# Patient Record
Sex: Male | Born: 1935
Health system: Southern US, Community
[De-identification: ages and names within clinical notes are randomized; demographics above are authoritative.]

## PROBLEM LIST (undated history)

## (undated) DIAGNOSIS — E119 Type 2 diabetes mellitus without complications: Secondary | ICD-10-CM

## (undated) DIAGNOSIS — M1990 Unspecified osteoarthritis, unspecified site: Secondary | ICD-10-CM

## (undated) DIAGNOSIS — R4189 Other symptoms and signs involving cognitive functions and awareness: Secondary | ICD-10-CM

## (undated) DIAGNOSIS — E785 Hyperlipidemia, unspecified: Secondary | ICD-10-CM

## (undated) DIAGNOSIS — I700 Atherosclerosis of aorta: Secondary | ICD-10-CM

## (undated) DIAGNOSIS — I219 Acute myocardial infarction, unspecified: Secondary | ICD-10-CM

## (undated) DIAGNOSIS — I447 Left bundle-branch block, unspecified: Secondary | ICD-10-CM

## (undated) DIAGNOSIS — K219 Gastro-esophageal reflux disease without esophagitis: Secondary | ICD-10-CM

## (undated) DIAGNOSIS — I10 Essential (primary) hypertension: Secondary | ICD-10-CM

## (undated) DIAGNOSIS — J449 Chronic obstructive pulmonary disease, unspecified: Secondary | ICD-10-CM

## (undated) DIAGNOSIS — D509 Iron deficiency anemia, unspecified: Secondary | ICD-10-CM

## (undated) DIAGNOSIS — N183 Chronic kidney disease, stage 3 unspecified: Secondary | ICD-10-CM

## (undated) DIAGNOSIS — I2510 Atherosclerotic heart disease of native coronary artery without angina pectoris: Secondary | ICD-10-CM

## (undated) DIAGNOSIS — I4891 Unspecified atrial fibrillation: Secondary | ICD-10-CM

## (undated) DIAGNOSIS — I4892 Unspecified atrial flutter: Secondary | ICD-10-CM

## (undated) HISTORY — PX: CATARACT EXTRACTION: SUR2

## (undated) HISTORY — PX: CARDIAC CATHETERIZATION: SHX172

## (undated) HISTORY — PX: JOINT REPLACEMENT: SHX530

## (undated) HISTORY — PX: COLONOSCOPY: SHX174

## (undated) HISTORY — PX: EYE SURGERY: SHX253

## (undated) HISTORY — PX: INGUINAL HERNIA REPAIR: SUR1180

---

## 2006-01-18 ENCOUNTER — Inpatient Hospital Stay: Payer: Self-pay | Admitting: Internal Medicine

## 2006-05-29 ENCOUNTER — Other Ambulatory Visit: Payer: Self-pay

## 2006-05-30 ENCOUNTER — Inpatient Hospital Stay: Payer: Self-pay | Admitting: Internal Medicine

## 2007-04-19 ENCOUNTER — Inpatient Hospital Stay: Payer: Self-pay | Admitting: Internal Medicine

## 2007-04-19 ENCOUNTER — Other Ambulatory Visit: Payer: Self-pay

## 2008-02-25 ENCOUNTER — Other Ambulatory Visit: Payer: Self-pay

## 2008-02-25 ENCOUNTER — Ambulatory Visit: Payer: Self-pay | Admitting: Specialist

## 2008-03-03 ENCOUNTER — Inpatient Hospital Stay: Payer: Self-pay | Admitting: Specialist

## 2008-03-30 ENCOUNTER — Encounter: Payer: Self-pay | Admitting: Specialist

## 2009-03-23 ENCOUNTER — Emergency Department: Payer: Self-pay | Admitting: Emergency Medicine

## 2009-06-24 ENCOUNTER — Ambulatory Visit: Payer: Self-pay | Admitting: Gastroenterology

## 2009-10-21 ENCOUNTER — Inpatient Hospital Stay: Payer: Self-pay | Admitting: Internal Medicine

## 2010-04-08 ENCOUNTER — Emergency Department: Payer: Self-pay | Admitting: Unknown Physician Specialty

## 2010-12-18 HISTORY — PX: CORONARY ARTERY BYPASS GRAFT: SHX141

## 2011-03-07 DIAGNOSIS — Z951 Presence of aortocoronary bypass graft: Secondary | ICD-10-CM

## 2011-03-07 HISTORY — DX: Presence of aortocoronary bypass graft: Z95.1

## 2011-04-18 ENCOUNTER — Other Ambulatory Visit: Payer: Self-pay | Admitting: Family Medicine

## 2011-04-18 ENCOUNTER — Emergency Department: Payer: Self-pay

## 2011-05-25 ENCOUNTER — Inpatient Hospital Stay: Payer: Self-pay | Admitting: Internal Medicine

## 2011-09-05 ENCOUNTER — Emergency Department: Payer: Self-pay | Admitting: Emergency Medicine

## 2011-10-17 ENCOUNTER — Other Ambulatory Visit: Payer: Self-pay | Admitting: Gastroenterology

## 2011-10-17 DIAGNOSIS — K625 Hemorrhage of anus and rectum: Secondary | ICD-10-CM

## 2015-01-21 DIAGNOSIS — E1121 Type 2 diabetes mellitus with diabetic nephropathy: Secondary | ICD-10-CM | POA: Diagnosis not present

## 2015-01-21 DIAGNOSIS — I10 Essential (primary) hypertension: Secondary | ICD-10-CM | POA: Diagnosis not present

## 2015-01-28 DIAGNOSIS — E780 Pure hypercholesterolemia: Secondary | ICD-10-CM | POA: Diagnosis not present

## 2015-01-28 DIAGNOSIS — E1121 Type 2 diabetes mellitus with diabetic nephropathy: Secondary | ICD-10-CM | POA: Diagnosis not present

## 2015-01-28 DIAGNOSIS — I10 Essential (primary) hypertension: Secondary | ICD-10-CM | POA: Diagnosis not present

## 2015-01-28 DIAGNOSIS — D649 Anemia, unspecified: Secondary | ICD-10-CM | POA: Diagnosis not present

## 2015-05-10 DIAGNOSIS — E1129 Type 2 diabetes mellitus with other diabetic kidney complication: Secondary | ICD-10-CM | POA: Diagnosis not present

## 2015-05-10 DIAGNOSIS — I10 Essential (primary) hypertension: Secondary | ICD-10-CM | POA: Diagnosis not present

## 2015-05-10 DIAGNOSIS — E780 Pure hypercholesterolemia: Secondary | ICD-10-CM | POA: Diagnosis not present

## 2015-05-10 DIAGNOSIS — D649 Anemia, unspecified: Secondary | ICD-10-CM | POA: Diagnosis not present

## 2015-06-03 DIAGNOSIS — I2510 Atherosclerotic heart disease of native coronary artery without angina pectoris: Secondary | ICD-10-CM | POA: Diagnosis not present

## 2015-06-03 DIAGNOSIS — E1121 Type 2 diabetes mellitus with diabetic nephropathy: Secondary | ICD-10-CM | POA: Diagnosis not present

## 2015-06-03 DIAGNOSIS — I10 Essential (primary) hypertension: Secondary | ICD-10-CM | POA: Diagnosis not present

## 2015-06-03 DIAGNOSIS — I447 Left bundle-branch block, unspecified: Secondary | ICD-10-CM | POA: Diagnosis not present

## 2015-07-26 ENCOUNTER — Encounter: Payer: Self-pay | Admitting: Internal Medicine

## 2015-07-26 ENCOUNTER — Observation Stay
Admission: EM | Admit: 2015-07-26 | Discharge: 2015-07-28 | Disposition: A | Discharge status: Home / Self Care | Payer: Commercial Managed Care - HMO | Attending: Internal Medicine | Admitting: Internal Medicine

## 2015-07-26 DIAGNOSIS — Z951 Presence of aortocoronary bypass graft: Secondary | ICD-10-CM | POA: Diagnosis not present

## 2015-07-26 DIAGNOSIS — I447 Left bundle-branch block, unspecified: Secondary | ICD-10-CM | POA: Insufficient documentation

## 2015-07-26 DIAGNOSIS — Z96653 Presence of artificial knee joint, bilateral: Secondary | ICD-10-CM | POA: Diagnosis not present

## 2015-07-26 DIAGNOSIS — E785 Hyperlipidemia, unspecified: Secondary | ICD-10-CM | POA: Diagnosis not present

## 2015-07-26 DIAGNOSIS — K922 Gastrointestinal hemorrhage, unspecified: Secondary | ICD-10-CM | POA: Insufficient documentation

## 2015-07-26 DIAGNOSIS — Z87891 Personal history of nicotine dependence: Secondary | ICD-10-CM | POA: Diagnosis not present

## 2015-07-26 DIAGNOSIS — Z79899 Other long term (current) drug therapy: Secondary | ICD-10-CM | POA: Diagnosis not present

## 2015-07-26 DIAGNOSIS — I2510 Atherosclerotic heart disease of native coronary artery without angina pectoris: Secondary | ICD-10-CM | POA: Diagnosis not present

## 2015-07-26 DIAGNOSIS — Z7982 Long term (current) use of aspirin: Secondary | ICD-10-CM | POA: Insufficient documentation

## 2015-07-26 DIAGNOSIS — M1990 Unspecified osteoarthritis, unspecified site: Secondary | ICD-10-CM | POA: Insufficient documentation

## 2015-07-26 DIAGNOSIS — Z9849 Cataract extraction status, unspecified eye: Secondary | ICD-10-CM | POA: Diagnosis not present

## 2015-07-26 DIAGNOSIS — E118 Type 2 diabetes mellitus with unspecified complications: Secondary | ICD-10-CM

## 2015-07-26 DIAGNOSIS — E119 Type 2 diabetes mellitus without complications: Secondary | ICD-10-CM | POA: Insufficient documentation

## 2015-07-26 DIAGNOSIS — K648 Other hemorrhoids: Secondary | ICD-10-CM | POA: Diagnosis not present

## 2015-07-26 DIAGNOSIS — Z9889 Other specified postprocedural states: Secondary | ICD-10-CM | POA: Diagnosis not present

## 2015-07-26 DIAGNOSIS — E784 Other hyperlipidemia: Secondary | ICD-10-CM | POA: Diagnosis not present

## 2015-07-26 DIAGNOSIS — K921 Melena: Principal | ICD-10-CM | POA: Diagnosis present

## 2015-07-26 DIAGNOSIS — I10 Essential (primary) hypertension: Secondary | ICD-10-CM | POA: Diagnosis present

## 2015-07-26 HISTORY — DX: Left bundle-branch block, unspecified: I44.7

## 2015-07-26 HISTORY — DX: Hyperlipidemia, unspecified: E78.5

## 2015-07-26 HISTORY — DX: Atherosclerotic heart disease of native coronary artery without angina pectoris: I25.10

## 2015-07-26 HISTORY — DX: Essential (primary) hypertension: I10

## 2015-07-26 HISTORY — DX: Type 2 diabetes mellitus without complications: E11.9

## 2015-07-26 HISTORY — DX: Unspecified osteoarthritis, unspecified site: M19.90

## 2015-07-26 LAB — CBC
HCT: 35.8 % — ABNORMAL LOW (ref 40.0–52.0)
Hemoglobin: 11.8 g/dL — ABNORMAL LOW (ref 13.0–18.0)
MCH: 30.4 pg (ref 26.0–34.0)
MCHC: 33.1 g/dL (ref 32.0–36.0)
MCV: 91.9 fL (ref 80.0–100.0)
PLATELETS: 201 10*3/uL (ref 150–440)
RBC: 3.89 MIL/uL — ABNORMAL LOW (ref 4.40–5.90)
RDW: 14 % (ref 11.5–14.5)
WBC: 7.9 10*3/uL (ref 3.8–10.6)

## 2015-07-26 LAB — TYPE AND SCREEN
ABO/RH(D): O POS
Antibody Screen: NEGATIVE

## 2015-07-26 LAB — COMPREHENSIVE METABOLIC PANEL
ALT: 11 U/L — ABNORMAL LOW (ref 17–63)
ANION GAP: 10 (ref 5–15)
AST: 17 U/L (ref 15–41)
Albumin: 4.7 g/dL (ref 3.5–5.0)
Alkaline Phosphatase: 46 U/L (ref 38–126)
BUN: 23 mg/dL — AB (ref 6–20)
CHLORIDE: 102 mmol/L (ref 101–111)
CO2: 29 mmol/L (ref 22–32)
Calcium: 9.3 mg/dL (ref 8.9–10.3)
Creatinine, Ser: 1.09 mg/dL (ref 0.61–1.24)
GFR calc Af Amer: >60 mL/min (ref >60)
GFR calc non Af Amer: >60 mL/min (ref >60)
GLUCOSE: 121 mg/dL — AB (ref 65–99)
Potassium: 3.9 mmol/L (ref 3.5–5.1)
SODIUM: 141 mmol/L (ref 135–145)
TOTAL PROTEIN: 7.7 g/dL (ref 6.5–8.1)
Total Bilirubin: 0.2 mg/dL — ABNORMAL LOW (ref 0.3–1.2)

## 2015-07-26 LAB — GLUCOSE, CAPILLARY: GLUCOSE-CAPILLARY: 116 mg/dL — AB (ref 65–99)

## 2015-07-26 MED ORDER — ACETAMINOPHEN 650 MG RE SUPP
650.0000 mg | Freq: Four times a day (QID) | RECTAL | Status: DC | PRN
Start: 2015-07-26 — End: 2015-07-28

## 2015-07-26 MED ORDER — ONDANSETRON HCL 4 MG/2ML IJ SOLN
4.0000 mg | Freq: Four times a day (QID) | INTRAMUSCULAR | Status: DC | PRN
Start: 2015-07-26 — End: 2015-07-28

## 2015-07-26 MED ORDER — ONDANSETRON HCL 4 MG PO TABS
4.0000 mg | ORAL_TABLET | Freq: Four times a day (QID) | ORAL | Status: DC | PRN
Start: 2015-07-26 — End: 2015-07-28

## 2015-07-26 MED ORDER — INSULIN ASPART 100 UNIT/ML ~~LOC~~ SOLN
0.0000 [IU] | Freq: Four times a day (QID) | SUBCUTANEOUS | Status: DC
  Administered 2015-07-27: 1 [IU] via SUBCUTANEOUS
  Administered 2015-07-28: 3 [IU] via SUBCUTANEOUS
  Filled 2015-07-26: qty 1
  Filled 2015-07-26: qty 3

## 2015-07-26 MED ORDER — PANTOPRAZOLE SODIUM 40 MG IV SOLR
40.0000 mg | Freq: Two times a day (BID) | INTRAVENOUS | Status: DC
  Administered 2015-07-26 – 2015-07-28 (×4): 40 mg via INTRAVENOUS
  Filled 2015-07-26 (×4): qty 40

## 2015-07-26 MED ORDER — SODIUM CHLORIDE 0.9 % IV SOLN
INTRAVENOUS | Status: AC
  Administered 2015-07-26: 23:00:00 via INTRAVENOUS

## 2015-07-26 MED ORDER — ACETAMINOPHEN 325 MG PO TABS: 650.0000 mg | ORAL_TABLET | Freq: Four times a day (QID) | ORAL | Status: DC | PRN

## 2015-07-26 NOTE — H&P (Signed)
[facility] [place] NAME: Michael Simon    MR#:  [id number]  DATE OF BIRTH:  04/14/1933  DATE OF ADMISSION:  07/26/2015  PRIMARY CARE PHYSICIAN: Glendon Axe, MD   REQUESTING/REFERRING PHYSICIAN: Clearnce Hasten, MD  CHIEF COMPLAINT:   Chief Complaint  Patient presents with  . Rectal Bleeding    HISTORY OF PRESENT ILLNESS:  Michael Simon  is a 81 y.o. male who presents with hematochezia. Patient states that he had an episode of a small amount of red blood per rectum yesterday, followed by a large bowel movement today of mixed stool and bright red blood. He denies abdominal pain, fever/chills, nausea/vomiting, and states that his diarrhea just started today. He denies any prior diagnosis of diverticulosis. He denies any other symptoms like lightheadedness, see full review of systems below. He did not try and take anything for this problem, and came straight to the ED for evaluation. In the ED he was found to have a stable hemoglobin, vital signs stable, hemodynamically stable. Hospitals were called for admission for hematochezia.  PAST MEDICAL HISTORY:   Past Medical History  Diagnosis Date  . CAD (coronary artery disease)   . Type II diabetes mellitus   . HTN (hypertension)   . HLD (hyperlipidemia)   . Osteoarthritis   . LBBB (left bundle branch block)     PAST SURGICAL HISTORY:   Past Surgical History  Procedure Laterality Date  . Joint replacement      knees  . Coronary artery bypass graft  2012  . Cataract extraction    . Inguinal hernia repair      SOCIAL HISTORY:   History  Substance Use Topics  . Smoking status: Former Smoker    Quit date: 03/03/2008  . Smokeless tobacco: Not on file  . Alcohol Use: No    FAMILY HISTORY:   Family History  Problem Relation Age of Onset  . Arthritis Mother   . Heart disease Mother   . Heart disease Other     DRUG ALLERGIES:  No Known Allergies  MEDICATIONS AT  HOME:   Prior to Admission medications   Medication Sig Start Date End Date Taking? Authorizing Provider  aspirin EC 81 MG tablet Take 81 mg by mouth daily.   Yes Historical Provider, MD  Cyanocobalamin (VITAMIN B-12 CR) 1500 MCG TBCR Take 1 tablet by mouth daily.   Yes Historical Provider, MD  iron polysaccharides (NIFEREX) 150 MG capsule Take 150 mg by mouth daily.   Yes Historical Provider, MD  lisinopril (PRINIVIL,ZESTRIL) 20 MG tablet Take 20 mg by mouth daily.   Yes Historical Provider, MD  lovastatin (MEVACOR) 40 MG tablet Take 40 mg by mouth at bedtime.   Yes Historical Provider, MD  metFORMIN (GLUCOPHAGE-XR) 500 MG 24 hr tablet Take 1,000 mg by mouth 2 (two) times daily.    Yes Historical Provider, MD  metoprolol tartrate (LOPRESSOR) 25 MG tablet Take 12.5 mg by mouth 2 (two) times daily.   Yes Historical Provider, MD    REVIEW OF SYSTEMS:  Review of Systems  Constitutional: Negative for fever, chills, weight loss and malaise/fatigue.  HENT: Negative for ear pain, hearing loss and tinnitus.   Eyes: Negative for blurred vision, double vision, pain and redness.  Respiratory: Negative for cough, hemoptysis and shortness of breath.   Cardiovascular: Negative for chest pain, palpitations, orthopnea and leg swelling.  Gastrointestinal: Positive for diarrhea and blood in stool. Negative for nausea, vomiting, abdominal pain and  constipation.  Genitourinary: Negative for dysuria, frequency and hematuria.  Musculoskeletal: Negative for back pain, joint pain and neck pain.  Skin:       No acne, rash, or lesions  Neurological: Negative for dizziness, tremors, focal weakness and weakness.  Endo/Heme/Allergies: Negative for polydipsia. Does not bruise/bleed easily.  Psychiatric/Behavioral: Negative for depression. The patient is not nervous/anxious and does not have insomnia.      VITAL SIGNS:   Filed Vitals:   07/26/15 1953  BP: 119/72  Pulse: 81  Temp: 98.2 F (36.8 C)  TempSrc:  Oral  Resp: 18  Height: 5\' 9"  (1.753 m)  Weight: 77.111 kg (170 lb)  SpO2: 97%   Wt Readings from Last 3 Encounters:  07/26/15 77.111 kg (170 lb)    PHYSICAL EXAMINATION:  Physical Exam  Vitals reviewed. Constitutional: He is oriented to person, place, and time. He appears well-developed and well-nourished. No distress.  HENT:  Head: Normocephalic and atraumatic.  Mouth/Throat: Oropharynx is clear and moist.  Eyes: Conjunctivae and EOM are normal. Pupils are equal, round, and reactive to light. No scleral icterus.  Neck: Normal range of motion. Neck supple. No JVD present. No thyromegaly present.  Cardiovascular: Normal rate, regular rhythm and intact distal pulses.  Exam reveals no gallop and no friction rub.   No murmur heard. Respiratory: Effort normal and breath sounds normal. No respiratory distress. He has no wheezes. He has no rales.  GI: Soft. Bowel sounds are normal. He exhibits no distension. There is no tenderness.  Musculoskeletal: Normal range of motion. He exhibits no edema.  No arthritis, no gout  Lymphadenopathy:    He has no cervical adenopathy.  Neurological: He is alert and oriented to person, place, and time. No cranial nerve deficit.  No dysarthria, no aphasia  Skin: Skin is warm and dry. No rash noted. No erythema.  Psychiatric: He has a normal mood and affect. His behavior is normal. Judgment and thought content normal.    LABORATORY PANEL:   CBC  Recent Labs Lab 07/26/15 1956  WBC 7.9  HGB 11.8*  HCT 35.8*  PLT 201   ------------------------------------------------------------------------------------------------------------------  Chemistries   Recent Labs Lab 07/26/15 1956  NA 141  K 3.9  CL 102  CO2 29  GLUCOSE 121*  BUN 23*  CREATININE 1.09  CALCIUM 9.3  AST 17  ALT 11*  ALKPHOS 46  BILITOT 0.2*   ------------------------------------------------------------------------------------------------------------------  Cardiac  Enzymes No results for input(s): TROPONINI in the last 168 hours. ------------------------------------------------------------------------------------------------------------------  RADIOLOGY:  No results found.  EKG:   Orders placed or performed in visit on 04/18/11  . EKG 12-Lead    IMPRESSION AND PLAN:  Principal Problem:   Hematochezia - unclear source, though suspect lower GI bleed. Hemodynamically stable, hemoglobin stable. We'll get a GI consult for the morning, keep him nothing by mouth at this time, give him twice a day IV PPI. Active Problems:   Diabetes mellitus type II, controlled - hold his home oral anti-glycemic medication, and implement sliding scale insulin with appropriate corresponding glucose checks every 6 hours while nothing by mouth.   HTN (hypertension) - holding home by mouth antihypertensives this patient is nothing by mouth for now, blood pressure is controlled at this time, can use IV when necessary antihypertensives if this becomes necessary.   HLD (hyperlipidemia) - holding home meds at this time, will restart statin once patient is taking by mouth again   CAD (coronary artery disease) - restart home by mouth medicines when patient  is eating  All the records are reviewed and case discussed with ED provider. Management plans discussed with the patient and/or family.  DVT PROPHYLAXIS: mechanical only  ADMISSION STATUS: Observation  CODE STATUS: full  TOTAL TIME TAKING CARE OF THIS PATIENT: 40 minutes.    Michael Simon 07/26/2015, 9:23 PM  [facility]  Office  [phone]  CC: Primary care physician; Teodoro Spray., MD

## 2015-07-26 NOTE — ED Provider Notes (Addendum)
[facility] Emergency Department Provider Note  ____________________________________________  Time seen: Approximately 8:40 PM  I have reviewed the triage vital signs and the nursing notes.   HISTORY  Chief Complaint Rectal Bleeding    HPI Michael Simon is a 81 y.o. male with a history of coronary artery disease and type 2 diabetes who presents today with 2 days of blood in his stool. He said he had one episode yesterday and then another, very large episode of bleeding about 2 hours ago today. He has not had any pain, shortness of breath, nausea vomiting. He has never been diagnosed with diverticulosis or diverticulitis.   Past Medical History  Diagnosis Date  . CAD (coronary artery disease)   . Type II diabetes mellitus   . HTN (hypertension)   . HLD (hyperlipidemia)   . Osteoarthritis   . LBBB (left bundle branch block)     There are no active problems to display for this patient.   No past surgical history on file.  No current outpatient prescriptions on file.  Allergies Review of patient's allergies indicates no known allergies.  No family history on file.  Social History History  Substance Use Topics  . Smoking status: Not on file  . Smokeless tobacco: Not on file  . Alcohol Use: Not on file    Review of Systems Constitutional: No fever/chills Eyes: No visual changes. ENT: No sore throat. Cardiovascular: Denies chest pain. Respiratory: Denies shortness of breath. Gastrointestinal: No abdominal pain.  No nausea, no vomiting.   No constipation. Genitourinary: Negative for dysuria. Musculoskeletal: Negative for back pain. Skin: Negative for rash. Neurological: Negative for headaches, focal weakness or numbness.  10-point ROS otherwise negative.  ____________________________________________   PHYSICAL EXAM:  VITAL SIGNS: ED Triage Vitals  Enc Vitals Group     BP 07/26/15 1953 119/72 mmHg     Pulse Rate 07/26/15 1953  81     Resp 07/26/15 1953 18     Temp 07/26/15 1953 98.2 F (36.8 C)     Temp Source 07/26/15 1953 Oral     SpO2 07/26/15 1953 97 %     Weight 07/26/15 1953 170 lb (77.111 kg)     Height 07/26/15 1953 5\' 9"  (1.753 m)     Head Cir --      Peak Flow --      Pain Score --      Pain Loc --      Pain Edu? --      Excl. in [place]? --     Constitutional: Alert and oriented. Well appearing and in no acute distress. Eyes: Conjunctivae are normal. PERRL. EOMI. Head: Atraumatic. Nose: No congestion/rhinnorhea. Mouth/Throat: Mucous membranes are moist.  Oropharynx non-erythematous. Neck: No stridor.   Cardiovascular: Normal rate, regular rhythm. Grossly normal heart sounds.  Good peripheral circulation. Respiratory: Normal respiratory effort.  No retractions. Lungs CTAB. Gastrointestinal: Soft and nontender. No distention. No abdominal bruits. No CVA tenderness. Hematochezia on rectal exam. Musculoskeletal: No lower extremity tenderness nor edema.  No joint effusions. Neurologic:  Normal speech and language. No gross focal neurologic deficits are appreciated. No gait instability. Skin:  Skin is warm, dry and intact. No rash noted. Psychiatric: Mood and affect are normal. Speech and behavior are normal.  ____________________________________________   LABS (all labs ordered are listed, but only abnormal results are displayed)  Labs Reviewed  COMPREHENSIVE METABOLIC PANEL - Abnormal; Notable for the following:    Glucose, Bld 121 (*)    BUN 23 (*)  ALT 11 (*)    Total Bilirubin 0.2 (*)    All other components within normal limits  CBC - Abnormal; Notable for the following:    RBC 3.89 (*)    Hemoglobin 11.8 (*)    HCT 35.8 (*)    All other components within normal limits  TYPE AND SCREEN    ____________________________________________  EKG   ____________________________________________  RADIOLOGY   ____________________________________________   PROCEDURES    ____________________________________________   INITIAL IMPRESSION / ASSESSMENT AND PLAN / ED COURSE  Pertinent labs & imaging results that were available during my care of the patient were reviewed by me and considered in my medical decision making (see chart for details).  Given age as well as hematochezia, suspect that this is a diverticular bleed. We'll admit to the hospital. Unlikely active bleeding given only one episode today 2 hours ago. Signed out to Dr. Jannifer Franklin. ____________________________________________   FINAL CLINICAL IMPRESSION(S) / ED DIAGNOSES  Acute GI bleed. Initial visit.    Orbie Pyo, MD 07/26/15 2052  Only anticoagulant is aspirin.  Orbie Pyo, MD 07/26/15 2053

## 2015-07-26 NOTE — ED Notes (Addendum)
Patient ambulatory to triage with steady gait, without difficulty or distress noted; pt reports rectal bleeding since yesterday (x 1 yesterday, x 1 today); denies hx of same; denies abd pain

## 2015-07-27 DIAGNOSIS — I2510 Atherosclerotic heart disease of native coronary artery without angina pectoris: Secondary | ICD-10-CM | POA: Diagnosis not present

## 2015-07-27 DIAGNOSIS — E119 Type 2 diabetes mellitus without complications: Secondary | ICD-10-CM | POA: Diagnosis not present

## 2015-07-27 DIAGNOSIS — K921 Melena: Secondary | ICD-10-CM | POA: Diagnosis not present

## 2015-07-27 DIAGNOSIS — Z7982 Long term (current) use of aspirin: Secondary | ICD-10-CM | POA: Diagnosis not present

## 2015-07-27 DIAGNOSIS — K648 Other hemorrhoids: Secondary | ICD-10-CM | POA: Diagnosis not present

## 2015-07-27 DIAGNOSIS — K922 Gastrointestinal hemorrhage, unspecified: Secondary | ICD-10-CM | POA: Diagnosis not present

## 2015-07-27 DIAGNOSIS — E785 Hyperlipidemia, unspecified: Secondary | ICD-10-CM | POA: Diagnosis not present

## 2015-07-27 DIAGNOSIS — I10 Essential (primary) hypertension: Secondary | ICD-10-CM | POA: Diagnosis not present

## 2015-07-27 DIAGNOSIS — Z87891 Personal history of nicotine dependence: Secondary | ICD-10-CM | POA: Diagnosis not present

## 2015-07-27 DIAGNOSIS — K5730 Diverticulosis of large intestine without perforation or abscess without bleeding: Secondary | ICD-10-CM | POA: Diagnosis not present

## 2015-07-27 LAB — BASIC METABOLIC PANEL
Anion gap: 6 (ref 5–15)
BUN: 21 mg/dL — ABNORMAL HIGH (ref 6–20)
CHLORIDE: 106 mmol/L (ref 101–111)
CO2: 29 mmol/L (ref 22–32)
Calcium: 8.7 mg/dL — ABNORMAL LOW (ref 8.9–10.3)
Creatinine, Ser: 1.04 mg/dL (ref 0.61–1.24)
GFR calc non Af Amer: >60 mL/min (ref >60)
GLUCOSE: 126 mg/dL — AB (ref 65–99)
Potassium: 3.8 mmol/L (ref 3.5–5.1)
Sodium: 141 mmol/L (ref 135–145)

## 2015-07-27 LAB — CBC
HEMATOCRIT: 32.5 % — AB (ref 40.0–52.0)
Hemoglobin: 10.6 g/dL — ABNORMAL LOW (ref 13.0–18.0)
MCH: 30.1 pg (ref 26.0–34.0)
MCHC: 32.7 g/dL (ref 32.0–36.0)
MCV: 91.8 fL (ref 80.0–100.0)
Platelets: 169 10*3/uL (ref 150–440)
RBC: 3.54 MIL/uL — ABNORMAL LOW (ref 4.40–5.90)
RDW: 14 % (ref 11.5–14.5)
WBC: 5.4 10*3/uL (ref 3.8–10.6)

## 2015-07-27 LAB — ABO/RH: ABO/RH(D): O POS

## 2015-07-27 LAB — GLUCOSE, CAPILLARY
GLUCOSE-CAPILLARY: 100 mg/dL — AB (ref 65–99)
GLUCOSE-CAPILLARY: 185 mg/dL — AB (ref 65–99)
GLUCOSE-CAPILLARY: 87 mg/dL (ref 65–99)
Glucose-Capillary: 137 mg/dL — ABNORMAL HIGH (ref 65–99)

## 2015-07-27 LAB — HEMOGLOBIN: Hemoglobin: 10.8 g/dL — ABNORMAL LOW (ref 13.0–18.0)

## 2015-07-27 NOTE — Consult Note (Signed)
GI Inpatient Consult Note  Reason for Consult: Hematochezia   Attending Requesting Consult: Willis  History of Present Illness: Michael Simon is a 81 y.o. male with a h/o CAD and DM type 2 admitted for hematochezia.  He presented to the [facility] ED with BRB in stool x 2 days, with one "very large" episode last evening.  Hematochezia was also noted on rectal exam.  He denied lightheadedness, dizziness, abdominal pain, CP, SOB, and n/v.  Patient did endorse diarrhea.  In the ED, Hgb and vitals were stable.  He was started on IV Protonix 40mg  q 12 hrs, made NPO, and admitted for further evaluation.  Patient has experienced BRBPR before, evaluated twice by Dr. Gustavo Lah and Dr. Vira Agar after hospitalization in 05/2011 for anemia/hematochezia. Hgb rebounded quickly and symptoms resolved so thought to be diverticular on both occasions.  Today, Michael Simon reports improvement in hematochezia.  He reports multiple BMs today, each with less BRB than the previous.  He denies any further episodes of significant hematochezia, like what prompted him to go to the ED last night. He denies any other symptoms including abdominal pain, nausea, vomiting, diarrhea, or constipation.  His appetite and weight are stable, and he does not use NSAIDs, EtOH, or tobacco.  Patient notes that this episode seems similar to what he experienced in 2012 when he saw Dr. Vira Agar and Gustavo Lah, which seemed to be related to diverticulosis.  He states he feels well and would be happy to follow-up outpatient with the improvement he has seen today.  Last EGD: none prior Last colonoscopy: 06/24/09 - diverticulosis, internal hemorrhoids   Past Medical History:  Past Medical History  Diagnosis Date  . CAD (coronary artery disease)   . Type II diabetes mellitus   . HTN (hypertension)   . HLD (hyperlipidemia)   . Osteoarthritis   . LBBB (left bundle branch block)     Problem List: Patient Active Problem List   Diagnosis Date Noted   . Hematochezia 07/26/2015  . Diabetes mellitus type II, controlled 07/26/2015  . HTN (hypertension) 07/26/2015  . HLD (hyperlipidemia) 07/26/2015  . CAD (coronary artery disease) 07/26/2015    Past Surgical History: Past Surgical History  Procedure Laterality Date  . Joint replacement      knees  . Coronary artery bypass graft  2012  . Cataract extraction    . Inguinal hernia repair      Allergies: No Known Allergies  Home Medications: Prescriptions prior to admission  Medication Sig Dispense Refill Last Dose  . aspirin EC 81 MG tablet Take 81 mg by mouth daily.   07/26/2015 at Unknown time  . Cyanocobalamin (VITAMIN B-12 CR) 1500 MCG TBCR Take 1 tablet by mouth daily.   07/26/2015 at Unknown time  . iron polysaccharides (NIFEREX) 150 MG capsule Take 150 mg by mouth daily.   07/26/2015 at Unknown time  . lisinopril (PRINIVIL,ZESTRIL) 20 MG tablet Take 20 mg by mouth daily.   07/26/2015 at Unknown time  . lovastatin (MEVACOR) 40 MG tablet Take 40 mg by mouth at bedtime.   07/25/2015 at Unknown time  . metFORMIN (GLUCOPHAGE-XR) 500 MG 24 hr tablet Take 1,000 mg by mouth 2 (two) times daily.    07/26/2015 at Unknown time  . metoprolol tartrate (LOPRESSOR) 25 MG tablet Take 12.5 mg by mouth 2 (two) times daily.   07/26/2015 at 0600   Home medication reconciliation was completed with the patient.   Scheduled Inpatient Medications:   . insulin aspart  0-9 Units  Subcutaneous Q6H  . pantoprazole (PROTONIX) IV  40 mg Intravenous Q12H    Continuous Inpatient Infusions:     PRN Inpatient Medications:  acetaminophen **OR** acetaminophen, ondansetron **OR** ondansetron (ZOFRAN) IV  Family History: family history includes Arthritis in his mother; Heart disease in his mother and other.    Social History:   reports that he quit smoking about 7 years ago. His smokeless tobacco use includes Chew. He reports that he does not drink alcohol or use illicit drugs.   Review of Systems: Constitutional:  Weight is stable.  Eyes: No changes in vision. ENT: No oral lesions, sore throat.  GI: see HPI.  Heme/Lymph: No easy bruising.  CV: No chest pain.  GU: No hematuria.  Integumentary: No rashes.  Neuro: No headaches.  Psych: No depression/anxiety.  Endocrine: No heat/cold intolerance.  Allergic/Immunologic: No urticaria.  Resp: No cough, SOB.  Musculoskeletal: No joint swelling.    Physical Examination: BP 150/61 mmHg  Pulse 62  Temp(Src) 98.6 F (37 C) (Oral)  Resp 18  Ht 5\' 9"  (1.753 m)  Wt 73.12 kg (161 lb 3.2 oz)  BMI 23.79 kg/m2  SpO2 96% Gen: NAD, alert and oriented x 4 HEENT: PEERLA, EOMI, Neck: supple, no JVD or thyromegaly Chest: CTA bilaterally, no wheezes, crackles, or other adventitious sounds CV: RRR, no m/g/c/r Abd: soft, NT, ND, +BS in all four quadrants; no HSM, guarding, ridigity, or rebound tenderness Ext: no edema, well perfused with 2+ pulses, Skin: no rash or lesions noted Lymph: no LAD  Data: Lab Results  Component Value Date   WBC 5.4 07/27/2015   HGB 10.6* 07/27/2015   HCT 32.5* 07/27/2015   MCV 91.8 07/27/2015   PLT 169 07/27/2015    Recent Labs Lab 07/26/15 1956 07/27/15 0054 07/27/15 0442  HGB 11.8* 10.8* 10.6*   Lab Results  Component Value Date   NA 141 07/27/2015   K 3.8 07/27/2015   CL 106 07/27/2015   CO2 29 07/27/2015   BUN 21* 07/27/2015   CREATININE 1.04 07/27/2015   Lab Results  Component Value Date   ALT 11* 07/26/2015   AST 17 07/26/2015   ALKPHOS 46 07/26/2015   BILITOT 0.2* 07/26/2015   No results for input(s): APTT, INR, PTT in the last 168 hours. Assessment/Plan: Michael Simon is a 81 y.o. male with a h/o CAD and DM type 2 admitted for hematochezia.  He has a history of episodic BRBPR, last colonoscopy in 2010 showed diverticulosis. Patient reports decreasing hematochezia with each BM and remains hemodynamically stable.  Last Hgb 10.6, 11.8 on admission but patient endorses large volume of BRBPR just before  going to the ED last night.  If patient continues to improve symptomatically and vitals/Hgb remain stable, I feel comfortable recommending outpatient follow-up for colonoscopy.  It has been 6 years since his last colonoscopy, so it is reasonable to r/o malignancy and re-evaluate internal hemorrhoids.  Recommendations:  1. Lower GI Bleed - Follow-up with [facility] for outpatient colonoscopy - Continue NPO  - Follow Hgb and hemodynamics closely, transfuse if Hgb > 7 - D/c IV Protonix  Thank you for the consult. We will follow along with you. Please call with questions or concerns.  Lavera Guise, PA-C [facility] Phone: [phone] Pager: [phone]

## 2015-07-27 NOTE — Plan of Care (Signed)
Problem: Discharge Progression Outcomes Goal: Discharge plan in place and appropriate Individualization of care  Outcome: Progressing Individualization of Care Pt prefers to be called Michael Simon Hx of CAD, DM2, HTN, HLD, Osteoarthritis, LBB     Goal: Other Discharge Outcomes/Goals 1. Discharge Plan:  Plan to discharge tomorrow 8/10 if Hgb stable and no further bleeding episodes.   2.  Pain:  Patient denies pain this shift. 3.  Hemodynamics:  Patient afebrile and vss this shift. BP 134/55 mmHg  Pulse 52  Temp(Src) 97.7 F (36.5 C) (Oral)  Resp 20  Ht 5\' 9"  (1.753 m)  Wt 161 lb 3.2 oz (73.12 kg)  BMI 23.79 kg/m2  SpO2 100% 4.  Complications:  Patient NPO with CBG of 87 this shift.  Novolog not given at hs per sliding scale. 5.  Diet:  Patient previously on liquid diet and tolerated well.  Currently NPO and denies hunger or N/V. 6.  Activity:  Patient in chair and then bed this shift.  Able to walk with steady gait and 1 person assist.

## 2015-07-27 NOTE — Plan of Care (Signed)
Problem: Discharge Progression Outcomes Goal: Discharge plan in place and appropriate Individualization of care  Lives at home. Has history of diabetes, HTN and HLD controlled by home medications. On moderate fall precautions per policy.    Goal: Other Discharge Outcomes/Goals Plan of care progress to goal for: rectal bleed - Continues on IV fluids. - Continues IV protonix. - Assist to BR. - No complaints of pain. Will continue to monitor.

## 2015-07-27 NOTE — Progress Notes (Signed)
I have seen and examined Michael Simon and agree with Sharyn Lull Johnson's a/p.   The bleed has now resolved.  This was likely diverticular bleed.   [place] for discharge if Hgb stable in am and on further bleeding.   Will have him f/u in GI clinic and will likely schedule colonoscopy at that time since his last colonoscopy was 5 years ago.

## 2015-07-27 NOTE — Progress Notes (Signed)
Subjective: Admitted for bright red bleeding per rectum. No further episodes of bleeding per rectum since admission. The complaint of abdominal pain, nausea or vomiting.  Objective: Vital signs in last 24 hours: Temp:  [98.1 F (36.7 C)-98.6 F (37 C)] 98.1 F (36.7 C) (08/09 1252) Pulse Rate:  [58-81] 58 (08/09 1252) Resp:  [16-18] 18 (08/09 0452) BP: (119-150)/(61-72) 129/61 mmHg (08/09 1252) SpO2:  [96 %-100 %] 100 % (08/09 1252) Weight:  [73.12 kg (161 lb 3.2 oz)-77.111 kg (170 lb)] 73.12 kg (161 lb 3.2 oz) (08/08 2239) Weight change:  Last BM Date: 07/26/15  Intake/Output from previous day: 08/08 0701 - 08/09 0700 In: 481.3 [I.V.:481.3] Out: -  Intake/Output this shift:    Exam: General: alert, oriented x 3, NAD HENT: no JVD, no pallor, no icterus Chest: clear to auscultation CVS: RRR, S1S2, no tachycardia. Abdomen: soft, nontender, no hepatosplenomegaly Ext: no lower leg edema.  Neuro: Moving all extremities.  Lab Results:  Recent Labs  07/26/15 1956 07/27/15 0054 07/27/15 0442  WBC 7.9  --  5.4  HGB 11.8* 10.8* 10.6*  HCT 35.8*  --  32.5*  PLT 201  --  169   BMET  Recent Labs  07/26/15 1956 07/27/15 0442  NA 141 141  K 3.9 3.8  CL 102 106  CO2 29 29  GLUCOSE 121* 126*  BUN 23* 21*  CREATININE 1.09 1.04  CALCIUM 9.3 8.7*    Studies/Results: No results found.  Medications:  Scheduled Meds: . insulin aspart  0-9 Units Subcutaneous Q6H  . pantoprazole (PROTONIX) IV  40 mg Intravenous Q12H   Continuous Infusions:  PRN Meds:.acetaminophen **OR** acetaminophen, ondansetron **OR** ondansetron (ZOFRAN) IV   Assessment/Plan: Lower GI bleed: monitor for active bleeding, repeat hemoglobin and hematocrit, GI evaluation, continue IV Protonix, start clear liquid diet. Type 2 diabetes: Continue sliding scale insulin coverage. Hold oral hypoglycemic for now. Hypertension is controlled.   Alasia Enge 07/27/2015, 2:33 PM

## 2015-07-28 DIAGNOSIS — Z87891 Personal history of nicotine dependence: Secondary | ICD-10-CM | POA: Diagnosis not present

## 2015-07-28 DIAGNOSIS — E785 Hyperlipidemia, unspecified: Secondary | ICD-10-CM | POA: Diagnosis not present

## 2015-07-28 DIAGNOSIS — K922 Gastrointestinal hemorrhage, unspecified: Secondary | ICD-10-CM | POA: Diagnosis not present

## 2015-07-28 DIAGNOSIS — K921 Melena: Secondary | ICD-10-CM | POA: Diagnosis not present

## 2015-07-28 DIAGNOSIS — Z7982 Long term (current) use of aspirin: Secondary | ICD-10-CM | POA: Diagnosis not present

## 2015-07-28 DIAGNOSIS — K648 Other hemorrhoids: Secondary | ICD-10-CM | POA: Diagnosis not present

## 2015-07-28 DIAGNOSIS — I10 Essential (primary) hypertension: Secondary | ICD-10-CM | POA: Diagnosis not present

## 2015-07-28 DIAGNOSIS — E119 Type 2 diabetes mellitus without complications: Secondary | ICD-10-CM | POA: Diagnosis not present

## 2015-07-28 DIAGNOSIS — I2510 Atherosclerotic heart disease of native coronary artery without angina pectoris: Secondary | ICD-10-CM | POA: Diagnosis not present

## 2015-07-28 LAB — GLUCOSE, CAPILLARY
GLUCOSE-CAPILLARY: 117 mg/dL — AB (ref 65–99)
GLUCOSE-CAPILLARY: 104 mg/dL — AB (ref 65–99)
GLUCOSE-CAPILLARY: 207 mg/dL — AB (ref 65–99)

## 2015-07-28 LAB — CBC WITH DIFFERENTIAL/PLATELET
Basophils Absolute: 0.1 10*3/uL (ref 0–0.1)
Basophils Relative: 1 %
EOS PCT: 6 %
Eosinophils Absolute: 0.3 10*3/uL (ref 0–0.7)
HCT: 33.7 % — ABNORMAL LOW (ref 40.0–52.0)
HEMOGLOBIN: 11.1 g/dL — AB (ref 13.0–18.0)
Lymphocytes Relative: 24 %
Lymphs Abs: 1.3 10*3/uL (ref 1.0–3.6)
MCH: 30.1 pg (ref 26.0–34.0)
MCHC: 33 g/dL (ref 32.0–36.0)
MCV: 91.2 fL (ref 80.0–100.0)
MONOS PCT: 8 %
Monocytes Absolute: 0.4 10*3/uL (ref 0.2–1.0)
NEUTROS ABS: 3.3 10*3/uL (ref 1.4–6.5)
NEUTROS PCT: 61 %
PLATELETS: 174 10*3/uL (ref 150–440)
RBC: 3.69 MIL/uL — ABNORMAL LOW (ref 4.40–5.90)
RDW: 13.9 % (ref 11.5–14.5)
WBC: 5.4 10*3/uL (ref 3.8–10.6)

## 2015-07-28 MED ORDER — PANTOPRAZOLE SODIUM 40 MG PO TBEC
40.0000 mg | DELAYED_RELEASE_TABLET | Freq: Every day | ORAL | Status: DC
Start: 2015-07-28 — End: 2020-08-10

## 2015-07-28 NOTE — Discharge Summary (Signed)
Physician Discharge Summary  Patient ID: Calixto Vasilopoulos MRN: [id number] DOB/AGE: Mar 31, 1933 81 y.o.  Admit date: 07/26/2015 Discharge date: 07/28/2015  Admission Diagnoses: Lower GI bleed  Discharge Diagnoses:  Principal Problem:   Hematochezia Active Problems:   Diabetes mellitus type II, controlled   HTN (hypertension)   HLD (hyperlipidemia)   CAD (coronary artery disease)   Discharged Condition: stable  Hospital Course: Patient was admitted for lower GI bleed. Aspirin was placed on hold. He received IV fluids. Patient was kept NPO initially and monitored closely for active bleeding. He received IV Protonix. Patient did not have any further episodes of bleeding. Hemoglobin and hematocrit remained stable. He received sliding scale insulin coverage for Type 2 diabetes. Oral hypoglycemic was held. Hypertension remained well controlled. Patient was started on clear liquids. GI evaluated the patient and recommended colonoscopy as out-patient. Patient tolerated soft diet for breakfast and lunch today. Shall discharge home. Follow up with GI for colonoscopy as out-patient. Hold Aspirin until GI work up is completed. Patient will follow up with me in 2-3 weeks.     Consults: GI  Discharge Exam: Blood pressure 130/51, pulse 58, temperature 97.4 F (36.3 C), temperature source Oral, resp. rate 18, height 5\' 9"  (1.753 m), weight 73.12 kg (161 lb 3.2 oz), SpO2 97 %. General: alert, oriented x 3, NAD HENT: no JVD, no pallor, no icterus Chest: clear to auscultation CVS: RRR, S1S2, no murmur Abdomen: soft, nontender, no hepatosplenomegaly Ext: no lower leg edema.  Neuro: moving all extremities.   Disposition: Home     Medication List    STOP taking these medications        aspirin EC 81 MG tablet      TAKE these medications        iron polysaccharides 150 MG capsule  Commonly known as:  NIFEREX  Take 150 mg by mouth daily.     lisinopril 20 MG tablet  Commonly known as:   PRINIVIL,ZESTRIL  Take 20 mg by mouth daily.     lovastatin 40 MG tablet  Commonly known as:  MEVACOR  Take 40 mg by mouth at bedtime.     metFORMIN 500 MG 24 hr tablet  Commonly known as:  GLUCOPHAGE-XR  Take 1,000 mg by mouth 2 (two) times daily.     metoprolol tartrate 25 MG tablet  Commonly known as:  LOPRESSOR  Take 12.5 mg by mouth 2 (two) times daily.     pantoprazole 40 MG tablet  Commonly known as:  PROTONIX  Take 1 tablet (40 mg total) by mouth daily.     Vitamin B-12 CR 1500 MCG Tbcr  Take 1 tablet by mouth daily.         Signed: Breslin Hemann 07/28/2015, 12:48 PM

## 2015-07-28 NOTE — Care Management (Signed)
Admitted to [facility] with the diagnosis of hematochezia. Lives with spouse. Son is Richie [phone]). Goes to Dr. Delana Meyer Singh's office for physician's care.  Spoke with Dr. Candiss Norse this morning. Hgb 11.1 today. No active bleeding. If tolerates heart healthy diet today, will discharge this afternoon. Shelbie Ammons RN MSN Care Management [phone]

## 2015-07-28 NOTE — Progress Notes (Signed)
Discharge instructions explained to pt/ verbalized an understanding/ iv and tele removed/ will transport off unit when ride arrives.  

## 2015-07-28 NOTE — Discharge Instructions (Signed)
Follow up with GI for colonoscopy as out-patient. Hold Aspirin until GI work up is completed. Follow up with Dr. Candiss Norse in 2-3 weeks.

## 2015-08-09 DIAGNOSIS — K625 Hemorrhage of anus and rectum: Secondary | ICD-10-CM | POA: Diagnosis not present

## 2015-08-10 DIAGNOSIS — R809 Proteinuria, unspecified: Secondary | ICD-10-CM | POA: Diagnosis not present

## 2015-08-10 DIAGNOSIS — K219 Gastro-esophageal reflux disease without esophagitis: Secondary | ICD-10-CM | POA: Diagnosis not present

## 2015-08-10 DIAGNOSIS — E1129 Type 2 diabetes mellitus with other diabetic kidney complication: Secondary | ICD-10-CM | POA: Diagnosis not present

## 2015-08-10 DIAGNOSIS — D649 Anemia, unspecified: Secondary | ICD-10-CM | POA: Diagnosis not present

## 2015-08-10 DIAGNOSIS — I10 Essential (primary) hypertension: Secondary | ICD-10-CM | POA: Diagnosis not present

## 2015-08-10 DIAGNOSIS — D508 Other iron deficiency anemias: Secondary | ICD-10-CM | POA: Diagnosis not present

## 2015-08-11 DIAGNOSIS — I10 Essential (primary) hypertension: Secondary | ICD-10-CM | POA: Diagnosis not present

## 2015-08-11 DIAGNOSIS — R809 Proteinuria, unspecified: Secondary | ICD-10-CM | POA: Diagnosis not present

## 2015-08-11 DIAGNOSIS — D649 Anemia, unspecified: Secondary | ICD-10-CM | POA: Diagnosis not present

## 2015-08-11 DIAGNOSIS — D508 Other iron deficiency anemias: Secondary | ICD-10-CM | POA: Diagnosis not present

## 2015-08-11 DIAGNOSIS — K219 Gastro-esophageal reflux disease without esophagitis: Secondary | ICD-10-CM | POA: Diagnosis not present

## 2015-08-11 DIAGNOSIS — E1129 Type 2 diabetes mellitus with other diabetic kidney complication: Secondary | ICD-10-CM | POA: Diagnosis not present

## 2015-09-06 ENCOUNTER — Encounter: Admission: RE | Payer: Self-pay | Source: Ambulatory Visit

## 2015-09-06 ENCOUNTER — Ambulatory Visit
Admission: RE | Admit: 2015-09-06 | Payer: Commercial Managed Care - HMO | Source: Ambulatory Visit | Admitting: Gastroenterology

## 2015-09-06 SURGERY — COLONOSCOPY WITH PROPOFOL
Anesthesia: General

## 2015-09-14 DIAGNOSIS — R809 Proteinuria, unspecified: Secondary | ICD-10-CM | POA: Diagnosis not present

## 2015-09-14 DIAGNOSIS — I10 Essential (primary) hypertension: Secondary | ICD-10-CM | POA: Diagnosis not present

## 2015-09-14 DIAGNOSIS — Z23 Encounter for immunization: Secondary | ICD-10-CM | POA: Diagnosis not present

## 2015-09-14 DIAGNOSIS — E1129 Type 2 diabetes mellitus with other diabetic kidney complication: Secondary | ICD-10-CM | POA: Diagnosis not present

## 2015-09-14 DIAGNOSIS — E780 Pure hypercholesterolemia: Secondary | ICD-10-CM | POA: Diagnosis not present

## 2015-11-16 DIAGNOSIS — J40 Bronchitis, not specified as acute or chronic: Secondary | ICD-10-CM | POA: Diagnosis not present

## 2015-11-16 DIAGNOSIS — J069 Acute upper respiratory infection, unspecified: Secondary | ICD-10-CM | POA: Diagnosis not present

## 2015-12-22 DIAGNOSIS — R809 Proteinuria, unspecified: Secondary | ICD-10-CM | POA: Diagnosis not present

## 2015-12-22 DIAGNOSIS — E7800 Pure hypercholesterolemia, unspecified: Secondary | ICD-10-CM | POA: Diagnosis not present

## 2015-12-22 DIAGNOSIS — I10 Essential (primary) hypertension: Secondary | ICD-10-CM | POA: Diagnosis not present

## 2015-12-22 DIAGNOSIS — E1129 Type 2 diabetes mellitus with other diabetic kidney complication: Secondary | ICD-10-CM | POA: Diagnosis not present

## 2015-12-22 DIAGNOSIS — Z125 Encounter for screening for malignant neoplasm of prostate: Secondary | ICD-10-CM | POA: Diagnosis not present

## 2015-12-24 DIAGNOSIS — I2510 Atherosclerotic heart disease of native coronary artery without angina pectoris: Secondary | ICD-10-CM | POA: Diagnosis not present

## 2015-12-24 DIAGNOSIS — I447 Left bundle-branch block, unspecified: Secondary | ICD-10-CM | POA: Diagnosis not present

## 2015-12-24 DIAGNOSIS — E1129 Type 2 diabetes mellitus with other diabetic kidney complication: Secondary | ICD-10-CM | POA: Diagnosis not present

## 2015-12-24 DIAGNOSIS — I10 Essential (primary) hypertension: Secondary | ICD-10-CM | POA: Diagnosis not present

## 2016-06-01 ENCOUNTER — Encounter: Payer: Self-pay | Admitting: *Deleted

## 2016-06-01 ENCOUNTER — Emergency Department
Admission: EM | Admit: 2016-06-01 | Discharge: 2016-06-01 | Disposition: A | Discharge status: Home / Self Care | Payer: Commercial Managed Care - HMO | Attending: Emergency Medicine | Admitting: Emergency Medicine

## 2016-06-01 DIAGNOSIS — E785 Hyperlipidemia, unspecified: Secondary | ICD-10-CM | POA: Diagnosis not present

## 2016-06-01 DIAGNOSIS — Z79899 Other long term (current) drug therapy: Secondary | ICD-10-CM | POA: Insufficient documentation

## 2016-06-01 DIAGNOSIS — Z951 Presence of aortocoronary bypass graft: Secondary | ICD-10-CM | POA: Diagnosis not present

## 2016-06-01 DIAGNOSIS — I2510 Atherosclerotic heart disease of native coronary artery without angina pectoris: Secondary | ICD-10-CM | POA: Diagnosis not present

## 2016-06-01 DIAGNOSIS — Z8679 Personal history of other diseases of the circulatory system: Secondary | ICD-10-CM | POA: Diagnosis not present

## 2016-06-01 DIAGNOSIS — R0789 Other chest pain: Secondary | ICD-10-CM | POA: Diagnosis not present

## 2016-06-01 DIAGNOSIS — Z7984 Long term (current) use of oral hypoglycemic drugs: Secondary | ICD-10-CM | POA: Insufficient documentation

## 2016-06-01 DIAGNOSIS — E119 Type 2 diabetes mellitus without complications: Secondary | ICD-10-CM | POA: Diagnosis not present

## 2016-06-01 DIAGNOSIS — F17290 Nicotine dependence, other tobacco product, uncomplicated: Secondary | ICD-10-CM | POA: Diagnosis not present

## 2016-06-01 DIAGNOSIS — B029 Zoster without complications: Secondary | ICD-10-CM | POA: Diagnosis not present

## 2016-06-01 DIAGNOSIS — R21 Rash and other nonspecific skin eruption: Secondary | ICD-10-CM | POA: Diagnosis present

## 2016-06-01 DIAGNOSIS — Z96659 Presence of unspecified artificial knee joint: Secondary | ICD-10-CM | POA: Insufficient documentation

## 2016-06-01 DIAGNOSIS — M1990 Unspecified osteoarthritis, unspecified site: Secondary | ICD-10-CM | POA: Insufficient documentation

## 2016-06-01 DIAGNOSIS — I10 Essential (primary) hypertension: Secondary | ICD-10-CM | POA: Insufficient documentation

## 2016-06-01 MED ORDER — VALACYCLOVIR HCL 1 G PO TABS: 1000.0000 mg | ORAL_TABLET | Freq: Three times a day (TID) | ORAL | Status: DC

## 2016-06-01 MED ORDER — HYDROCODONE-ACETAMINOPHEN 5-325 MG PO TABS: 1.0000 | ORAL_TABLET | Freq: Four times a day (QID) | ORAL | Status: DC | PRN

## 2016-06-01 NOTE — Discharge Instructions (Signed)

## 2016-06-01 NOTE — ED Notes (Signed)
Pt states chest pain left rib area, upon assesment raise red rash noticed to left lower chest area, denies any SOB, hx of CABG

## 2016-06-01 NOTE — ED Provider Notes (Signed)
[facility] Emergency Department Provider Note  ____________________________________________    I have reviewed the triage vital signs and the nursing notes.   HISTORY  Chief Complaint Chest Pain and Rash    HPI Michael Simon is a 82 y.o. male who presents with complaints of left lower chest pain that started approximately 2 days ago. He reports a burning discomfort in the area. He reports a history of coronary artery disease, diabetes and hypertension. He sees Dr. [name] of cardiology. He denies shortness of breath, diaphoresis, pleurisy. No fevers or chills. No nausea or vomiting.     Past Medical History  Diagnosis Date  . CAD (coronary artery disease)   . Type II diabetes mellitus ([place])   . HTN (hypertension)   . HLD (hyperlipidemia)   . Osteoarthritis   . LBBB (left bundle branch block)     Patient Active Problem List   Diagnosis Date Noted  . Hematochezia 07/26/2015  . Diabetes mellitus type II, controlled ([place]) 07/26/2015  . HTN (hypertension) 07/26/2015  . HLD (hyperlipidemia) 07/26/2015  . CAD (coronary artery disease) 07/26/2015    Past Surgical History  Procedure Laterality Date  . Joint replacement      knees  . Coronary artery bypass graft  2012  . Cataract extraction    . Inguinal hernia repair      Current Outpatient Rx  Name  Route  Sig  Dispense  Refill  . Cyanocobalamin (VITAMIN B-12 CR) 1500 MCG TBCR   Oral   Take 1 tablet by mouth daily.         Marland Kitchen HYDROcodone-acetaminophen (NORCO/VICODIN) 5-325 MG tablet   Oral   Take 1 tablet by mouth every 6 (six) hours as needed for severe pain.   20 tablet   0   . iron polysaccharides (NIFEREX) 150 MG capsule   Oral   Take 150 mg by mouth daily.         Marland Kitchen lisinopril (PRINIVIL,ZESTRIL) 20 MG tablet   Oral   Take 20 mg by mouth daily.         Marland Kitchen lovastatin (MEVACOR) 40 MG tablet   Oral   Take 40 mg by mouth at bedtime.         . metFORMIN (GLUCOPHAGE-XR)  500 MG 24 hr tablet   Oral   Take 1,000 mg by mouth 2 (two) times daily.          . metoprolol tartrate (LOPRESSOR) 25 MG tablet   Oral   Take 12.5 mg by mouth 2 (two) times daily.         . pantoprazole (PROTONIX) 40 MG tablet   Oral   Take 1 tablet (40 mg total) by mouth daily.   90 tablet   1   . valACYclovir (VALTREX) 1000 MG tablet   Oral   Take 1 tablet (1,000 mg total) by mouth 3 (three) times daily.   21 tablet   0     Allergies Review of patient's allergies indicates no known allergies.  Family History  Problem Relation Age of Onset  . Arthritis Mother   . Heart disease Mother   . Heart disease Other     Social History Social History  Substance Use Topics  . Smoking status: Former Smoker    Quit date: 03/03/2008  . Smokeless tobacco: Current User    Types: Chew  . Alcohol Use: No    Review of Systems  Constitutional: Negative for fever. Eyes: Negative forChange in vision  Cardiovascular: As above Respiratory: Negative for shortness of breath, negative for cough Gastrointestinal: Negative for abdominal pain  Musculoskeletal: Negative for back pain.  Neurological: Negative for numbness or tingling Psychiatric: no anxiety    ____________________________________________   PHYSICAL EXAM:  VITAL SIGNS: ED Triage Vitals  Enc Vitals Group     BP 06/01/16 0918 187/89 mmHg     Pulse Rate 06/01/16 0918 66     Resp 06/01/16 0918 18     Temp 06/01/16 0918 98.4 F (36.9 C)     Temp Source 06/01/16 0918 Oral     SpO2 06/01/16 0918 99 %     Weight 06/01/16 0918 160 lb (72.576 kg)     Height 06/01/16 0918 5\' 10"  (1.778 m)     Head Cir --      Peak Flow --      Pain Score 06/01/16 0919 8     Pain Loc --      Pain Edu? --      Excl. in [place]? --      Constitutional: Alert and oriented. Well appearing and in no distress. Pleasant and interactive Eyes: Conjunctivae are normal. No erythema or injection ENT   Head: Normocephalic and  atraumatic.   Mouth/Throat: Mucous membranes are moist. Cardiovascular: Normal rate, regular rhythm. Normal and symmetric distal pulses are present in the upper extremities.  Respiratory: Normal respiratory effort without tachypnea nor retractions. Breath sounds are clear and equal bilaterally.  Gastrointestinal: Soft and non-tender in all quadrants. No distention. There is no CVA tenderness. Genitourinary: deferred Musculoskeletal: Nontender with normal range of motion in all extremities. No lower extremity tenderness nor edema. Neurologic:  Normal speech and language. No gross focal neurologic deficits are appreciated. Skin:  Skin is warm, dry and intact. Left sided erythematous rash consistent with shingles to the left lower chest extending around to the back Psychiatric: Mood and affect are normal. Patient exhibits appropriate insight and judgment.  ____________________________________________    LABS (pertinent positives/negatives)  Labs Reviewed - No data to display  ____________________________________________   EKG  ED ECG REPORT I, Lavonia Drafts, the attending physician, personally viewed and interpreted this ECG.  Date: 06/01/2016 EKG Time: 9:21 AM Rate: 60 Rhythm: normal sinus rhythm QRS Axis: Left axis deviation Intervals: normal ST/T Wave abnormalities: normal Conduction Disturbances: Left bundle branch block Narrative Interpretation: unremarkable   ____________________________________________    RADIOLOGY  None  ____________________________________________   PROCEDURES  Procedure(s) performed: none  Critical Care performed: none  ____________________________________________   INITIAL IMPRESSION / ASSESSMENT AND PLAN / ED COURSE  Pertinent labs & imaging results that were available during my care of the patient were reviewed by me and considered in my medical decision making (see chart for details).  Patient's exam is consistent with  shingles. This is exactly where his pain is. His EKG is unchanged from prior. I offered to send cardiac enzymes and lab tests but patient and family declined. We will treat with Valtrex for 7 days and Vicodin when necessary if severe pain. Follow-up with PCP. Patient agrees.   ____________________________________________   FINAL CLINICAL IMPRESSION(S) / ED DIAGNOSES  Final diagnoses:  Shingles          Lavonia Drafts, MD 06/01/16 [phone]

## 2016-07-04 DIAGNOSIS — I10 Essential (primary) hypertension: Secondary | ICD-10-CM | POA: Diagnosis not present

## 2016-07-04 DIAGNOSIS — I447 Left bundle-branch block, unspecified: Secondary | ICD-10-CM | POA: Diagnosis not present

## 2016-07-04 DIAGNOSIS — I2510 Atherosclerotic heart disease of native coronary artery without angina pectoris: Secondary | ICD-10-CM | POA: Diagnosis not present

## 2016-07-04 DIAGNOSIS — I2584 Coronary atherosclerosis due to calcified coronary lesion: Secondary | ICD-10-CM | POA: Diagnosis not present

## 2016-07-04 DIAGNOSIS — E7800 Pure hypercholesterolemia, unspecified: Secondary | ICD-10-CM | POA: Diagnosis not present

## 2016-10-12 DIAGNOSIS — R809 Proteinuria, unspecified: Secondary | ICD-10-CM | POA: Diagnosis not present

## 2016-10-12 DIAGNOSIS — I10 Essential (primary) hypertension: Secondary | ICD-10-CM | POA: Diagnosis not present

## 2016-10-12 DIAGNOSIS — E7800 Pure hypercholesterolemia, unspecified: Secondary | ICD-10-CM | POA: Diagnosis not present

## 2016-10-12 DIAGNOSIS — E1129 Type 2 diabetes mellitus with other diabetic kidney complication: Secondary | ICD-10-CM | POA: Diagnosis not present

## 2016-10-12 DIAGNOSIS — Z125 Encounter for screening for malignant neoplasm of prostate: Secondary | ICD-10-CM | POA: Diagnosis not present

## 2016-10-25 DIAGNOSIS — Z23 Encounter for immunization: Secondary | ICD-10-CM | POA: Diagnosis not present

## 2016-10-25 DIAGNOSIS — Z0000 Encounter for general adult medical examination without abnormal findings: Secondary | ICD-10-CM | POA: Diagnosis not present

## 2017-01-22 DIAGNOSIS — E7800 Pure hypercholesterolemia, unspecified: Secondary | ICD-10-CM | POA: Diagnosis not present

## 2017-01-22 DIAGNOSIS — I10 Essential (primary) hypertension: Secondary | ICD-10-CM | POA: Diagnosis not present

## 2017-01-22 DIAGNOSIS — I2510 Atherosclerotic heart disease of native coronary artery without angina pectoris: Secondary | ICD-10-CM | POA: Diagnosis not present

## 2017-01-22 DIAGNOSIS — I2584 Coronary atherosclerosis due to calcified coronary lesion: Secondary | ICD-10-CM | POA: Diagnosis not present

## 2017-01-22 DIAGNOSIS — I447 Left bundle-branch block, unspecified: Secondary | ICD-10-CM | POA: Diagnosis not present

## 2017-01-26 DIAGNOSIS — R809 Proteinuria, unspecified: Secondary | ICD-10-CM | POA: Diagnosis not present

## 2017-01-26 DIAGNOSIS — I10 Essential (primary) hypertension: Secondary | ICD-10-CM | POA: Diagnosis not present

## 2017-01-26 DIAGNOSIS — E1129 Type 2 diabetes mellitus with other diabetic kidney complication: Secondary | ICD-10-CM | POA: Diagnosis not present

## 2017-01-26 DIAGNOSIS — E7800 Pure hypercholesterolemia, unspecified: Secondary | ICD-10-CM | POA: Diagnosis not present

## 2017-01-26 DIAGNOSIS — D649 Anemia, unspecified: Secondary | ICD-10-CM | POA: Diagnosis not present

## 2017-01-26 DIAGNOSIS — R21 Rash and other nonspecific skin eruption: Secondary | ICD-10-CM | POA: Diagnosis not present

## 2017-04-27 DIAGNOSIS — E1129 Type 2 diabetes mellitus with other diabetic kidney complication: Secondary | ICD-10-CM | POA: Diagnosis not present

## 2017-04-27 DIAGNOSIS — R809 Proteinuria, unspecified: Secondary | ICD-10-CM | POA: Diagnosis not present

## 2017-04-27 DIAGNOSIS — D649 Anemia, unspecified: Secondary | ICD-10-CM | POA: Diagnosis not present

## 2017-05-04 DIAGNOSIS — E7800 Pure hypercholesterolemia, unspecified: Secondary | ICD-10-CM | POA: Diagnosis not present

## 2017-05-04 DIAGNOSIS — R809 Proteinuria, unspecified: Secondary | ICD-10-CM | POA: Diagnosis not present

## 2017-05-04 DIAGNOSIS — E1129 Type 2 diabetes mellitus with other diabetic kidney complication: Secondary | ICD-10-CM | POA: Diagnosis not present

## 2017-05-04 DIAGNOSIS — I10 Essential (primary) hypertension: Secondary | ICD-10-CM | POA: Diagnosis not present

## 2017-05-04 DIAGNOSIS — K219 Gastro-esophageal reflux disease without esophagitis: Secondary | ICD-10-CM | POA: Diagnosis not present

## 2017-05-04 DIAGNOSIS — Z125 Encounter for screening for malignant neoplasm of prostate: Secondary | ICD-10-CM | POA: Diagnosis not present

## 2017-07-18 DIAGNOSIS — E119 Type 2 diabetes mellitus without complications: Secondary | ICD-10-CM | POA: Diagnosis not present

## 2017-07-26 DIAGNOSIS — I447 Left bundle-branch block, unspecified: Secondary | ICD-10-CM | POA: Diagnosis not present

## 2017-07-26 DIAGNOSIS — I2584 Coronary atherosclerosis due to calcified coronary lesion: Secondary | ICD-10-CM | POA: Diagnosis not present

## 2017-07-26 DIAGNOSIS — I2510 Atherosclerotic heart disease of native coronary artery without angina pectoris: Secondary | ICD-10-CM | POA: Diagnosis not present

## 2017-07-26 DIAGNOSIS — I10 Essential (primary) hypertension: Secondary | ICD-10-CM | POA: Diagnosis not present

## 2017-08-14 DIAGNOSIS — K219 Gastro-esophageal reflux disease without esophagitis: Secondary | ICD-10-CM | POA: Diagnosis not present

## 2017-08-14 DIAGNOSIS — E7800 Pure hypercholesterolemia, unspecified: Secondary | ICD-10-CM | POA: Diagnosis not present

## 2017-08-14 DIAGNOSIS — I10 Essential (primary) hypertension: Secondary | ICD-10-CM | POA: Diagnosis not present

## 2017-08-14 DIAGNOSIS — E1142 Type 2 diabetes mellitus with diabetic polyneuropathy: Secondary | ICD-10-CM | POA: Diagnosis not present

## 2017-10-30 DIAGNOSIS — I10 Essential (primary) hypertension: Secondary | ICD-10-CM | POA: Diagnosis not present

## 2017-10-30 DIAGNOSIS — E7800 Pure hypercholesterolemia, unspecified: Secondary | ICD-10-CM | POA: Diagnosis not present

## 2017-10-30 DIAGNOSIS — Z0000 Encounter for general adult medical examination without abnormal findings: Secondary | ICD-10-CM | POA: Diagnosis not present

## 2017-10-30 DIAGNOSIS — E1142 Type 2 diabetes mellitus with diabetic polyneuropathy: Secondary | ICD-10-CM | POA: Diagnosis not present

## 2017-11-01 ENCOUNTER — Ambulatory Visit: Payer: Self-pay | Admitting: Podiatry

## 2017-11-05 ENCOUNTER — Ambulatory Visit: Payer: Self-pay | Admitting: Podiatry

## 2017-12-08 DIAGNOSIS — E1142 Type 2 diabetes mellitus with diabetic polyneuropathy: Secondary | ICD-10-CM | POA: Diagnosis not present

## 2018-01-30 ENCOUNTER — Encounter: Payer: Self-pay | Admitting: Emergency Medicine

## 2018-01-30 ENCOUNTER — Emergency Department
Admission: EM | Admit: 2018-01-30 | Discharge: 2018-01-30 | Disposition: A | Discharge status: Home / Self Care | Payer: Medicare HMO | Attending: Emergency Medicine | Admitting: Emergency Medicine

## 2018-01-30 ENCOUNTER — Other Ambulatory Visit: Payer: Self-pay

## 2018-01-30 DIAGNOSIS — H6001 Abscess of right external ear: Secondary | ICD-10-CM | POA: Diagnosis not present

## 2018-01-30 DIAGNOSIS — I10 Essential (primary) hypertension: Secondary | ICD-10-CM | POA: Diagnosis not present

## 2018-01-30 DIAGNOSIS — Z87891 Personal history of nicotine dependence: Secondary | ICD-10-CM | POA: Diagnosis not present

## 2018-01-30 DIAGNOSIS — Z7982 Long term (current) use of aspirin: Secondary | ICD-10-CM | POA: Diagnosis not present

## 2018-01-30 DIAGNOSIS — Z951 Presence of aortocoronary bypass graft: Secondary | ICD-10-CM | POA: Insufficient documentation

## 2018-01-30 DIAGNOSIS — Z7984 Long term (current) use of oral hypoglycemic drugs: Secondary | ICD-10-CM | POA: Insufficient documentation

## 2018-01-30 DIAGNOSIS — E119 Type 2 diabetes mellitus without complications: Secondary | ICD-10-CM | POA: Insufficient documentation

## 2018-01-30 DIAGNOSIS — R410 Disorientation, unspecified: Secondary | ICD-10-CM | POA: Diagnosis not present

## 2018-01-30 DIAGNOSIS — I2510 Atherosclerotic heart disease of native coronary artery without angina pectoris: Secondary | ICD-10-CM | POA: Insufficient documentation

## 2018-01-30 DIAGNOSIS — Z79899 Other long term (current) drug therapy: Secondary | ICD-10-CM | POA: Insufficient documentation

## 2018-01-30 DIAGNOSIS — R4182 Altered mental status, unspecified: Secondary | ICD-10-CM | POA: Diagnosis present

## 2018-01-30 LAB — COMPREHENSIVE METABOLIC PANEL
ALT: 9 U/L — ABNORMAL LOW (ref 17–63)
AST: 19 U/L (ref 15–41)
Albumin: 4 g/dL (ref 3.5–5.0)
Alkaline Phosphatase: 53 U/L (ref 38–126)
Anion gap: 10 (ref 5–15)
BUN: 18 mg/dL (ref 6–20)
CHLORIDE: 99 mmol/L — AB (ref 101–111)
CO2: 26 mmol/L (ref 22–32)
Calcium: 8.8 mg/dL — ABNORMAL LOW (ref 8.9–10.3)
Creatinine, Ser: 1.04 mg/dL (ref 0.61–1.24)
GFR calc Af Amer: >60 mL/min (ref >60)
GFR calc non Af Amer: >60 mL/min (ref >60)
Glucose, Bld: 209 mg/dL — ABNORMAL HIGH (ref 65–99)
POTASSIUM: 3.9 mmol/L (ref 3.5–5.1)
SODIUM: 135 mmol/L (ref 135–145)
Total Bilirubin: 0.8 mg/dL (ref 0.3–1.2)
Total Protein: 7 g/dL (ref 6.5–8.1)

## 2018-01-30 LAB — CBC WITH DIFFERENTIAL/PLATELET
BASOS ABS: 0.1 10*3/uL (ref 0–0.1)
BASOS PCT: 1 %
EOS ABS: 0.1 10*3/uL (ref 0–0.7)
EOS PCT: 1 %
HCT: 33.3 % — ABNORMAL LOW (ref 40.0–52.0)
Hemoglobin: 10.8 g/dL — ABNORMAL LOW (ref 13.0–18.0)
LYMPHS PCT: 15 %
Lymphs Abs: 1.1 10*3/uL (ref 1.0–3.6)
MCH: 29.4 pg (ref 26.0–34.0)
MCHC: 32.5 g/dL (ref 32.0–36.0)
MCV: 90.7 fL (ref 80.0–100.0)
MONO ABS: 0.5 10*3/uL (ref 0.2–1.0)
Monocytes Relative: 8 %
Neutro Abs: 5.3 10*3/uL (ref 1.4–6.5)
Neutrophils Relative %: 75 %
PLATELETS: 203 10*3/uL (ref 150–440)
RBC: 3.67 MIL/uL — AB (ref 4.40–5.90)
RDW: 14.3 % (ref 11.5–14.5)
WBC: 7 10*3/uL (ref 3.8–10.6)

## 2018-01-30 LAB — URINALYSIS, COMPLETE (UACMP) WITH MICROSCOPIC
BILIRUBIN URINE: NEGATIVE
Bacteria, UA: NONE SEEN
GLUCOSE, UA: NEGATIVE mg/dL
Hgb urine dipstick: NEGATIVE
KETONES UR: 5 mg/dL — AB
LEUKOCYTES UA: NEGATIVE
Nitrite: NEGATIVE
PH: 5 (ref 5.0–8.0)
Protein, ur: 100 mg/dL — AB
Specific Gravity, Urine: 1.019 (ref 1.005–1.030)

## 2018-01-30 LAB — GLUCOSE, CAPILLARY: Glucose-Capillary: 173 mg/dL — ABNORMAL HIGH (ref 65–99)

## 2018-01-30 LAB — TROPONIN I

## 2018-01-30 NOTE — ED Notes (Signed)
FIRST NURSE NOTE: Pt to ER via POV with family. Pt ambulatory. Family member states that patient has had altered mental status since awakening this AM. Pt offered wheelchair, refuses. Does not use walker or cane at baseline. Pt ambulates safely.

## 2018-01-30 NOTE — ED Notes (Signed)
Pt discharged to home.  Family member driving.  Discharge instructions reviewed.  Verbalized understanding.  No questions or concerns at this time.  Teach back verified.  Pt in NAD.  No items left in ED.   

## 2018-01-30 NOTE — ED Notes (Signed)
Went in rm to check to see if pt urinated. Pt had urinal between legs and stated "I can not pee right now, I tried." Samantha,RN aware.

## 2018-01-30 NOTE — ED Triage Notes (Signed)
Pt here for confusion. Son states started this am. Pt alert and oriented x4. NAD.

## 2018-01-30 NOTE — ED Notes (Signed)
Pt given urinal to collect urine specimen.

## 2018-01-30 NOTE — ED Provider Notes (Signed)
[facility] Emergency Department Provider Note  ____________________________________________  Time seen: Approximately 9:24 AM  I have reviewed the triage vital signs and the nursing notes.   HISTORY  Chief Complaint Altered Mental Status   HPI Michael Simon is a 84 y.o. male with a history of CAD status post CABG, hypertension, hyperlipidemia, diabetes who presents for evaluation of altered mental status. According to the son patient was in his usual state of health yesterday evening. This morning patient was slightly confused. The confusion resolved after patient ate a biscuit for breakfast. They did not check his sugar at home. Son denies noticing facial droop, slurred speech, difficulty finding words, or unilateral weakness or numbness. At this time son reports the patient is back to his baseline. Patient's only complaint is pain and swelling of the R earlobe which has been there for 1 day. He denies headaches, changes in vision, chest pain, shortness of breath, URI symptoms, fever, chills, cough, abdominal pain, nausea, vomiting, diarrhea, dysuria. Patient is not on insulin at home.    Chief Complaint: confusion Severity: mild Duration: 30 min Timing: this morning Modifying factors: resolved after eating a biscuit Associated signs/symptoms: No stroke like symptoms, cough, fever, CP or SOB  Past Medical History:  Diagnosis Date  . CAD (coronary artery disease)   . HLD (hyperlipidemia)   . HTN (hypertension)   . LBBB (left bundle branch block)   . Osteoarthritis   . Type II diabetes mellitus [facility])     Patient Active Problem List   Diagnosis Date Noted  . Hematochezia 07/26/2015  . Type 2 diabetes mellitus ([name]) 07/26/2015  . HTN (hypertension) 07/26/2015  . HLD (hyperlipidemia) 07/26/2015  . CAD (coronary artery disease) 07/26/2015    Past Surgical History:  Procedure Laterality Date  . CATARACT EXTRACTION    . CORONARY ARTERY BYPASS  GRAFT  2012  . INGUINAL HERNIA REPAIR    . JOINT REPLACEMENT     knees    Prior to Admission medications   Medication Sig Start Date End Date Taking? Authorizing Provider  aspirin EC 81 MG tablet Take by mouth.    [provider]  Cyanocobalamin (RA VITAMIN B-12 TR) 1000 MCG TBCR Take by mouth.    [provider]  Cyanocobalamin (VITAMIN B-12 CR) 1500 MCG TBCR Take 1 tablet by mouth daily.    [provider]  FLUZONE HIGH-DOSE 0.5 ML injection TO BE ADMINISTERED BY PHARMACIST FOR IMMUNIZATION 09/13/17   [provider]  HYDROcodone-acetaminophen (NORCO/VICODIN) 5-325 MG tablet Take 1 tablet by mouth every 6 (six) hours as needed for severe pain. 06/01/16   Lavonia Drafts, MD  iron polysaccharides (FERREX 150) 150 MG capsule TAKE ONE CAPSULE BY MOUTH EVERY DAY 05/24/16   [provider]  iron polysaccharides (NIFEREX) 150 MG capsule Take 150 mg by mouth daily.    [provider]  lisinopril (PRINIVIL,ZESTRIL) 20 MG tablet Take 20 mg by mouth daily.    [provider]  lisinopril (PRINIVIL,ZESTRIL) 20 MG tablet TAKE 1 TABLET (20 MG TOTAL) BY MOUTH ONCE DAILY. 04/14/16   [provider]  lovastatin (MEVACOR) 40 MG tablet Take 40 mg by mouth at bedtime.    [provider]  lovastatin (MEVACOR) 40 MG tablet TAKE 1 TABLET (40 MG TOTAL) BY MOUTH ONCE DAILY. 02/16/16   [provider]  metFORMIN (GLUCOPHAGE-XR) 500 MG 24 hr tablet Take 1,000 mg by mouth 2 (two) times daily.     [provider]  metFORMIN (  GLUCOPHAGE-XR) 500 MG 24 hr tablet TAKE 2 TABLETS (1,000 MG TOTAL) BY MOUTH 2 (TWO) TIMES DAILY. 08/25/15   [provider]  metoprolol tartrate (LOPRESSOR) 25 MG tablet Take 12.5 mg by mouth 2 (two) times daily.    [provider]  metoprolol tartrate (LOPRESSOR) 25 MG tablet TAKE 1/2 TABLET BY MOUTH TWICE A DAY 05/03/16   [provider]  pantoprazole (PROTONIX) 40 MG tablet Take 1  tablet (40 mg total) by mouth daily. 07/28/15   Glendon Axe, MD  pantoprazole (PROTONIX) 40 MG tablet TAKE 1 TABLET (40 MG TOTAL) BY MOUTH DAILY. 02/16/16   [provider]  valACYclovir (VALTREX) 1000 MG tablet Take 1 tablet (1,000 mg total) by mouth 3 (three) times daily. 06/01/16   Lavonia Drafts, MD    Allergies Patient has no known allergies.  Family History  Problem Relation Age of Onset  . Arthritis Mother   . Heart disease Mother   . Heart disease Other     Social History Social History   Tobacco Use  . Smoking status: Former Smoker    Last attempt to quit: 03/03/2008    Years since quitting: 9.9  . Smokeless tobacco: Current User    Types: Chew  Substance Use Topics  . Alcohol use: No    Alcohol/week: 0.0 oz  . Drug use: No    Review of Systems  Constitutional: Negative for fever. + AMS Eyes: Negative for visual changes. ENT: Negative for sore throat. + pain and swelling of R earlobe Neck: No neck pain  Cardiovascular: Negative for chest pain. Respiratory: Negative for shortness of breath. Gastrointestinal: Negative for abdominal pain, vomiting or diarrhea. Genitourinary: Negative for dysuria. Musculoskeletal: Negative for back pain. Skin: Negative for rash. Neurological: Negative for headaches, weakness or numbness. Psych: No SI or HI  ____________________________________________   PHYSICAL EXAM:  VITAL SIGNS: ED Triage Vitals  Enc Vitals Group     BP 01/30/18 0808 (!) 173/67     Pulse Rate 01/30/18 0808 85     Resp 01/30/18 0808 20     Temp 01/30/18 0808 98 F (36.7 C)     Temp Source 01/30/18 0808 Oral     SpO2 01/30/18 0808 99 %     Weight 01/30/18 0809 160 lb (72.6 kg)     Height --      Head Circumference --      Peak Flow --      Pain Score --      Pain Loc --      Pain Edu? --      Excl. in [place]? --     Constitutional: Alert and oriented. Well appearing and in no apparent distress. HEENT:      Head: Normocephalic and  atraumatic.         Eyes: Conjunctivae are normal. Sclera is non-icteric.       Mouth/Throat: Mucous membranes are moist.       Ear: There is very small abscess located in the right earlobe with no overlying erythema      Neck: Supple with no signs of meningismus. Cardiovascular: Regular rate and rhythm. No murmurs, gallops, or rubs. 2+ symmetrical distal pulses are present in all extremities. No JVD. Respiratory: Normal respiratory effort. Lungs are clear to auscultation bilaterally. No wheezes, crackles, or rhonchi.  Gastrointestinal: Soft, non tender, and non distended with positive bowel sounds. No rebound or guarding. Musculoskeletal: Nontender with normal range of motion in all extremities. No edema, cyanosis, or erythema  of extremities. Neurologic: Normal speech and language. A & O x3, PERRL, EOMI, no nystagmus, CN II-XII intact, motor testing reveals good tone and bulk throughout. There is no evidence of pronator drift or dysmetria. Muscle strength is 5/5 throughout. Sensory examination is intact. Gait is normal. Skin: Skin is warm, dry and intact. No rash noted. Psychiatric: Mood and affect are normal. Speech and behavior are normal.  ____________________________________________   LABS (all labs ordered are listed, but only abnormal results are displayed)  Labs Reviewed  CBC WITH DIFFERENTIAL/PLATELET - Abnormal; Notable for the following components:      Result Value   RBC 3.67 (*)    Hemoglobin 10.8 (*)    HCT 33.3 (*)    All other components within normal limits  COMPREHENSIVE METABOLIC PANEL - Abnormal; Notable for the following components:   Chloride 99 (*)    Glucose, Bld 209 (*)    Calcium 8.8 (*)    ALT 9 (*)    All other components within normal limits  URINALYSIS, COMPLETE (UACMP) WITH MICROSCOPIC - Abnormal; Notable for the following components:   Color, Urine YELLOW (*)    APPearance CLEAR (*)    Ketones, ur 5 (*)    Protein, ur 100 (*)    Squamous Epithelial  / LPF 0-5 (*)    All other components within normal limits  GLUCOSE, CAPILLARY - Abnormal; Notable for the following components:   Glucose-Capillary 173 (*)    All other components within normal limits  TROPONIN I  CBG MONITORING, ED   ____________________________________________  EKG  ED ECG REPORT I, Rudene Re, the attending physician, personally viewed and interpreted this ECG.  Normal sinus rhythm, rate of 63, first-degree AV block, left bundle branch block, normal QTC, left axis deviation, no ST elevations or depressions, T-wave inversions in lateral leads. Unchanged from prior.  ____________________________________________  RADIOLOGY  none ____________________________________________   PROCEDURES  Procedure(s) performed: yes .Marland KitchenIncision and Drainage Date/Time: 01/30/2018 11:26 AM Performed by: Rudene Re, MD Authorized by: Rudene Re, MD   Consent:    Consent obtained:  Verbal   Consent given by:  Patient   Risks discussed:  Bleeding, infection, incomplete drainage and pain   Alternatives discussed:  Alternative treatment, delayed treatment and observation Location:    Type:  Abscess   Size:  0.5   Location:  Head   Head location:  R external ear Pre-procedure details:    Skin preparation:  Antiseptic wash Anesthesia (see MAR for exact dosages):    Anesthesia method:  None Procedure type:    Complexity:  Simple Procedure details:    Incision types:  Stab incision   Scalpel blade:  11   Wound management:  Probed and deloculated   Drainage:  Purulent   Drainage amount:  Moderate   Wound treatment:  Wound left open Post-procedure details:    Patient tolerance of procedure:  Tolerated well, no immediate complications   Critical Care performed:  None ____________________________________________   INITIAL IMPRESSION / ASSESSMENT AND PLAN / ED COURSE   84 y.o. male with a history of CAD status post CABG, hypertension,  hyperlipidemia, diabetes who presents for evaluation of short lived episode of altered mental status at home. Patient is now back to baseline per son who is at the bedside. It seems like episode resolved after patient ate this morning therefore making the suspicious for hypoglycemia area and patient sugar here is normal at 173. Patient has no complaints at this time. He is neurologically intact  and according to the son he did not noted any neuro deficits during this episode therefore less likely to be a TIA. We'll check basic labs to rule out dehydration, anemia, electrolyte abnormalities, UTI, cardiac ischemia. If all labs are normal but dissipated discharge home.  Earlobe abscess will be drained    _________________________ 11:27 AM on 01/30/2018 -----------------------------------------  Labs, UA, troponin, EKG with no acute findings. Patient remains at his baseline per son. Abscess was drained per procedure note above. No overlying erythema therefore we'll hold off antibiotics at this time. Discussed return precautions and close follow-up with patient.   As part of my medical decision making, I reviewed the following data within the [place] History obtained from family, Nursing notes reviewed and incorporated, Labs reviewed , EKG interpreted , Old EKG reviewed, Notes from prior ED visits and [place] Controlled Substance Database    Pertinent labs & imaging results that were available during my care of the patient were reviewed by me and considered in my medical decision making (see chart for details).    ____________________________________________   FINAL CLINICAL IMPRESSION(S) / ED DIAGNOSES  Final diagnoses:  Confusion  Abscess, earlobe, right      NEW MEDICATIONS STARTED DURING THIS VISIT:  ED Discharge Orders    None       Note:  This document was prepared using Dragon voice recognition software and may include unintentional dictation errors.      Alfred Levins, [place], MD 01/30/18 1128

## 2018-02-01 ENCOUNTER — Other Ambulatory Visit: Payer: Self-pay

## 2018-02-01 ENCOUNTER — Emergency Department (HOSPITAL_COMMUNITY): Payer: Medicare HMO

## 2018-02-01 ENCOUNTER — Encounter (HOSPITAL_COMMUNITY): Payer: Self-pay | Admitting: *Deleted

## 2018-02-01 ENCOUNTER — Observation Stay (HOSPITAL_COMMUNITY)
Admission: EM | Admit: 2018-02-01 | Discharge: 2018-02-03 | Disposition: A | Discharge status: Home Health | Payer: Medicare HMO | Attending: Internal Medicine | Admitting: Internal Medicine

## 2018-02-01 DIAGNOSIS — Z951 Presence of aortocoronary bypass graft: Secondary | ICD-10-CM | POA: Insufficient documentation

## 2018-02-01 DIAGNOSIS — Z7982 Long term (current) use of aspirin: Secondary | ICD-10-CM | POA: Diagnosis not present

## 2018-02-01 DIAGNOSIS — R2689 Other abnormalities of gait and mobility: Secondary | ICD-10-CM | POA: Insufficient documentation

## 2018-02-01 DIAGNOSIS — D509 Iron deficiency anemia, unspecified: Secondary | ICD-10-CM | POA: Insufficient documentation

## 2018-02-01 DIAGNOSIS — A419 Sepsis, unspecified organism: Secondary | ICD-10-CM | POA: Diagnosis present

## 2018-02-01 DIAGNOSIS — E118 Type 2 diabetes mellitus with unspecified complications: Secondary | ICD-10-CM

## 2018-02-01 DIAGNOSIS — H6001 Abscess of right external ear: Secondary | ICD-10-CM

## 2018-02-01 DIAGNOSIS — E785 Hyperlipidemia, unspecified: Secondary | ICD-10-CM | POA: Diagnosis not present

## 2018-02-01 DIAGNOSIS — G9341 Metabolic encephalopathy: Secondary | ICD-10-CM | POA: Diagnosis not present

## 2018-02-01 DIAGNOSIS — I447 Left bundle-branch block, unspecified: Secondary | ICD-10-CM | POA: Insufficient documentation

## 2018-02-01 DIAGNOSIS — R4020 Unspecified coma: Secondary | ICD-10-CM | POA: Diagnosis not present

## 2018-02-01 DIAGNOSIS — E119 Type 2 diabetes mellitus without complications: Secondary | ICD-10-CM | POA: Diagnosis not present

## 2018-02-01 DIAGNOSIS — I119 Hypertensive heart disease without heart failure: Secondary | ICD-10-CM | POA: Diagnosis not present

## 2018-02-01 DIAGNOSIS — I2510 Atherosclerotic heart disease of native coronary artery without angina pectoris: Secondary | ICD-10-CM | POA: Diagnosis not present

## 2018-02-01 DIAGNOSIS — Z87891 Personal history of nicotine dependence: Secondary | ICD-10-CM | POA: Diagnosis not present

## 2018-02-01 DIAGNOSIS — Z794 Long term (current) use of insulin: Secondary | ICD-10-CM | POA: Insufficient documentation

## 2018-02-01 DIAGNOSIS — R531 Weakness: Secondary | ICD-10-CM | POA: Diagnosis not present

## 2018-02-01 DIAGNOSIS — G9389 Other specified disorders of brain: Secondary | ICD-10-CM | POA: Insufficient documentation

## 2018-02-01 DIAGNOSIS — Z79899 Other long term (current) drug therapy: Secondary | ICD-10-CM | POA: Insufficient documentation

## 2018-02-01 DIAGNOSIS — I10 Essential (primary) hypertension: Secondary | ICD-10-CM | POA: Diagnosis present

## 2018-02-01 DIAGNOSIS — R410 Disorientation, unspecified: Secondary | ICD-10-CM | POA: Diagnosis not present

## 2018-02-01 DIAGNOSIS — R509 Fever, unspecified: Secondary | ICD-10-CM | POA: Diagnosis not present

## 2018-02-01 LAB — INFLUENZA PANEL BY PCR (TYPE A & B)
Influenza A By PCR: NEGATIVE
Influenza B By PCR: NEGATIVE

## 2018-02-01 LAB — I-STAT CG4 LACTIC ACID, ED
LACTIC ACID, VENOUS: 3.7 mmol/L — AB (ref 0.5–1.9)
Lactic Acid, Venous: 1.61 mmol/L (ref 0.5–1.9)

## 2018-02-01 LAB — CBC
HCT: 34.8 % — ABNORMAL LOW (ref 39.0–52.0)
Hemoglobin: 11 g/dL — ABNORMAL LOW (ref 13.0–17.0)
MCH: 29.2 pg (ref 26.0–34.0)
MCHC: 31.6 g/dL (ref 30.0–36.0)
MCV: 92.3 fL (ref 78.0–100.0)
PLATELETS: 223 10*3/uL (ref 150–400)
RBC: 3.77 MIL/uL — ABNORMAL LOW (ref 4.22–5.81)
RDW: 13.9 % (ref 11.5–15.5)
WBC: 9.6 10*3/uL (ref 4.0–10.5)

## 2018-02-01 LAB — URINALYSIS, ROUTINE W REFLEX MICROSCOPIC
BILIRUBIN URINE: NEGATIVE
GLUCOSE, UA: NEGATIVE mg/dL
Hgb urine dipstick: NEGATIVE
KETONES UR: NEGATIVE mg/dL
LEUKOCYTES UA: NEGATIVE
NITRITE: NEGATIVE
PH: 5 (ref 5.0–8.0)
Protein, ur: 100 mg/dL — AB
SPECIFIC GRAVITY, URINE: 1.024 (ref 1.005–1.030)
Squamous Epithelial / LPF: NONE SEEN

## 2018-02-01 LAB — COMPREHENSIVE METABOLIC PANEL
ALBUMIN: 3.9 g/dL (ref 3.5–5.0)
ALK PHOS: 55 U/L (ref 38–126)
ALT: 11 U/L — ABNORMAL LOW (ref 17–63)
AST: 18 U/L (ref 15–41)
Anion gap: 13 (ref 5–15)
BUN: 25 mg/dL — AB (ref 6–20)
CALCIUM: 8.9 mg/dL (ref 8.9–10.3)
CO2: 22 mmol/L (ref 22–32)
CREATININE: 1.23 mg/dL (ref 0.61–1.24)
Chloride: 102 mmol/L (ref 101–111)
GFR calc Af Amer: >60 mL/min (ref >60)
GFR, EST NON AFRICAN AMERICAN: 53 mL/min — AB (ref >60)
GLUCOSE: 127 mg/dL — AB (ref 65–99)
POTASSIUM: 4.3 mmol/L (ref 3.5–5.1)
Sodium: 137 mmol/L (ref 135–145)
Total Bilirubin: 0.7 mg/dL (ref 0.3–1.2)
Total Protein: 7.1 g/dL (ref 6.5–8.1)

## 2018-02-01 LAB — DIFFERENTIAL
Basophils Absolute: 0 10*3/uL (ref 0.0–0.1)
Basophils Relative: 0 %
EOS ABS: 0.1 10*3/uL (ref 0.0–0.7)
Eosinophils Relative: 2 %
LYMPHS ABS: 2.1 10*3/uL (ref 0.7–4.0)
Lymphocytes Relative: 22 %
MONO ABS: 0.6 10*3/uL (ref 0.1–1.0)
MONOS PCT: 7 %
Neutro Abs: 6.4 10*3/uL (ref 1.7–7.7)
Neutrophils Relative %: 69 %

## 2018-02-01 MED ORDER — PIPERACILLIN-TAZOBACTAM 3.375 G IVPB 30 MIN
3.3750 g | Freq: Once | INTRAVENOUS | Status: AC
Start: 2018-02-01 — End: 2018-02-01
  Administered 2018-02-01: 3.375 g via INTRAVENOUS
  Filled 2018-02-01: qty 50

## 2018-02-01 MED ORDER — SODIUM CHLORIDE 0.9 % IV BOLUS (SEPSIS)
1000.0000 mL | Freq: Once | INTRAVENOUS | Status: AC
  Administered 2018-02-01: 1000 mL via INTRAVENOUS

## 2018-02-01 MED ORDER — ACETAMINOPHEN 325 MG PO TABS
650.0000 mg | ORAL_TABLET | Freq: Once | ORAL | Status: AC
  Administered 2018-02-01: 650 mg via ORAL
  Filled 2018-02-01: qty 2

## 2018-02-01 MED ORDER — SODIUM CHLORIDE 0.9 % IV BOLUS (SEPSIS)
250.0000 mL | Freq: Once | INTRAVENOUS | Status: AC
  Administered 2018-02-01: 250 mL via INTRAVENOUS

## 2018-02-01 MED ORDER — PIPERACILLIN-TAZOBACTAM 3.375 G IVPB
3.3750 g | Freq: Three times a day (TID) | INTRAVENOUS | Status: DC
  Administered 2018-02-02: 3.375 g via INTRAVENOUS
  Filled 2018-02-01 (×2): qty 50

## 2018-02-01 MED ORDER — VANCOMYCIN HCL IN DEXTROSE 1-5 GM/200ML-% IV SOLN: 1000.0000 mg | INTRAVENOUS | Status: DC

## 2018-02-01 MED ORDER — VANCOMYCIN HCL 10 G IV SOLR
1250.0000 mg | Freq: Once | INTRAVENOUS | Status: AC
  Administered 2018-02-01: 1250 mg via INTRAVENOUS
  Filled 2018-02-01: qty 1250

## 2018-02-01 NOTE — ED Provider Notes (Addendum)
[facility]MENT Provider Note   CSN: [id number] Arrival date & time: 02/01/18  1730     History   Chief Complaint Chief Complaint  Patient presents with  . Altered Mental Status    HPI [facility] is a 84 y.o. male.  HPI   84 year old male with a history of hypertension, hyperlipidemia, diabetes, coronary artery disease presents with concern for altered mental status and generalized weakness.  Son reports intermittent symptoms since about 3 days ago.  They presented to [facility] 3 days ago after he had a transient episode of confusion, at which point he had lab work done and a right earlobe abscess drained.  His mental status improved and he was discharged.  Son reports that since then he has had intermittent confusion, for example beginning to take off his shoes, and then starting to tie the other one.  Reports that today he had generalized weakness and fatigue, was unable to ambulate around the house.  Denies any trauma, or focal neurologic abnormalities, including no focal weakness, focal numbness, difficulty talking.  He has not had any facial droop.  Reports they were not aware of him having a fever until he arrived to the emergency department and was found to have a temperature of 100.8.  Son reports he has been generally weak and intermittently confused, although his confusion has improved now.  Patient denies symptoms at this time.  Specifically, he denies cough, abdominal pain, chest pain, shortness of breath, urinary symptoms.  Patient reports that he has congestion when he goes outside and this is chronic.  Denies any headache today.  No recent change in medications.  Reports his ear lobe abscess has improved since the drainage she had 3 days ago, denies any increasing pain, swelling or erythema to the area.  Past Medical History:  Diagnosis Date  . CAD (coronary artery disease)   . HLD (hyperlipidemia)   . HTN (hypertension)     . LBBB (left bundle branch block)   . Osteoarthritis   . Type II diabetes mellitus [facility])     Patient Active Problem List   Diagnosis Date Noted  . Weakness 02/02/2018  . Hematochezia 07/26/2015  . Type 2 diabetes mellitus ([place]) 07/26/2015  . HTN (hypertension) 07/26/2015  . HLD (hyperlipidemia) 07/26/2015  . CAD (coronary artery disease) 07/26/2015    Past Surgical History:  Procedure Laterality Date  . CATARACT EXTRACTION    . CORONARY ARTERY BYPASS GRAFT  2012  . INGUINAL HERNIA REPAIR    . JOINT REPLACEMENT     knees       Home Medications    Prior to Admission medications   Medication Sig Start Date End Date Taking? Authorizing Provider  aspirin EC 81 MG tablet Take by mouth.   Yes [provider]  Cyanocobalamin (RA VITAMIN B-12 TR) 1000 MCG TBCR Take by mouth.   Yes [provider]  iron polysaccharides (NIFEREX) 150 MG capsule Take 150 mg by mouth daily.   Yes [provider]  lisinopril (PRINIVIL,ZESTRIL) 20 MG tablet Take 20 mg by mouth daily.   Yes [provider]  lovastatin (MEVACOR) 40 MG tablet Take 40 mg by mouth at bedtime.   Yes [provider]  metFORMIN (GLUCOPHAGE-XR) 500 MG 24 hr tablet Take 1,000 mg by mouth 2 (two) times daily.    Yes [provider]  metoprolol tartrate (LOPRESSOR) 25 MG tablet Take 12.5 mg by mouth 2 (two) times daily.   Yes  [provider]  pantoprazole (PROTONIX) 40 MG tablet Take 1 tablet (40 mg total) by mouth daily. 07/28/15  Yes Glendon Axe, MD  HYDROcodone-acetaminophen (NORCO/VICODIN) 5-325 MG tablet Take 1 tablet by mouth every 6 (six) hours as needed for severe pain. Patient not taking: Reported on 02/01/2018 06/01/16   Lavonia Drafts, MD  valACYclovir (VALTREX) 1000 MG tablet Take 1 tablet (1,000 mg total) by mouth 3 (three) times daily. Patient not taking: Reported on 02/01/2018 06/01/16   Lavonia Drafts, MD    Family History Family History  Problem  Relation Age of Onset  . Arthritis Mother   . Heart disease Mother   . Heart disease Other     Social History Social History   Tobacco Use  . Smoking status: Former Smoker    Last attempt to quit: 03/03/2008    Years since quitting: 9.9  . Smokeless tobacco: Current User    Types: Chew  Substance Use Topics  . Alcohol use: No    Alcohol/week: 0.0 oz  . Drug use: No     Allergies   Patient has no known allergies.   Review of Systems Review of Systems  Constitutional: Negative for fever.  HENT: Negative for sore throat.   Eyes: Negative for visual disturbance.  Respiratory: Negative for shortness of breath.   Cardiovascular: Negative for chest pain.  Gastrointestinal: Negative for abdominal pain, diarrhea, nausea and vomiting.  Genitourinary: Negative for difficulty urinating and dysuria.  Musculoskeletal: Negative for back pain and neck stiffness.  Skin: Positive for wound (right ear abscess). Negative for rash.  Neurological: Negative for dizziness, syncope, facial asymmetry, speech difficulty, weakness, light-headedness, numbness and headaches.     Physical Exam Updated Vital Signs BP (!) 121/57   Pulse 64   Temp 98.2 F (36.8 C)   Resp 14   Ht 5\' 6"  (1.676 m)   Wt 72.6 kg (160 lb)   SpO2 95%   BMI 25.82 kg/m   Physical Exam  Constitutional: He is oriented to person, place, and time. He appears well-developed and well-nourished. No distress.  HENT:  Head: Normocephalic and atraumatic.  Right ear with slight swelling, no surrounding erythema, small wound present to right ear lobe, able to express moderate amount of purulent discharge from area  Eyes: Conjunctivae and EOM are normal.  Neck: Normal range of motion.  Cardiovascular: Normal rate, regular rhythm, normal heart sounds and intact distal pulses. Exam reveals no gallop and no friction rub.  No murmur heard. Pulmonary/Chest: Effort normal and breath sounds normal. No respiratory distress. He has no  wheezes. He has no rales.  Abdominal: Soft. He exhibits no distension. There is no tenderness. There is no guarding.  Musculoskeletal: He exhibits no edema.  Neurological: He is alert and oriented to person, place, and time.  Skin: Skin is warm and dry. He is not diaphoretic.  Nursing note and vitals reviewed.    ED Treatments / Results  Labs (all labs ordered are listed, but only abnormal results are displayed) Labs Reviewed  COMPREHENSIVE METABOLIC PANEL - Abnormal; Notable for the following components:      Result Value   Glucose, Bld 127 (*)    BUN 25 (*)    ALT 11 (*)    GFR calc non Af Amer 53 (*)    All other components within normal limits  CBC - Abnormal; Notable for the following components:   RBC 3.77 (*)    Hemoglobin 11.0 (*)    HCT 34.8 (*)  All other components within normal limits  URINALYSIS, ROUTINE W REFLEX MICROSCOPIC - Abnormal; Notable for the following components:   Protein, ur 100 (*)    Bacteria, UA RARE (*)    All other components within normal limits  I-STAT CG4 LACTIC ACID, ED - Abnormal; Notable for the following components:   Lactic Acid, Venous 3.70 (*)    All other components within normal limits  URINE CULTURE  CULTURE, BLOOD (ROUTINE X 2)  CULTURE, BLOOD (ROUTINE X 2)  DIFFERENTIAL  INFLUENZA PANEL BY PCR (TYPE A & B)  I-STAT CG4 LACTIC ACID, ED    EKG  EKG Interpretation None       Radiology Dg Chest 2 View  Result Date: 02/01/2018 CLINICAL DATA:  Confusion and fever EXAM: CHEST  2 VIEW COMPARISON:  Chest radiograph 05/25/2011 FINDINGS: Remote median sternotomy for CABG. Discontinuity of multiple median sternotomy wires. No focal airspace consolidation or pulmonary edema. No pleural effusion or pneumothorax. IMPRESSION: 1. No acute cardiopulmonary disease. 2. Multifocal discontinuity of median sternotomy wires, new compared to 05/25/2011. Has there been an interval repeat sternotomy? Electronically Signed   By: Ulyses Jarred M.D.    On: 02/01/2018 18:43   Ct Head Wo Contrast  Result Date: 02/01/2018 CLINICAL DATA:  Altered level of consciousness. Confusion times 3-4 days. EXAM: CT HEAD WITHOUT CONTRAST TECHNIQUE: Contiguous axial images were obtained from the base of the skull through the vertex without intravenous contrast. COMPARISON:  04/19/2007 FINDINGS: Brain: Chronic moderate small vessel ischemic disease of periventricular and subcortical white matter. Mild involutional changes of brain with ventriculomegaly and mild superficial atrophy. No large vascular territory infarct, hemorrhage or midline shift. No intra-axial mass nor extra-axial fluid collections. Vascular: Moderate atherosclerotic calcifications of the carotid siphons. Hyperdense vessels. Skull: Developmental incomplete posterior arch of C1. Sinuses/Orbits: Intact orbits and globes. Bilateral lens replacements. No acute sinus or mastoid disease. Other: None IMPRESSION: Atrophy with chronic moderate small vessel ischemic disease. No acute intracranial abnormality. Electronically Signed   By: Ashley Royalty M.D.   On: 02/01/2018 23:32    Procedures .Critical Care Performed by: Gareth Morgan, MD Authorized by: Gareth Morgan, MD   Critical care provider statement:    Critical care time (minutes):  30   Critical care time was exclusive of:  Separately billable procedures and treating other patients and teaching time   Critical care was necessary to treat or prevent imminent or life-threatening deterioration of the following conditions:  Sepsis   Critical care was time spent personally by me on the following activities:  Obtaining history from patient or surrogate, ordering and review of laboratory studies, ordering and review of radiographic studies, pulse oximetry, re-evaluation of patient's condition and review of old charts   (including critical care time)  Medications Ordered in ED Medications  piperacillin-tazobactam (ZOSYN) IVPB 3.375 g (not  administered)  vancomycin (VANCOCIN) IVPB 1000 mg/200 mL premix (not administered)  acetaminophen (TYLENOL) tablet 650 mg (650 mg Oral Given 02/01/18 1807)  sodium chloride 0.9 % bolus 1,000 mL (0 mLs Intravenous Stopped 02/01/18 2215)  sodium chloride 0.9 % bolus 1,000 mL (0 mLs Intravenous Stopped 02/01/18 2245)  sodium chloride 0.9 % bolus 250 mL (0 mLs Intravenous Stopped 02/01/18 2245)  vancomycin (VANCOCIN) 1,250 mg in sodium chloride 0.9 % 250 mL IVPB (0 mg Intravenous Stopped 02/02/18 0019)  piperacillin-tazobactam (ZOSYN) IVPB 3.375 g (0 g Intravenous Stopped 02/01/18 2247)     Initial Impression / Assessment and Plan / ED Course  I have reviewed the  triage vital signs and the nursing notes.  Pertinent labs & imaging results that were available during my care of the patient were reviewed by me and considered in my medical decision making (see chart for details).     84 year old male with a history of hypertension, hyperlipidemia, diabetes, coronary artery disease presents with concern for altered mental status and generalized weakness.  Patient febrile to 100.8 on arrival to the emergency with otherwise normal vital signs.  Lactic acid elevated to 3.7.  Unclear etiology of fever by history, but given elevation in lactic acid, symptoms consistent with generalized weakness and waxing and waning delirium at home, I am concerned that this combination of symptoms represent sepsis from unclear etiology.  Urinalysis shows no signs of UTI.  Influenza testing is negative.  Patient denies headache, currently has normal mental status, without meningeal signs and have low suspicion for meningitis.  CT head was done which was within normal limits.  He has no signs of cellulitis.  Chest x-ray shows no signs of pneumonia.  He does have small area of right earlobe abscess without surrounding erythema and no extension of area of swelling and while this may represent a possible source, the area is confined and  small and seems less likely.  Blood cultures are pending, and initiated vancomycin and Zosyn given elevated unclear infection source.  He was given 30 cc/kg of normal saline.  Repeat lactic acid is improved.  Will admit for continued observation of suspected sepsis from unclear etiology, waxing and waning delirium and lactic acidosis.  Final Clinical Impressions(s) / ED Diagnoses   Final diagnoses:  Sepsis, due to unspecified organism [facility])  Abscess of right earlobe    ED Discharge Orders    None       Gareth Morgan, MD 02/02/18 7579    Gareth Morgan, MD 03/18/18 1043

## 2018-02-01 NOTE — ED Notes (Signed)
Dr Billy Fischer given a copy of lactic acid results 3.70

## 2018-02-01 NOTE — ED Triage Notes (Signed)
The pt has been confused for the past 3-4 days he was seen at [place] 2-3 days ago  He has not had much energy for the past few days  This am  He was better but this afternoon he was having chills and more weakness

## 2018-02-01 NOTE — Progress Notes (Signed)
Pharmacy Antibiotic Note  [facility] is a 84 y.o. male who presented to the [facility] on 2/15 with AMS concerning for sepsis.  Pharmacy has been consulted for Vancomycin and Zosyn dosing.  Plan: 1. Vancomycin 1250 mg IV x 1 followed by 1g IV every 24 hours 2. Zosyn 3.375g IV x 1 (infused over 30 minutes) followed by 3.375g IV every 8 hours (infused over 4 hours) 3. Will continue to follow renal function, culture results, LOT, and antibiotic de-escalation plans   Height: 5\' 6"  (167.6 cm) Weight: 160 lb (72.6 kg) IBW/kg (Calculated) : 63.8  Temp (24hrs), Avg:100.1 F (37.8 C), Min:99.4 F (37.4 C), Max:100.8 F (38.2 C)  Recent Labs  Lab 01/30/18 0837 02/01/18 1808 02/01/18 2043  WBC 7.0 9.6  --   CREATININE 1.04 1.23  --   LATICACIDVEN  --   --  3.70*    Estimated Creatinine Clearance: 41.8 mL/min (by C-G formula based on SCr of 1.23 mg/dL).    No Known Allergies  Antimicrobials this admission: Vanc 2/15 >> Zosyn 2/15 >>  Dose adjustments this admission: n/a  Microbiology results: 2/15 BCx >> 2/15 UCx >>  Thank you for allowing pharmacy to be a part of this patient's care.  Alycia Rossetti, PharmD, BCPS Clinical Pharmacist Pager: [phone] Clinical phone for 02/01/2018: [phone] 02/01/2018 9:44 PM

## 2018-02-02 ENCOUNTER — Encounter (HOSPITAL_COMMUNITY): Payer: Self-pay | Admitting: Internal Medicine

## 2018-02-02 DIAGNOSIS — I119 Hypertensive heart disease without heart failure: Secondary | ICD-10-CM | POA: Diagnosis not present

## 2018-02-02 DIAGNOSIS — D509 Iron deficiency anemia, unspecified: Secondary | ICD-10-CM | POA: Diagnosis not present

## 2018-02-02 DIAGNOSIS — H6001 Abscess of right external ear: Secondary | ICD-10-CM

## 2018-02-02 DIAGNOSIS — I2510 Atherosclerotic heart disease of native coronary artery without angina pectoris: Secondary | ICD-10-CM | POA: Diagnosis not present

## 2018-02-02 DIAGNOSIS — Z794 Long term (current) use of insulin: Secondary | ICD-10-CM

## 2018-02-02 DIAGNOSIS — I10 Essential (primary) hypertension: Secondary | ICD-10-CM

## 2018-02-02 DIAGNOSIS — R531 Weakness: Secondary | ICD-10-CM

## 2018-02-02 DIAGNOSIS — E1159 Type 2 diabetes mellitus with other circulatory complications: Secondary | ICD-10-CM

## 2018-02-02 DIAGNOSIS — E119 Type 2 diabetes mellitus without complications: Secondary | ICD-10-CM | POA: Diagnosis not present

## 2018-02-02 DIAGNOSIS — G9341 Metabolic encephalopathy: Secondary | ICD-10-CM | POA: Diagnosis not present

## 2018-02-02 DIAGNOSIS — E785 Hyperlipidemia, unspecified: Secondary | ICD-10-CM | POA: Diagnosis not present

## 2018-02-02 DIAGNOSIS — I447 Left bundle-branch block, unspecified: Secondary | ICD-10-CM | POA: Diagnosis not present

## 2018-02-02 DIAGNOSIS — R2689 Other abnormalities of gait and mobility: Secondary | ICD-10-CM | POA: Diagnosis not present

## 2018-02-02 LAB — CK: CK TOTAL: 113 U/L (ref 49–397)

## 2018-02-02 LAB — CBC
HCT: 30.8 % — ABNORMAL LOW (ref 39.0–52.0)
Hemoglobin: 10 g/dL — ABNORMAL LOW (ref 13.0–17.0)
MCH: 29.9 pg (ref 26.0–34.0)
MCHC: 32.5 g/dL (ref 30.0–36.0)
MCV: 92.2 fL (ref 78.0–100.0)
PLATELETS: 197 10*3/uL (ref 150–400)
RBC: 3.34 MIL/uL — AB (ref 4.22–5.81)
RDW: 14.2 % (ref 11.5–15.5)
WBC: 7.2 10*3/uL (ref 4.0–10.5)

## 2018-02-02 LAB — GLUCOSE, CAPILLARY
GLUCOSE-CAPILLARY: 124 mg/dL — AB (ref 65–99)
Glucose-Capillary: 79 mg/dL (ref 65–99)
Glucose-Capillary: 84 mg/dL (ref 65–99)
Glucose-Capillary: 94 mg/dL (ref 65–99)
Glucose-Capillary: 118 mg/dL — ABNORMAL HIGH (ref 65–99)

## 2018-02-02 LAB — BASIC METABOLIC PANEL
Anion gap: 13 (ref 5–15)
BUN: 21 mg/dL — ABNORMAL HIGH (ref 6–20)
CALCIUM: 8.3 mg/dL — AB (ref 8.9–10.3)
CO2: 23 mmol/L (ref 22–32)
CREATININE: 1.14 mg/dL (ref 0.61–1.24)
Chloride: 105 mmol/L (ref 101–111)
GFR calc non Af Amer: 58 mL/min — ABNORMAL LOW (ref >60)
Glucose, Bld: 85 mg/dL (ref 65–99)
Potassium: 3.9 mmol/L (ref 3.5–5.1)
SODIUM: 141 mmol/L (ref 135–145)

## 2018-02-02 LAB — TROPONIN I

## 2018-02-02 LAB — MAGNESIUM: MAGNESIUM: 1.3 mg/dL — AB (ref 1.7–2.4)

## 2018-02-02 LAB — TSH: TSH: 3.47 u[IU]/mL (ref 0.350–4.500)

## 2018-02-02 MED ORDER — METOPROLOL TARTRATE 12.5 MG HALF TABLET
12.5000 mg | ORAL_TABLET | Freq: Two times a day (BID) | ORAL | Status: DC
Start: 2018-02-02 — End: 2018-02-03
  Administered 2018-02-02 – 2018-02-03 (×3): 12.5 mg via ORAL
  Filled 2018-02-02 (×3): qty 1

## 2018-02-02 MED ORDER — ENOXAPARIN SODIUM 40 MG/0.4ML ~~LOC~~ SOLN
40.0000 mg | Freq: Every day | SUBCUTANEOUS | Status: DC
  Administered 2018-02-02 – 2018-02-03 (×2): 40 mg via SUBCUTANEOUS
  Filled 2018-02-02 (×2): qty 0.4

## 2018-02-02 MED ORDER — PANTOPRAZOLE SODIUM 40 MG PO TBEC
40.0000 mg | DELAYED_RELEASE_TABLET | Freq: Every day | ORAL | Status: DC
  Administered 2018-02-02 – 2018-02-03 (×2): 40 mg via ORAL
  Filled 2018-02-02 (×2): qty 1

## 2018-02-02 MED ORDER — SODIUM CHLORIDE 0.9 % IV SOLN
INTRAVENOUS | Status: DC
  Administered 2018-02-02: 02:00:00 via INTRAVENOUS

## 2018-02-02 MED ORDER — ACETAMINOPHEN 650 MG RE SUPP: 650.0000 mg | Freq: Four times a day (QID) | RECTAL | Status: DC | PRN

## 2018-02-02 MED ORDER — ASPIRIN EC 81 MG PO TBEC
81.0000 mg | DELAYED_RELEASE_TABLET | Freq: Every day | ORAL | Status: DC
  Administered 2018-02-02 – 2018-02-03 (×2): 81 mg via ORAL
  Filled 2018-02-02 (×2): qty 1

## 2018-02-02 MED ORDER — POLYSACCHARIDE IRON COMPLEX 150 MG PO CAPS
150.0000 mg | ORAL_CAPSULE | Freq: Every day | ORAL | Status: DC
  Administered 2018-02-02 – 2018-02-03 (×2): 150 mg via ORAL
  Filled 2018-02-02 (×2): qty 1

## 2018-02-02 MED ORDER — PRAVASTATIN SODIUM 40 MG PO TABS
40.0000 mg | ORAL_TABLET | Freq: Every day | ORAL | Status: DC
  Administered 2018-02-02: 40 mg via ORAL
  Filled 2018-02-02: qty 1

## 2018-02-02 MED ORDER — INSULIN ASPART 100 UNIT/ML ~~LOC~~ SOLN
0.0000 [IU] | Freq: Three times a day (TID) | SUBCUTANEOUS | Status: DC
  Administered 2018-02-03: 2 [IU] via SUBCUTANEOUS

## 2018-02-02 MED ORDER — ACETAMINOPHEN 325 MG PO TABS: 650.0000 mg | ORAL_TABLET | Freq: Four times a day (QID) | ORAL | 0 refills | Status: DC | PRN

## 2018-02-02 MED ORDER — ONDANSETRON HCL 4 MG/2ML IJ SOLN: 4.0000 mg | Freq: Four times a day (QID) | INTRAMUSCULAR | Status: DC | PRN

## 2018-02-02 MED ORDER — LISINOPRIL 20 MG PO TABS
20.0000 mg | ORAL_TABLET | Freq: Every day | ORAL | Status: DC
  Administered 2018-02-02 – 2018-02-03 (×2): 20 mg via ORAL
  Filled 2018-02-02 (×2): qty 1

## 2018-02-02 MED ORDER — ACETAMINOPHEN 325 MG PO TABS: 650.0000 mg | ORAL_TABLET | Freq: Four times a day (QID) | ORAL | Status: DC | PRN

## 2018-02-02 MED ORDER — ONDANSETRON HCL 4 MG PO TABS: 4.0000 mg | ORAL_TABLET | Freq: Four times a day (QID) | ORAL | Status: DC | PRN

## 2018-02-02 NOTE — H&P (Signed)
History and Physical    [name] DXI:[id number] DOB: [birth year] DOA: 02/01/2018  PCP: Teodoro Spray, MD  Patient coming from: Home.  Chief Complaint: Weakness.  HPI: [facility] is a 84 y.o. male with history of CAD status post CABG, diabetes mellitus type 2, hypertension was brought to the ER after patient was found to be weak.  As per the son patient drives car and usually functionally.  On January 30, 2018 patient was taken to [facility] for complaints of confusion and weakness and over that patient was found to have a small abscess of the right earlobe and was drained.  Workup of that was negative and patient was discharged home.  Today again patient was found to be weak confused and was brought to the ER.  Denies any nausea vomiting abdominal pain diarrhea chest pain shortness of breath or productive cough.  ED Course: In the ER patient is found to be febrile with temperature of 100.8.  CT head was unremarkable.  Lactate was elevated and improved with sepsis protocol fluids.  Blood cultures urine cultures were obtained and patient was started on empiric antibiotics.  By the time of exam patient is alert awake oriented to time place and person.  Denies any chest pain shortness with abdominal pain.  Patient is being admitted for further observation.  Patient has had right earlobe abscess drained 3 days ago at [facility] and had some mild residual abscess which was extracted by the ER physician today.  Review of Systems: As per HPI, rest all negative.   Past Medical History:  Diagnosis Date  . CAD (coronary artery disease)   . HLD (hyperlipidemia)   . HTN (hypertension)   . LBBB (left bundle branch block)   . Osteoarthritis   . Type II diabetes mellitus ([place])     Past Surgical History:  Procedure Laterality Date  . CATARACT EXTRACTION    . CORONARY ARTERY BYPASS GRAFT  2012  . INGUINAL HERNIA REPAIR    . JOINT REPLACEMENT     knees     reports that he quit smoking about 9 years ago. His smokeless tobacco use includes chew. He reports that he does not drink alcohol or use drugs.  No Known Allergies  Family History  Problem Relation Age of Onset  . Arthritis Mother   . Heart disease Mother   . Heart disease Other     Prior to Admission medications   Medication Sig Start Date End Date Taking? Authorizing Provider  aspirin EC 81 MG tablet Take by mouth.   Yes [provider]  Cyanocobalamin (RA VITAMIN B-12 TR) 1000 MCG TBCR Take by mouth.   Yes [provider]  iron polysaccharides (NIFEREX) 150 MG capsule Take 150 mg by mouth daily.   Yes [provider]  lisinopril (PRINIVIL,ZESTRIL) 20 MG tablet Take 20 mg by mouth daily.   Yes [provider]  lovastatin (MEVACOR) 40 MG tablet Take 40 mg by mouth at bedtime.   Yes [provider]  metFORMIN (GLUCOPHAGE-XR) 500 MG 24 hr tablet Take 1,000 mg by mouth 2 (two) times daily.    Yes [provider]  metoprolol tartrate (LOPRESSOR) 25 MG tablet Take 12.5 mg by mouth 2 (two) times daily.   Yes [provider]  pantoprazole (PROTONIX) 40 MG tablet Take 1 tablet (40 mg total) by mouth daily. 07/28/15  Yes Glendon Axe, MD  HYDROcodone-acetaminophen (NORCO/VICODIN) 5-325 MG tablet Take 1 tablet by mouth  every 6 (six) hours as needed for severe pain. Patient not taking: Reported on 02/01/2018 06/01/16   Lavonia Drafts, MD  valACYclovir (VALTREX) 1000 MG tablet Take 1 tablet (1,000 mg total) by mouth 3 (three) times daily. Patient not taking: Reported on 02/01/2018 06/01/16   Lavonia Drafts, MD    Physical Exam: Vitals:   02/01/18 2315 02/02/18 0015 02/02/18 0050 02/02/18 0130  BP: (!) 133/45 (!) 121/57  (!) 121/53  Pulse: 81 64  63  Resp: (!) 23 14  18   Temp:   98.2 F (36.8 C)   TempSrc:      SpO2: 95% 95%  96%  Weight:      Height:          Constitutional: Moderately built and  nourished. Vitals:   02/01/18 2315 02/02/18 0015 02/02/18 0050 02/02/18 0130  BP: (!) 133/45 (!) 121/57  (!) 121/53  Pulse: 81 64  63  Resp: (!) 23 14  18   Temp:   98.2 F (36.8 C)   TempSrc:      SpO2: 95% 95%  96%  Weight:      Height:       Eyes: Anicteric no pallor. ENMT: No discharge from the ears eyes nose or mouth. Neck: No mass felt.  No neck rigidity no JVD appreciated. Respiratory: No rhonchi or crepitations. Cardiovascular: S1-S2 heard no murmurs appreciated. Abdomen: Soft nontender bowel sounds present. Musculoskeletal: No edema.  No joint effusion. Skin: No rash.  Skin appears warm. Neurologic: Alert awake oriented to time place and person.  Moves all extremities. Psychiatric: Appears normal.  Normal affect.   Labs on Admission: I have personally reviewed following labs and imaging studies  CBC: Recent Labs  Lab 01/30/18 0837 02/01/18 1808 02/01/18 2009  WBC 7.0 9.6  --   NEUTROABS 5.3  --  6.4  HGB 10.8* 11.0*  --   HCT 33.3* 34.8*  --   MCV 90.7 92.3  --   PLT 203 223  --    Basic Metabolic Panel: Recent Labs  Lab 01/30/18 0837 02/01/18 1808  NA 135 137  K 3.9 4.3  CL 99* 102  CO2 26 22  GLUCOSE 209* 127*  BUN 18 25*  CREATININE 1.04 1.23  CALCIUM 8.8* 8.9   GFR: Estimated Creatinine Clearance: 41.8 mL/min (by C-G formula based on SCr of 1.23 mg/dL). Liver Function Tests: Recent Labs  Lab 01/30/18 0837 02/01/18 1808  AST 19 18  ALT 9* 11*  ALKPHOS 53 55  BILITOT 0.8 0.7  PROT 7.0 7.1  ALBUMIN 4.0 3.9   No results for input(s): LIPASE, AMYLASE in the last 168 hours. No results for input(s): AMMONIA in the last 168 hours. Coagulation Profile: No results for input(s): INR, PROTIME in the last 168 hours. Cardiac Enzymes: Recent Labs  Lab 01/30/18 0837  TROPONINI <0.03   BNP (last 3 results) No results for input(s): PROBNP in the last 8760 hours. HbA1C: No results for input(s): HGBA1C in the last 72 hours. CBG: Recent Labs   Lab 01/30/18 0834  GLUCAP 173*   Lipid Profile: No results for input(s): CHOL, HDL, LDLCALC, TRIG, CHOLHDL, LDLDIRECT in the last 72 hours. Thyroid Function Tests: No results for input(s): TSH, T4TOTAL, FREET4, T3FREE, THYROIDAB in the last 72 hours. Anemia Panel: No results for input(s): VITAMINB12, FOLATE, FERRITIN, TIBC, IRON, RETICCTPCT in the last 72 hours. Urine analysis:    Component Value Date/Time   COLORURINE YELLOW 02/01/2018 [place] 02/01/2018 1804  LABSPEC 1.024 02/01/2018 1804   PHURINE 5.0 02/01/2018 1804   GLUCOSEU NEGATIVE 02/01/2018 1804   HGBUR NEGATIVE 02/01/2018 1804   BILIRUBINUR NEGATIVE 02/01/2018 [place] 02/01/2018 1804   PROTEINUR 100 (A) 02/01/2018 1804   NITRITE NEGATIVE 02/01/2018 1804   LEUKOCYTESUR NEGATIVE 02/01/2018 1804   Sepsis Labs: @LABRCNTIP (procalcitonin:4,lacticidven:4) )No results found for this or any previous visit (from the past 240 hour(s)).   Radiological Exams on Admission: Dg Chest 2 View  Result Date: 02/01/2018 CLINICAL DATA:  Confusion and fever EXAM: CHEST  2 VIEW COMPARISON:  Chest radiograph 05/25/2011 FINDINGS: Remote median sternotomy for CABG. Discontinuity of multiple median sternotomy wires. No focal airspace consolidation or pulmonary edema. No pleural effusion or pneumothorax. IMPRESSION: 1. No acute cardiopulmonary disease. 2. Multifocal discontinuity of median sternotomy wires, new compared to 05/25/2011. Has there been an interval repeat sternotomy? Electronically Signed   By: Ulyses Jarred M.D.   On: 02/01/2018 18:43   Ct Head Wo Contrast  Result Date: 02/01/2018 CLINICAL DATA:  Altered level of consciousness. Confusion times 3-4 days. EXAM: CT HEAD WITHOUT CONTRAST TECHNIQUE: Contiguous axial images were obtained from the base of the skull through the vertex without intravenous contrast. COMPARISON:  04/19/2007 FINDINGS: Brain: Chronic moderate small vessel ischemic disease of  periventricular and subcortical white matter. Mild involutional changes of brain with ventriculomegaly and mild superficial atrophy. No large vascular territory infarct, hemorrhage or midline shift. No intra-axial mass nor extra-axial fluid collections. Vascular: Moderate atherosclerotic calcifications of the carotid siphons. Hyperdense vessels. Skull: Developmental incomplete posterior arch of C1. Sinuses/Orbits: Intact orbits and globes. Bilateral lens replacements. No acute sinus or mastoid disease. Other: None IMPRESSION: Atrophy with chronic moderate small vessel ischemic disease. No acute intracranial abnormality. Electronically Signed   By: Ashley Royalty M.D.   On: 02/01/2018 23:32    EKG: Independently reviewed.  Sinus rhythm with first-degree AV block LBBB.  LBBB is chronic.  Assessment/Plan Active Problems:   Type 2 diabetes mellitus (HCC)   HTN (hypertension)   CAD (coronary artery disease)   Weakness   Abscess of right earlobe   Generalized weakness    1. Generalized weakness with episodes of confusion -patient's initial pictures showed signs of developing sepsis which improved with fluid bolus and empiric antibiotics.  Follow blood cultures for now we will continue with empiric antibiotics.  Continue with hydration.  We will get physical therapy consult.  Check cardiac markers.  In addition we will check CK and TSH.  I have also ordered MRI brain. 2. CAD status post CABG continue -statins aspirin metoprolol. 3. Hypertension we will continue home medications. 4. Diabetes mellitus type 2 -we will keep patient on sliding scale coverage.  Hold metformin while inpatient.  5. Iron deficiency anemia with history of GI bleed on iron supplements.   DVT prophylaxis: Lovenox. Code Status: Full code. Family Communication: Discussed with patient's son. Disposition Plan: To be determined. Consults called: Physical therapy. Admission status: Observation.   Rise Patience MD [facility] Pager [phone].  If 7PM-7AM, please contact night-coverage [email or web address] Password [email or web address]  02/02/2018, 2:00 AM

## 2018-02-02 NOTE — Progress Notes (Signed)
Arrie Eastern to be D/C'd Home per MD order.  Discussed with the patient and all questions fully answered.  VSS, Skin clean, dry and intact without evidence of skin break down, no evidence of skin tears noted. IV catheter discontinued intact. Site without signs and symptoms of complications. Dressing and pressure applied.  An After Visit Summary was printed and given to the patient. Patient received prescription.  D/c education completed with patient/family including follow up instructions, medication list, d/c activities limitations if indicated, with other d/c instructions as indicated by MD - patient able to verbalize understanding, all questions fully answered.   Patient instructed to return to ED, call 911, or call MD for any changes in condition.   Patient escorted via [place], and D/C home via private auto.  Holley Raring 02/02/2018 2:06 PM

## 2018-02-02 NOTE — Progress Notes (Signed)
Patient ambulated in the hallway, he was leaning to the left and he had weakness in his right arm. Patient tries to walk fast and is unsteady.

## 2018-02-02 NOTE — Plan of Care (Signed)
Progressing

## 2018-02-02 NOTE — Evaluation (Signed)
Physical Therapy Evaluation Patient Details Name: Michael Simon MRN: [id number] DOB: 06-25-1933 Today's Date: 02/02/2018   History of Present Illness  84 y.o. male admitted on 02/01/18 for weakness and confusion.  Pt with elevated temp.  Dx wiith acute metabolic encepholopathy.  Pt with significant PMH of DM2, LBBB, HTN, CAD, bil TKA, and CABG.    Clinical Impression  Pt is not at his cognitive or physical baseline and is currently at very high risk of falling.  His primary support at home, his wife, also uses a RW and cannot physically assist him.  I would like to see if one more night here, some rest, and some food (apparently he did not get any lunch) will help both his physical appearance and his cognitive state.  He would benefit from max  [facility] services at discharge (PT, OT, aide).   PT to follow acutely for deficits listed below.       Follow Up Recommendations Home health PT;Supervision/Assistance - 24 hour    Equipment Recommendations  Rolling walker with 5" wheels    Recommendations for Other Services OT consult     Precautions / Restrictions Precautions Precautions: Fall Precaution Comments: impulsive, leans      Mobility  Bed Mobility Overal bed mobility: Needs Assistance Bed Mobility: Supine to Sit;Sit to Supine     Supine to sit: Supervision Sit to supine: Supervision   General bed mobility comments: supervision for safety as pt is moving far too quickly to be safe  Transfers Overall transfer level: Needs assistance Equipment used: Rolling walker (2 wheeled);1 person hand held assist Transfers: Sit to/from Stand Sit to Stand: Min assist         General transfer comment: Min assist both with and without RW to support trunk to get to standing.   Ambulation/Gait Ambulation/Gait assistance: Min assist;Mod assist;+2 safety/equipment Ambulation Distance (Feet): 150 Feet(x2 once with no AD and once with RW) Assistive device: Rolling walker (2 wheeled);None;2  person hand held assist Gait Pattern/deviations: Step-through pattern;Trunk flexed;Drifts right/left Gait velocity: too fast to be safe Gait velocity interpretation: at or above normal speed for age/gender General Gait Details: Pt with ever growing right lateral lean as he fatigued.  He was unable to compensate for the lean and nearly fell over.  He was reaching for every object in the hallway for stability and was finally safest with two person hand held assist.  We came back to the room and switch out to the RW and he was significantly more safe with RW, however, with fatigue again, he would start to lean dangerously outside of his BOS to the right.  I advised his son that he would not be safe to walk even with the walker without someone right there with him.       Balance Overall balance assessment: Needs assistance Sitting-balance support: Feet supported;No upper extremity supported Sitting balance-Leahy Scale: Poor Sitting balance - Comments: after walking right lateral lean was so bad that he needed min to mod assist to maintain midline EOB.    Standing balance support: Bilateral upper extremity supported;No upper extremity supported Standing balance-Leahy Scale: Poor Standing balance comment: needs external support for balance.                              Pertinent Vitals/Pain Pain Assessment: No/denies pain    Home Living Family/patient expects to be discharged to:: Private residence Living Arrangements: Spouse/significant other;Children Available Help at Discharge: Family;Available 24  hours/day;Other (Comment)(wife is there all the time, but she uses a RW herself) Type of Home: House Home Access: Stairs to enter Entrance Stairs-Rails: Right Entrance Stairs-Number of Steps: 2 Home Layout: One level Home Equipment: Shower seat      Prior Function Level of Independence: Independent         Comments: Pt usually ambulates independently, drives, is cognitively  intact     Hand Dominance   Dominant Hand: Right    Extremity/Trunk Assessment   Upper Extremity Assessment Upper Extremity Assessment: Generalized weakness    Lower Extremity Assessment Lower Extremity Assessment: Generalized weakness(no obvious asymmetry)    Cervical / Trunk Assessment Cervical / Trunk Assessment: Other exceptions Cervical / Trunk Exceptions: Per son he does normally have a slight right lean, but not as significant as was seen today.   Communication   Communication: No difficulties  Cognition Arousal/Alertness: Awake/alert Behavior During Therapy: Impulsive Overall Cognitive Status: Impaired/Different from baseline Area of Impairment: Orientation;Attention;Memory;Safety/judgement;Following commands;Awareness;Problem solving                 Orientation Level: Disoriented to;Person;Place;Time;Situation(called his son his brother, Jan 2nd, 2000) Current Attention Level: Sustained   Following Commands: Follows one step commands consistently Safety/Judgement: Decreased awareness of safety;Decreased awareness of deficits Awareness: Intellectual Problem Solving: Difficulty sequencing;Requires verbal cues;Requires tactile cues General Comments: Pt not aware of his right lateral lean and unable at times to correct for it.  He was unable to tell me he was in the hospital, only that he is in [place].   He reported the president as Obama and moved far  too quickly given his deficits to be safe.       General Comments General comments (skin integrity, edema, etc.): Gross visual assessment without obvious signs of nystagmus (pt having difficulty maintaining attention to task and following more complicated commands).  Pt did not report any dizziness.         Assessment/Plan    PT Assessment Patient needs continued PT services  PT Problem List Decreased strength;Decreased activity tolerance;Decreased balance;Decreased mobility;Decreased cognition;Decreased  knowledge of use of DME;Decreased safety awareness;Decreased knowledge of precautions       PT Treatment Interventions DME instruction;Gait training;Functional mobility training;Stair training;Therapeutic activities;Balance training;Therapeutic exercise;Neuromuscular re-education;Cognitive remediation;Patient/family education    PT Goals (Current goals can be found in the Care Plan section)  Acute Rehab PT Goals Patient Stated Goal: to go home PT Goal Formulation: With patient/family Time For Goal Achievement: 02/16/18 Potential to Achieve Goals: Good    Frequency Min 3X/week   Barriers to discharge Decreased caregiver support wife uses a RW as well.        AM-PAC PT "6 Clicks" Daily Activity  Outcome Measure Difficulty turning over in bed (including adjusting bedclothes, sheets and blankets)?: None Difficulty moving from lying on back to sitting on the side of the bed? : None Difficulty sitting down on and standing up from a chair with arms (e.g., wheelchair, bedside commode, etc,.)?: Unable Help needed moving to and from a bed to chair (including a wheelchair)?: A Little Help needed walking in hospital room?: A Little Help needed climbing 3-5 steps with a railing? : A Lot 6 Click Score: 17    End of Session Equipment Utilized During Treatment: Gait belt Activity Tolerance: Patient limited by fatigue Patient left: in bed;with call bell/phone within reach;with family/visitor present Nurse Communication: Mobility status PT Visit Diagnosis: Unsteadiness on feet (R26.81);Muscle weakness (generalized) (M62.81);Difficulty in walking, not elsewhere classified (R26.2)    Time: 2248-2500  PT Time Calculation (min) (ACUTE ONLY): 35 min   Charges:         Wells Guiles B. Deysy Schabel, PT, DPT [phone]    PT Evaluation $PT Eval Moderate Complexity: 1 Mod PT Treatments $Gait Training: 8-22 mins   02/02/2018, 3:54 PM

## 2018-02-02 NOTE — Discharge Summary (Signed)
Physician Discharge Summary  Michael Simon UYQ:[id number] DOB: 1933/09/01 DOA: 02/01/2018  PCP: Teodoro Spray, MD  Admit date: 02/01/2018 Discharge date: 02/02/2018  Admitted From: Home Disposition:  Home  Recommendations for Outpatient Follow-up and new medication changes:  1. Follow up with PCP in 1- weeks  Home Health: no  Equipment/Devices: n   Discharge Condition: Stable CODE STATUS: Full code Diet recommendation: heart healthy  Brief/Interim Summary: 84 year old male who presented with weakness.  Patient does have the significant past medical history of coronary artery disease, type 2 diabetes mellitus, and hypertension.  He was found to be confused and weak at home, he was brought to the hospital for further evaluation.  2 days prior to hospitalization he had a right earlobe abscess drained.  On the initial physical examination his temperature was 100.8, blood pressure 132/45, heart rate 81, respiratory rate 23, oxygen saturation 95%.  The patient was awake and alert, nonfocal, lungs clear to auscultation, heart sounds S1-S2 present rhythmic, the abdomen was soft nontender, no lower extremity edema.  Sodium 137, potassium 4.3, chloride 102, bicarb 22, glucose 127, BUN 25, creatinine 1.23, white count 9.6, hemoglobin 11.0, hematocrit 34.8, platelets 223.  Urinalysis negative for infection.  Influenza negative.  Chest x-ray with hyperinflation, increased lung markings bilaterally, no infiltrates.  EKG sinus rhythm, left axis deviation, left bundle branch block, left atrial enlargement, poor R wave progression, lateral T wave inversion.   Patient was admitted to the hospital with the working diagnosis of acute metabolic encephalopathy to rule out sepsis.   1.  Acute metabolic encephalopathy.  Patient received IV fluids, neurochecks, infection was ruled out, antibiotics were discontinued.  Patient is back to his normal self, he will be discharged home and follow-up as an outpatient.    2.  Hypertension.  Remain well controlled, continue metoprolol and lisinopril.  3.  Type 2 diabetes mellitus.  Patient was placed on insulin sliding scale, his Blood glucose remained well controlled, patient will resume metformin at discharge.  4.  Iron deficiency anemia.  Hemoglobin hematocrit remained stable, continue iron supplements.  5.  Dyslipidemia.  Continue lovastatin.     Discharge Diagnoses:  Active Problems:   Type 2 diabetes mellitus (HCC)   HTN (hypertension)   CAD (coronary artery disease)   Weakness   Abscess of right earlobe   Generalized weakness    Discharge Instructions   Allergies as of 02/02/2018   No Known Allergies     Medication List    STOP taking these medications   HYDROcodone-acetaminophen 5-325 MG tablet Commonly known as:  NORCO/VICODIN   valACYclovir 1000 MG tablet Commonly known as:  VALTREX     TAKE these medications   acetaminophen 325 MG tablet Commonly known as:  TYLENOL Take 2 tablets (650 mg total) by mouth every 6 (six) hours as needed for mild pain (or Fever >/= 101).   aspirin EC 81 MG tablet Take by mouth.   iron polysaccharides 150 MG capsule Commonly known as:  NIFEREX Take 150 mg by mouth daily.   lisinopril 20 MG tablet Commonly known as:  PRINIVIL,ZESTRIL Take 20 mg by mouth daily.   lovastatin 40 MG tablet Commonly known as:  MEVACOR Take 40 mg by mouth at bedtime.   metFORMIN 500 MG 24 hr tablet Commonly known as:  GLUCOPHAGE-XR Take 1,000 mg by mouth 2 (two) times daily.   metoprolol tartrate 25 MG tablet Commonly known as:  LOPRESSOR Take 12.5 mg by mouth 2 (two) times daily.  pantoprazole 40 MG tablet Commonly known as:  PROTONIX Take 1 tablet (40 mg total) by mouth daily.   RA VITAMIN B-12 TR 1000 MCG Tbcr Generic drug:  Cyanocobalamin Take by mouth.       No Known Allergies  Consultations:     Procedures/Studies: Dg Chest 2 View  Result Date: 02/01/2018 CLINICAL DATA:   Confusion and fever EXAM: CHEST  2 VIEW COMPARISON:  Chest radiograph 05/25/2011 FINDINGS: Remote median sternotomy for CABG. Discontinuity of multiple median sternotomy wires. No focal airspace consolidation or pulmonary edema. No pleural effusion or pneumothorax. IMPRESSION: 1. No acute cardiopulmonary disease. 2. Multifocal discontinuity of median sternotomy wires, new compared to 05/25/2011. Has there been an interval repeat sternotomy? Electronically Signed   By: Ulyses Jarred M.D.   On: 02/01/2018 18:43   Ct Head Wo Contrast  Result Date: 02/01/2018 CLINICAL DATA:  Altered level of consciousness. Confusion times 3-4 days. EXAM: CT HEAD WITHOUT CONTRAST TECHNIQUE: Contiguous axial images were obtained from the base of the skull through the vertex without intravenous contrast. COMPARISON:  04/19/2007 FINDINGS: Brain: Chronic moderate small vessel ischemic disease of periventricular and subcortical white matter. Mild involutional changes of brain with ventriculomegaly and mild superficial atrophy. No large vascular territory infarct, hemorrhage or midline shift. No intra-axial mass nor extra-axial fluid collections. Vascular: Moderate atherosclerotic calcifications of the carotid siphons. Hyperdense vessels. Skull: Developmental incomplete posterior arch of C1. Sinuses/Orbits: Intact orbits and globes. Bilateral lens replacements. No acute sinus or mastoid disease. Other: None IMPRESSION: Atrophy with chronic moderate small vessel ischemic disease. No acute intracranial abnormality. Electronically Signed   By: Ashley Royalty M.D.   On: 02/01/2018 23:32       Subjective: Patient is feeling better, no nausea no vomiting, no fevers no chills.  Discharge Exam: Vitals:   02/02/18 0607 02/02/18 0853  BP: (!) 128/35 (!) 142/54  Pulse: 60 62  Resp: 18   Temp: 97.8 F (36.6 C)   SpO2: 100%    Vitals:   02/02/18 0050 02/02/18 0130 02/02/18 0607 02/02/18 0853  BP:  (!) 121/53 (!) 128/35 (!) 142/54   Pulse:  63 60 62  Resp:  18 18   Temp: 98.2 F (36.8 C)  97.8 F (36.6 C)   TempSrc:      SpO2:  96% 100%   Weight:  70.3 kg (154 lb 14.4 oz)    Height:  5\' 6"  (1.676 m)      General: Not in pain or dyspnea,  Neurology: Awake and alert, non focal  E ENT: no pallor, no icterus, oral mucosa moist Cardiovascular: No JVD. S1-S2 present, rhythmic, no gallops, rubs, or murmurs. No lower extremity edema. Pulmonary: vesicular breath sounds bilaterally, adequate air movement, no wheezing, rhonchi or rales. Gastrointestinal. Abdomen flat, no organomegaly, non tender, no rebound or guarding Skin. No rashes Musculoskeletal: no joint deformities   The results of significant diagnostics from this hospitalization (including imaging, microbiology, ancillary and laboratory) are listed below for reference.     Microbiology: No results found for this or any previous visit (from the past 240 hour(s)).   Labs: BNP (last 3 results) No results for input(s): BNP in the last 8760 hours. Basic Metabolic Panel: Recent Labs  Lab 01/30/18 0837 02/01/18 1808 02/02/18 0213  NA 135 137 141  K 3.9 4.3 3.9  CL 99* 102 105  CO2 26 22 23   GLUCOSE 209* 127* 85  BUN 18 25* 21*  CREATININE 1.04 1.23 1.14  CALCIUM 8.8* 8.9 8.3*  MG  --   --  1.3*   Liver Function Tests: Recent Labs  Lab 01/30/18 0837 02/01/18 1808  AST 19 18  ALT 9* 11*  ALKPHOS 53 55  BILITOT 0.8 0.7  PROT 7.0 7.1  ALBUMIN 4.0 3.9   No results for input(s): LIPASE, AMYLASE in the last 168 hours. No results for input(s): AMMONIA in the last 168 hours. CBC: Recent Labs  Lab 01/30/18 0837 02/01/18 1808 02/01/18 2009 02/02/18 0213  WBC 7.0 9.6  --  7.2  NEUTROABS 5.3  --  6.4  --   HGB 10.8* 11.0*  --  10.0*  HCT 33.3* 34.8*  --  30.8*  MCV 90.7 92.3  --  92.2  PLT 203 223  --  197   Cardiac Enzymes: Recent Labs  Lab 01/30/18 0837 02/02/18 0213  CKTOTAL  --  113  TROPONINI <0.03 <0.03   BNP: Invalid  input(s): POCBNP CBG: Recent Labs  Lab 01/30/18 0834 02/02/18 0216 02/02/18 0745 02/02/18 1204  GLUCAP 173* 79 84 94   D-Dimer No results for input(s): DDIMER in the last 72 hours. Hgb A1c No results for input(s): HGBA1C in the last 72 hours. Lipid Profile No results for input(s): CHOL, HDL, LDLCALC, TRIG, CHOLHDL, LDLDIRECT in the last 72 hours. Thyroid function studies Recent Labs    02/02/18 0213  TSH 3.470   Anemia work up No results for input(s): VITAMINB12, FOLATE, FERRITIN, TIBC, IRON, RETICCTPCT in the last 72 hours. Urinalysis    Component Value Date/Time   COLORURINE YELLOW 02/01/2018 1804   APPEARANCEUR CLEAR 02/01/2018 1804   LABSPEC 1.024 02/01/2018 1804   PHURINE 5.0 02/01/2018 1804   GLUCOSEU NEGATIVE 02/01/2018 1804   HGBUR NEGATIVE 02/01/2018 1804   BILIRUBINUR NEGATIVE 02/01/2018 1804   KETONESUR NEGATIVE 02/01/2018 1804   PROTEINUR 100 (A) 02/01/2018 1804   NITRITE NEGATIVE 02/01/2018 1804   LEUKOCYTESUR NEGATIVE 02/01/2018 1804   Sepsis Labs Invalid input(s): PROCALCITONIN,  WBC,  LACTICIDVEN Microbiology No results found for this or any previous visit (from the past 240 hour(s)).   Time coordinating discharge: 45 minutes  SIGNED:   Tawni Millers, MD  [facility] 02/02/2018, 12:47 PM Pager [phone]  If 7PM-7AM, please contact night-coverage [email or web address] Password [email or web address]

## 2018-02-02 NOTE — Progress Notes (Signed)
Patient received from ED via bed. Patient is alert and oriented x2. Vital signs are stable. Skin assessment done with another nurse. Patient is on telemetry and iv in place . Patient given instruction about call bell and  phone. Bed in low position and side rail up x2. Call bell in reach.

## 2018-02-02 NOTE — Progress Notes (Signed)
MD notified of blood pressure of 142/54 and heart rate 62. Asked if I should hold metoprolol and lisinopril. MD said to give both.

## 2018-02-03 DIAGNOSIS — M6281 Muscle weakness (generalized): Secondary | ICD-10-CM | POA: Diagnosis not present

## 2018-02-03 LAB — GLUCOSE, CAPILLARY
GLUCOSE-CAPILLARY: 105 mg/dL — AB (ref 65–99)
GLUCOSE-CAPILLARY: 155 mg/dL — AB (ref 65–99)

## 2018-02-03 NOTE — Progress Notes (Signed)
Physical Therapy Treatment Patient Details Name: Michael Simon MRN: [id number] DOB: Apr 21, 1933 Today's Date: 02/03/2018    History of Present Illness 84 y.o. male admitted on 02/01/18 for weakness and confusion.  Pt with elevated temp.  Dx wiith acute metabolic encepholopathy.  Pt with significant PMH of DM2, LBBB, HTN, CAD, bil TKA, and CABG.      PT Comments    Pt mobilizing with less assistance than yesterday, however continues to need constant supervision with mobility due to occasional LoB which requires additional assistance to steady himself, especially with transitions away from RW to stair rails. Pt continues to have increasing R lateral lean as he fatigues with ambulation whish also requires assist to steady. PT discussed with son need for 24 hr supervision and use of RW at discharge to protect against falls. Son reports his sister will be there 24 hr/day. PT will require HHPT at discharge to aid in returning to his PLOF.        Follow Up Recommendations  Home health PT;Supervision/Assistance - 24 hour     Equipment Recommendations  Rolling walker with 5" wheels    Recommendations for Other Services OT consult     Precautions / Restrictions Precautions Precautions: Fall Precaution Comments: impulsive, leans    Mobility  Bed Mobility Overal bed mobility: Needs Assistance Bed Mobility: Supine to Sit;Sit to Supine     Supine to sit: Supervision     General bed mobility comments: supervision for safety, moves with appropriate speed today and stays seated once at EoB  Transfers Overall transfer level: Needs assistance Equipment used: Rolling walker (2 wheeled);1 person hand held assist Transfers: Sit to/from Stand Sit to Stand: Min guard         General transfer comment: min guard for safety, vc for hand placement for power up, pt able to steady himself with RW  Ambulation/Gait Ambulation/Gait assistance: Min assist Ambulation Distance (Feet): 200  Feet Assistive device: Rolling walker (2 wheeled);None;2 person hand held assist Gait Pattern/deviations: Step-through pattern;Trunk flexed;Drifts right/left Gait velocity: too fast to be safe Gait velocity interpretation: at or above normal speed for age/gender General Gait Details: minA for steadying with RW, pt with increasing R lateral lean as he fatigues, pt did request 1x standing rest break and was able to maintain more upright posture after resting. Continue to advise son that he requires standby assist with all ambulation    Stairs Stairs: Yes   Stair Management: Two rails;Forwards;Step to pattern Number of Stairs: 2 General stair comments: Pt had LoB requiring modA to steady when transitioning from RW to handrail of steps. Pt continues to have decreased awareness of his deficits with activities he was previously independent with.       Balance Overall balance assessment: Needs assistance Sitting-balance support: Feet supported;No upper extremity supported Sitting balance-Leahy Scale: Fair Sitting balance - Comments: able to maintain upright after coming EoB and when sitting in recliner    Standing balance support: Bilateral upper extremity supported;No upper extremity supported Standing balance-Leahy Scale: Poor Standing balance comment: needs external support for balance.                             Cognition Arousal/Alertness: Awake/alert Behavior During Therapy: Impulsive Overall Cognitive Status: Impaired/Different from baseline Area of Impairment: Orientation;Attention;Memory;Safety/judgement;Following commands;Awareness;Problem solving                 Orientation Level: Disoriented to;Place;Time;Situation(thinks he is in [place] but knows he is  the hospital ) Current Attention Level: Selective   Following Commands: Follows one step commands consistently Safety/Judgement: Decreased awareness of safety;Decreased awareness of deficits Awareness:  Intellectual Problem Solving: Difficulty sequencing;Requires verbal cues;Requires tactile cues General Comments: Pt recognizes his son, is aware he is in the hospital and reports it is spring. Continues to have R lateral lean which increass with fatigue, continues to ambulate too quickly for conditions         General Comments General comments (skin integrity, edema, etc.): Pt son present at end of session and reports that his sister will be there 24 hrs/day at discharge to aid in mobility. Discussed need for constant supervision as pt is impulsive with his movements.       Pertinent Vitals/Pain Pain Assessment: No/denies pain           PT Goals (current goals can now be found in the care plan section) Acute Rehab PT Goals Patient Stated Goal: to go home PT Goal Formulation: With patient/family Time For Goal Achievement: 02/16/18 Potential to Achieve Goals: Good    Frequency    Min 3X/week      PT Plan Current plan remains appropriate       AM-PAC PT "6 Clicks" Daily Activity  Outcome Measure  Difficulty turning over in bed (including adjusting bedclothes, sheets and blankets)?: None Difficulty moving from lying on back to sitting on the side of the bed? : None Difficulty sitting down on and standing up from a chair with arms (e.g., wheelchair, bedside commode, etc,.)?: Unable Help needed moving to and from a bed to chair (including a wheelchair)?: A Little Help needed walking in hospital room?: A Little Help needed climbing 3-5 steps with a railing? : A Lot 6 Click Score: 17    End of Session Equipment Utilized During Treatment: Gait belt Activity Tolerance: Patient limited by fatigue Patient left: in bed;with call bell/phone within reach;with family/visitor present Nurse Communication: Mobility status PT Visit Diagnosis: Unsteadiness on feet (R26.81);Muscle weakness (generalized) (M62.81);Difficulty in walking, not elsewhere classified (R26.2)     Time:  7026-3785 PT Time Calculation (min) (ACUTE ONLY): 24 min  Charges:  $Gait Training: 23-37 mins                    G Codes:       Law Corsino B. Migdalia Dk PT, DPT Acute Rehabilitation  [phone] Pager [phone]     [place] 02/03/2018, 9:03 AM

## 2018-02-03 NOTE — Evaluation (Signed)
Occupational Therapy Evaluation Patient Details Name: Michael Simon MRN: [id number] DOB: 07-07-1933 Today's Date: 02/03/2018    History of Present Illness 84 y.o. male admitted on 02/01/18 for weakness and confusion.  Pt with elevated temp.  Dx wiith acute metabolic encepholopathy.  Pt with significant PMH of DM2, LBBB, HTN, CAD, bil TKA, and CABG.     Clinical Impression   PTA, pt ws living alone and was independent with ADLs, light IADLs, and driving. Pt family reports that someone can be available 24/7 initially. Pt requiring Min A for LB ADLs due to posterior lean and poor balance. Pt demonstrating poor safety awareness and understanding of balance deficits stating "my balance is great!" after falling back onto bed during LB dressing. Pt at risk for falls at home. Recommend dc home with 24 hour supervision and HHOT to optimize safety and independence with ADLs and functional mobility. All education and pt questions answered in preparation for dc later today.     Follow Up Recommendations  Home health OT;Supervision/Assistance - 24 hour    Equipment Recommendations  None recommended by OT    Recommendations for Other Services PT consult     Precautions / Restrictions Precautions Precautions: Fall Precaution Comments: impulsive, leans Restrictions Weight Bearing Restrictions: No      Mobility Bed Mobility               General bed mobility comments: Pt at EOB upon arrival with bed alarm going off. Family in room  Transfers Overall transfer level: Needs assistance Equipment used: Rolling walker (2 wheeled);1 person hand held assist Transfers: Sit to/from Stand Sit to Stand: Min guard         General transfer comment: min guard for safety, vc for hand placement for power up, pt able to steady himself with RW    Balance Overall balance assessment: Needs assistance Sitting-balance support: Feet supported;No upper extremity supported Sitting balance-Leahy Scale:  Fair Sitting balance - Comments: able to maintain upright after coming EoB and when sitting in recliner    Standing balance support: Bilateral upper extremity supported;No upper extremity supported Standing balance-Leahy Scale: Poor Standing balance comment: needs external support for balance.                            ADL either performed or assessed with clinical judgement   ADL Overall ADL's : Needs assistance/impaired Eating/Feeding: Set up;Sitting   Grooming: Set up;Supervision/safety;Standing   Upper Body Bathing: Set up;Supervision/ safety;Sitting   Lower Body Bathing: Min guard;Minimal assistance;Sit to/from stand Lower Body Bathing Details (indicate cue type and reason): MIn A for posterior lean Upper Body Dressing : Set up;Supervision/safety;Sitting;With caregiver independent assisting Upper Body Dressing Details (indicate cue type and reason): donned shirt. assistance fortime management and buttoning shirt by son Lower Body Dressing: Min guard;Minimal assistance;Sit to/from stand Lower Body Dressing Details (indicate cue type and reason): Donned pants and underwear with generally MIn GUard for safety. Pt requiring MIn A for posterior lean and with single LOB falling back on EOB. Pt not demosntrating understanding of balance deficits. Toilet Transfer: Min guard;RW;Ambulation           Functional mobility during ADLs: Min guard;Rolling walker;Cueing for sequencing;Cueing for safety General ADL Comments: Pt demonstrating decreased safety awarness and poor balance.      Vision         Perception     Praxis      Pertinent Vitals/Pain Pain Assessment: No/denies pain  Hand Dominance Right   Extremity/Trunk Assessment Upper Extremity Assessment Upper Extremity Assessment: Generalized weakness   Lower Extremity Assessment Lower Extremity Assessment: Generalized weakness   Cervical / Trunk Assessment Cervical / Trunk Assessment: Other  exceptions Cervical / Trunk Exceptions: Per son he does normally have a slight right lean, but not as significant as was seen today.    Communication Communication Communication: No difficulties   Cognition Arousal/Alertness: Awake/alert Behavior During Therapy: Impulsive Overall Cognitive Status: Impaired/Different from baseline Area of Impairment: Attention;Memory;Safety/judgement;Following commands;Awareness;Problem solving                   Current Attention Level: Selective   Following Commands: Follows multi-step commands inconsistently;Follows one step commands inconsistently Safety/Judgement: Decreased awareness of safety;Decreased awareness of deficits Awareness: Emergent Problem Solving: Requires verbal cues;Requires tactile cues;Slow processing General Comments: Pt with decreased safety awarness and awareness of deficits. Pt stating "I have great balance" after falling back onto the bed during LB dressing.    General Comments  Son present throughout session    Exercises     Shoulder [place] expects to be discharged to:: Private residence Living Arrangements: Spouse/significant other;Children Available Help at Discharge: Family;Available 24 hours/day;Other (Comment)(wife is there all the time, but she uses a RW herself) Type of Home: House Home Access: Stairs to enter [employer] of Steps: 2 Entrance Stairs-Rails: Right Home Layout: One level     Bathroom Shower/Tub: Teacher, early years/pre: Standard     Home Equipment: Grab bars - toilet;Grab bars - tub/shower;Toilet riser;Tub bench          Prior Functioning/Environment Level of Independence: Independent        Comments: ADLs and light IADLs. Pt's daughters performing cooking and cleaning. Driving PTA        OT Problem List: Decreased strength;Decreased range of motion;Decreased activity tolerance;Impaired balance (sitting and/or  standing);Decreased safety awareness;Decreased knowledge of use of DME or AE;Decreased knowledge of precautions      OT Treatment/Interventions:      OT Goals(Current goals can be found in the care plan section) Acute Rehab OT Goals Patient Stated Goal: to go home OT Goal Formulation: All assessment and education complete, DC therapy  OT Frequency:     Barriers to D/C:            Co-evaluation              AM-PAC PT "6 Clicks" Daily Activity     Outcome Measure Help from another person eating meals?: None Help from another person taking care of personal grooming?: A Little Help from another person toileting, which includes using toliet, bedpan, or urinal?: A Little Help from another person bathing (including washing, rinsing, drying)?: A Little Help from another person to put on and taking off regular upper body clothing?: A Little Help from another person to put on and taking off regular lower body clothing?: A Little 6 Click Score: 19   End of Session Equipment Utilized During Treatment: Rolling walker Nurse Communication: Mobility status;Precautions  Activity Tolerance: Patient tolerated treatment well Patient left: in chair;with call bell/phone within reach;with chair alarm set;with family/visitor present  OT Visit Diagnosis: Unsteadiness on feet (R26.81);Other abnormalities of gait and mobility (R26.89);Muscle weakness (generalized) (M62.81);Other symptoms and signs involving cognitive function                Time: 7169-6789 OT Time Calculation (min): 25 min Charges:  OT Evaluation $OT Eval Moderate  Complexity: 1 Mod OT Treatments $Self Care/Home Management : 8-22 mins G-Codes:     Sylus Stgermain MSOT, OTR/L Acute Rehab Pager: [phone] Office: [place] 02/03/2018, 12:08 PM

## 2018-02-03 NOTE — Plan of Care (Signed)
Progressing

## 2018-02-03 NOTE — Progress Notes (Signed)
MD paged about OT recommending home health OT.

## 2018-02-03 NOTE — Progress Notes (Signed)
This morning patient is feeling back to his normal self, his son who is at bedside agrees.  Discharge was delayed due to the need of physical therapy reevaluation.  Patient was seen by physical therapist, reported improvement in his ambulation, requiring less assistance than yesterday but still needing constant supervision for mobility.    Patient has remained afebrile with stable vital signs, his physical examination is unremarkable, with moist mucous membranes, lungs clear to auscultation, heart S1-S2 present rhythmic, the abdomen soft, no lower extremity edema, nonfocal.  Patient will be discharged home today with home health services, rolling walker and a close follow-up as an outpatient.

## 2018-02-03 NOTE — Progress Notes (Signed)
Waiting on OT home health to be set up before discharge.

## 2018-02-03 NOTE — Care Management Note (Addendum)
Case Management Note Marvetta Gibbons RN, BSN Unit 4E-Case Manager-- 5W weekend coverage [phone]  Patient Details  Name: Michael Simon MRN: [id number] Date of Birth: Aug 14, 1933  Subjective/Objective:   Pt presented with weakness                 Action/Plan: PTA pt lived at home, daughter to be there to assist 24/7 at discharge, order for HHPT and RW placed for discharge- spoke with pt and son at bedside- choice offered for [facility] and DME services- per choice they will use [facility] for services- referral called to [facility] with [facility] for [facility] and DME needs- RW to be delivered to room prior to discharge.   Notified by OT that recommendation for [facility] as well will have bedside Rn notify MD and add OT to [place] with [facility] updated on [facility] needs  Expected Discharge Date:  02/03/18               Expected Discharge Plan:  [place]  In-House Referral:  NA  Discharge planning Services  CM Consult  Post Acute Care Choice:  Durable Medical Equipment, Home Health Choice offered to:  Patient, Adult Children  DME Arranged:  Walker rolling DME Agency:  [place] Arranged:  PT/OT HH Agency:  [place]  Status of Service:  Completed, signed off  If discussed at [place] of Stay Meetings, dates discussed:    Discharge Disposition: home/home health   Additional Comments:  Dawayne Patricia, RN 02/03/2018, 10:41 AM

## 2018-02-04 LAB — URINE CULTURE: Culture: 20000 — AB

## 2018-02-06 DIAGNOSIS — E1142 Type 2 diabetes mellitus with diabetic polyneuropathy: Secondary | ICD-10-CM | POA: Diagnosis not present

## 2018-02-06 DIAGNOSIS — E7800 Pure hypercholesterolemia, unspecified: Secondary | ICD-10-CM | POA: Diagnosis not present

## 2018-02-06 DIAGNOSIS — D649 Anemia, unspecified: Secondary | ICD-10-CM | POA: Diagnosis not present

## 2018-02-06 DIAGNOSIS — I10 Essential (primary) hypertension: Secondary | ICD-10-CM | POA: Diagnosis not present

## 2018-02-06 LAB — CULTURE, BLOOD (ROUTINE X 2)
CULTURE: NO GROWTH
Culture: NO GROWTH
SPECIAL REQUESTS: ADEQUATE
SPECIAL REQUESTS: ADEQUATE

## 2018-03-12 DIAGNOSIS — I2510 Atherosclerotic heart disease of native coronary artery without angina pectoris: Secondary | ICD-10-CM | POA: Diagnosis not present

## 2018-03-12 DIAGNOSIS — I447 Left bundle-branch block, unspecified: Secondary | ICD-10-CM | POA: Diagnosis not present

## 2018-03-12 DIAGNOSIS — I10 Essential (primary) hypertension: Secondary | ICD-10-CM | POA: Diagnosis not present

## 2018-03-12 DIAGNOSIS — I2584 Coronary atherosclerosis due to calcified coronary lesion: Secondary | ICD-10-CM | POA: Diagnosis not present

## 2018-04-01 ENCOUNTER — Other Ambulatory Visit: Payer: Self-pay | Admitting: Family Medicine

## 2018-04-01 DIAGNOSIS — R51 Headache: Secondary | ICD-10-CM | POA: Diagnosis not present

## 2018-04-01 DIAGNOSIS — E1121 Type 2 diabetes mellitus with diabetic nephropathy: Secondary | ICD-10-CM | POA: Diagnosis not present

## 2018-04-01 DIAGNOSIS — R410 Disorientation, unspecified: Secondary | ICD-10-CM | POA: Diagnosis not present

## 2018-04-01 DIAGNOSIS — R05 Cough: Secondary | ICD-10-CM | POA: Diagnosis not present

## 2018-04-03 ENCOUNTER — Ambulatory Visit
Admission: RE | Admit: 2018-04-03 | Discharge: 2018-04-03 | Disposition: A | Discharge status: Home / Self Care | Payer: Medicare HMO | Source: Ambulatory Visit | Attending: Family Medicine | Admitting: Family Medicine

## 2018-04-03 DIAGNOSIS — S0990XA Unspecified injury of head, initial encounter: Secondary | ICD-10-CM | POA: Diagnosis not present

## 2018-04-03 DIAGNOSIS — R410 Disorientation, unspecified: Secondary | ICD-10-CM | POA: Diagnosis present

## 2018-04-17 DIAGNOSIS — R410 Disorientation, unspecified: Secondary | ICD-10-CM | POA: Diagnosis not present

## 2018-04-17 DIAGNOSIS — E1121 Type 2 diabetes mellitus with diabetic nephropathy: Secondary | ICD-10-CM | POA: Diagnosis not present

## 2018-04-17 DIAGNOSIS — I10 Essential (primary) hypertension: Secondary | ICD-10-CM | POA: Diagnosis not present

## 2018-04-17 DIAGNOSIS — D649 Anemia, unspecified: Secondary | ICD-10-CM | POA: Diagnosis not present

## 2018-04-30 DIAGNOSIS — I10 Essential (primary) hypertension: Secondary | ICD-10-CM | POA: Diagnosis not present

## 2018-04-30 DIAGNOSIS — E1142 Type 2 diabetes mellitus with diabetic polyneuropathy: Secondary | ICD-10-CM | POA: Diagnosis not present

## 2018-04-30 DIAGNOSIS — E7800 Pure hypercholesterolemia, unspecified: Secondary | ICD-10-CM | POA: Diagnosis not present

## 2018-05-01 DIAGNOSIS — E7800 Pure hypercholesterolemia, unspecified: Secondary | ICD-10-CM | POA: Diagnosis not present

## 2018-05-01 DIAGNOSIS — I10 Essential (primary) hypertension: Secondary | ICD-10-CM | POA: Diagnosis not present

## 2018-05-01 DIAGNOSIS — E1142 Type 2 diabetes mellitus with diabetic polyneuropathy: Secondary | ICD-10-CM | POA: Diagnosis not present

## 2018-05-07 DIAGNOSIS — R809 Proteinuria, unspecified: Secondary | ICD-10-CM | POA: Diagnosis not present

## 2018-05-07 DIAGNOSIS — E1129 Type 2 diabetes mellitus with other diabetic kidney complication: Secondary | ICD-10-CM | POA: Diagnosis not present

## 2018-05-07 DIAGNOSIS — R410 Disorientation, unspecified: Secondary | ICD-10-CM | POA: Diagnosis not present

## 2018-05-07 DIAGNOSIS — E538 Deficiency of other specified B group vitamins: Secondary | ICD-10-CM | POA: Diagnosis not present

## 2018-05-07 DIAGNOSIS — R413 Other amnesia: Secondary | ICD-10-CM | POA: Diagnosis not present

## 2018-05-22 DIAGNOSIS — R410 Disorientation, unspecified: Secondary | ICD-10-CM | POA: Diagnosis not present

## 2018-05-22 DIAGNOSIS — R413 Other amnesia: Secondary | ICD-10-CM | POA: Diagnosis not present

## 2018-05-30 ENCOUNTER — Inpatient Hospital Stay
Admission: EM | Admit: 2018-05-30 | Discharge: 2018-06-02 | DRG: 872 | Disposition: A | Discharge status: Home Health | Payer: Medicare HMO | Attending: Specialist | Admitting: Specialist

## 2018-05-30 ENCOUNTER — Emergency Department: Payer: Medicare HMO

## 2018-05-30 ENCOUNTER — Other Ambulatory Visit: Payer: Self-pay

## 2018-05-30 DIAGNOSIS — I10 Essential (primary) hypertension: Secondary | ICD-10-CM | POA: Diagnosis present

## 2018-05-30 DIAGNOSIS — F17220 Nicotine dependence, chewing tobacco, uncomplicated: Secondary | ICD-10-CM | POA: Diagnosis present

## 2018-05-30 DIAGNOSIS — J9811 Atelectasis: Secondary | ICD-10-CM | POA: Diagnosis present

## 2018-05-30 DIAGNOSIS — E119 Type 2 diabetes mellitus without complications: Secondary | ICD-10-CM | POA: Diagnosis present

## 2018-05-30 DIAGNOSIS — I2510 Atherosclerotic heart disease of native coronary artery without angina pectoris: Secondary | ICD-10-CM | POA: Diagnosis present

## 2018-05-30 DIAGNOSIS — K219 Gastro-esophageal reflux disease without esophagitis: Secondary | ICD-10-CM | POA: Diagnosis present

## 2018-05-30 DIAGNOSIS — Z8249 Family history of ischemic heart disease and other diseases of the circulatory system: Secondary | ICD-10-CM | POA: Diagnosis not present

## 2018-05-30 DIAGNOSIS — R531 Weakness: Secondary | ICD-10-CM | POA: Diagnosis present

## 2018-05-30 DIAGNOSIS — A419 Sepsis, unspecified organism: Secondary | ICD-10-CM | POA: Diagnosis present

## 2018-05-30 DIAGNOSIS — R42 Dizziness and giddiness: Secondary | ICD-10-CM | POA: Diagnosis not present

## 2018-05-30 DIAGNOSIS — Z951 Presence of aortocoronary bypass graft: Secondary | ICD-10-CM | POA: Diagnosis not present

## 2018-05-30 DIAGNOSIS — Z7982 Long term (current) use of aspirin: Secondary | ICD-10-CM | POA: Diagnosis not present

## 2018-05-30 DIAGNOSIS — E785 Hyperlipidemia, unspecified: Secondary | ICD-10-CM | POA: Diagnosis present

## 2018-05-30 DIAGNOSIS — R748 Abnormal levels of other serum enzymes: Secondary | ICD-10-CM | POA: Diagnosis not present

## 2018-05-30 DIAGNOSIS — R509 Fever, unspecified: Secondary | ICD-10-CM | POA: Diagnosis present

## 2018-05-30 DIAGNOSIS — I248 Other forms of acute ischemic heart disease: Secondary | ICD-10-CM | POA: Diagnosis present

## 2018-05-30 DIAGNOSIS — Z8261 Family history of arthritis: Secondary | ICD-10-CM

## 2018-05-30 DIAGNOSIS — I6523 Occlusion and stenosis of bilateral carotid arteries: Secondary | ICD-10-CM | POA: Diagnosis not present

## 2018-05-30 DIAGNOSIS — N179 Acute kidney failure, unspecified: Secondary | ICD-10-CM | POA: Diagnosis present

## 2018-05-30 DIAGNOSIS — R410 Disorientation, unspecified: Secondary | ICD-10-CM | POA: Diagnosis present

## 2018-05-30 LAB — COMPREHENSIVE METABOLIC PANEL
ALK PHOS: 66 U/L (ref 38–126)
ALT: 12 U/L — AB (ref 17–63)
AST: 25 U/L (ref 15–41)
Albumin: 4.2 g/dL (ref 3.5–5.0)
Anion gap: 12 (ref 5–15)
BILIRUBIN TOTAL: 0.9 mg/dL (ref 0.3–1.2)
BUN: 18 mg/dL (ref 6–20)
CALCIUM: 8.9 mg/dL (ref 8.9–10.3)
CO2: 26 mmol/L (ref 22–32)
CREATININE: 1.23 mg/dL (ref 0.61–1.24)
Chloride: 100 mmol/L — ABNORMAL LOW (ref 101–111)
GFR calc Af Amer: >60 mL/min (ref >60)
GFR, EST NON AFRICAN AMERICAN: 53 mL/min — AB (ref >60)
Glucose, Bld: 149 mg/dL — ABNORMAL HIGH (ref 65–99)
Potassium: 3.7 mmol/L (ref 3.5–5.1)
Sodium: 138 mmol/L (ref 135–145)
TOTAL PROTEIN: 7.7 g/dL (ref 6.5–8.1)

## 2018-05-30 LAB — DIFFERENTIAL
BASOS ABS: 0 10*3/uL (ref 0–0.1)
BASOS PCT: 0 %
Eosinophils Absolute: 0 10*3/uL (ref 0–0.7)
Eosinophils Relative: 0 %
LYMPHS ABS: 0.3 10*3/uL — AB (ref 1.0–3.6)
Lymphocytes Relative: 3 %
MONO ABS: 0.5 10*3/uL (ref 0.2–1.0)
MONOS PCT: 5 %
NEUTROS ABS: 8.8 10*3/uL — AB (ref 1.4–6.5)
Neutrophils Relative %: 92 %

## 2018-05-30 LAB — CBC
HEMATOCRIT: 36.1 % — AB (ref 40.0–52.0)
HEMOGLOBIN: 12.1 g/dL — AB (ref 13.0–18.0)
MCH: 30.7 pg (ref 26.0–34.0)
MCHC: 33.5 g/dL (ref 32.0–36.0)
MCV: 91.6 fL (ref 80.0–100.0)
Platelets: 191 10*3/uL (ref 150–440)
RBC: 3.94 MIL/uL — ABNORMAL LOW (ref 4.40–5.90)
RDW: 14.6 % — AB (ref 11.5–14.5)
WBC: 9.6 10*3/uL (ref 3.8–10.6)

## 2018-05-30 LAB — TROPONIN I
TROPONIN I: 0.06 ng/mL — AB (ref <0.03)
TROPONIN I: 0.05 ng/mL — AB (ref <0.03)

## 2018-05-30 LAB — LACTIC ACID, PLASMA
LACTIC ACID, VENOUS: 1.3 mmol/L (ref 0.5–1.9)
Lactic Acid, Venous: 1 mmol/L (ref 0.5–1.9)

## 2018-05-30 LAB — GLUCOSE, CAPILLARY
GLUCOSE-CAPILLARY: 147 mg/dL — AB (ref 65–99)
Glucose-Capillary: 122 mg/dL — ABNORMAL HIGH (ref 65–99)

## 2018-05-30 LAB — INFLUENZA PANEL BY PCR (TYPE A & B)
Influenza A By PCR: NEGATIVE
Influenza B By PCR: NEGATIVE

## 2018-05-30 LAB — PROTIME-INR
INR: 1.02
Prothrombin Time: 13.3 seconds (ref 11.4–15.2)

## 2018-05-30 LAB — PROCALCITONIN: PROCALCITONIN: 0.73 ng/mL

## 2018-05-30 LAB — APTT: aPTT: 33 seconds (ref 24–36)

## 2018-05-30 MED ORDER — PRAVASTATIN SODIUM 20 MG PO TABS
40.0000 mg | ORAL_TABLET | Freq: Every day | ORAL | Status: DC
Start: 2018-05-31 — End: 2018-06-02
  Administered 2018-05-31 – 2018-06-01 (×2): 40 mg via ORAL
  Filled 2018-05-30 (×2): qty 2

## 2018-05-30 MED ORDER — LISINOPRIL 20 MG PO TABS
30.0000 mg | ORAL_TABLET | Freq: Every day | ORAL | Status: DC
  Administered 2018-06-01 – 2018-06-02 (×2): 30 mg via ORAL
  Filled 2018-05-30 (×3): qty 1

## 2018-05-30 MED ORDER — ACETAMINOPHEN 650 MG RE SUPP: 650.0000 mg | Freq: Four times a day (QID) | RECTAL | Status: DC | PRN

## 2018-05-30 MED ORDER — DOCUSATE SODIUM 100 MG PO CAPS
100.0000 mg | ORAL_CAPSULE | Freq: Two times a day (BID) | ORAL | Status: DC
  Administered 2018-05-30 – 2018-06-02 (×4): 100 mg via ORAL
  Filled 2018-05-30 (×6): qty 1

## 2018-05-30 MED ORDER — ACETAMINOPHEN 325 MG PO TABS
650.0000 mg | ORAL_TABLET | Freq: Four times a day (QID) | ORAL | Status: DC | PRN
  Administered 2018-06-02: 650 mg via ORAL
  Filled 2018-05-30: qty 2

## 2018-05-30 MED ORDER — PIPERACILLIN-TAZOBACTAM 3.375 G IVPB 30 MIN
3.3750 g | Freq: Once | INTRAVENOUS | Status: AC
  Administered 2018-05-30: 3.375 g via INTRAVENOUS
  Filled 2018-05-30: qty 50

## 2018-05-30 MED ORDER — PIPERACILLIN-TAZOBACTAM 3.375 G IVPB
3.3750 g | Freq: Three times a day (TID) | INTRAVENOUS | Status: DC
  Administered 2018-05-31 – 2018-06-02 (×7): 3.375 g via INTRAVENOUS
  Filled 2018-05-30 (×7): qty 50

## 2018-05-30 MED ORDER — PANTOPRAZOLE SODIUM 40 MG PO TBEC
40.0000 mg | DELAYED_RELEASE_TABLET | Freq: Every day | ORAL | Status: DC
  Administered 2018-06-01 – 2018-06-02 (×2): 40 mg via ORAL
  Filled 2018-05-30 (×3): qty 1

## 2018-05-30 MED ORDER — INSULIN ASPART 100 UNIT/ML ~~LOC~~ SOLN
0.0000 [IU] | Freq: Every day | SUBCUTANEOUS | Status: DC
  Administered 2018-06-01: 2 [IU] via SUBCUTANEOUS
  Filled 2018-05-30: qty 1

## 2018-05-30 MED ORDER — ACETAMINOPHEN 325 MG PO TABS: 650.0000 mg | ORAL_TABLET | Freq: Four times a day (QID) | ORAL | Status: DC | PRN

## 2018-05-30 MED ORDER — METOPROLOL TARTRATE 25 MG PO TABS
12.5000 mg | ORAL_TABLET | Freq: Two times a day (BID) | ORAL | Status: DC
  Administered 2018-05-30 – 2018-06-02 (×5): 12.5 mg via ORAL
  Filled 2018-05-30 (×6): qty 1

## 2018-05-30 MED ORDER — INSULIN ASPART 100 UNIT/ML ~~LOC~~ SOLN
0.0000 [IU] | Freq: Three times a day (TID) | SUBCUTANEOUS | Status: DC
  Administered 2018-06-01: 1 [IU] via SUBCUTANEOUS
  Administered 2018-06-01: 3 [IU] via SUBCUTANEOUS
  Filled 2018-05-30 (×2): qty 1

## 2018-05-30 MED ORDER — ADULT MULTIVITAMIN W/MINERALS CH
1.0000 | ORAL_TABLET | Freq: Every day | ORAL | Status: DC
  Administered 2018-06-01 – 2018-06-02 (×2): 1 via ORAL
  Filled 2018-05-30 (×3): qty 1

## 2018-05-30 MED ORDER — POLYSACCHARIDE IRON COMPLEX 150 MG PO CAPS
150.0000 mg | ORAL_CAPSULE | Freq: Every day | ORAL | Status: DC
  Administered 2018-06-01 – 2018-06-02 (×2): 150 mg via ORAL
  Filled 2018-05-30 (×4): qty 1

## 2018-05-30 MED ORDER — ONDANSETRON HCL 4 MG PO TABS: 4.0000 mg | ORAL_TABLET | Freq: Four times a day (QID) | ORAL | Status: DC | PRN

## 2018-05-30 MED ORDER — SODIUM CHLORIDE 0.9 % IV SOLN
INTRAVENOUS | Status: DC
  Administered 2018-05-30 – 2018-05-31 (×2): via INTRAVENOUS

## 2018-05-30 MED ORDER — ASPIRIN EC 81 MG PO TBEC
81.0000 mg | DELAYED_RELEASE_TABLET | Freq: Every day | ORAL | Status: DC
  Administered 2018-06-01 – 2018-06-02 (×2): 81 mg via ORAL
  Filled 2018-05-30 (×3): qty 1

## 2018-05-30 MED ORDER — VANCOMYCIN HCL IN DEXTROSE 1-5 GM/200ML-% IV SOLN
1000.0000 mg | Freq: Once | INTRAVENOUS | Status: AC
  Administered 2018-05-30: 1000 mg via INTRAVENOUS
  Filled 2018-05-30: qty 200

## 2018-05-30 MED ORDER — ONDANSETRON HCL 4 MG/2ML IJ SOLN: 4.0000 mg | Freq: Four times a day (QID) | INTRAMUSCULAR | Status: DC | PRN

## 2018-05-30 MED ORDER — VITAMIN B-12 1000 MCG PO TABS
1000.0000 ug | ORAL_TABLET | Freq: Every day | ORAL | Status: DC
  Administered 2018-06-01 – 2018-06-02 (×2): 1000 ug via ORAL
  Filled 2018-05-30 (×3): qty 1

## 2018-05-30 MED ORDER — ENOXAPARIN SODIUM 40 MG/0.4ML ~~LOC~~ SOLN
40.0000 mg | SUBCUTANEOUS | Status: DC
  Administered 2018-05-30 – 2018-06-01 (×3): 40 mg via SUBCUTANEOUS
  Filled 2018-05-30 (×3): qty 0.4

## 2018-05-30 MED ORDER — SODIUM CHLORIDE 0.9 % IV BOLUS
1000.0000 mL | Freq: Once | INTRAVENOUS | Status: AC
  Administered 2018-05-30: 1000 mL via INTRAVENOUS

## 2018-05-30 MED ORDER — FERROUS SULFATE 325 (65 FE) MG PO TABS
325.0000 mg | ORAL_TABLET | Freq: Every day | ORAL | Status: DC
  Administered 2018-06-01 – 2018-06-02 (×2): 325 mg via ORAL
  Filled 2018-05-30 (×4): qty 1

## 2018-05-30 MED ORDER — VANCOMYCIN HCL 10 G IV SOLR
1250.0000 mg | INTRAVENOUS | Status: DC
  Administered 2018-05-31 (×2): 1250 mg via INTRAVENOUS
  Filled 2018-05-30 (×3): qty 1250

## 2018-05-30 NOTE — Progress Notes (Signed)
CODE STROKE- PHARMACY COMMUNICATION   Time CODE STROKE called/page received:[id number]  Time response to CODE STROKE was made (in person or via phone): 1815  Time Stroke Kit retrieved from [place] (only if needed):  Name of Provider/Nurse contacted: Dajea  Past Medical History:  Diagnosis Date  . CAD (coronary artery disease)   . HLD (hyperlipidemia)   . HTN (hypertension)   . LBBB (left bundle branch block)   . Osteoarthritis   . Type II diabetes mellitus ([place])    Prior to Admission medications   Medication Sig Start Date End Date Taking? Authorizing Provider  acetaminophen (TYLENOL) 325 MG tablet Take 2 tablets (650 mg total) by mouth every 6 (six) hours as needed for mild pain (or Fever >/= 101). 02/02/18  Yes Arrien, Jimmy Picket, MD  aspirin EC 81 MG tablet Take 81 mg by mouth daily.    Yes [provider]  Cyanocobalamin (RA VITAMIN B-12 TR) 1000 MCG TBCR Take 1,000 mcg by mouth daily.    Yes [provider]  Ferrous Sulfate (SLOW FE) 142 (45 Fe) MG TBCR Take 1 tablet by mouth daily.   Yes [provider]  iron polysaccharides (NIFEREX) 150 MG capsule Take 150 mg by mouth daily.   Yes [provider]  lisinopril (PRINIVIL,ZESTRIL) 30 MG tablet Take 30 mg by mouth daily.    Yes [provider]  lovastatin (MEVACOR) 40 MG tablet Take 40 mg by mouth at bedtime.   Yes [provider]  metoprolol tartrate (LOPRESSOR) 25 MG tablet Take 12.5 mg by mouth 2 (two) times daily.   Yes [provider]  Multiple Vitamins-Minerals (CENTRUM SILVER) tablet Take 1 tablet by mouth daily. 02/11/18  Yes [provider]  pantoprazole (PROTONIX) 40 MG tablet Take 1 tablet (40 mg total) by mouth daily. 07/28/15  Yes Glendon Axe, MD  iron polysaccharides (NIFEREX) 150 MG capsule Take 150 mg by mouth daily.  05/30/18  [provider]  metFORMIN (GLUCOPHAGE-XR) 500 MG 24 hr tablet Take 1,000 mg by mouth 2 (two) times daily.    05/30/18  [provider]    Napoleon Form ,PharmD Clinical Pharmacist  05/30/2018  7:28 PM

## 2018-05-30 NOTE — H&P (Signed)
[facility] [place] NAME: Michael Simon    MR#:  [id number]  DATE OF BIRTH:  November 08, 1933  DATE OF ADMISSION:  05/30/2018  PRIMARY CARE PHYSICIAN: Teodoro Spray, MD   REQUESTING/REFERRING PHYSICIAN: Orbie Pyo, MD  CHIEF COMPLAINT:  Generalized weakness, fever  HISTORY OF PRESENT ILLNESS:  Michael Simon  is a 84 y.o. male with a known history of coronary artery disease status post CABG, hyperlipidemia hypertension, diabetes mellitus not on any medications, left bundle branch block history is presenting to the ED with a chief complaint of weakness.  Patient is brought in to the hospital by his family members.  According to the patient's and patient is confused and getting worse over the past 3 days.  Chest x-ray with atelectasis urinalysis is pending flu test is pending .  Patient is resting comfortably during my examination but fever was 63 F Answering questions appropriately during my examination  PAST MEDICAL HISTORY:   Past Medical History:  Diagnosis Date  . CAD (coronary artery disease)   . HLD (hyperlipidemia)   . HTN (hypertension)   . LBBB (left bundle branch block)   . Osteoarthritis   . Type II diabetes mellitus ([place])     PAST SURGICAL HISTOIRY:   Past Surgical History:  Procedure Laterality Date  . CATARACT EXTRACTION    . CORONARY ARTERY BYPASS GRAFT  2012  . INGUINAL HERNIA REPAIR    . JOINT REPLACEMENT     knees    SOCIAL HISTORY:   Social History   Tobacco Use  . Smoking status: Former Smoker    Last attempt to quit: 03/03/2008    Years since quitting: 10.2  . Smokeless tobacco: Current User    Types: Chew  Substance Use Topics  . Alcohol use: No    Alcohol/week: 0.0 oz    FAMILY HISTORY:   Family History  Problem Relation Age of Onset  . Arthritis Mother   . Heart disease Mother   . Heart disease Other     DRUG ALLERGIES:  No Known Allergies  REVIEW OF  SYSTEMS:  CONSTITUTIONAL: reports fever and weakness  EYES: No blurred or double vision.  EARS, NOSE, AND THROAT: No tinnitus or ear pain.  RESPIRATORY: No cough, shortness of breath, wheezing or hemoptysis.  CARDIOVASCULAR: No chest pain, orthopnea, edema.  GASTROINTESTINAL: No nausea, vomiting, diarrhea or abdominal pain.  GENITOURINARY: No dysuria, hematuria.  ENDOCRINE: No polyuria, nocturia,  HEMATOLOGY: No anemia, easy bruising or bleeding SKIN: No rash or lesion. MUSCULOSKELETAL: No joint pain or arthritis.   NEUROLOGIC: No tingling, numbness, weakness.  PSYCHIATRY: No anxiety or depression.   MEDICATIONS AT HOME:   Prior to Admission medications   Medication Sig Start Date End Date Taking? Authorizing Provider  acetaminophen (TYLENOL) 325 MG tablet Take 2 tablets (650 mg total) by mouth every 6 (six) hours as needed for mild pain (or Fever >/= 101). 02/02/18  Yes Arrien, Jimmy Picket, MD  aspirin EC 81 MG tablet Take 81 mg by mouth daily.    Yes [provider]  Cyanocobalamin (RA VITAMIN B-12 TR) 1000 MCG TBCR Take 1,000 mcg by mouth daily.    Yes [provider]  Ferrous Sulfate (SLOW FE) 142 (45 Fe) MG TBCR Take 1 tablet by mouth daily.   Yes [provider]  iron polysaccharides (NIFEREX) 150 MG capsule Take 150 mg by mouth daily.   Yes [provider]  lisinopril (PRINIVIL,ZESTRIL) 30 MG  tablet Take 30 mg by mouth daily.    Yes [provider]  lovastatin (MEVACOR) 40 MG tablet Take 40 mg by mouth at bedtime.   Yes [provider]  metoprolol tartrate (LOPRESSOR) 25 MG tablet Take 12.5 mg by mouth 2 (two) times daily.   Yes [provider]  Multiple Vitamins-Minerals (CENTRUM SILVER) tablet Take 1 tablet by mouth daily. 02/11/18  Yes [provider]  pantoprazole (PROTONIX) 40 MG tablet Take 1 tablet (40 mg total) by mouth daily. 07/28/15  Yes Glendon Axe, MD      VITAL SIGNS:  Blood pressure (!)  165/75, pulse 81, temperature (!) 101.3 F (38.5 C), temperature source Oral, resp. rate (!) 24, height 5\' 11"  (1.803 m), weight 81.6 kg (180 lb), SpO2 99 %.  PHYSICAL EXAMINATION:  GENERAL:  84 y.o.-year-old patient lying in the bed with no acute distress.  EYES: Pupils equal, round, reactive to light and accommodation. No scleral icterus. Extraocular muscles intact.  HEENT: Head atraumatic, normocephalic. Oropharynx and nasopharynx clear.  NECK:  Supple, no jugular venous distention. No thyroid enlargement, no tenderness.  LUNGS: Normal breath sounds bilaterally, no wheezing, rales,rhonchi or crepitation. No use of accessory muscles of respiration.  CARDIOVASCULAR: S1, S2 normal. No murmurs, rubs, or gallops.  Midsternal scar is healing well ABDOMEN: Soft, nontender, nondistended. Bowel sounds present. No organomegaly or mass.  EXTREMITIES: No pedal edema, cyanosis, or clubbing.  NEUROLOGIC: Cranial nerves II through XII are intact. Muscle strength 5/5 in all extremities. Sensation intact. Gait not checked.  PSYCHIATRIC: The patient is alert and oriented x 3.  SKIN: No obvious rash, lesion, or ulcer.   LABORATORY PANEL:   CBC Recent Labs  Lab 05/30/18 1822  WBC 9.6  HGB 12.1*  HCT 36.1*  PLT 191   ------------------------------------------------------------------------------------------------------------------  Chemistries  Recent Labs  Lab 05/30/18 1822  NA 138  K 3.7  CL 100*  CO2 26  GLUCOSE 149*  BUN 18  CREATININE 1.23  CALCIUM 8.9  AST 25  ALT 12*  ALKPHOS 66  BILITOT 0.9   ------------------------------------------------------------------------------------------------------------------  Cardiac Enzymes Recent Labs  Lab 05/30/18 1822  TROPONINI 0.06*   ------------------------------------------------------------------------------------------------------------------  RADIOLOGY:  Dg Chest 2 View  Result Date: 05/30/2018 CLINICAL DATA:  Unable to  ambulate EXAM: CHEST - 2 VIEW COMPARISON:  02/01/2018, 05/25/2011 FINDINGS: Post sternotomy changes with multiple fractured sternal wires as before. No significant pleural effusion. Scarring or atelectasis at the lingula and left base. Stable cardiomediastinal silhouette with aortic atherosclerosis. No pneumothorax. IMPRESSION: Scarring or atelectasis at the lingula and left base. No definite acute interval change compared to prior radiograph. Electronically Signed   By: Donavan Foil M.D.   On: 05/30/2018 19:17   Ct Head Code Stroke Wo Contrast  Result Date: 05/30/2018 CLINICAL DATA:  Code stroke. RIGHT-sided weakness, unable to ambulate. History of hypertension, hyperlipidemia. EXAM: CT HEAD WITHOUT CONTRAST TECHNIQUE: Contiguous axial images were obtained from the base of the skull through the vertex without intravenous contrast. COMPARISON:  MRI of the head April 03, 2018 and CT HEAD February 01, 2018. FINDINGS: BRAIN: No intraparenchymal hemorrhage, mass effect nor midline shift. The ventricles and sulci are normal for age. Patchy supratentorial white matter hypodensities within normal range for patient's age, though non-specific are most compatible with chronic small vessel ischemic disease. No acute large vascular territory infarcts. No abnormal extra-axial fluid collections. Basal cisterns are patent. VASCULAR: Severe calcific atherosclerosis of the carotid siphons. SKULL: No skull fracture. No significant scalp soft tissue  swelling. SINUSES/ORBITS: The mastoid air-cells and included paranasal sinuses are well-aerated.The included ocular globes and orbital contents are non-suspicious. Status post bilateral ocular lens implants. OTHER: None. ASPECTS [facility] Stroke Program Early CT Score) - Ganglionic level infarction (caudate, lentiform nuclei, internal capsule, insula, M1-M3 cortex): 7 - Supraganglionic infarction (M4-M6 cortex): 3 Total score (0-10 with 10 being normal): 10 IMPRESSION: 1. No acute  intracranial process. 2. Stable examination with severe chronic small vessel ischemic changes and severe atherosclerosis. 3. ASPECTS is 10. 4. Critical Value/emergent results were called by telephone at the time of interpretation on 05/30/2018 at 6:20 pm to Dr. Clearnce Hasten, who verbally acknowledged these results. Electronically Signed   By: Elon Alas M.D.   On: 05/30/2018 18:21    EKG:   Orders placed or performed during the hospital encounter of 05/30/18  . EKG 12-Lead  . EKG 12-Lead  . ED EKG  . ED EKG  . EKG 12-Lead  . EKG 12-Lead    IMPRESSION AND PLAN:  heodore Felter  is a 84 y.o. male with a known history of coronary artery disease status post CABG, hyperlipidemia hypertension, diabetes mellitus not on any medications, left bundle branch block history is presenting to the ED with a chief complaint of weakness.  Patient is brought in to the hospital by his family members.  According to the patient's and patient is confused and getting worse over the past 3 days.  Chest x-ray with atelectasis urinalysis is pending flu test is pending    #Fever with delirium-unclear etiology at this time Admit to MedSurg unit Chest x-ray with atelectasis Urinalysis is pending Flu testing is pending   patient denies any diarrhea No leukocytosis and lactic acid is normal Patient patient is started on broad-spectrum IV antibiotics at this time   #Elevated troponin-could be from demand ischemia Initial troponin 0 0.06 but patient is asymptomatic #   #AKI Gentle hydration with IV fluids and avoid nephrotoxins monitor renal function Creatinine 0.78-1.21  #history of coronary artery disease status post CABG Continue home medication baby aspirin, metoprolol, lisinopril and statin Sees Dr. [name] as an outpatient  #Hyperlipidemia continue statin  #Diabetes mellitus sliding scale insulin at this time not on any home medications,   DVT prophylaxis with Lovenox subcu  All the records  are reviewed and case discussed with ED provider. Management plans discussed with the patient, family and they are in agreement.  CODE STATUS: FC   TOTAL TIME TAKING CARE OF THIS PATIENT: 43 minutes.   Note: This dictation was prepared with Dragon dictation along with smaller phrase technology. Any transcriptional errors that result from this process are unintentional.  Nicholes Mango M.D on 05/30/2018 at 8:00 PM  Between 7am to 6pm - Pager - [phone]  After 6pm go to [email or web address] - password [email or web address] [facility]  Office  [phone]  CC: Primary care physician; Teodoro Spray, MD

## 2018-05-30 NOTE — ED Provider Notes (Signed)
[facility] Emergency Department Provider Note ___________________________________________   First MD Initiated Contact with Patient 05/30/18 1923     (approximate)  I have reviewed the triage vital signs and the nursing notes.   HISTORY  Chief Complaint Weakness  HPI Michael Simon is a 84 y.o. male with a history of CAD as well as left bundle branch block and recent admission for sepsis this past April at [place] who is presenting to the emergency department today with weakness.  He is here with his son who says that the patient has been intermittently weak and confused over the past 3 days.  The patient acutely worsened today with generalized weakness.  He presented to the emergency department and does not have any complaints such as any shortness of breath, or pain.  Denies any cough or dysuria.  Past Medical History:  Diagnosis Date  . CAD (coronary artery disease)   . HLD (hyperlipidemia)   . HTN (hypertension)   . LBBB (left bundle branch block)   . Osteoarthritis   . Type II diabetes mellitus [facility])     Patient Active Problem List   Diagnosis Date Noted  . Fever 05/30/2018  . Weakness 02/02/2018  . Abscess of right earlobe 02/02/2018  . Generalized weakness 02/02/2018  . Hematochezia 07/26/2015  . Type 2 diabetes mellitus ([place]) 07/26/2015  . HTN (hypertension) 07/26/2015  . HLD (hyperlipidemia) 07/26/2015  . CAD (coronary artery disease) 07/26/2015    Past Surgical History:  Procedure Laterality Date  . CATARACT EXTRACTION    . CORONARY ARTERY BYPASS GRAFT  2012  . INGUINAL HERNIA REPAIR    . JOINT REPLACEMENT     knees    Prior to Admission medications   Medication Sig Start Date End Date Taking? Authorizing Provider  acetaminophen (TYLENOL) 325 MG tablet Take 2 tablets (650 mg total) by mouth every 6 (six) hours as needed for mild pain (or Fever >/= 101). 02/02/18  Yes Arrien, Jimmy Picket, MD  aspirin EC 81 MG tablet  Take 81 mg by mouth daily.    Yes [provider]  Cyanocobalamin (RA VITAMIN B-12 TR) 1000 MCG TBCR Take 1,000 mcg by mouth daily.    Yes [provider]  Ferrous Sulfate (SLOW FE) 142 (45 Fe) MG TBCR Take 1 tablet by mouth daily.   Yes [provider]  iron polysaccharides (NIFEREX) 150 MG capsule Take 150 mg by mouth daily.   Yes [provider]  lisinopril (PRINIVIL,ZESTRIL) 30 MG tablet Take 30 mg by mouth daily.    Yes [provider]  lovastatin (MEVACOR) 40 MG tablet Take 40 mg by mouth at bedtime.   Yes [provider]  metoprolol tartrate (LOPRESSOR) 25 MG tablet Take 12.5 mg by mouth 2 (two) times daily.   Yes [provider]  Multiple Vitamins-Minerals (CENTRUM SILVER) tablet Take 1 tablet by mouth daily. 02/11/18  Yes [provider]  pantoprazole (PROTONIX) 40 MG tablet Take 1 tablet (40 mg total) by mouth daily. 07/28/15  Yes Glendon Axe, MD    Allergies Patient has no known allergies.  Family History  Problem Relation Age of Onset  . Arthritis Mother   . Heart disease Mother   . Heart disease Other     Social History Social History   Tobacco Use  . Smoking status: Former Smoker    Last attempt to quit: 03/03/2008    Years since quitting: 10.2  . Smokeless tobacco: Current User  Types: Chew  Substance Use Topics  . Alcohol use: No    Alcohol/week: 0.0 oz  . Drug use: No    Review of Systems  Constitutional: No fever/chills Eyes: No visual changes. ENT: No sore throat. Cardiovascular: Denies chest pain. Respiratory: Denies shortness of breath. Gastrointestinal: No abdominal pain.  No nausea, no vomiting.  No diarrhea.  No constipation. Genitourinary: Negative for dysuria. Musculoskeletal: Negative for back pain. Skin: Negative for rash. Neurological: Negative for headaches, focal weakness or numbness.   ____________________________________________   PHYSICAL EXAM:  VITAL  SIGNS: ED Triage Vitals  Enc Vitals Group     BP 05/30/18 1758 (!) 153/69     Pulse Rate 05/30/18 1758 89     Resp 05/30/18 1758 14     Temp 05/30/18 1758 (!) 103 F (39.4 C)     Temp Source 05/30/18 1758 Oral     SpO2 05/30/18 1758 96 %     Weight 05/30/18 1820 180 lb (81.6 kg)     Height 05/30/18 1820 5\' 11"  (1.803 m)     Head Circumference --      Peak Flow --      Pain Score 05/30/18 1820 0     Pain Loc --      Pain Edu? --      Excl. in [place]? --     Constitutional: Alert and oriented. Well appearing and in no acute distress. Eyes: Conjunctivae are normal.  Head: Atraumatic. Nose: No congestion/rhinnorhea. Mouth/Throat: Mucous membranes are moist.  Neck: No stridor.  Moves head neck freely without meningismus. Cardiovascular: Normal rate, regular rhythm. Grossly normal heart sounds.   Respiratory: Normal respiratory effort.  No retractions.  Rales diffusely greater on the left than the right. Gastrointestinal: Soft and nontender. No distention. No CVA tenderness. Musculoskeletal: No lower extremity tenderness nor edema.  No joint effusions. Neurologic:  Normal speech and language. No gross focal neurologic deficits are appreciated. Skin:  Skin is warm, dry and intact. No rash noted. Psychiatric: Mood and affect are normal. Speech and behavior are normal.  ____________________________________________   LABS (all labs ordered are listed, but only abnormal results are displayed)  Labs Reviewed  GLUCOSE, CAPILLARY - Abnormal; Notable for the following components:      Result Value   Glucose-Capillary 147 (*)    All other components within normal limits  CBC - Abnormal; Notable for the following components:   RBC 3.94 (*)    Hemoglobin 12.1 (*)    HCT 36.1 (*)    RDW 14.6 (*)    All other components within normal limits  DIFFERENTIAL - Abnormal; Notable for the following components:   Neutro Abs 8.8 (*)    Lymphs Abs 0.3 (*)    All other components within normal  limits  COMPREHENSIVE METABOLIC PANEL - Abnormal; Notable for the following components:   Chloride 100 (*)    Glucose, Bld 149 (*)    ALT 12 (*)    GFR calc non Af Amer 53 (*)    All other components within normal limits  TROPONIN I - Abnormal; Notable for the following components:   Troponin I 0.06 (*)    All other components within normal limits  CULTURE, BLOOD (ROUTINE X 2)  CULTURE, BLOOD (ROUTINE X 2)  URINE CULTURE  PROTIME-INR  APTT  LACTIC ACID, PLASMA  URINALYSIS, ROUTINE W REFLEX MICROSCOPIC  LACTIC ACID, PLASMA  INFLUENZA PANEL BY PCR (TYPE A & B)  CBG MONITORING, ED   ____________________________________________  EKG  ED ECG REPORT I, Doran Stabler, the attending physician, personally viewed and interpreted this ECG.   Date: 05/30/2018  EKG Time: 1752  Rate: 89  Rhythm: normal sinus rhythm  Axis: Left axis  Intervals:left bundle branch block  ST&T Change: ST segment elevations consistent with left bundle branch block but without any signs of the sgarbossa critea.   ____________________________________________  RADIOLOGY  Scarring versus atelectasis in the lingula and left lung base.  No definite acute interval change compared to prior.  Head CT without acute finding. ____________________________________________   PROCEDURES  Procedure(s) performed:   Procedures  Critical Care performed:   ____________________________________________   INITIAL IMPRESSION / ASSESSMENT AND PLAN / ED COURSE  Pertinent labs & imaging results that were available during my care of the patient were reviewed by me and considered in my medical decision making (see chart for details).  Differential diagnosis includes, but is not limited to, alcohol, illicit or prescription medications, or other toxic ingestion; intracranial pathology such as stroke or intracerebral hemorrhage; fever or infectious causes including sepsis; hypoxemia and/or hypercarbia; uremia; trauma;  endocrine related disorders such as diabetes, hypoglycemia, and thyroid-related diseases; hypertensive encephalopathy; etc.  As part of my medical decision making, I reviewed the following data within the electronic MEDICAL RECORD NUMBER Notes from prior ED visits  ----------------------------------------- 7:25 PM on 05/30/2018 -----------------------------------------  Patient be admitted to the hospitalist service.  No focal finding at this point.  Possible pneumonia however no definitive interval change on the chest x-ray.  To be admitted to the hospital.  Signed out to Dr. Margaretmary Eddy.   ____________________________________________   FINAL CLINICAL IMPRESSION(S) / ED DIAGNOSES  Fever.  Delirium.     NEW MEDICATIONS STARTED DURING THIS VISIT:  New Prescriptions   No medications on file     Note:  This document was prepared using Dragon voice recognition software and may include unintentional dictation errors.     Orbie Pyo, MD 05/30/18 [phone]

## 2018-05-30 NOTE — ED Notes (Signed)
CODE STROKE/ CODE SEPSIS

## 2018-05-30 NOTE — Progress Notes (Signed)
Anticoagulation monitoring(Lovenox):  84 yo male ordered Lovenox 30 mg Q24h  Filed Weights   05/30/18 1820  Weight: 180 lb (81.6 kg)   BMI   Lab Results  Component Value Date   CREATININE 1.23 05/30/2018   CREATININE 1.14 02/02/2018   CREATININE 1.23 02/01/2018   Estimated Creatinine Clearance: 49.3 mL/min (by C-G formula based on SCr of 1.23 mg/dL). Hemoglobin & Hematocrit     Component Value Date/Time   HGB 12.1 (L) 05/30/2018 1822   HCT 36.1 (L) 05/30/2018 1822     Per Protocol for Patient with estCrcl > 30 ml/min and BMI > 40, will transition to Lovenox 40mg  Q24h.

## 2018-05-30 NOTE — Progress Notes (Signed)
Pharmacy Antibiotic Note  Michael Simon is a 84 y.o. male admitted on 05/30/2018 with pneumonia and sepsis.  Pharmacy has been consulted for vancomycin and Zosyn dosing.  Plan: Zosyn 3.375 g EI q 8 hours.   Vancomycin 1000 mg iv once followed by 1250 mg iv q 18 hours with stacked dosing. F/U MRSA PCR.   Height: 5\' 11"  (180.3 cm) Weight: 180 lb (81.6 kg) IBW/kg (Calculated) : 75.3  Temp (24hrs), Avg:101.7 F (38.7 C), Min:100.9 F (38.3 C), Max:103 F (39.4 C)  Recent Labs  Lab 05/30/18 1822 05/30/18 1841  WBC 9.6  --   CREATININE 1.23  --   LATICACIDVEN  --  1.0    Estimated Creatinine Clearance: 49.3 mL/min (by C-G formula based on SCr of 1.23 mg/dL).    No Known Allergies  Antimicrobials this admission: vancomycin 6/13 >>  Zosyn 6/13 >>   Dose adjustments this admission:   Microbiology results: 6/13 BCx: sent 6/13 UCx: sent  6/13 Sputum: sent  6/13 MRSA PCR: pending  Thank you for allowing pharmacy to be a part of this patient's care.  Napoleon Form 05/30/2018 8:49 PM

## 2018-05-30 NOTE — ED Notes (Signed)
Patient transported to X-ray 

## 2018-05-30 NOTE — ED Triage Notes (Signed)
Pt arrived via POV from home d/t suddenly being unable to ambulate. Pt was seen earlier today at his PCP earlier today and was ambulating normal. Pt denies any pain at this time and is A&O x4.

## 2018-05-30 NOTE — Progress Notes (Signed)
   05/30/18 1802  Clinical Encounter Type  Visited With Patient and family together  Visit Type Code (Stroke)  Referral From Nurse  Consult/Referral To Chaplain  Spiritual Encounters  Spiritual Needs Emotional    CH responded to CD Stroke. Pt's son was present, and I checked in with him and offered emotional support and pastoral presence as nurses and doctors assessed his father's condition.

## 2018-05-30 NOTE — Progress Notes (Signed)
CODE SEPSIS - PHARMACY COMMUNICATION  **Broad Spectrum Antibiotics should be administered within 1 hour of Sepsis diagnosis**  Time Code Sepsis Called/Page Received: 1844  Antibiotics Ordered: vancomycin and Zosyn  Time of 1st antibiotic administration: 1848  Additional action taken by pharmacy:   If necessary, Name of Provider/Nurse Contacted:     Napoleon Form ,PharmD Clinical Pharmacist  05/30/2018  7:10 PM

## 2018-05-30 NOTE — Progress Notes (Signed)
Family Meeting Note  Advance Directive:yes  Today a meeting took place with the Patient.   The following clinical team members were present during this meeting:MD  The following were discussed:Patient's diagnosis: Generalized weakness,  high-grade fever, acute kidney injury, elevated troponin and other comorbidities as documented below and treatment plan of care discussed with the patient.    CAD (coronary artery disease)    . HLD (hyperlipidemia)   . HTN (hypertension)   . LBBB (left bundle branch block)   . Osteoarthritis   . Type II diabetes mellitus ([place])        Patient's progosis: Unable to determine and Goals for treatment: Full Code  Son Audry Pili is the healthcare power of attorney  Additional follow-up to be provided: Hospitalist  Time spent during discussion:18 MIN  Nicholes Mango, MD

## 2018-05-31 LAB — URINALYSIS, COMPLETE (UACMP) WITH MICROSCOPIC
BILIRUBIN URINE: NEGATIVE
Bacteria, UA: NONE SEEN
GLUCOSE, UA: 50 mg/dL — AB
KETONES UR: NEGATIVE mg/dL
Leukocytes, UA: NEGATIVE
NITRITE: NEGATIVE
PROTEIN: 30 mg/dL — AB
Specific Gravity, Urine: 1.018 (ref 1.005–1.030)
pH: 5 (ref 5.0–8.0)

## 2018-05-31 LAB — MRSA PCR SCREENING: MRSA BY PCR: POSITIVE — AB

## 2018-05-31 LAB — CBC
HEMATOCRIT: 32.1 % — AB (ref 40.0–52.0)
Hemoglobin: 10.6 g/dL — ABNORMAL LOW (ref 13.0–18.0)
MCH: 30.4 pg (ref 26.0–34.0)
MCHC: 33.2 g/dL (ref 32.0–36.0)
MCV: 91.5 fL (ref 80.0–100.0)
PLATELETS: 158 10*3/uL (ref 150–440)
RBC: 3.51 MIL/uL — AB (ref 4.40–5.90)
RDW: 14.7 % — ABNORMAL HIGH (ref 11.5–14.5)
WBC: 5.9 10*3/uL (ref 3.8–10.6)

## 2018-05-31 LAB — TROPONIN I
Troponin I: 0.06 ng/mL (ref <0.03)
Troponin I: 0.04 ng/mL (ref <0.03)

## 2018-05-31 LAB — COMPREHENSIVE METABOLIC PANEL
ALT: 16 U/L — ABNORMAL LOW (ref 17–63)
AST: 31 U/L (ref 15–41)
Albumin: 3.4 g/dL — ABNORMAL LOW (ref 3.5–5.0)
Alkaline Phosphatase: 51 U/L (ref 38–126)
Anion gap: 7 (ref 5–15)
BILIRUBIN TOTAL: 0.8 mg/dL (ref 0.3–1.2)
BUN: 15 mg/dL (ref 6–20)
CHLORIDE: 105 mmol/L (ref 101–111)
CO2: 24 mmol/L (ref 22–32)
Calcium: 7.8 mg/dL — ABNORMAL LOW (ref 8.9–10.3)
Creatinine, Ser: 1.15 mg/dL (ref 0.61–1.24)
GFR calc Af Amer: >60 mL/min (ref >60)
GFR, EST NON AFRICAN AMERICAN: 57 mL/min — AB (ref >60)
Glucose, Bld: 107 mg/dL — ABNORMAL HIGH (ref 65–99)
Potassium: 3.7 mmol/L (ref 3.5–5.1)
Sodium: 136 mmol/L (ref 135–145)
Total Protein: 6.1 g/dL — ABNORMAL LOW (ref 6.5–8.1)

## 2018-05-31 LAB — HEMOGLOBIN A1C
Hgb A1c MFr Bld: 6.5 % — ABNORMAL HIGH (ref 4.8–5.6)
Mean Plasma Glucose: 139.85 mg/dL

## 2018-05-31 LAB — GLUCOSE, CAPILLARY
Glucose-Capillary: 99 mg/dL (ref 65–99)
Glucose-Capillary: 112 mg/dL — ABNORMAL HIGH (ref 65–99)
Glucose-Capillary: 88 mg/dL (ref 65–99)
Glucose-Capillary: 114 mg/dL — ABNORMAL HIGH (ref 65–99)

## 2018-05-31 LAB — PROCALCITONIN: PROCALCITONIN: 0.5 ng/mL

## 2018-05-31 MED ORDER — MUPIROCIN 2 % EX OINT
1.0000 "application " | TOPICAL_OINTMENT | Freq: Two times a day (BID) | CUTANEOUS | Status: DC
  Administered 2018-05-31 – 2018-06-02 (×4): 1 via NASAL
  Filled 2018-05-31 (×2): qty 22

## 2018-05-31 MED ORDER — CHLORHEXIDINE GLUCONATE CLOTH 2 % EX PADS
6.0000 | MEDICATED_PAD | Freq: Every day | CUTANEOUS | Status: DC
  Administered 2018-05-31 – 2018-06-02 (×3): 6 via TOPICAL

## 2018-05-31 NOTE — Progress Notes (Signed)
[place] [facility] [place] NAME: Tehran Rabenold    MR#:  [id number]  DATE OF BIRTH:  03-03-1933  SUBJECTIVE:   Patient here due to weakness, fever and sepsis but source unclear.  Patient continues to be significantly weak and lethargic today.  No other acute events overnight.  REVIEW OF SYSTEMS:    Review of Systems  Constitutional: Negative for chills and fever.  HENT: Negative for congestion and tinnitus.   Eyes: Negative for blurred vision and double vision.  Respiratory: Negative for cough, shortness of breath and wheezing.   Cardiovascular: Negative for chest pain, orthopnea and PND.  Gastrointestinal: Negative for abdominal pain, diarrhea, nausea and vomiting.  Genitourinary: Negative for dysuria and hematuria.  Neurological: Positive for weakness (Generalized). Negative for dizziness, sensory change and focal weakness.  All other systems reviewed and are negative.   Nutrition: Heart Healthy/Carb control Tolerating Diet: Yes Tolerating PT: Eval noted.   DRUG ALLERGIES:  No Known Allergies  VITALS:  Blood pressure (!) 145/65, pulse 70, temperature 100 F (37.8 C), temperature source Oral, resp. rate 15, height '5\' 8"'$  (1.727 m), weight 72.7 kg (160 lb 3.2 oz), SpO2 96 %.  PHYSICAL EXAMINATION:   Physical Exam  GENERAL:  84 y.o.-year-old patient lying in bed in NAD.   EYES: Pupils equal, round, reactive to light and accommodation. No scleral icterus. Extraocular muscles intact.  HEENT: Head atraumatic, normocephalic. Oropharynx and nasopharynx clear.  NECK:  Supple, no jugular venous distention. No thyroid enlargement, no tenderness.  LUNGS: Normal breath sounds bilaterally, no wheezing, rales, rhonchi. No use of accessory muscles of respiration.  CARDIOVASCULAR: S1, S2 normal. No murmurs, rubs, or gallops.  ABDOMEN: Soft, nontender, nondistended. Bowel sounds present. No organomegaly or mass.  EXTREMITIES: No cyanosis, clubbing  or edema b/l.    NEUROLOGIC: Cranial nerves II through XII are intact. No focal Motor or sensory deficits b/l. Globally weak.    PSYCHIATRIC: The patient is alert and oriented x 2.  SKIN: No obvious rash, lesion, or ulcer.    LABORATORY PANEL:   CBC Recent Labs  Lab 05/31/18 0905  WBC 5.9  HGB 10.6*  HCT 32.1*  PLT 158   ------------------------------------------------------------------------------------------------------------------  Chemistries  Recent Labs  Lab 05/31/18 0905  NA 136  K 3.7  CL 105  CO2 24  GLUCOSE 107*  BUN 15  CREATININE 1.15  CALCIUM 7.8*  AST 31  ALT 16*  ALKPHOS 51  BILITOT 0.8   ------------------------------------------------------------------------------------------------------------------  Cardiac Enzymes Recent Labs  Lab 05/31/18 0905  TROPONINI 0.04*   ------------------------------------------------------------------------------------------------------------------  RADIOLOGY:  Dg Chest 2 View  Result Date: 05/30/2018 CLINICAL DATA:  Unable to ambulate EXAM: CHEST - 2 VIEW COMPARISON:  02/01/2018, 05/25/2011 FINDINGS: Post sternotomy changes with multiple fractured sternal wires as before. No significant pleural effusion. Scarring or atelectasis at the lingula and left base. Stable cardiomediastinal silhouette with aortic atherosclerosis. No pneumothorax. IMPRESSION: Scarring or atelectasis at the lingula and left base. No definite acute interval change compared to prior radiograph. Electronically Signed   By: Donavan Foil M.D.   On: 05/30/2018 19:17   Ct Head Code Stroke Wo Contrast  Result Date: 05/30/2018 CLINICAL DATA:  Code stroke. RIGHT-sided weakness, unable to ambulate. History of hypertension, hyperlipidemia. EXAM: CT HEAD WITHOUT CONTRAST TECHNIQUE: Contiguous axial images were obtained from the base of the skull through the vertex without intravenous contrast. COMPARISON:  MRI of the head April 03, 2018 and CT HEAD  February 01, 2018. FINDINGS:  BRAIN: No intraparenchymal hemorrhage, mass effect nor midline shift. The ventricles and sulci are normal for age. Patchy supratentorial white matter hypodensities within normal range for patient's age, though non-specific are most compatible with chronic small vessel ischemic disease. No acute large vascular territory infarcts. No abnormal extra-axial fluid collections. Basal cisterns are patent. VASCULAR: Severe calcific atherosclerosis of the carotid siphons. SKULL: No skull fracture. No significant scalp soft tissue swelling. SINUSES/ORBITS: The mastoid air-cells and included paranasal sinuses are well-aerated.The included ocular globes and orbital contents are non-suspicious. Status post bilateral ocular lens implants. OTHER: None. ASPECTS [facility] Stroke Program Early CT Score) - Ganglionic level infarction (caudate, lentiform nuclei, internal capsule, insula, M1-M3 cortex): 7 - Supraganglionic infarction (M4-M6 cortex): 3 Total score (0-10 with 10 being normal): 10 IMPRESSION: 1. No acute intracranial process. 2. Stable examination with severe chronic small vessel ischemic changes and severe atherosclerosis. 3. ASPECTS is 10. 4. Critical Value/emergent results were called by telephone at the time of interpretation on 05/30/2018 at 6:20 pm to Dr. Clearnce Hasten, who verbally acknowledged these results. Electronically Signed   By: Elon Alas M.D.   On: 05/30/2018 18:21     ASSESSMENT AND PLAN:   84 year old male with past medical history of diabetes, osteoarthritis, hypertension, hyperlipidemia, history of coronary artery disease who presents to the hospital due to fever, chills and generalized weakness  1.  Sepsis-patient met criteria given his fever, tachycardia. - No source of sepsis has been identified.  Patient's chest x-ray negative for acute pathology, urinalysis showing no overt UTI. -Patient's flu test was negative, will continue broad-spectrum IV antibiotics  with vancomycin, Zosyn follow cultures for now. -Continue fluids and supportive care.  2.  Elevated troponin-likely secondary to supply demand ischemia.  Patient's troponins have remained flat.  3.  Acute kidney injury-secondary to volume loss/fever. -Improved with IV fluid hydration.  Creatinine is at baseline now.  4.  Generalized weakness/lethargy-etiology unclear but suspected to be secondary to underlying sepsis.  CT head was negative for acute pathology on admission.  Patient's weakness is although quite profound and he is leaning to one side, I will get an MRI of the brain.  5.  Essential hypertension-continue lisinopril, metoprolol.  6.  GERD-continue Protonix.  7.  Lipidemia-continue Pravachol.    All the records are reviewed and case discussed with Care Management/Social Worker. Management plans discussed with the patient, family and they are in agreement.  CODE STATUS: Full code  DVT Prophylaxis: Lovenox  TOTAL TIME TAKING CARE OF THIS PATIENT: 30 minutes.   POSSIBLE D/C IN 1-2 DAYS, DEPENDING ON CLINICAL CONDITION.   Henreitta Leber M.D on 05/31/2018 at 2:50 PM  Between 7am to 6pm - Pager - [phone]  After 6pm go to [email or web address] - Proofreader  [facility]  Office  [phone]  CC: Primary care physician; Teodoro Spray, MD

## 2018-05-31 NOTE — NC FL2 (Signed)
[place] LEVEL OF CARE SCREENING TOOL     IDENTIFICATION  Patient Name: Michael Simon Birthdate: Aug 17, 1933 Sex: male Admission Date (Current Location): 05/30/2018  [place] and [place] Number:  Engineering geologist and Address:  [facility], [address] [place], [place] [address]      Provider Number: [id number]  Attending Physician Name and Address:  Henreitta Leber, MD  Relative Name and Phone Number:       Current Level of Care: Hospital Recommended Level of Care: [place] Prior Approval Number:    Date Approved/Denied:   PASRR Number: [id number]  Discharge Plan: SNF    Current Diagnoses: Patient Active Problem List   Diagnosis Date Noted  . Fever 05/30/2018  . Weakness 02/02/2018  . Abscess of right earlobe 02/02/2018  . Generalized weakness 02/02/2018  . Hematochezia 07/26/2015  . Type 2 diabetes mellitus ([place]) 07/26/2015  . HTN (hypertension) 07/26/2015  . HLD (hyperlipidemia) 07/26/2015  . CAD (coronary artery disease) 07/26/2015    Orientation RESPIRATION BLADDER Height & Weight     Self  Normal Incontinent Weight: 160 lb 3.2 oz (72.7 kg) Height:  5\' 8"  (172.7 cm)  BEHAVIORAL SYMPTOMS/MOOD NEUROLOGICAL BOWEL NUTRITION STATUS  (none) (none) Continent Diet(Heart Healthy )  AMBULATORY STATUS COMMUNICATION OF NEEDS Skin   Extensive Assist Verbally Normal                       Personal Care Assistance Level of Assistance  Bathing, Feeding, Dressing Bathing Assistance: Limited assistance Feeding assistance: Independent Dressing Assistance: Limited assistance     Functional Limitations Info  Sight, Hearing, Speech Sight Info: Adequate Hearing Info: Adequate Speech Info: Adequate    SPECIAL CARE FACTORS FREQUENCY  PT (By licensed PT), OT (By licensed OT)                    Contractures Contractures Info: Not present    Additional Factors Info  Code Status, Allergies  Code Status Info: Full Code  Allergies Info: NKA           Current Medications (05/31/2018):  This is the current hospital active medication list Current Facility-Administered Medications  Medication Dose Route Frequency Provider Last Rate Last Dose  . 0.9 %  sodium chloride infusion   Intravenous Continuous Gouru, Aruna, MD 75 mL/hr at 05/31/18 1352    . acetaminophen (TYLENOL) tablet 650 mg  650 mg Oral Q6H PRN Gouru, Aruna, MD       Or  . acetaminophen (TYLENOL) suppository 650 mg  650 mg Rectal Q6H PRN Gouru, Aruna, MD      . aspirin EC tablet 81 mg  81 mg Oral Daily Gouru, Aruna, MD      . Chlorhexidine Gluconate Cloth 2 % PADS 6 each  6 each Topical Q0600 Henreitta Leber, MD   6 each at 05/31/18 1530  . docusate sodium (COLACE) capsule 100 mg  100 mg Oral BID Gouru, Aruna, MD   100 mg at 05/30/18 2257  . enoxaparin (LOVENOX) injection 40 mg  40 mg Subcutaneous Q24H Gouru, Aruna, MD   40 mg at 05/30/18 2258  . ferrous sulfate tablet 325 mg  325 mg Oral Daily Gouru, Aruna, MD      . insulin aspart (novoLOG) injection 0-5 Units  0-5 Units Subcutaneous QHS Gouru, Aruna, MD      . insulin aspart (novoLOG) injection 0-9 Units  0-9 Units Subcutaneous TID WC Gouru, Aruna,  MD      . iron polysaccharides (NIFEREX) capsule 150 mg  150 mg Oral Daily Gouru, Aruna, MD      . lisinopril (PRINIVIL,ZESTRIL) tablet 30 mg  30 mg Oral Daily Gouru, Aruna, MD      . metoprolol tartrate (LOPRESSOR) tablet 12.5 mg  12.5 mg Oral BID Gouru, Aruna, MD   12.5 mg at 05/30/18 2257  . multivitamin with minerals tablet 1 tablet  1 tablet Oral Daily Gouru, Aruna, MD      . mupirocin ointment (BACTROBAN) 2 % 1 application  1 application Nasal BID Henreitta Leber, MD      . ondansetron (ZOFRAN) tablet 4 mg  4 mg Oral Q6H PRN Gouru, Aruna, MD       Or  . ondansetron (ZOFRAN) injection 4 mg  4 mg Intravenous Q6H PRN Gouru, Aruna, MD      . pantoprazole (PROTONIX) EC tablet 40 mg  40 mg Oral Daily Gouru, Aruna, MD       . piperacillin-tazobactam (ZOSYN) IVPB 3.375 g  3.375 g Intravenous Q8H Gouru, Aruna, MD 12.5 mL/hr at 05/31/18 1054 3.375 g at 05/31/18 1054  . pravastatin (PRAVACHOL) tablet 40 mg  40 mg Oral q1800 Gouru, Aruna, MD      . vancomycin (VANCOCIN) 1,250 mg in sodium chloride 0.9 % 250 mL IVPB  1,250 mg Intravenous Q18H Nicholes Mango, MD   Stopped at 05/31/18 0516  . vitamin B-12 (CYANOCOBALAMIN) tablet 1,000 mcg  1,000 mcg Oral Daily Gouru, Aruna, MD         Discharge Medications: Please see discharge summary for a list of discharge medications.  Relevant Imaging Results:  Relevant Lab Results:   Additional Information SSN [id number]  Annamaria Boots, [place]

## 2018-05-31 NOTE — Progress Notes (Signed)
In and out cath. Unsuccessful, Pt tensing muscle and reported he was unable to cough. Upon removing catheter. Pt relieved bladder. Applied condom cath. No urine produced to collect specimen at this point

## 2018-05-31 NOTE — Evaluation (Signed)
Physical Therapy Evaluation Patient Details Name: Michael Simon MRN: [id number] DOB: May 06, 1933 Today's Date: 05/31/2018   History of Present Illness  Ezri Landers is an 84yo white male who comes to [facility] after sudden onset severe weakness/AMS at home. Pt found to be septic with PNA. PMH: DM2, LBBB, HTN, CAD, bil TKA, and CAD s/p CABG.   Clinical Impression  Pt admitted with above diagnosis. Pt currently with functional limitations due to the deficits listed below (see "PT Problem List"). Upon entry, pt in bed, Son and additional family present. The pt is awake and agreeable to participate, reports to feel good but clearly has poor awareness of current limitations. The pt is alert and oriented to self, still mildly confused, pleasant, conversational, and following simple commands 25-50% of time, limited some by gross weakness and/or HOH. Pt requires Total assist for bed mobility and transfers, too weak to attempt AMB due to inability to stand. Functional mobility assessment demonstrates increased effort/time requirements, poor tolerance, and need for physical assistance, whereas the patient performed these at a higher level of independence PTA. Pt lacks trunk endurance for independent posture >3 minutes, returning to a slumped position. Empirically, the patient demonstrates increased risk of recurrent falls AEB gait speed <0.7m/s, forward reach <5", and multiple LOB demonstrated throughout session. Pt will benefit from skilled PT intervention to increase independence and safety with basic mobility in preparation for discharge to the venue listed below.       Follow Up Recommendations SNF;Supervision/Assistance - 24 hour(Will be appropriate for DC to home once pt has improved strength and ability to mobliize. )    Equipment Recommendations  (to be determined)    Recommendations for Other Services       Precautions / Restrictions Precautions Precautions: Fall Restrictions Weight Bearing  Restrictions: No      Mobility  Bed Mobility Overal bed mobility: Needs Assistance Bed Mobility: Supine to Sit     Supine to sit: Total assist     General bed mobility comments: puts forth good effort, but unable to move legs or flex trunk without maxA  Transfers Overall transfer level: Needs assistance Equipment used: 1 person hand held assist Transfers: Sit to/from [employer] Transfers Sit to Stand: Max assist Stand pivot transfers: Total assist       General transfer comment: totalA for weight shifting, heavy VC for leg movement with limited capacity to perform.   Ambulation/Gait Ambulation/Gait assistance: (jno appropriate at this time, patient unable. )              Stairs            Wheelchair Mobility    Modified Rankin (Stroke Patients Only)       Balance Overall balance assessment: Needs assistance Sitting-balance support: Feet supported;Single extremity supported Sitting balance-Leahy Scale: Poor Sitting balance - Comments: tolerates sitting tall independently at EOB x2 minutes prior to truncal collapse.      Standing balance-Leahy Scale: Zero                               Pertinent Vitals/Pain Pain Assessment: No/denies pain    Home Living Family/patient expects to be discharged to:: Private residence Living Arrangements: Spouse/significant other;Children(wife and daughter, dtr works evenings. ) Available Help at Discharge: Family Type of Home: House Home Access: Stairs to enter Entrance Stairs-Rails: Right(and a grab bar) Entrance Stairs-Number of Steps: 3 Home Layout: One level Home Equipment: Grab bars - toilet;Grab  bars - tub/shower;Toilet riser;Tub bench;Shower seat;Walker - 2 wheels;Cane - single point      Prior Function Level of Independence: Independent         Comments: ADLs and light IADLs. Pt's daughters performing cooking and cleaning. Driving PTA     Hand Dominance   Dominant Hand:  Right    Extremity/Trunk Assessment   Upper Extremity Assessment Upper Extremity Assessment: Generalized weakness    Lower Extremity Assessment Lower Extremity Assessment: Generalized weakness    Cervical / Trunk Assessment Cervical / Trunk Assessment: (very weak, limited endurance. )  Communication   Communication: No difficulties  Cognition Arousal/Alertness: Awake/alert Behavior During Therapy: WFL for tasks assessed/performed Overall Cognitive Status: Within Functional Limits for tasks assessed                                        General Comments      Exercises     Assessment/Plan    PT Assessment Patient needs continued PT services  PT Problem List Decreased strength;Decreased activity tolerance;Decreased mobility       PT Treatment Interventions Functional mobility training;Therapeutic activities;Therapeutic exercise;Stair training;Gait training;Balance training;Patient/family education    PT Goals (Current goals can be found in the Care Plan section)  Acute Rehab PT Goals Patient Stated Goal: regain strength  PT Goal Formulation: With patient Time For Goal Achievement: 06/14/18 Potential to Achieve Goals: Good    Frequency Min 2X/week   Barriers to discharge Decreased caregiver support;Inaccessible home environment      Co-evaluation               AM-PAC PT "6 Clicks" Daily Activity  Outcome Measure Difficulty turning over in bed (including adjusting bedclothes, sheets and blankets)?: Unable Difficulty moving from lying on back to sitting on the side of the bed? : Unable Difficulty sitting down on and standing up from a chair with arms (e.g., wheelchair, bedside commode, etc,.)?: Unable Help needed moving to and from a bed to chair (including a wheelchair)?: Total Help needed walking in hospital room?: Total Help needed climbing 3-5 steps with a railing? : Total 6 Click Score: 6    End of Session   Activity Tolerance:  Patient tolerated treatment well;Patient limited by fatigue Patient left: in chair;with family/visitor present;with call bell/phone within reach   PT Visit Diagnosis: Unsteadiness on feet (R26.81);Other abnormalities of gait and mobility (R26.89);Difficulty in walking, not elsewhere classified (R26.2);Muscle weakness (generalized) (M62.81)    Time: 8676-1950 PT Time Calculation (min) (ACUTE ONLY): 27 min   Charges:   PT Evaluation $PT Eval Low Complexity: 1 Low PT Treatments $Therapeutic Activity: 8-22 mins   PT G Codes:        12:53 PM, 06-05-18 Etta Grandchild, PT, DPT Physical Therapist - [facility]  [phone] ([place])     Odies Desa C 05-Jun-2018, 12:50 PM

## 2018-06-01 ENCOUNTER — Inpatient Hospital Stay: Payer: Medicare HMO

## 2018-06-01 LAB — GLUCOSE, CAPILLARY
GLUCOSE-CAPILLARY: 100 mg/dL — AB (ref 65–99)
GLUCOSE-CAPILLARY: 132 mg/dL — AB (ref 65–99)
Glucose-Capillary: 205 mg/dL — ABNORMAL HIGH (ref 65–99)
Glucose-Capillary: 205 mg/dL — ABNORMAL HIGH (ref 65–99)

## 2018-06-01 LAB — PROCALCITONIN: PROCALCITONIN: 0.29 ng/mL

## 2018-06-01 NOTE — Evaluation (Signed)
Clinical/Bedside Swallow Evaluation Patient Details  Name: Euclide Granito MRN: [id number] Date of Birth: [birth year]  Today's Date: 06/01/2018 Time: SLP Start Time (ACUTE ONLY): 9563 SLP Stop Time (ACUTE ONLY): 0851 SLP Time Calculation (min) (ACUTE ONLY): 28 min  Past Medical History:  Past Medical History:  Diagnosis Date  . CAD (coronary artery disease)   . HLD (hyperlipidemia)   . HTN (hypertension)   . LBBB (left bundle branch block)   . Osteoarthritis   . Type II diabetes mellitus ([place])    Past Surgical History:  Past Surgical History:  Procedure Laterality Date  . CATARACT EXTRACTION    . CORONARY ARTERY BYPASS GRAFT  2012  . INGUINAL HERNIA REPAIR    . JOINT REPLACEMENT     knees   HPI:      Assessment / Plan / Recommendation Clinical Impression  pt presents with a minimal risk of aspiration and minimal oral phase dysphagia as characterized by poor dental condition and increased time for mastication. pt also presented with slowed a-p transit time and multiple swallows to clear oral cavity. pt had no overt ssx of aspiration with thin liquids or solids, however NSG reports he may have had difficulty swallowing pills. SLP educated to give pills crushed in puree. pt states he has no difficulty with swallowing pills, solids or liquids.   SLP recommends to remain on current diet as no overt ssx exist but recommende3d pt continue to take time for po intake. SLP will follow up to ensure diet safety.  SLP Visit Diagnosis: Dysphagia, oral phase (R13.11)    Aspiration Risk  Mild aspiration risk    Diet Recommendation Regular;Thin liquid   Liquid Administration via: Cup Medication Administration: Crushed with puree Supervision: Patient able to self feed Compensations: Slow rate;Small sips/bites;Follow solids with liquid Postural Changes: Seated upright at 90 degrees    Other  Recommendations Oral Care Recommendations: Patient independent with oral care   Follow up  Recommendations        Frequency and Duration min 2x/week  2 weeks       Prognosis Prognosis for Safe Diet Advancement: Good      Swallow Study   General Date of Onset: 06/01/18 Type of Study: Bedside Swallow Evaluation Diet Prior to this Study: Regular;Thin liquids Temperature Spikes Noted: No Respiratory Status: Room air History of Recent Intubation: No Behavior/Cognition: Alert;Cooperative;Pleasant mood Oral Cavity Assessment: Within Functional Limits Oral Care Completed by SLP: No Oral Cavity - Dentition: Missing dentition Vision: Functional for self-feeding Self-Feeding Abilities: Able to feed self Patient Positioning: Upright in bed Baseline Vocal Quality: Normal Volitional Cough: Strong Volitional Swallow: Able to elicit    Oral/Motor/Sensory Function Overall Oral Motor/Sensory Function: Within functional limits   Ice Chips Ice chips: Within functional limits Presentation: Spoon   Thin Liquid Thin Liquid: Within functional limits Presentation: Cup    Nectar Thick Nectar Thick Liquid: Not tested   Honey Thick Honey Thick Liquid: Not tested   Puree Puree: Within functional limits Presentation: Self Fed;Spoon   Solid   GO   Solid: Within functional limits Presentation: Self Fed;Spoon        [place] Fae Pippin 06/01/2018,9:21 AM

## 2018-06-01 NOTE — Progress Notes (Signed)
Physical Therapy Treatment Patient Details Name: Michael Simon MRN: [id number] DOB: 11/01/1933 Today's Date: 06/01/2018    History of Present Illness Michael Simon is an 84yo white male who comes to [facility] after sudden onset severe weakness/AMS at home. Pt found to be septic with PNA. PMH: DM2, LBBB, HTN, CAD, bil TKA, and CAD s/p CABG.     PT Comments    Pt in bed, feeling better.  To edge of bed with rail and increased time but no physical assist.  Once sitting, was able to remain upright without assist.  He stood with walker and min guard and was able to ambulate 2 laps in room with walker and min guard.  After donning isolation gown, he was able to continue around nursing unit x 1.  He did report some breathlessness and fatigue towards end of gait but overall did well with significant improvements from yesterdays session.  He did demonstrate some decreased safety awareness with walker needing verbal cues to correct and at one time during gait lifted his left leg high in the air to playfully kick his son as he was passing by.    With such significant improvement in mobility skills, pt may no longer qualify for SNF services.  HHPT with +1 assist for mobility for general safety is recommended at this time.  Pt has RW at home per pt and son.   Follow Up Recommendations  Home health PT;Supervision for mobility/OOB     Equipment Recommendations       Recommendations for Other Services       Precautions / Restrictions Precautions Precautions: Fall Restrictions Weight Bearing Restrictions: No    Mobility  Bed Mobility Overal bed mobility: Needs Assistance Bed Mobility: Supine to Sit     Supine to sit: Supervision     General bed mobility comments: struggles but able to do wiht rails only today and increased time  Transfers Overall transfer level: Needs assistance Equipment used: Rolling walker (2 wheeled) Transfers: Sit to/from Stand Sit to Stand: Min guard            Ambulation/Gait Ambulation/Gait assistance: Min guard;Min assist Gait Distance (Feet): 250 Feet Assistive device: Rolling walker (2 wheeled) Gait Pattern/deviations: Step-through pattern;Staggering right Gait velocity: decreased   General Gait Details: significant improvement.  poor walker position at times and decreased safety deficits noted but overall does well.   Stairs             Wheelchair Mobility    Modified Rankin (Stroke Patients Only)       Balance Overall balance assessment: Needs assistance Sitting-balance support: Feet supported;Single extremity supported Sitting balance-Leahy Scale: Good     Standing balance support: Bilateral upper extremity supported Standing balance-Leahy Scale: Fair                              Cognition Arousal/Alertness: Awake/alert Behavior During Therapy: WFL for tasks assessed/performed Overall Cognitive Status: Within Functional Limits for tasks assessed                                        Exercises Other Exercises Other Exercises: BLE AROM ankle pumps, SLR, Heel slides, ab/add x 10     General Comments        Pertinent Vitals/Pain Pain Assessment: No/denies pain    Home Living  Prior Function            PT Goals (current goals can now be found in the care plan section) Progress towards PT goals: Progressing toward goals    Frequency    Min 2X/week      PT Plan Current plan remains appropriate    Co-evaluation              AM-PAC PT "6 Clicks" Daily Activity  Outcome Measure  Difficulty turning over in bed (including adjusting bedclothes, sheets and blankets)?: None Difficulty moving from lying on back to sitting on the side of the bed? : None Difficulty sitting down on and standing up from a chair with arms (e.g., wheelchair, bedside commode, etc,.)?: None Help needed moving to and from a bed to chair (including a  wheelchair)?: A Little Help needed walking in hospital room?: A Little Help needed climbing 3-5 steps with a railing? : A Little 6 Click Score: 21    End of Session Equipment Utilized During Treatment: Gait belt Activity Tolerance: Patient tolerated treatment well Patient left: in chair;with chair alarm set;with call bell/phone within reach;with family/visitor present Nurse Communication: Mobility status       Time: 1000-1028 PT Time Calculation (min) (ACUTE ONLY): 28 min  Charges:  $Gait Training: 8-22 mins $Therapeutic Exercise: 8-22 mins                    G Codes:       Chesley Noon, PTA 06/01/18, 12:06 PM

## 2018-06-01 NOTE — Clinical Social Work Note (Signed)
PT is now recommending Home Health. CSW is signing off. Please consult should needs escalate.  [name], MSW, [name] [phone]

## 2018-06-01 NOTE — Progress Notes (Signed)
[facility] [place] NAME: Michael Simon    MR#:  [id number]  DATE OF BIRTH:  [birth year]  SUBJECTIVE:   Patient here due to weakness, fever and suspected sepsis but source unclear.  Afebrile overnight.  Weakness has improved.  REVIEW OF SYSTEMS:    Review of Systems  Constitutional: Negative for chills and fever.  HENT: Negative for congestion and tinnitus.   Eyes: Negative for blurred vision and double vision.  Respiratory: Negative for cough, shortness of breath and wheezing.   Cardiovascular: Negative for chest pain, orthopnea and PND.  Gastrointestinal: Negative for abdominal pain, diarrhea, nausea and vomiting.  Genitourinary: Negative for dysuria and hematuria.  Neurological: Positive for weakness (Generalized). Negative for dizziness, sensory change and focal weakness.  All other systems reviewed and are negative.   Nutrition: Heart Healthy/Carb control Tolerating Diet: Yes Tolerating PT: Eval noted.   DRUG ALLERGIES:  No Known Allergies  VITALS:  Blood pressure (!) 167/69, pulse 62, temperature 98.4 F (36.9 C), temperature source Oral, resp. rate 18, height '5\' 8"'$  (1.727 m), weight 72.7 kg (160 lb 3.2 oz), SpO2 98 %.  PHYSICAL EXAMINATION:   Physical Exam  GENERAL:  84 y.o.-year-old patient lying in bed in NAD.   EYES: Pupils equal, round, reactive to light and accommodation. No scleral icterus. Extraocular muscles intact.  HEENT: Head atraumatic, normocephalic. Oropharynx and nasopharynx clear.  NECK:  Supple, no jugular venous distention. No thyroid enlargement, no tenderness.  LUNGS: Normal breath sounds bilaterally, no wheezing, rales, rhonchi. No use of accessory muscles of respiration.  CARDIOVASCULAR: S1, S2 normal. No murmurs, rubs, or gallops.  ABDOMEN: Soft, nontender, nondistended. Bowel sounds present. No organomegaly or mass.  EXTREMITIES: No cyanosis, clubbing or edema b/l.    NEUROLOGIC: Cranial nerves  II through XII are intact. No focal Motor or sensory deficits b/l. Globally weak.    PSYCHIATRIC: The patient is alert and oriented x 3.  SKIN: No obvious rash, lesion, or ulcer.    LABORATORY PANEL:   CBC Recent Labs  Lab 05/31/18 0905  WBC 5.9  HGB 10.6*  HCT 32.1*  PLT 158   ------------------------------------------------------------------------------------------------------------------  Chemistries  Recent Labs  Lab 05/31/18 0905  NA 136  K 3.7  CL 105  CO2 24  GLUCOSE 107*  BUN 15  CREATININE 1.15  CALCIUM 7.8*  AST 31  ALT 16*  ALKPHOS 51  BILITOT 0.8   ------------------------------------------------------------------------------------------------------------------  Cardiac Enzymes Recent Labs  Lab 05/31/18 0905  TROPONINI 0.04*   ------------------------------------------------------------------------------------------------------------------  RADIOLOGY:  Dg Chest 2 View  Result Date: 05/30/2018 CLINICAL DATA:  Unable to ambulate EXAM: CHEST - 2 VIEW COMPARISON:  02/01/2018, 05/25/2011 FINDINGS: Post sternotomy changes with multiple fractured sternal wires as before. No significant pleural effusion. Scarring or atelectasis at the lingula and left base. Stable cardiomediastinal silhouette with aortic atherosclerosis. No pneumothorax. IMPRESSION: Scarring or atelectasis at the lingula and left base. No definite acute interval change compared to prior radiograph. Electronically Signed   By: Donavan Foil M.D.   On: 05/30/2018 19:17   Mr Brain Wo Contrast  Result Date: 06/01/2018 CLINICAL DATA:  Confusion for 3 days, febrile. History of hypertension, hyperlipidemia, diabetes. EXAM: MRI HEAD WITHOUT CONTRAST TECHNIQUE: Multiplanar, multiecho pulse sequences of the brain and surrounding structures were obtained without intravenous contrast. COMPARISON:  CT HEAD May 30, 2018 and MRI head April 03, 2018. FINDINGS: INTRACRANIAL CONTENTS: No reduced diffusion to  suggest acute ischemia. No susceptibility artifact to suggest hemorrhage.  The ventricles and sulci are normal for patient's age. Confluent supratentorial white matter FLAIR T2 hyperintensities. Prominent basal ganglia perivascular spaces or chronic small vessel ischemic changes. Minimal mesial LEFT parietal lobe encephalomalacia. No suspicious parenchymal signal, masses, mass effect. No abnormal extra-axial fluid collections. No extra-axial masses. VASCULAR: Normal major intracranial vascular flow voids present at skull base. SKULL AND UPPER CERVICAL SPINE: No abnormal sellar expansion. No suspicious calvarial bone marrow signal. Craniocervical junction maintained. SINUSES/ORBITS: The mastoid air-cells and included paranasal sinuses are well-aerated.The included ocular globes and orbital contents are non-suspicious. Status post bilateral ocular lens implants. Old medial RIGHT orbital wall fracture. OTHER: None. IMPRESSION: 1. No acute intracranial process. 2. Severe chronic small vessel ischemic changes. 3. Old small LEFT parietal lobe infarct. Electronically Signed   By: Elon Alas M.D.   On: 06/01/2018 02:26   Ct Head Code Stroke Wo Contrast  Result Date: 05/30/2018 CLINICAL DATA:  Code stroke. RIGHT-sided weakness, unable to ambulate. History of hypertension, hyperlipidemia. EXAM: CT HEAD WITHOUT CONTRAST TECHNIQUE: Contiguous axial images were obtained from the base of the skull through the vertex without intravenous contrast. COMPARISON:  MRI of the head April 03, 2018 and CT HEAD February 01, 2018. FINDINGS: BRAIN: No intraparenchymal hemorrhage, mass effect nor midline shift. The ventricles and sulci are normal for age. Patchy supratentorial white matter hypodensities within normal range for patient's age, though non-specific are most compatible with chronic small vessel ischemic disease. No acute large vascular territory infarcts. No abnormal extra-axial fluid collections. Basal cisterns are  patent. VASCULAR: Severe calcific atherosclerosis of the carotid siphons. SKULL: No skull fracture. No significant scalp soft tissue swelling. SINUSES/ORBITS: The mastoid air-cells and included paranasal sinuses are well-aerated.The included ocular globes and orbital contents are non-suspicious. Status post bilateral ocular lens implants. OTHER: None. ASPECTS [facility] Stroke Program Early CT Score) - Ganglionic level infarction (caudate, lentiform nuclei, internal capsule, insula, M1-M3 cortex): 7 - Supraganglionic infarction (M4-M6 cortex): 3 Total score (0-10 with 10 being normal): 10 IMPRESSION: 1. No acute intracranial process. 2. Stable examination with severe chronic small vessel ischemic changes and severe atherosclerosis. 3. ASPECTS is 10. 4. Critical Value/emergent results were called by telephone at the time of interpretation on 05/30/2018 at 6:20 pm to Dr. Clearnce Hasten, who verbally acknowledged these results. Electronically Signed   By: Elon Alas M.D.   On: 05/30/2018 18:21     ASSESSMENT AND PLAN:   84 year old male with past medical history of diabetes, osteoarthritis, hypertension, hyperlipidemia, history of coronary artery disease who presents to the hospital due to fever, chills and generalized weakness  1.  Sepsis-patient met criteria given his fever, tachycardia. - No source of sepsis has been identified.  Patient's chest x-ray negative for acute pathology, urinalysis showing no overt UTI. -Patient's flu test was negative.  Will d/c Vanc and cont. Zosyn for now.  - Cultures remain (-) so far.   2.  Elevated troponin-likely secondary to supply demand ischemia.  Patient's troponins have remained flat. - will d/c tele.  3.  Acute kidney injury-secondary to volume loss/fever. -Improved with IV fluid hydration.  Creatinine is at baseline now.  4.  Generalized weakness/lethargy-etiology unclear but suspected to be secondary to underlying sepsis.  CT head was negative for acute  pathology on admission.  MRI Brain was also (-) yesterday.  - seen by PT today and pt. Has profoundly improved since yesterday and will cont. To monitor.   5.  Essential hypertension-continue lisinopril, metoprolol.  6.  GERD-continue Protonix.  7.  HyperLipidemia-continue Pravachol.  Possible d/c home tomorrow on Oral abx if remains stable overnight.   All the records are reviewed and case discussed with Care Management/Social Worker. Management plans discussed with the patient, family and they are in agreement.  CODE STATUS: Full code  DVT Prophylaxis: Lovenox  TOTAL TIME TAKING CARE OF THIS PATIENT: 30 minutes.   POSSIBLE D/C IN 1-2 DAYS, DEPENDING ON CLINICAL CONDITION.   Henreitta Leber M.D on 06/01/2018 at 12:18 PM  Between 7am to 6pm - Pager - [phone]  After 6pm go to [email or web address] - Proofreader  [facility]  Office  [phone]  CC: Primary care physician; Teodoro Spray, MD

## 2018-06-02 LAB — CBC
HEMATOCRIT: 32 % — AB (ref 40.0–52.0)
Hemoglobin: 10.9 g/dL — ABNORMAL LOW (ref 13.0–18.0)
MCH: 30.8 pg (ref 26.0–34.0)
MCHC: 34 g/dL (ref 32.0–36.0)
MCV: 90.7 fL (ref 80.0–100.0)
PLATELETS: 139 10*3/uL — AB (ref 150–440)
RBC: 3.53 MIL/uL — ABNORMAL LOW (ref 4.40–5.90)
RDW: 14.5 % (ref 11.5–14.5)
WBC: 5.7 10*3/uL (ref 3.8–10.6)

## 2018-06-02 LAB — BASIC METABOLIC PANEL
Anion gap: 8 (ref 5–15)
BUN: 14 mg/dL (ref 6–20)
CALCIUM: 8.2 mg/dL — AB (ref 8.9–10.3)
CO2: 26 mmol/L (ref 22–32)
CREATININE: 1.17 mg/dL (ref 0.61–1.24)
Chloride: 104 mmol/L (ref 101–111)
GFR calc Af Amer: >60 mL/min (ref >60)
GFR calc non Af Amer: 56 mL/min — ABNORMAL LOW (ref >60)
Glucose, Bld: 118 mg/dL — ABNORMAL HIGH (ref 65–99)
Potassium: 3.1 mmol/L — ABNORMAL LOW (ref 3.5–5.1)
Sodium: 138 mmol/L (ref 135–145)

## 2018-06-02 LAB — URINE CULTURE: Culture: NO GROWTH

## 2018-06-02 LAB — GLUCOSE, CAPILLARY: Glucose-Capillary: 114 mg/dL — ABNORMAL HIGH (ref 65–99)

## 2018-06-02 MED ORDER — AMOXICILLIN-POT CLAVULANATE 875-125 MG PO TABS: 1.0000 | ORAL_TABLET | Freq: Two times a day (BID) | ORAL | 0 refills | Status: AC

## 2018-06-02 NOTE — Discharge Summary (Signed)
[facility] NAME: Michael Simon    MR#:  [id number]  DATE OF BIRTH:  [birth year]  DATE OF ADMISSION:  05/30/2018 ADMITTING PHYSICIAN: Nicholes Mango, MD  DATE OF DISCHARGE: 06/02/2018 10:21 AM  PRIMARY CARE PHYSICIAN: Teodoro Spray, MD    ADMISSION DIAGNOSIS:  Delirium [R41.0] Fever, unspecified fever cause [R50.9]  DISCHARGE DIAGNOSIS:  Active Problems:   Fever   SECONDARY DIAGNOSIS:   Past Medical History:  Diagnosis Date  . CAD (coronary artery disease)   . HLD (hyperlipidemia)   . HTN (hypertension)   . LBBB (left bundle branch block)   . Osteoarthritis   . Type II diabetes mellitus [facility])     HOSPITAL COURSE:   84 year old male with past medical history of diabetes, osteoarthritis, hypertension, hyperlipidemia, history of coronary artery disease who presents to the hospital due to fever, chills and generalized weakness  1.  Sepsis-patient met criteria given his fever, tachycardia on admission.  Source of sepsis was not identified while the patient was in the hospital.  Patient's chest x-ray was negative for acute pathology urinalysis and urine cultures remain negative.  Patient blood cultures were also negative. - Empirically patient was treated with IV vancomycin, Zosyn.  Antibiotics were de-escalated to just IV Zosyn. - Patient is currently afebrile and hemodynamically stable and therefore being discharged empirically on oral Augmentin.  2.  Elevated troponin-likely secondary to supply demand ischemia.  Patient's troponins remained flat and he had no symptoms.   3.  Acute kidney injury-secondary to volume loss/fever. -Improved with IV fluid hydration.  Creatinine is at baseline now.  4.  Generalized weakness/lethargy- secondary to deconditioning and underlying sepsis. - Patient did undergo neurologic work-up given his profound weakness with a CT head and MRI brain which were all negative. -Initially patient  was seen by physical therapy and the recommended short-term rehab but upon reevaluation patient has significantly improved and not being discharged with home health services.  5.  Essential hypertension- pt. Will continue lisinopril, metoprolol.  6.  GERD- pt. Will continue Protonix.  7.  HyperLipidemia- pt. Will continue Pravachol.   DISCHARGE CONDITIONS:   Stable  CONSULTS OBTAINED:    DRUG ALLERGIES:  No Known Allergies  DISCHARGE MEDICATIONS:   Allergies as of 06/02/2018   No Known Allergies     Medication List    TAKE these medications   acetaminophen 325 MG tablet Commonly known as:  TYLENOL Take 2 tablets (650 mg total) by mouth every 6 (six) hours as needed for mild pain (or Fever >/= 101).   amoxicillin-clavulanate 875-125 MG tablet Commonly known as:  AUGMENTIN Take 1 tablet by mouth 2 (two) times daily for 5 days.   aspirin EC 81 MG tablet Take 81 mg by mouth daily.   CENTRUM SILVER tablet Take 1 tablet by mouth daily.   iron polysaccharides 150 MG capsule Commonly known as:  NIFEREX Take 150 mg by mouth daily.   lisinopril 30 MG tablet Commonly known as:  PRINIVIL,ZESTRIL Take 30 mg by mouth daily.   lovastatin 40 MG tablet Commonly known as:  MEVACOR Take 40 mg by mouth at bedtime.   metoprolol tartrate 25 MG tablet Commonly known as:  LOPRESSOR Take 12.5 mg by mouth 2 (two) times daily.   pantoprazole 40 MG tablet Commonly known as:  PROTONIX Take 1 tablet (40 mg total) by mouth daily.   RA VITAMIN B-12 TR 1000 MCG Tbcr Generic drug:  Cyanocobalamin Take 1,000 mcg  by mouth daily.   SLOW FE 142 (45 Fe) MG Tbcr Generic drug:  Ferrous Sulfate Take 1 tablet by mouth daily.         DISCHARGE INSTRUCTIONS:   DIET:  Cardiac diet  DISCHARGE CONDITION:  Stable  ACTIVITY:  Activity as tolerated  OXYGEN:  Home Oxygen: No.   Oxygen Delivery: room air  DISCHARGE LOCATION:  Home with [place], [place].    If you  experience worsening of your admission symptoms, develop shortness of breath, life threatening emergency, suicidal or homicidal thoughts you must seek medical attention immediately by calling 911 or calling your MD immediately  if symptoms less severe.  You Must read complete instructions/literature along with all the possible adverse reactions/side effects for all the Medicines you take and that have been prescribed to you. Take any new Medicines after you have completely understood and accpet all the possible adverse reactions/side effects.   Please note  You were cared for by a hospitalist during your hospital stay. If you have any questions about your discharge medications or the care you received while you were in the hospital after you are discharged, you can call the unit and asked to speak with the hospitalist on call if the hospitalist that took care of you is not available. Once you are discharged, your primary care physician will handle any further medical issues. Please note that NO REFILLS for any discharge medications will be authorized once you are discharged, as it is imperative that you return to your primary care physician (or establish a relationship with a primary care physician if you do not have one) for your aftercare needs so that they can reassess your need for medications and monitor your lab values.     Today   No fever overnight. Weakness much improved. Cultures remain (-).    VITAL SIGNS:  Blood pressure (!) 146/58, pulse 62, temperature 97.6 F (36.4 C), temperature source Oral, resp. rate 18, height '5\' 8"'$  (1.093 m), weight 72.7 kg (160 lb 3.2 oz), SpO2 98 %.  I/O:    Intake/Output Summary (Last 24 hours) at 06/02/2018 1222 Last data filed at 06/02/2018 0500 Gross per 24 hour  Intake 50 ml  Output 820 ml  Net -770 ml    PHYSICAL EXAMINATION:  GENERAL:  84 y.o.-year-old patient lying in the bed with no acute distress.  EYES: Pupils equal, round, reactive to  light and accommodation. No scleral icterus. Extraocular muscles intact.  HEENT: Head atraumatic, normocephalic. Oropharynx and nasopharynx clear.  NECK:  Supple, no jugular venous distention. No thyroid enlargement, no tenderness.  LUNGS: Normal breath sounds bilaterally, no wheezing, rales,rhonchi. No use of accessory muscles of respiration.  CARDIOVASCULAR: S1, S2 normal.  II/VI SEM at RSB, no rubs, or gallops.  ABDOMEN: Soft, non-tender, non-distended. Bowel sounds present. No organomegaly or mass.  EXTREMITIES: No pedal edema, cyanosis, or clubbing.  NEUROLOGIC: Cranial nerves II through XII are intact. No focal motor or sensory defecits b/l.  PSYCHIATRIC: The patient is alert and oriented x 3.  SKIN: No obvious rash, lesion, or ulcer.   DATA REVIEW:   CBC Recent Labs  Lab 06/02/18 0456  WBC 5.7  HGB 10.9*  HCT 32.0*  PLT 139*    Chemistries  Recent Labs  Lab 05/31/18 0905 06/02/18 0456  NA 136 138  K 3.7 3.1*  CL 105 104  CO2 24 26  GLUCOSE 107* 118*  BUN 15 14  CREATININE 1.15 1.17  CALCIUM 7.8* 8.2*  AST 31  --   ALT 16*  --   ALKPHOS 51  --   BILITOT 0.8  --     Cardiac Enzymes Recent Labs  Lab 05/31/18 0905  TROPONINI 0.04*    Microbiology Results  Results for orders placed or performed during the hospital encounter of 05/30/18  Blood Culture (routine x 2)     Status: None (Preliminary result)   Collection Time: 05/30/18  6:41 PM  Result Value Ref Range Status   Specimen Description BLOOD BLOOD LEFT ARM  Final   Special Requests   Final    BOTTLES DRAWN AEROBIC AND ANAEROBIC Blood Culture adequate volume   Culture   Final    NO GROWTH 3 DAYS Performed at [facility], [address], [place], [place] [address]    Report Status PENDING  Incomplete  Blood Culture (routine x 2)     Status: None (Preliminary result)   Collection Time: 05/30/18  6:46 PM  Result Value Ref Range Status   Specimen Description BLOOD RIGHT ANTECUBITAL  Final    Special Requests   Final    BOTTLES DRAWN AEROBIC AND ANAEROBIC Blood Culture results may not be optimal due to an excessive volume of blood received in culture bottles   Culture   Final    NO GROWTH 3 DAYS Performed at [facility], [address], [place], [place] [address]    Report Status PENDING  Incomplete  MRSA PCR Screening     Status: Abnormal   Collection Time: 05/31/18  3:50 AM  Result Value Ref Range Status   MRSA by PCR POSITIVE (A) NEGATIVE Final    Comment:        The GeneXpert MRSA Assay (FDA approved for NASAL specimens only), is one component of a comprehensive MRSA colonization surveillance program. It is not intended to diagnose MRSA infection nor to guide or monitor treatment for MRSA infections. RESULT CALLED TO, READ BACK BY AND VERIFIED WITH: YUCONNA CROSS AT [place] ON 05/31/2018 JJB Performed at [facility], [address], [place], [place] [address]   Urine Culture     Status: None   Collection Time: 05/31/18 10:17 PM  Result Value Ref Range Status   Specimen Description   Final    URINE, RANDOM Performed at [facility], [address] [place], [place] [address]    Special Requests   Final    NONE Performed at [facility], [address] [place], [place] [address]    Culture   Final    NO GROWTH Performed at [facility], [place] [address], [place], [place] [address]    Report Status 06/02/2018 FINAL  Final    RADIOLOGY:  Mr Brain Wo Contrast  Result Date: 06/01/2018 CLINICAL DATA:  Confusion for 3 days, febrile. History of hypertension, hyperlipidemia, diabetes. EXAM: MRI HEAD WITHOUT CONTRAST TECHNIQUE: Multiplanar, multiecho pulse sequences of the brain and surrounding structures were obtained without intravenous contrast. COMPARISON:  CT HEAD May 30, 2018 and MRI head April 03, 2018. FINDINGS: INTRACRANIAL CONTENTS: No reduced diffusion to suggest acute ischemia. No susceptibility artifact to  suggest hemorrhage. The ventricles and sulci are normal for patient's age. Confluent supratentorial white matter FLAIR T2 hyperintensities. Prominent basal ganglia perivascular spaces or chronic small vessel ischemic changes. Minimal mesial LEFT parietal lobe encephalomalacia. No suspicious parenchymal signal, masses, mass effect. No abnormal extra-axial fluid collections. No extra-axial masses. VASCULAR: Normal major intracranial vascular flow voids present at skull base. SKULL  AND UPPER CERVICAL SPINE: No abnormal sellar expansion. No suspicious calvarial bone marrow signal. Craniocervical junction maintained. SINUSES/ORBITS: The mastoid air-cells and included paranasal sinuses are well-aerated.The included ocular globes and orbital contents are non-suspicious. Status post bilateral ocular lens implants. Old medial RIGHT orbital wall fracture. OTHER: None. IMPRESSION: 1. No acute intracranial process. 2. Severe chronic small vessel ischemic changes. 3. Old small LEFT parietal lobe infarct. Electronically Signed   By: Elon Alas M.D.   On: 06/01/2018 02:26      Management plans discussed with the patient, family and they are in agreement.  CODE STATUS:     Code Status Orders  (From admission, onward)        Start     Ordered   05/30/18 2048  Full code  Continuous     05/30/18 2047   TOTAL TIME TAKING CARE OF THIS PATIENT: 40 minutes.    Henreitta Leber M.D on 06/02/2018 at 12:22 PM  Between 7am to 6pm - Pager - [phone]  After 6pm go to [email or web address] - Proofreader  [facility]  Office  [phone]  CC: Primary care physician; Teodoro Spray, MD

## 2018-06-02 NOTE — Care Management Note (Signed)
Case Management Note  Patient Details  Name: Michael Simon MRN: [id number] Date of Birth: 11/29/1933  Subjective/Objective:        Patient to be discharged per MD order. Orders in place for [facility] and PT services. Patient provided choice and has selected [facility]. Referral placed with [facility] who accepted the patient. [facility] in use at home. No other DME needs. Lives with spouse [name] [phone] but also has support form children and brother. PCP is Dr Candiss Norse. Utilizes [facility] on [place] and obtains medications without concerns. RNCM to sign off. Ines Bloomer RN BSN RNCM [phone]             Action/Plan:   Expected Discharge Date:  06/02/18               Expected Discharge Plan:     In-House Referral:     Discharge planning Services  CM Consult  Post Acute Care Choice:  Home Health Choice offered to:  Patient  DME Arranged:    DME Agency:  [facility] Arranged:  RN, PT [facility] Agency:  [facility]  Status of Service:  Completed, signed off  If discussed at [place] of Stay Meetings, dates discussed:    Additional Comments:  Latanya Maudlin, RN 06/02/2018, 11:55 AM

## 2018-06-02 NOTE — Progress Notes (Signed)
Pt being discharged home with home health, discharge instructions and prescription reviewed with pt and son, states understanding, pt with no complaints at discharge

## 2018-06-04 LAB — CULTURE, BLOOD (ROUTINE X 2)
CULTURE: NO GROWTH
CULTURE: NO GROWTH
Special Requests: ADEQUATE

## 2018-06-24 DIAGNOSIS — I10 Essential (primary) hypertension: Secondary | ICD-10-CM | POA: Diagnosis not present

## 2018-08-01 DIAGNOSIS — E119 Type 2 diabetes mellitus without complications: Secondary | ICD-10-CM | POA: Diagnosis not present

## 2018-08-15 DIAGNOSIS — R410 Disorientation, unspecified: Secondary | ICD-10-CM | POA: Diagnosis not present

## 2018-08-15 DIAGNOSIS — R413 Other amnesia: Secondary | ICD-10-CM | POA: Diagnosis not present

## 2018-09-12 DIAGNOSIS — E7800 Pure hypercholesterolemia, unspecified: Secondary | ICD-10-CM | POA: Diagnosis not present

## 2018-09-12 DIAGNOSIS — E119 Type 2 diabetes mellitus without complications: Secondary | ICD-10-CM | POA: Diagnosis not present

## 2018-09-12 DIAGNOSIS — I2584 Coronary atherosclerosis due to calcified coronary lesion: Secondary | ICD-10-CM | POA: Diagnosis not present

## 2018-09-12 DIAGNOSIS — I10 Essential (primary) hypertension: Secondary | ICD-10-CM | POA: Diagnosis not present

## 2018-09-12 DIAGNOSIS — I2510 Atherosclerotic heart disease of native coronary artery without angina pectoris: Secondary | ICD-10-CM | POA: Diagnosis not present

## 2018-09-12 DIAGNOSIS — I447 Left bundle-branch block, unspecified: Secondary | ICD-10-CM | POA: Diagnosis not present

## 2018-10-05 DIAGNOSIS — J019 Acute sinusitis, unspecified: Secondary | ICD-10-CM | POA: Diagnosis not present

## 2018-12-24 DIAGNOSIS — I119 Hypertensive heart disease without heart failure: Secondary | ICD-10-CM | POA: Diagnosis not present

## 2018-12-24 DIAGNOSIS — R29818 Other symptoms and signs involving the nervous system: Secondary | ICD-10-CM | POA: Diagnosis not present

## 2018-12-24 DIAGNOSIS — I2510 Atherosclerotic heart disease of native coronary artery without angina pectoris: Secondary | ICD-10-CM | POA: Diagnosis not present

## 2018-12-24 DIAGNOSIS — E669 Obesity, unspecified: Secondary | ICD-10-CM | POA: Diagnosis not present

## 2019-05-13 DIAGNOSIS — I10 Essential (primary) hypertension: Secondary | ICD-10-CM | POA: Diagnosis not present

## 2019-05-13 DIAGNOSIS — E785 Hyperlipidemia, unspecified: Secondary | ICD-10-CM | POA: Diagnosis not present

## 2019-05-13 DIAGNOSIS — E119 Type 2 diabetes mellitus without complications: Secondary | ICD-10-CM | POA: Diagnosis not present

## 2019-05-13 DIAGNOSIS — D649 Anemia, unspecified: Secondary | ICD-10-CM | POA: Diagnosis not present

## 2019-05-13 DIAGNOSIS — R7989 Other specified abnormal findings of blood chemistry: Secondary | ICD-10-CM | POA: Diagnosis not present

## 2019-05-14 DIAGNOSIS — R5381 Other malaise: Secondary | ICD-10-CM | POA: Diagnosis not present

## 2019-05-14 DIAGNOSIS — I10 Essential (primary) hypertension: Secondary | ICD-10-CM | POA: Diagnosis not present

## 2019-05-14 DIAGNOSIS — E119 Type 2 diabetes mellitus without complications: Secondary | ICD-10-CM | POA: Diagnosis not present

## 2019-05-14 DIAGNOSIS — E7849 Other hyperlipidemia: Secondary | ICD-10-CM | POA: Diagnosis not present

## 2019-05-16 DIAGNOSIS — I10 Essential (primary) hypertension: Secondary | ICD-10-CM | POA: Diagnosis not present

## 2019-05-16 DIAGNOSIS — E119 Type 2 diabetes mellitus without complications: Secondary | ICD-10-CM | POA: Diagnosis not present

## 2019-05-16 DIAGNOSIS — E785 Hyperlipidemia, unspecified: Secondary | ICD-10-CM | POA: Diagnosis not present

## 2019-05-27 DIAGNOSIS — I119 Hypertensive heart disease without heart failure: Secondary | ICD-10-CM | POA: Diagnosis not present

## 2019-05-27 DIAGNOSIS — I2510 Atherosclerotic heart disease of native coronary artery without angina pectoris: Secondary | ICD-10-CM | POA: Diagnosis not present

## 2019-05-27 DIAGNOSIS — I429 Cardiomyopathy, unspecified: Secondary | ICD-10-CM | POA: Diagnosis not present

## 2019-05-27 DIAGNOSIS — E669 Obesity, unspecified: Secondary | ICD-10-CM | POA: Diagnosis not present

## 2019-06-24 DIAGNOSIS — I447 Left bundle-branch block, unspecified: Secondary | ICD-10-CM | POA: Diagnosis not present

## 2019-06-24 DIAGNOSIS — E119 Type 2 diabetes mellitus without complications: Secondary | ICD-10-CM | POA: Diagnosis not present

## 2019-06-24 DIAGNOSIS — I10 Essential (primary) hypertension: Secondary | ICD-10-CM | POA: Diagnosis not present

## 2019-06-24 DIAGNOSIS — E785 Hyperlipidemia, unspecified: Secondary | ICD-10-CM | POA: Diagnosis not present

## 2019-07-07 DIAGNOSIS — E119 Type 2 diabetes mellitus without complications: Secondary | ICD-10-CM | POA: Diagnosis not present

## 2019-07-25 DIAGNOSIS — R5381 Other malaise: Secondary | ICD-10-CM | POA: Diagnosis not present

## 2019-07-25 DIAGNOSIS — E119 Type 2 diabetes mellitus without complications: Secondary | ICD-10-CM | POA: Diagnosis not present

## 2019-07-25 DIAGNOSIS — I10 Essential (primary) hypertension: Secondary | ICD-10-CM | POA: Diagnosis not present

## 2019-07-25 DIAGNOSIS — E7849 Other hyperlipidemia: Secondary | ICD-10-CM | POA: Diagnosis not present

## 2019-08-08 DIAGNOSIS — D649 Anemia, unspecified: Secondary | ICD-10-CM | POA: Diagnosis not present

## 2019-08-08 DIAGNOSIS — R195 Other fecal abnormalities: Secondary | ICD-10-CM | POA: Diagnosis not present

## 2019-08-08 DIAGNOSIS — E119 Type 2 diabetes mellitus without complications: Secondary | ICD-10-CM | POA: Diagnosis not present

## 2019-08-08 DIAGNOSIS — R5381 Other malaise: Secondary | ICD-10-CM | POA: Diagnosis not present

## 2019-08-08 DIAGNOSIS — R7989 Other specified abnormal findings of blood chemistry: Secondary | ICD-10-CM | POA: Diagnosis not present

## 2019-08-08 DIAGNOSIS — Z23 Encounter for immunization: Secondary | ICD-10-CM | POA: Diagnosis not present

## 2019-08-15 ENCOUNTER — Other Ambulatory Visit: Payer: Self-pay

## 2019-08-15 ENCOUNTER — Telehealth: Payer: Self-pay

## 2019-08-15 ENCOUNTER — Encounter: Payer: Self-pay | Admitting: Internal Medicine

## 2019-08-15 DIAGNOSIS — Z1211 Encounter for screening for malignant neoplasm of colon: Secondary | ICD-10-CM

## 2019-08-15 MED ORDER — NA SULFATE-K SULFATE-MG SULF 17.5-3.13-1.6 GM/177ML PO SOLN: 1.0000 | Freq: Once | ORAL | 0 refills | Status: AC

## 2019-08-15 NOTE — Telephone Encounter (Signed)
Gastroenterology Pre-Procedure Review  Request Date: 08/26/19 Requesting Physician: Dr. Vicente Males  PATIENT REVIEW QUESTIONS: The patient responded to the following health history questions as indicated:    1. Are you having any GI issues? yes (bowels not moving right) 2. Do you have a personal history of Polyps? no 3. Do you have a family history of Colon Cancer or Polyps? no 4. Diabetes Mellitus? no 5. Joint replacements in the past 12 months?no 6. Major health problems in the past 3 months?no 7. Any artificial heart valves, MVP, or defibrillator?no    MEDICATIONS & ALLERGIES:    Patient reports the following regarding taking any anticoagulation/antiplatelet therapy:   Plavix, Coumadin, Eliquis, Xarelto, Lovenox, Pradaxa, Brilinta, or Effient? no Aspirin? no  Patient confirms/reports the following medications:  Current Outpatient Medications  Medication Sig Dispense Refill  . acetaminophen (TYLENOL) 325 MG tablet Take 2 tablets (650 mg total) by mouth every 6 (six) hours as needed for mild pain (or Fever >/= 101). 20 tablet 0  . aspirin EC 81 MG tablet Take 81 mg by mouth daily.     . Cyanocobalamin (RA VITAMIN B-12 TR) 1000 MCG TBCR Take 1,000 mcg by mouth daily.     . Ferrous Sulfate (SLOW FE) 142 (45 Fe) MG TBCR Take 1 tablet by mouth daily.    . iron polysaccharides (NIFEREX) 150 MG capsule Take 150 mg by mouth daily.    Marland Kitchen lisinopril (PRINIVIL,ZESTRIL) 30 MG tablet Take 30 mg by mouth daily.     Marland Kitchen lovastatin (MEVACOR) 40 MG tablet Take 40 mg by mouth at bedtime.    . metoprolol tartrate (LOPRESSOR) 25 MG tablet Take 12.5 mg by mouth 2 (two) times daily.    . Multiple Vitamins-Minerals (CENTRUM SILVER) tablet Take 1 tablet by mouth daily.    . Na Sulfate-K Sulfate-Mg Sulf 17.5-3.13-1.6 GM/177ML SOLN Take 1 kit by mouth once for 1 dose. 354 mL 0  . pantoprazole (PROTONIX) 40 MG tablet Take 1 tablet (40 mg total) by mouth daily. 90 tablet 1   No current facility-administered  medications for this visit.     Patient confirms/reports the following allergies:  No Known Allergies  No orders of the defined types were placed in this encounter.   AUTHORIZATION INFORMATION Primary Insurance: 1D#: Group #:  Secondary Insurance: 1D#: Group #:  SCHEDULE INFORMATION: Date: 08/26/19 Time: Location:[facility]

## 2019-08-22 ENCOUNTER — Other Ambulatory Visit
Admission: RE | Admit: 2019-08-22 | Discharge: 2019-08-22 | Disposition: A | Discharge status: Home / Self Care | Payer: Medicare HMO | Source: Ambulatory Visit | Attending: Gastroenterology | Admitting: Gastroenterology

## 2019-08-22 ENCOUNTER — Other Ambulatory Visit: Payer: Self-pay

## 2019-08-22 DIAGNOSIS — Z20828 Contact with and (suspected) exposure to other viral communicable diseases: Secondary | ICD-10-CM | POA: Insufficient documentation

## 2019-08-22 DIAGNOSIS — Z01812 Encounter for preprocedural laboratory examination: Secondary | ICD-10-CM | POA: Diagnosis present

## 2019-08-23 ENCOUNTER — Encounter: Payer: Self-pay | Admitting: Anesthesiology

## 2019-08-23 LAB — SARS CORONAVIRUS 2 (TAT 6-24 HRS): SARS Coronavirus 2: NEGATIVE

## 2019-08-26 ENCOUNTER — Ambulatory Visit: Payer: Medicare HMO | Admitting: Anesthesiology

## 2019-08-26 ENCOUNTER — Ambulatory Visit
Admission: RE | Admit: 2019-08-26 | Discharge: 2019-08-26 | Disposition: A | Discharge status: Home / Self Care | Payer: Medicare HMO | Attending: Gastroenterology | Admitting: Gastroenterology

## 2019-08-26 ENCOUNTER — Other Ambulatory Visit: Payer: Self-pay

## 2019-08-26 ENCOUNTER — Encounter
Admission: RE | Disposition: A | Discharge status: Home / Self Care | Payer: Self-pay | Source: Home / Self Care | Attending: Gastroenterology

## 2019-08-26 DIAGNOSIS — E785 Hyperlipidemia, unspecified: Secondary | ICD-10-CM | POA: Insufficient documentation

## 2019-08-26 DIAGNOSIS — Z1211 Encounter for screening for malignant neoplasm of colon: Secondary | ICD-10-CM | POA: Diagnosis not present

## 2019-08-26 DIAGNOSIS — Z951 Presence of aortocoronary bypass graft: Secondary | ICD-10-CM | POA: Diagnosis not present

## 2019-08-26 DIAGNOSIS — Z87891 Personal history of nicotine dependence: Secondary | ICD-10-CM | POA: Insufficient documentation

## 2019-08-26 DIAGNOSIS — Z79899 Other long term (current) drug therapy: Secondary | ICD-10-CM | POA: Diagnosis not present

## 2019-08-26 DIAGNOSIS — I252 Old myocardial infarction: Secondary | ICD-10-CM | POA: Insufficient documentation

## 2019-08-26 DIAGNOSIS — E119 Type 2 diabetes mellitus without complications: Secondary | ICD-10-CM | POA: Diagnosis not present

## 2019-08-26 DIAGNOSIS — I2510 Atherosclerotic heart disease of native coronary artery without angina pectoris: Secondary | ICD-10-CM | POA: Insufficient documentation

## 2019-08-26 DIAGNOSIS — I10 Essential (primary) hypertension: Secondary | ICD-10-CM | POA: Insufficient documentation

## 2019-08-26 DIAGNOSIS — Z7982 Long term (current) use of aspirin: Secondary | ICD-10-CM | POA: Insufficient documentation

## 2019-08-26 DIAGNOSIS — R194 Change in bowel habit: Secondary | ICD-10-CM | POA: Insufficient documentation

## 2019-08-26 HISTORY — PX: COLONOSCOPY WITH PROPOFOL: SHX5780

## 2019-08-26 HISTORY — DX: Acute myocardial infarction, unspecified: I21.9

## 2019-08-26 SURGERY — COLONOSCOPY WITH PROPOFOL
Anesthesia: General

## 2019-08-26 MED ORDER — PROPOFOL 10 MG/ML IV BOLUS
INTRAVENOUS | Status: DC | PRN
  Administered 2019-08-26: 10 mg via INTRAVENOUS
  Administered 2019-08-26: 60 mg via INTRAVENOUS

## 2019-08-26 MED ORDER — LIDOCAINE HCL (CARDIAC) PF 100 MG/5ML IV SOSY
PREFILLED_SYRINGE | INTRAVENOUS | Status: DC | PRN
  Administered 2019-08-26: 40 mg via INTRAVENOUS

## 2019-08-26 MED ORDER — PROPOFOL 500 MG/50ML IV EMUL
INTRAVENOUS | Status: AC
  Filled 2019-08-26: qty 50

## 2019-08-26 MED ORDER — PROPOFOL 500 MG/50ML IV EMUL
INTRAVENOUS | Status: DC | PRN
  Administered 2019-08-26: 120 ug/kg/min via INTRAVENOUS

## 2019-08-26 MED ORDER — SODIUM CHLORIDE 0.9 % IV SOLN
INTRAVENOUS | Status: DC
  Administered 2019-08-26: 09:00:00 via INTRAVENOUS

## 2019-08-26 NOTE — Op Note (Addendum)
[facility] Gastroenterology Patient Name: Michael Simon Procedure Date: 08/26/2019 8:48 AM MRN: [id number] Account #: [id number] Date of Birth: 01/04/1933 Admit Type: Outpatient Age: 85 Room: [facility] ENDO ROOM 1 Gender: Male Note Status: Finalized Procedure:            Colonoscopy Indications:          Change in bowel habits Providers:            [name] MD, MD Referring MD:         Javier Docker. [name], MD (Referring MD) Medicines:            Monitored Anesthesia Care Complications:        No immediate complications. Procedure:            Pre-Anesthesia Assessment:                       - Prior to the procedure, a History and Physical was                        performed, and patient medications, allergies and                        sensitivities were reviewed. The patient's tolerance of                        previous anesthesia was reviewed.                       - The risks and benefits of the procedure and the                        sedation options and risks were discussed with the                        patient. All questions were answered and informed                        consent was obtained.                       - ASA Grade Assessment: III - A patient with severe                        systemic disease.                       - Prior to the procedure, a History and Physical was                        performed, and patient medications and allergies were                        reviewed. The patient's tolerance of previous                        anesthesia was also reviewed. The risks and benefits of                        the procedure and the sedation options and risks were  discussed with the patient. All questions were                        answered, and informed consent was obtained.                        [Anticoagulant Agents]. [ASA Grade]. After reviewing                        the risks and benefits, the patient was deemed  in                        satisfactory condition to undergo the procedure.                       After obtaining informed consent, the colonoscope was                        passed under direct vision. Throughout the procedure,                        the patient's blood pressure, pulse, and oxygen                        saturations were monitored continuously. The                        Colonoscope was introduced through the anus with the                        intention of advancing to the cecum. The scope was                        advanced to the descending colon before the procedure                        was aborted. Medications were given. The colonoscopy                        was performed without difficulty. The patient tolerated                        the procedure well. The quality of the bowel                        preparation was unsatisfactory. Findings:      The perianal and digital rectal examinations were normal.      A large amount of stool was found in the rectum and in the sigmoid       colon, interfering with visualization. Impression:           - Preparation of the colon was unsatisfactory.                       - Stool in the rectum and in the sigmoid colon.                       - No specimens collected. Recommendation:       - Discharge patient to home (with escort).                       -  Resume previous diet.                       - Continue present medications.                       - Repeat colonoscopy in 2 weeks because the bowel                        preparation was suboptimal.                       - suggest pediatric colonoscope at next attempt as very                        tortious colon Procedure Code(s):    --- Professional ---                       [phone], 53, Colonoscopy, flexible; diagnostic, including                        collection of specimen(s) by brushing or washing, when                        performed (separate procedure) Diagnosis  Code(s):    --- Professional ---                       R19.4, Change in bowel habit CPT copyright 2019 American Medical Association. All rights reserved. The codes documented in this report are preliminary and upon coder review may  be revised to meet current compliance requirements. [name], MD [name] MD, MD 08/26/2019 9:16:22 AM This report has been signed electronically. Number of Addenda: 0 Note Initiated On: 08/26/2019 8:48 AM Total Procedure Duration: 0 hours 10 minutes 51 seconds  Estimated Blood Loss: Estimated blood loss: none.      [facility]

## 2019-08-26 NOTE — Anesthesia Procedure Notes (Signed)
Date/Time: 08/26/2019 9:00 AM Performed by: Doreen Salvage, CRNA Pre-anesthesia Checklist: Patient identified, Emergency Drugs available, Suction available and Patient being monitored Patient Re-evaluated:Patient Re-evaluated prior to induction Oxygen Delivery Method: Nasal cannula Induction Type: IV induction Dental Injury: Teeth and Oropharynx as per pre-operative assessment  Comments: Nasal cannula with etCO2 monitoring

## 2019-08-26 NOTE — Anesthesia Postprocedure Evaluation (Signed)
Anesthesia Post Note  Patient: Michael Simon  Procedure(s) Performed: COLONOSCOPY WITH PROPOFOL (N/A )  Patient location during evaluation: Endoscopy Anesthesia Type: General Level of consciousness: awake and alert Pain management: pain level controlled Vital Signs Assessment: post-procedure vital signs reviewed and stable Respiratory status: spontaneous breathing, nonlabored ventilation, respiratory function stable and patient connected to nasal cannula oxygen Cardiovascular status: blood pressure returned to baseline and stable Postop Assessment: no apparent nausea or vomiting Anesthetic complications: no     Last Vitals:  Vitals:   08/26/19 0929 08/26/19 0939  BP: (!) 109/51 (!) 114/53  Pulse: 70 60  Resp: 17 15  Temp:    SpO2: 99% 96%    Last Pain:  Vitals:   08/26/19 0939  TempSrc:   PainSc: 0-No pain                 Michael Simon S

## 2019-08-26 NOTE — Transfer of Care (Signed)
Immediate Anesthesia Transfer of Care Note  Patient: Michael Simon  Procedure(s) Performed: Procedure(s): COLONOSCOPY WITH PROPOFOL (N/A)  Patient Location: PACU and Endoscopy Unit  Anesthesia Type:General  Level of Consciousness: sedated  Airway & Oxygen Therapy: Patient Spontanous Breathing and Patient connected to nasal cannula oxygen  Post-op Assessment: Report given to RN and Post -op Vital signs reviewed and stable  Post vital signs: Reviewed and stable  Last Vitals:  Vitals:   08/26/19 0833 08/26/19 0919  BP: (!) 150/76 (!) 132/51  Pulse: 83 76  Resp: 16 17  Temp: 36.6 C   SpO2: 906% 89%    Complications: No apparent anesthesia complications

## 2019-08-26 NOTE — H&P (Signed)
[name], MD [address], [place], [place], [place], [address] [address], [place], [place], [place], [address] Phone: [phone]  Fax: [phone]  Primary Care Physician:  Teodoro Spray, MD   Pre-Procedure History & Physical: HPI:  Michael Simon is a 86 y.o. male is here for an colonoscopy.   Past Medical History:  Diagnosis Date  . CAD (coronary artery disease)   . HLD (hyperlipidemia)   . HTN (hypertension)   . LBBB (left bundle branch block)   . Myocardial infarction ([place])   . Osteoarthritis   . Type II diabetes mellitus ([place])     Past Surgical History:  Procedure Laterality Date  . CATARACT EXTRACTION    . COLONOSCOPY    . CORONARY ARTERY BYPASS GRAFT  2012  . INGUINAL HERNIA REPAIR    . JOINT REPLACEMENT     knees    Prior to Admission medications   Medication Sig Start Date End Date Taking? Authorizing Provider  Ferrous Sulfate (SLOW FE) 142 (45 Fe) MG TBCR Take 1 tablet by mouth daily.   Yes [provider]  iron polysaccharides (NIFEREX) 150 MG capsule Take 150 mg by mouth daily.   Yes [provider]  Multiple Vitamins-Minerals (CENTRUM SILVER) tablet Take 1 tablet by mouth daily. 02/11/18  Yes [provider]  acetaminophen (TYLENOL) 325 MG tablet Take 2 tablets (650 mg total) by mouth every 6 (six) hours as needed for mild pain (or Fever >/= 101). 02/02/18   Arrien, Jimmy Picket, MD  aspirin EC 81 MG tablet Take 81 mg by mouth daily.     [provider]  Cyanocobalamin (RA VITAMIN B-12 TR) 1000 MCG TBCR Take 1,000 mcg by mouth daily.     [provider]  lisinopril (PRINIVIL,ZESTRIL) 30 MG tablet Take 30 mg by mouth daily.     [provider]  lovastatin (MEVACOR) 40 MG tablet Take 40 mg by mouth at bedtime.    [provider]  metoprolol tartrate (LOPRESSOR) 25 MG tablet Take 12.5 mg by mouth 2 (two) times daily.    [provider]  pantoprazole (PROTONIX) 40  MG tablet Take 1 tablet (40 mg total) by mouth daily. 07/28/15   Glendon Axe, MD    Allergies as of 08/15/2019  . (No Known Allergies)    Family History  Problem Relation Age of Onset  . Arthritis Mother   . Heart disease Mother   . Heart disease Other     Social History   Socioeconomic History  . Marital status: Married    Spouse name: Not on file  . Number of children: Not on file  . Years of education: Not on file  . Highest education level: Not on file  Occupational History  . Not on file  Social Needs  . Financial resource strain: Not on file  . Food insecurity    Worry: Not on file    Inability: Not on file  . Transportation needs    Medical: Not on file    Non-medical: Not on file  Tobacco Use  . Smoking status: Former Smoker    Quit date: 03/03/2008    Years since quitting: 11.4  . Smokeless tobacco: Never Used  Substance and Sexual Activity  . Alcohol use: No    Alcohol/week: 0.0 standard drinks  . Drug use: No  . Sexual activity: Not on file  Lifestyle  . Physical activity    Days per week: Not on  file    Minutes per session: Not on file  . Stress: Not on file  Relationships  . Social Herbalist on phone: Not on file    Gets together: Not on file    Attends religious service: Not on file    Active member of club or organization: Not on file    Attends meetings of clubs or organizations: Not on file    Relationship status: Not on file  . Intimate partner violence    Fear of current or ex partner: Not on file    Emotionally abused: Not on file    Physically abused: Not on file    Forced sexual activity: Not on file  Other Topics Concern  . Not on file  Social History Narrative  . Not on file    Review of Systems: See HPI, otherwise negative ROS  Physical Exam: BP (!) 150/76   Pulse 83   Temp 97.8 F (36.6 C) (Tympanic)   Resp 16   Ht 5\' 8"  (1.727 m)   Wt 63.5 kg   SpO2 100%   BMI 21.29 kg/m  General:   Alert,  pleasant  and cooperative in NAD Head:  Normocephalic and atraumatic. Neck:  Supple; no masses or thyromegaly. Lungs:  Clear throughout to auscultation, normal respiratory effort.    Heart:  +S1, +S2, Regular rate and rhythm, No edema. Abdomen:  Soft, nontender and nondistended. Normal bowel sounds, without guarding, and without rebound.   Neurologic:  Alert and  oriented x4;  grossly normal neurologically.  Impression/Plan: Michael Simon is here for an colonoscopy to be performed for change ibn bowel; habits.Risks, benefits, limitations, and alternatives regarding  colonoscopy have been reviewed with the patient.  Questions have been answered.  All parties agreeable.   [name], MD  08/26/2019, 8:42 AM

## 2019-08-26 NOTE — Anesthesia Post-op Follow-up Note (Signed)
Anesthesia QCDR form completed.        

## 2019-08-26 NOTE — Anesthesia Preprocedure Evaluation (Signed)
Anesthesia Evaluation  Patient identified by MRN, date of birth, ID band Patient awake    Reviewed: Allergy & Precautions, NPO status , Patient's Chart, lab work & pertinent test results, reviewed documented beta blocker date and time   Airway Mallampati: II  TM Distance: >3 FB     Dental  (+) Chipped   Pulmonary former smoker,           Cardiovascular hypertension, Pt. on medications and Pt. on home beta blockers + CAD and + CABG  + dysrhythmias      Neuro/Psych    GI/Hepatic   Endo/Other  diabetes, Type 2  Renal/GU      Musculoskeletal  (+) Arthritis ,   Abdominal   Peds  Hematology   Anesthesia Other Findings Lbbb. and iRbbb.  Reproductive/Obstetrics                             Anesthesia Physical Anesthesia Plan  ASA: III  Anesthesia Plan: General   Post-op Pain Management:    Induction: Intravenous  PONV Risk Score and Plan:   Airway Management Planned:   Additional Equipment:   Intra-op Plan:   Post-operative Plan:   Informed Consent: I have reviewed the patients History and Physical, chart, labs and discussed the procedure including the risks, benefits and alternatives for the proposed anesthesia with the patient or authorized representative who has indicated his/her understanding and acceptance.       Plan Discussed with: CRNA  Anesthesia Plan Comments:         Anesthesia Quick Evaluation

## 2019-08-27 ENCOUNTER — Encounter: Payer: Self-pay | Admitting: Gastroenterology

## 2019-09-03 ENCOUNTER — Other Ambulatory Visit: Payer: Self-pay

## 2019-09-03 DIAGNOSIS — Z1211 Encounter for screening for malignant neoplasm of colon: Secondary | ICD-10-CM

## 2019-09-03 MED ORDER — NA SULFATE-K SULFATE-MG SULF 17.5-3.13-1.6 GM/177ML PO SOLN: 1.0000 | Freq: Once | ORAL | 0 refills | Status: AC

## 2019-09-18 ENCOUNTER — Other Ambulatory Visit
Admission: RE | Admit: 2019-09-18 | Discharge: 2019-09-18 | Disposition: A | Discharge status: Home / Self Care | Payer: Medicare HMO | Source: Ambulatory Visit | Attending: Gastroenterology | Admitting: Gastroenterology

## 2019-09-18 ENCOUNTER — Other Ambulatory Visit: Payer: Self-pay

## 2019-09-18 DIAGNOSIS — Z20828 Contact with and (suspected) exposure to other viral communicable diseases: Secondary | ICD-10-CM | POA: Insufficient documentation

## 2019-09-19 LAB — SARS CORONAVIRUS 2 (TAT 6-24 HRS): SARS Coronavirus 2: NEGATIVE

## 2019-09-22 ENCOUNTER — Ambulatory Visit
Admission: RE | Admit: 2019-09-22 | Discharge: 2019-09-22 | Disposition: A | Discharge status: Home / Self Care | Payer: Medicare HMO | Attending: Gastroenterology | Admitting: Gastroenterology

## 2019-09-22 ENCOUNTER — Encounter: Payer: Self-pay | Admitting: *Deleted

## 2019-09-22 ENCOUNTER — Encounter
Admission: RE | Disposition: A | Discharge status: Home / Self Care | Payer: Self-pay | Source: Home / Self Care | Attending: Gastroenterology

## 2019-09-22 ENCOUNTER — Ambulatory Visit: Payer: Medicare HMO | Admitting: Certified Registered Nurse Anesthetist

## 2019-09-22 DIAGNOSIS — Z8261 Family history of arthritis: Secondary | ICD-10-CM | POA: Diagnosis not present

## 2019-09-22 DIAGNOSIS — I2510 Atherosclerotic heart disease of native coronary artery without angina pectoris: Secondary | ICD-10-CM | POA: Insufficient documentation

## 2019-09-22 DIAGNOSIS — I252 Old myocardial infarction: Secondary | ICD-10-CM | POA: Insufficient documentation

## 2019-09-22 DIAGNOSIS — E119 Type 2 diabetes mellitus without complications: Secondary | ICD-10-CM | POA: Insufficient documentation

## 2019-09-22 DIAGNOSIS — I447 Left bundle-branch block, unspecified: Secondary | ICD-10-CM | POA: Insufficient documentation

## 2019-09-22 DIAGNOSIS — Z8249 Family history of ischemic heart disease and other diseases of the circulatory system: Secondary | ICD-10-CM | POA: Insufficient documentation

## 2019-09-22 DIAGNOSIS — K5730 Diverticulosis of large intestine without perforation or abscess without bleeding: Secondary | ICD-10-CM | POA: Diagnosis not present

## 2019-09-22 DIAGNOSIS — R194 Change in bowel habit: Secondary | ICD-10-CM | POA: Diagnosis present

## 2019-09-22 DIAGNOSIS — E785 Hyperlipidemia, unspecified: Secondary | ICD-10-CM | POA: Diagnosis not present

## 2019-09-22 DIAGNOSIS — Z7982 Long term (current) use of aspirin: Secondary | ICD-10-CM | POA: Insufficient documentation

## 2019-09-22 DIAGNOSIS — Z87891 Personal history of nicotine dependence: Secondary | ICD-10-CM | POA: Insufficient documentation

## 2019-09-22 DIAGNOSIS — Z951 Presence of aortocoronary bypass graft: Secondary | ICD-10-CM | POA: Diagnosis not present

## 2019-09-22 DIAGNOSIS — Z7984 Long term (current) use of oral hypoglycemic drugs: Secondary | ICD-10-CM | POA: Insufficient documentation

## 2019-09-22 DIAGNOSIS — K5790 Diverticulosis of intestine, part unspecified, without perforation or abscess without bleeding: Secondary | ICD-10-CM | POA: Diagnosis not present

## 2019-09-22 DIAGNOSIS — Z1211 Encounter for screening for malignant neoplasm of colon: Secondary | ICD-10-CM

## 2019-09-22 DIAGNOSIS — I10 Essential (primary) hypertension: Secondary | ICD-10-CM | POA: Insufficient documentation

## 2019-09-22 DIAGNOSIS — Z96653 Presence of artificial knee joint, bilateral: Secondary | ICD-10-CM | POA: Diagnosis not present

## 2019-09-22 DIAGNOSIS — M1990 Unspecified osteoarthritis, unspecified site: Secondary | ICD-10-CM | POA: Diagnosis not present

## 2019-09-22 DIAGNOSIS — Z9849 Cataract extraction status, unspecified eye: Secondary | ICD-10-CM | POA: Insufficient documentation

## 2019-09-22 DIAGNOSIS — Z79899 Other long term (current) drug therapy: Secondary | ICD-10-CM | POA: Diagnosis not present

## 2019-09-22 HISTORY — PX: COLONOSCOPY WITH PROPOFOL: SHX5780

## 2019-09-22 SURGERY — COLONOSCOPY WITH PROPOFOL
Anesthesia: General

## 2019-09-22 MED ORDER — PROPOFOL 10 MG/ML IV BOLUS
INTRAVENOUS | Status: AC
  Filled 2019-09-22: qty 20

## 2019-09-22 MED ORDER — PROPOFOL 500 MG/50ML IV EMUL
INTRAVENOUS | Status: DC | PRN
  Administered 2019-09-22: 100 ug/kg/min via INTRAVENOUS

## 2019-09-22 MED ORDER — LIDOCAINE HCL (CARDIAC) PF 100 MG/5ML IV SOSY
PREFILLED_SYRINGE | INTRAVENOUS | Status: DC | PRN
  Administered 2019-09-22: 50 mg via INTRATRACHEAL

## 2019-09-22 MED ORDER — LIDOCAINE HCL (PF) 2 % IJ SOLN
INTRAMUSCULAR | Status: AC
  Filled 2019-09-22: qty 10

## 2019-09-22 MED ORDER — PROPOFOL 10 MG/ML IV BOLUS
INTRAVENOUS | Status: DC | PRN
  Administered 2019-09-22: 60 mg via INTRAVENOUS

## 2019-09-22 MED ORDER — SODIUM CHLORIDE 0.9 % IV SOLN
INTRAVENOUS | Status: DC
  Administered 2019-09-22: 1000 mL via INTRAVENOUS

## 2019-09-22 NOTE — Op Note (Signed)
[facility] Gastroenterology Patient Name: Michael Simon Procedure Date: 09/22/2019 11:18 AM MRN: [id number] Account #: [id number] Date of Birth: 12-12-1933 Admit Type: Outpatient Age: 85 Room: [facility] ENDO ROOM 4 Gender: Male Note Status: Finalized Procedure:            Colonoscopy Indications:          Change in bowel habits Providers:            [name] MD, MD Referring MD:         Javier Docker. [name], MD (Referring MD) Medicines:            Monitored Anesthesia Care Complications:        No immediate complications. Procedure:            Pre-Anesthesia Assessment:                       - Prior to the procedure, a History and Physical was                        performed, and patient medications, allergies and                        sensitivities were reviewed. The patient's tolerance of                        previous anesthesia was reviewed.                       - The risks and benefits of the procedure and the                        sedation options and risks were discussed with the                        patient. All questions were answered and informed                        consent was obtained.                       - ASA Grade Assessment: III - A patient with severe                        systemic disease.                       After obtaining informed consent, the colonoscope was                        passed under direct vision. Throughout the procedure,                        the patient's blood pressure, pulse, and oxygen                        saturations were monitored continuously. The                        Colonoscope was introduced through the anus and  advanced to the the cecum, identified by the                        appendiceal orifice. The colonoscopy was performed with                        moderate difficulty due to a tortuous colon. Successful                        completion of the procedure was aided by  applying                        abdominal pressure. The patient tolerated the procedure                        well. Findings:      The perianal and digital rectal examinations were normal.      Multiple small and large-mouthed diverticula were found in the entire       colon.      The exam was otherwise without abnormality on direct and retroflexion       views. Impression:           - Diverticulosis in the entire examined colon.                       - The examination was otherwise normal on direct and                        retroflexion views.                       - No specimens collected. Recommendation:       - Discharge patient to home (with escort).                       - Resume previous diet.                       - Return to my office PRN. Procedure Code(s):    --- Professional ---                       [phone], Colonoscopy, flexible; diagnostic, including                        collection of specimen(s) by brushing or washing, when                        performed (separate procedure) Diagnosis Code(s):    --- Professional ---                       R19.4, Change in bowel habit                       K57.30, Diverticulosis of large intestine without                        perforation or abscess without bleeding CPT copyright 2019 American Medical Association. All rights reserved. The codes documented in this report are preliminary and upon coder review may  be revised to meet current compliance requirements. [name], MD [name] MD,  MD 09/22/2019 12:02:20 PM This report has been signed electronically. Number of Addenda: 0 Note Initiated On: 09/22/2019 11:18 AM Scope Withdrawal Time: 0 hours 8 minutes 12 seconds  Total Procedure Duration: 0 hours 21 minutes 40 seconds  Estimated Blood Loss: Estimated blood loss: none.      [facility]

## 2019-09-22 NOTE — Anesthesia Post-op Follow-up Note (Signed)
Anesthesia QCDR form completed.        

## 2019-09-22 NOTE — Transfer of Care (Signed)
Immediate Anesthesia Transfer of Care Note  Patient: Michael Simon  Procedure(s) Performed: COLONOSCOPY WITH PROPOFOL (N/A )  Patient Location: PACU  Anesthesia Type:General  Level of Consciousness: awake and alert   Airway & Oxygen Therapy: Patient Spontanous Breathing and Patient connected to nasal cannula oxygen  Post-op Assessment: Report given to RN, Post -op Vital signs reviewed and stable and Patient moving all extremities X 4  Post vital signs: Reviewed and stable  Last Vitals:  Vitals Value Taken Time  BP 119/54 09/22/19 1206  Temp 36.1 C 09/22/19 1203  Pulse 66 09/22/19 1206  Resp 16 09/22/19 1206  SpO2 100 % 09/22/19 1206  Vitals shown include unvalidated device data.  Last Pain:  Vitals:   09/22/19 1203  TempSrc: Tympanic  PainSc:          Complications: No apparent anesthesia complications

## 2019-09-22 NOTE — Anesthesia Postprocedure Evaluation (Signed)
Anesthesia Post Note  Patient: Michael Simon  Procedure(s) Performed: COLONOSCOPY WITH PROPOFOL (N/A )  Patient location during evaluation: Endoscopy Anesthesia Type: General Level of consciousness: awake and alert Pain management: pain level controlled Vital Signs Assessment: post-procedure vital signs reviewed and stable Respiratory status: spontaneous breathing and respiratory function stable Cardiovascular status: stable Anesthetic complications: no     Last Vitals:  Vitals:   09/22/19 1213 09/22/19 1223  BP: 127/71 (!) 144/71  Pulse: 67 79  Resp: 16 15  Temp:    SpO2: 100% 100%    Last Pain:  Vitals:   09/22/19 1223  TempSrc:   PainSc: 0-No pain                 Denelle Capurro K

## 2019-09-22 NOTE — Anesthesia Preprocedure Evaluation (Signed)
Anesthesia Evaluation  Patient identified by MRN, date of birth, ID band Patient awake    Reviewed: Allergy & Precautions, NPO status , Patient's Chart, lab work & pertinent test results, reviewed documented beta blocker date and time   History of Anesthesia Complications Negative for: history of anesthetic complications  Airway Mallampati: III       Dental   Pulmonary neg sleep apnea, neg COPD, former smoker,           Cardiovascular hypertension, Pt. on medications and Pt. on home beta blockers + Past MI and + CABG  (-) CHF (-) dysrhythmias (-) Valvular Problems/Murmurs     Neuro/Psych neg Seizures    GI/Hepatic Neg liver ROS, neg GERD  ,  Endo/Other  diabetes, Type 2, Oral Hypoglycemic Agents  Renal/GU negative Renal ROS     Musculoskeletal   Abdominal   Peds  Hematology   Anesthesia Other Findings   Reproductive/Obstetrics                             Anesthesia Physical Anesthesia Plan  ASA: III  Anesthesia Plan: General   Post-op Pain Management:    Induction: Intravenous  PONV Risk Score and Plan: 2 and Propofol infusion and TIVA  Airway Management Planned: Nasal Cannula  Additional Equipment:   Intra-op Plan:   Post-operative Plan:   Informed Consent: I have reviewed the patients History and Physical, chart, labs and discussed the procedure including the risks, benefits and alternatives for the proposed anesthesia with the patient or authorized representative who has indicated his/her understanding and acceptance.       Plan Discussed with:   Anesthesia Plan Comments:         Anesthesia Quick Evaluation

## 2019-09-22 NOTE — H&P (Signed)
[name], MD [address] [place], [place], [place], [address] [address], [place], [place], [place], [address] Phone: [phone]  Fax: [phone]  Primary Care Physician:  Teodoro Spray, MD   Pre-Procedure History & Physical: HPI:  Michael Simon is a 85 y.o. male is here for an colonoscopy.   Past Medical History:  Diagnosis Date  . CAD (coronary artery disease)   . HLD (hyperlipidemia)   . HTN (hypertension)   . LBBB (left bundle branch block)   . Myocardial infarction ([place])   . Osteoarthritis   . Type II diabetes mellitus ([place])     Past Surgical History:  Procedure Laterality Date  . CARDIAC CATHETERIZATION    . CATARACT EXTRACTION    . COLONOSCOPY    . COLONOSCOPY WITH PROPOFOL N/A 08/26/2019   Procedure: COLONOSCOPY WITH PROPOFOL;  Surgeon: [name], MD;  Location[facility] ENDOSCOPY;  Service: Gastroenterology;  Laterality: N/A;  . CORONARY ARTERY BYPASS GRAFT  2012  . EYE SURGERY    . INGUINAL HERNIA REPAIR    . JOINT REPLACEMENT     knees    Prior to Admission medications   Medication Sig Start Date End Date Taking? Authorizing Provider  acetaminophen (TYLENOL) 325 MG tablet Take 2 tablets (650 mg total) by mouth every 6 (six) hours as needed for mild pain (or Fever >/= 101). 02/02/18  Yes Arrien, Jimmy Picket, MD  aspirin EC 81 MG tablet Take 81 mg by mouth daily.    Yes [provider]  Cyanocobalamin (RA VITAMIN B-12 TR) 1000 MCG TBCR Take 1,000 mcg by mouth daily.    Yes [provider]  Ferrous Sulfate (SLOW FE) 142 (45 Fe) MG TBCR Take 1 tablet by mouth daily.   Yes [provider]  iron polysaccharides (NIFEREX) 150 MG capsule Take 150 mg by mouth daily.   Yes [provider]  lisinopril (PRINIVIL,ZESTRIL) 30 MG tablet Take 30 mg by mouth daily.    Yes [provider]  lovastatin (MEVACOR) 40 MG tablet Take 40 mg by mouth at bedtime.   Yes [provider]  metoprolol tartrate  (LOPRESSOR) 25 MG tablet Take 12.5 mg by mouth 2 (two) times daily.   Yes [provider]  Multiple Vitamins-Minerals (CENTRUM SILVER) tablet Take 1 tablet by mouth daily. 02/11/18  Yes [provider]  pantoprazole (PROTONIX) 40 MG tablet Take 1 tablet (40 mg total) by mouth daily. Patient not taking: Reported on 09/22/2019 07/28/15   Glendon Axe, MD    Allergies as of 09/03/2019  . (No Known Allergies)    Family History  Problem Relation Age of Onset  . Arthritis Mother   . Heart disease Mother   . Heart disease Other     Social History   Socioeconomic History  . Marital status: Married    Spouse name: Not on file  . Number of children: Not on file  . Years of education: Not on file  . Highest education level: Not on file  Occupational History  . Not on file  Social Needs  . Financial resource strain: Not on file  . Food insecurity    Worry: Not on file    Inability: Not on file  . Transportation needs    Medical: Not on file    Non-medical: Not on file  Tobacco Use  . Smoking status: Former Smoker    Quit date: 03/03/2008    Years since quitting: 11.5  . Smokeless  tobacco: Never Used  Substance and Sexual Activity  . Alcohol use: No    Alcohol/week: 0.0 standard drinks  . Drug use: No  . Sexual activity: Not on file  Lifestyle  . Physical activity    Days per week: Not on file    Minutes per session: Not on file  . Stress: Not on file  Relationships  . Social Herbalist on phone: Not on file    Gets together: Not on file    Attends religious service: Not on file    Active member of club or organization: Not on file    Attends meetings of clubs or organizations: Not on file    Relationship status: Not on file  . Intimate partner violence    Fear of current or ex partner: Not on file    Emotionally abused: Not on file    Physically abused: Not on file    Forced sexual activity: Not on file  Other Topics Concern  . Not on file   Social History Narrative  . Not on file    Review of Systems: See HPI, otherwise negative ROS  Physical Exam: BP (!) 148/61   Pulse 75   Temp (!) 96.4 F (35.8 C) (Tympanic)   Resp 18   Ht 5\' 11"  (1.803 m)   Wt 72.6 kg   SpO2 99%   BMI 22.32 kg/m  General:   Alert,  pleasant and cooperative in NAD Head:  Normocephalic and atraumatic. Neck:  Supple; no masses or thyromegaly. Lungs:  Clear throughout to auscultation, normal respiratory effort.    Heart:  +S1, +S2, Regular rate and rhythm, No edema. Abdomen:  Soft, nontender and nondistended. Normal bowel sounds, without guarding, and without rebound.   Neurologic:  Alert and  oriented x4;  grossly normal neurologically.  Impression/Plan: Michael Simon is here for an colonoscopy to be performed for change in bowel habits. Risks, benefits, limitations, and alternatives regarding  colonoscopy have been reviewed with the patient.  Questions have been answered.  All parties agreeable.   [name], MD  09/22/2019, 11:13 AM

## 2019-09-23 ENCOUNTER — Encounter: Payer: Self-pay | Admitting: Gastroenterology

## 2019-09-26 DIAGNOSIS — I10 Essential (primary) hypertension: Secondary | ICD-10-CM | POA: Diagnosis not present

## 2019-09-26 DIAGNOSIS — E119 Type 2 diabetes mellitus without complications: Secondary | ICD-10-CM | POA: Diagnosis not present

## 2019-10-02 DIAGNOSIS — R7989 Other specified abnormal findings of blood chemistry: Secondary | ICD-10-CM | POA: Diagnosis not present

## 2019-10-02 DIAGNOSIS — E119 Type 2 diabetes mellitus without complications: Secondary | ICD-10-CM | POA: Diagnosis not present

## 2019-10-02 DIAGNOSIS — I10 Essential (primary) hypertension: Secondary | ICD-10-CM | POA: Diagnosis not present

## 2019-10-02 DIAGNOSIS — D649 Anemia, unspecified: Secondary | ICD-10-CM | POA: Diagnosis not present

## 2019-11-04 DIAGNOSIS — R7989 Other specified abnormal findings of blood chemistry: Secondary | ICD-10-CM | POA: Diagnosis not present

## 2019-11-04 DIAGNOSIS — D649 Anemia, unspecified: Secondary | ICD-10-CM | POA: Diagnosis not present

## 2019-11-04 DIAGNOSIS — I10 Essential (primary) hypertension: Secondary | ICD-10-CM | POA: Diagnosis not present

## 2019-11-04 DIAGNOSIS — E119 Type 2 diabetes mellitus without complications: Secondary | ICD-10-CM | POA: Diagnosis not present

## 2019-11-04 DIAGNOSIS — Z0000 Encounter for general adult medical examination without abnormal findings: Secondary | ICD-10-CM | POA: Diagnosis not present

## 2020-02-09 DIAGNOSIS — I10 Essential (primary) hypertension: Secondary | ICD-10-CM | POA: Diagnosis not present

## 2020-02-09 DIAGNOSIS — R7989 Other specified abnormal findings of blood chemistry: Secondary | ICD-10-CM | POA: Diagnosis not present

## 2020-02-09 DIAGNOSIS — E119 Type 2 diabetes mellitus without complications: Secondary | ICD-10-CM | POA: Diagnosis not present

## 2020-02-09 DIAGNOSIS — N182 Chronic kidney disease, stage 2 (mild): Secondary | ICD-10-CM | POA: Diagnosis not present

## 2020-04-23 ENCOUNTER — Encounter: Payer: Self-pay | Admitting: Internal Medicine

## 2020-04-23 ENCOUNTER — Ambulatory Visit (INDEPENDENT_AMBULATORY_CARE_PROVIDER_SITE_OTHER): Payer: Medicare HMO | Admitting: Internal Medicine

## 2020-04-23 ENCOUNTER — Other Ambulatory Visit: Payer: Self-pay

## 2020-04-23 VITALS — BP 139/81 | HR 84

## 2020-04-23 DIAGNOSIS — L03031 Cellulitis of right toe: Secondary | ICD-10-CM | POA: Diagnosis not present

## 2020-04-23 MED ORDER — CEPHALEXIN 500 MG PO CAPS: 500.0000 mg | ORAL_CAPSULE | Freq: Four times a day (QID) | ORAL | 0 refills | Status: DC

## 2020-04-23 NOTE — Assessment & Plan Note (Signed)
Pt has paronychia of rt big toe , ref to podiatry

## 2020-04-23 NOTE — Progress Notes (Signed)
Established Patient Office Visit  Subjective:  Patient ID: Michael Simon, male    DOB: 03/30/1933  Age: 86 y.o. MRN: [id number]  CC:  Chief Complaint  Patient presents with  . Toe Pain    toe pain, has redness and feels tender    HPI  Michael Simon presents for pain in rt big toe  Past Medical History:  Diagnosis Date  . CAD (coronary artery disease)   . HLD (hyperlipidemia)   . HTN (hypertension)   . LBBB (left bundle branch block)   . Myocardial infarction ([place])   . Osteoarthritis   . Type II diabetes mellitus ([place])     Past Surgical History:  Procedure Laterality Date  . CARDIAC CATHETERIZATION    . CATARACT EXTRACTION    . COLONOSCOPY    . COLONOSCOPY WITH PROPOFOL N/A 08/26/2019   Procedure: COLONOSCOPY WITH PROPOFOL;  Surgeon: [name], MD;  Location: [facility] ENDOSCOPY;  Service: Gastroenterology;  Laterality: N/A;  . COLONOSCOPY WITH PROPOFOL N/A 09/22/2019   Procedure: COLONOSCOPY WITH PROPOFOL;  Surgeon: [name], MD;  Location: [facility] ENDOSCOPY;  Service: Gastroenterology;  Laterality: N/A;  pediatric colonoscope needed  . CORONARY ARTERY BYPASS GRAFT  2012  . EYE SURGERY    . INGUINAL HERNIA REPAIR    . JOINT REPLACEMENT     knees    Family History  Problem Relation Age of Onset  . Arthritis Mother   . Heart disease Mother   . Heart disease Other     Social History   Socioeconomic History  . Marital status: Married    Spouse name: Not on file  . Number of children: Not on file  . Years of education: Not on file  . Highest education level: Not on file  Occupational History  . Not on file  Tobacco Use  . Smoking status: Former Smoker    Quit date: 03/03/2008    Years since quitting: 12.1  . Smokeless tobacco: Never Used  Substance and Sexual Activity  . Alcohol use: No    Alcohol/week: 0.0 standard drinks  . Drug use: No  . Sexual activity: Not on file  Other Topics Concern  . Not on file  Social History Narrative  . Not on  file   Social Determinants of Health   Financial Resource Strain:   . Difficulty of Paying Living Expenses:   Food Insecurity:   . Worried About Charity fundraiser in the Last Year:   . Arboriculturist in the Last Year:   Transportation Needs:   . Film/video editor (Medical):   Marland Kitchen Lack of Transportation (Non-Medical):   Physical Activity:   . Days of Exercise per Week:   . Minutes of Exercise per Session:   Stress:   . Feeling of Stress :   Social Connections:   . Frequency of Communication with Friends and Family:   . Frequency of Social Gatherings with Friends and Family:   . Attends Religious Services:   . Active Member of Clubs or Organizations:   . Attends Archivist Meetings:   Marland Kitchen Marital Status:   Intimate Partner Violence:   . Fear of Current or Ex-Partner:   . Emotionally Abused:   Marland Kitchen Physically Abused:   . Sexually Abused:      Current Outpatient Medications:  .  acetaminophen (TYLENOL) 325 MG tablet, Take 2 tablets (650 mg total) by mouth every 6 (six) hours as needed for mild pain (or Fever >/=  101)., Disp: 20 tablet, Rfl: 0 .  aspirin EC 81 MG tablet, Take 81 mg by mouth daily. , Disp: , Rfl:  .  cephALEXin (KEFLEX) 500 MG capsule, Take 1 capsule (500 mg total) by mouth 4 (four) times daily., Disp: 28 capsule, Rfl: 0 .  Cyanocobalamin (RA VITAMIN B-12 TR) 1000 MCG TBCR, Take 1,000 mcg by mouth daily. , Disp: , Rfl:  .  Ferrous Sulfate (SLOW FE) 142 (45 Fe) MG TBCR, Take 1 tablet by mouth daily., Disp: , Rfl:  .  iron polysaccharides (NIFEREX) 150 MG capsule, Take 150 mg by mouth daily., Disp: , Rfl:  .  lisinopril (PRINIVIL,ZESTRIL) 30 MG tablet, Take 30 mg by mouth daily. , Disp: , Rfl:  .  lovastatin (MEVACOR) 40 MG tablet, Take 40 mg by mouth at bedtime., Disp: , Rfl:  .  metoprolol tartrate (LOPRESSOR) 25 MG tablet, Take 12.5 mg by mouth 2 (two) times daily., Disp: , Rfl:  .  Multiple Vitamins-Minerals (CENTRUM SILVER) tablet, Take 1 tablet by  mouth daily., Disp: , Rfl:  .  pantoprazole (PROTONIX) 40 MG tablet, Take 1 tablet (40 mg total) by mouth daily. (Patient not taking: Reported on 09/22/2019), Disp: 90 tablet, Rfl: 1 .  TRESIBA FLEXTOUCH 100 UNIT/ML FlexTouch Pen, Inject 16 Units into the skin as needed. When blood sugar is over 150, Disp: , Rfl:    No Known Allergies  ROS Review of Systems  Constitutional: Negative for activity change.  HENT: Positive for hearing loss. Negative for congestion and sinus pressure.   Eyes: Negative for pain.  Respiratory: Negative for chest tightness.   Cardiovascular: Negative for leg swelling.  Gastrointestinal: Negative for abdominal pain.  Genitourinary: Negative for flank pain.  Musculoskeletal: Negative for back pain.  Skin:       Pt has erythema  Base  Of rt big toe  Also has hammer toe lt foot   Neurological: Positive for dizziness.  Psychiatric/Behavioral: Negative for behavioral problems.      Objective:    Physical Exam  Constitutional: He appears well-developed.  HENT:  Head: Normocephalic.  Eyes: Pupils are equal, round, and reactive to light.  Neck: No JVD present. No thyromegaly present.  Cardiovascular: Normal heart sounds.  Pulses:      Dorsalis pedis pulses are 1+ on the right side and 1+ on the left side.  Pulmonary/Chest: He has no wheezes. He has rales.  Abdominal: Soft. He exhibits distension. There is no abdominal tenderness.  Musculoskeletal:        General: Tenderness present.     Cervical back: Normal range of motion.     Comments: Rt big toe  Lymphadenopathy:    He has no cervical adenopathy.  Neurological: No cranial nerve deficit.    BP 139/81   Pulse 84  Wt Readings from Last 3 Encounters:  09/22/19 160 lb (72.6 kg)  08/26/19 140 lb (63.5 kg)  05/30/18 160 lb 3.2 oz (72.7 kg)     Health Maintenance Due  Topic Date Due  . FOOT EXAM  Never done  . OPHTHALMOLOGY EXAM  Never done  . COVID-19 Vaccine (1) Never done  . TETANUS/TDAP   Never done  . PNA vac Low Risk Adult (2 of 2 - PCV13) 03/10/2012  . HEMOGLOBIN A1C  11/30/2018    There are no preventive care reminders to display for this patient.  Lab Results  Component Value Date   TSH 3.470 02/02/2018   Lab Results  Component Value Date  WBC 5.7 06/02/2018   HGB 10.9 (L) 06/02/2018   HCT 32.0 (L) 06/02/2018   MCV 90.7 06/02/2018   PLT 139 (L) 06/02/2018   Lab Results  Component Value Date   NA 138 06/02/2018   K 3.1 (L) 06/02/2018   CO2 26 06/02/2018   GLUCOSE 118 (H) 06/02/2018   BUN 14 06/02/2018   CREATININE 1.17 06/02/2018   BILITOT 0.8 05/31/2018   ALKPHOS 51 05/31/2018   AST 31 05/31/2018   ALT 16 (L) 05/31/2018   PROT 6.1 (L) 05/31/2018   ALBUMIN 3.4 (L) 05/31/2018   CALCIUM 8.2 (L) 06/02/2018   ANIONGAP 8 06/02/2018   No results found for: CHOL No results found for: HDL No results found for: LDLCALC No results found for: TRIG No results found for: CHOLHDL Lab Results  Component Value Date   HGBA1C 6.5 (H) 05/31/2018      Assessment & Plan:   Problem List Items Addressed This Visit      Musculoskeletal and Integument   Paronychia of great toe, right - Primary    Pt has paronychia of rt big toe , ref to podiatry      Relevant Medications   cephALEXin (KEFLEX) 500 MG capsule   Other Relevant Orders   Ambulatory referral to Podiatry      Meds ordered this encounter  Medications  . cephALEXin (KEFLEX) 500 MG capsule    Sig: Take 1 capsule (500 mg total) by mouth 4 (four) times daily.    Dispense:  28 capsule    Refill:  0    Follow-up: Return in about 10 days (around 05/03/2020).    [name], MD

## 2020-05-03 ENCOUNTER — Ambulatory Visit (INDEPENDENT_AMBULATORY_CARE_PROVIDER_SITE_OTHER): Payer: Medicare HMO | Admitting: Internal Medicine

## 2020-05-03 ENCOUNTER — Other Ambulatory Visit: Payer: Self-pay

## 2020-05-03 VITALS — BP 147/78 | Wt 174.3 lb

## 2020-05-03 DIAGNOSIS — I10 Essential (primary) hypertension: Secondary | ICD-10-CM

## 2020-05-03 DIAGNOSIS — Z794 Long term (current) use of insulin: Secondary | ICD-10-CM | POA: Diagnosis not present

## 2020-05-03 DIAGNOSIS — E1159 Type 2 diabetes mellitus with other circulatory complications: Secondary | ICD-10-CM | POA: Diagnosis not present

## 2020-05-03 DIAGNOSIS — L03031 Cellulitis of right toe: Secondary | ICD-10-CM | POA: Diagnosis not present

## 2020-05-03 NOTE — Assessment & Plan Note (Signed)
stable °

## 2020-05-03 NOTE — Progress Notes (Signed)
Established Patient Office Visit  Subjective:  Patient ID: Michael Simon, male    DOB: May 31, 1933  Age: 86 y.o. MRN: [id number]  CC: Chief Complaint  Patient presents with  . Toe Pain    follow up for toe pain     HPI  [name] presents for for a checkup.  He says he is feeling better.  Past Medical History:  Diagnosis Date  . CAD (coronary artery disease)   . HLD (hyperlipidemia)   . HTN (hypertension)   . LBBB (left bundle branch block)   . Myocardial infarction ([place])   . Osteoarthritis   . Type II diabetes mellitus ([place])     Past Surgical History:  Procedure Laterality Date  . CARDIAC CATHETERIZATION    . CATARACT EXTRACTION    . COLONOSCOPY    . COLONOSCOPY WITH PROPOFOL N/A 08/26/2019   Procedure: COLONOSCOPY WITH PROPOFOL;  Surgeon: [name], MD;  Location: [facility] ENDOSCOPY;  Service: Gastroenterology;  Laterality: N/A;  . COLONOSCOPY WITH PROPOFOL N/A 09/22/2019   Procedure: COLONOSCOPY WITH PROPOFOL;  Surgeon: [name], MD;  Location: [facility] ENDOSCOPY;  Service: Gastroenterology;  Laterality: N/A;  pediatric colonoscope needed  . CORONARY ARTERY BYPASS GRAFT  2012  . EYE SURGERY    . INGUINAL HERNIA REPAIR    . JOINT REPLACEMENT     knees    Family History  Problem Relation Age of Onset  . Arthritis Mother   . Heart disease Mother   . Heart disease Other     Social History   Socioeconomic History  . Marital status: Married    Spouse name: Not on file  . Number of children: Not on file  . Years of education: Not on file  . Highest education level: Not on file  Occupational History  . Not on file  Tobacco Use  . Smoking status: Former Smoker    Quit date: 03/03/2008    Years since quitting: 12.1  . Smokeless tobacco: Never Used  Substance and Sexual Activity  . Alcohol use: No    Alcohol/week: 0.0 standard drinks  . Drug use: No  . Sexual activity: Not on file  Other Topics Concern  . Not on file  Social History Narrative   . Not on file   Social Determinants of Health   Financial Resource Strain:   . Difficulty of Paying Living Expenses:   Food Insecurity:   . Worried About Charity fundraiser in the Last Year:   . Arboriculturist in the Last Year:   Transportation Needs:   . Film/video editor (Medical):   Marland Kitchen Lack of Transportation (Non-Medical):   Physical Activity:   . Days of Exercise per Week:   . Minutes of Exercise per Session:   Stress:   . Feeling of Stress :   Social Connections:   . Frequency of Communication with Friends and Family:   . Frequency of Social Gatherings with Friends and Family:   . Attends Religious Services:   . Active Member of Clubs or Organizations:   . Attends Archivist Meetings:   Marland Kitchen Marital Status:   Intimate Partner Violence:   . Fear of Current or Ex-Partner:   . Emotionally Abused:   Marland Kitchen Physically Abused:   . Sexually Abused:      Current Outpatient Medications:  .  acetaminophen (TYLENOL) 325 MG tablet, Take 2 tablets (650 mg total) by mouth every 6 (six) hours as needed for mild pain (  or Fever >/= 101)., Disp: 20 tablet, Rfl: 0 .  aspirin EC 81 MG tablet, Take 81 mg by mouth daily. , Disp: , Rfl:  .  cephALEXin (KEFLEX) 500 MG capsule, Take 1 capsule (500 mg total) by mouth 4 (four) times daily., Disp: 28 capsule, Rfl: 0 .  Cyanocobalamin (RA VITAMIN B-12 TR) 1000 MCG TBCR, Take 1,000 mcg by mouth daily. , Disp: , Rfl:  .  Ferrous Sulfate (SLOW FE) 142 (45 Fe) MG TBCR, Take 1 tablet by mouth daily., Disp: , Rfl:  .  iron polysaccharides (NIFEREX) 150 MG capsule, Take 150 mg by mouth daily., Disp: , Rfl:  .  lisinopril (PRINIVIL,ZESTRIL) 30 MG tablet, Take 30 mg by mouth daily. , Disp: , Rfl:  .  lovastatin (MEVACOR) 40 MG tablet, Take 40 mg by mouth at bedtime., Disp: , Rfl:  .  metoprolol tartrate (LOPRESSOR) 25 MG tablet, Take 12.5 mg by mouth 2 (two) times daily., Disp: , Rfl:  .  Multiple Vitamins-Minerals (CENTRUM SILVER) tablet, Take 1  tablet by mouth daily., Disp: , Rfl:  .  pantoprazole (PROTONIX) 40 MG tablet, Take 1 tablet (40 mg total) by mouth daily., Disp: 90 tablet, Rfl: 1 .  TRESIBA FLEXTOUCH 100 UNIT/ML FlexTouch Pen, Inject 16 Units into the skin as needed. When blood sugar is over 150, Disp: , Rfl:    No Known Allergies  ROS Review of Systems  Musculoskeletal:       Rt big toe is better  All other systems reviewed and are negative.     Objective:    Physical Exam  Constitutional: He is oriented to person, place, and time. He appears well-developed and well-nourished.  HENT:  Head: Normocephalic.  Cardiovascular: Normal rate.  Abdominal: Soft.  Musculoskeletal:        General: Normal range of motion.     Cervical back: Normal range of motion.  Neurological: He is alert and oriented to person, place, and time.  Skin: There is erythema (swelling of rt foot decreased).    BP (!) 147/78 (BP Location: Left Arm, Patient Position: Sitting, Cuff Size: Normal)   Wt 174 lb 4.8 oz (79.1 kg)   BMI 24.31 kg/m  Wt Readings from Last 3 Encounters:  05/03/20 174 lb 4.8 oz (79.1 kg)  09/22/19 160 lb (72.6 kg)  08/26/19 140 lb (63.5 kg)     Health Maintenance Due  Topic Date Due  . FOOT EXAM  Never done  . OPHTHALMOLOGY EXAM  Never done  . COVID-19 Vaccine (1) Never done  . TETANUS/TDAP  Never done  . PNA vac Low Risk Adult (2 of 2 - PCV13) 03/10/2012  . HEMOGLOBIN A1C  11/30/2018    There are no preventive care reminders to display for this patient.  Lab Results  Component Value Date   TSH 3.470 02/02/2018   Lab Results  Component Value Date   WBC 5.7 06/02/2018   HGB 10.9 (L) 06/02/2018   HCT 32.0 (L) 06/02/2018   MCV 90.7 06/02/2018   PLT 139 (L) 06/02/2018   Lab Results  Component Value Date   NA 138 06/02/2018   K 3.1 (L) 06/02/2018   CO2 26 06/02/2018   GLUCOSE 118 (H) 06/02/2018   BUN 14 06/02/2018   CREATININE 1.17 06/02/2018   BILITOT 0.8 05/31/2018   ALKPHOS 51 05/31/2018     AST 31 05/31/2018   ALT 16 (L) 05/31/2018   PROT 6.1 (L) 05/31/2018   ALBUMIN 3.4 (L) 05/31/2018  CALCIUM 8.2 (L) 06/02/2018   ANIONGAP 8 06/02/2018   No results found for: CHOL No results found for: HDL No results found for: LDLCALC No results found for: TRIG No results found for: CHOLHDL Lab Results  Component Value Date   HGBA1C 6.5 (H) 05/31/2018      Assessment & Plan:   Problem List Items Addressed This Visit      Cardiovascular and Mediastinum   HTN (hypertension) - Primary    stable        Endocrine   Type 2 diabetes mellitus (HCC)     Musculoskeletal and Integument   Paronychia of great toe, right    1. Essential hypertension stable  2. Paronychia of great toe, right stable  3. Type 2 diabetes mellitus with other circulatory complication, with long-term current use of insulin ([place]) stable  Follow-up: Return in about 4 weeks (around 05/31/2020).    [name], MD

## 2020-05-10 ENCOUNTER — Other Ambulatory Visit: Payer: Self-pay | Admitting: *Deleted

## 2020-05-10 MED ORDER — LISINOPRIL 30 MG PO TABS: 30.0000 mg | ORAL_TABLET | Freq: Every day | ORAL | 3 refills | Status: DC

## 2020-06-15 ENCOUNTER — Other Ambulatory Visit: Payer: Self-pay | Admitting: Internal Medicine

## 2020-08-10 ENCOUNTER — Other Ambulatory Visit: Payer: Self-pay | Admitting: Internal Medicine

## 2020-08-16 ENCOUNTER — Other Ambulatory Visit: Payer: Self-pay | Admitting: *Deleted

## 2020-08-16 MED ORDER — LOVASTATIN 40 MG PO TABS: 40.0000 mg | ORAL_TABLET | Freq: Every day | ORAL | 6 refills | Status: DC

## 2020-08-17 DIAGNOSIS — R05 Cough: Secondary | ICD-10-CM | POA: Diagnosis not present

## 2020-08-17 DIAGNOSIS — Z03818 Encounter for observation for suspected exposure to other biological agents ruled out: Secondary | ICD-10-CM | POA: Diagnosis not present

## 2020-08-17 DIAGNOSIS — Z20822 Contact with and (suspected) exposure to covid-19: Secondary | ICD-10-CM | POA: Diagnosis not present

## 2020-12-03 ENCOUNTER — Ambulatory Visit (INDEPENDENT_AMBULATORY_CARE_PROVIDER_SITE_OTHER): Payer: Medicare HMO

## 2020-12-03 ENCOUNTER — Other Ambulatory Visit: Payer: Self-pay

## 2020-12-03 DIAGNOSIS — Z794 Long term (current) use of insulin: Secondary | ICD-10-CM

## 2020-12-03 DIAGNOSIS — I10 Essential (primary) hypertension: Secondary | ICD-10-CM

## 2020-12-03 DIAGNOSIS — I2510 Atherosclerotic heart disease of native coronary artery without angina pectoris: Secondary | ICD-10-CM

## 2020-12-03 DIAGNOSIS — E1159 Type 2 diabetes mellitus with other circulatory complications: Secondary | ICD-10-CM

## 2020-12-04 LAB — CBC WITH DIFFERENTIAL/PLATELET
Absolute Monocytes: 774 cells/uL (ref 200–950)
Basophils Absolute: 85 cells/uL (ref 0–200)
Basophils Relative: 0.8 %
Eosinophils Absolute: 297 cells/uL (ref 15–500)
Eosinophils Relative: 2.8 %
HCT: 39.5 % (ref 38.5–50.0)
Hemoglobin: 13.1 g/dL — ABNORMAL LOW (ref 13.2–17.1)
Lymphs Abs: 2173 cells/uL (ref 850–3900)
MCH: 31.3 pg (ref 27.0–33.0)
MCHC: 33.2 g/dL (ref 32.0–36.0)
MCV: 94.3 fL (ref 80.0–100.0)
MPV: 11.7 fL (ref 7.5–12.5)
Monocytes Relative: 7.3 %
Neutro Abs: 7272 cells/uL (ref 1500–7800)
Neutrophils Relative %: 68.6 %
Platelets: 201 10*3/uL (ref 140–400)
RBC: 4.19 10*6/uL — ABNORMAL LOW (ref 4.20–5.80)
RDW: 11.9 % (ref 11.0–15.0)
Total Lymphocyte: 20.5 %
WBC: 10.6 10*3/uL (ref 3.8–10.8)

## 2020-12-04 LAB — COMPLETE METABOLIC PANEL WITH GFR
AG Ratio: 1.5 (calc) (ref 1.0–2.5)
ALT: 11 U/L (ref 9–46)
AST: 15 U/L (ref 10–35)
Albumin: 4.6 g/dL (ref 3.6–5.1)
Alkaline phosphatase (APISO): 71 U/L (ref 35–144)
BUN/Creatinine Ratio: 18 (calc) (ref 6–22)
BUN: 24 mg/dL (ref 7–25)
CO2: 25 mmol/L (ref 20–32)
Calcium: 9.4 mg/dL (ref 8.6–10.3)
Chloride: 103 mmol/L (ref 98–110)
Creat: 1.34 mg/dL — ABNORMAL HIGH (ref 0.70–1.11)
GFR, Est African American: 56 mL/min/{1.73_m2} — ABNORMAL LOW (ref >=60)
GFR, Est Non African American: 48 mL/min/{1.73_m2} — ABNORMAL LOW (ref >=60)
Globulin: 3 g/dL (calc) (ref 1.9–3.7)
Glucose, Bld: 105 mg/dL — ABNORMAL HIGH (ref 65–99)
Potassium: 4.5 mmol/L (ref 3.5–5.3)
Sodium: 140 mmol/L (ref 135–146)
Total Bilirubin: 0.9 mg/dL (ref 0.2–1.2)
Total Protein: 7.6 g/dL (ref 6.1–8.1)

## 2020-12-04 LAB — HEMOGLOBIN A1C
Hgb A1c MFr Bld: 8.7 % of total Hgb — ABNORMAL HIGH (ref <5.7)
Mean Plasma Glucose: 203 mg/dL
eAG (mmol/L): 11.2 mmol/L

## 2020-12-04 LAB — TSH: TSH: 3.18 mIU/L (ref 0.40–4.50)

## 2020-12-04 LAB — PSA: PSA: 1.74 ng/mL (ref <=4.0)

## 2020-12-07 ENCOUNTER — Other Ambulatory Visit: Payer: Self-pay

## 2020-12-07 ENCOUNTER — Ambulatory Visit (INDEPENDENT_AMBULATORY_CARE_PROVIDER_SITE_OTHER): Payer: Medicare HMO | Admitting: Internal Medicine

## 2020-12-07 ENCOUNTER — Encounter: Payer: Self-pay | Admitting: Internal Medicine

## 2020-12-07 VITALS — BP 137/83 | HR 90 | Ht 69.0 in | Wt 171.7 lb

## 2020-12-07 DIAGNOSIS — E783 Hyperchylomicronemia: Secondary | ICD-10-CM | POA: Diagnosis not present

## 2020-12-07 DIAGNOSIS — I10 Essential (primary) hypertension: Secondary | ICD-10-CM

## 2020-12-07 DIAGNOSIS — Z794 Long term (current) use of insulin: Secondary | ICD-10-CM

## 2020-12-07 DIAGNOSIS — Z0000 Encounter for general adult medical examination without abnormal findings: Secondary | ICD-10-CM

## 2020-12-07 DIAGNOSIS — E1159 Type 2 diabetes mellitus with other circulatory complications: Secondary | ICD-10-CM | POA: Diagnosis not present

## 2020-12-07 NOTE — Progress Notes (Signed)
Established Patient Office Visit  Subjective:  Patient ID: Michael Simon, male    DOB: 16-Aug-1933  Age: 86 y.o. MRN: [id number]  CC:  Chief Complaint  Patient presents with  . Annual Exam    HPI  CLENTON ESPER presents for  Check up   . H/o fall on rt knee , h/o rt knee replacement  Past Medical History:  Diagnosis Date  . CAD (coronary artery disease)   . HLD (hyperlipidemia)   . HTN (hypertension)   . LBBB (left bundle branch block)   . Myocardial infarction ([place])   . Osteoarthritis   . Type II diabetes mellitus ([place])     Past Surgical History:  Procedure Laterality Date  . CARDIAC CATHETERIZATION    . CATARACT EXTRACTION    . COLONOSCOPY    . COLONOSCOPY WITH PROPOFOL N/A 08/26/2019   Procedure: COLONOSCOPY WITH PROPOFOL;  Surgeon: [name], MD;  Location: [facility] ENDOSCOPY;  Service: Gastroenterology;  Laterality: N/A;  . COLONOSCOPY WITH PROPOFOL N/A 09/22/2019   Procedure: COLONOSCOPY WITH PROPOFOL;  Surgeon: [name], MD;  Location: [facility] ENDOSCOPY;  Service: Gastroenterology;  Laterality: N/A;  pediatric colonoscope needed  . CORONARY ARTERY BYPASS GRAFT  2012  . EYE SURGERY    . INGUINAL HERNIA REPAIR    . JOINT REPLACEMENT     knees    Family History  Problem Relation Age of Onset  . Arthritis Mother   . Heart disease Mother   . Heart disease Other     Social History   Socioeconomic History  . Marital status: Married    Spouse name: Not on file  . Number of children: Not on file  . Years of education: Not on file  . Highest education level: Not on file  Occupational History  . Not on file  Tobacco Use  . Smoking status: Former Smoker    Quit date: 03/03/2008    Years since quitting: 12.7  . Smokeless tobacco: Never Used  Vaping Use  . Vaping Use: Never used  Substance and Sexual Activity  . Alcohol use: No    Alcohol/week: 0.0 standard drinks  . Drug use: No  . Sexual activity: Not on file  Other Topics Concern  . Not on  file  Social History Narrative  . Not on file   Social Determinants of Health   Financial Resource Strain: Not on file  Food Insecurity: Not on file  Transportation Needs: Not on file  Physical Activity: Not on file  Stress: Not on file  Social Connections: Not on file  Intimate Partner Violence: Not on file     Current Outpatient Medications:  .  acetaminophen (TYLENOL) 325 MG tablet, Take 2 tablets (650 mg total) by mouth every 6 (six) hours as needed for mild pain (or Fever >/= 101)., Disp: 20 tablet, Rfl: 0 .  aspirin EC 81 MG tablet, Take 81 mg by mouth daily. , Disp: , Rfl:  .  cephALEXin (KEFLEX) 500 MG capsule, Take 1 capsule (500 mg total) by mouth 4 (four) times daily., Disp: 28 capsule, Rfl: 0 .  Cyanocobalamin (RA VITAMIN B-12 TR) 1000 MCG TBCR, Take 1,000 mcg by mouth daily. , Disp: , Rfl:  .  FERREX 150 150 MG capsule, TAKE 1 CAPSULE BY MOUTH EVERY DAY, Disp: 90 capsule, Rfl: 3 .  Ferrous Sulfate (SLOW FE) 142 (45 Fe) MG TBCR, Take 1 tablet by mouth daily., Disp: , Rfl:  .  lisinopril (ZESTRIL) 30 MG tablet, Take  1 tablet (30 mg total) by mouth daily., Disp: 90 tablet, Rfl: 3 .  lovastatin (MEVACOR) 40 MG tablet, Take 1 tablet (40 mg total) by mouth at bedtime., Disp: 30 tablet, Rfl: 6 .  metoprolol tartrate (LOPRESSOR) 25 MG tablet, Take 12.5 mg by mouth 2 (two) times daily., Disp: , Rfl:  .  Multiple Vitamins-Minerals (CENTRUM SILVER) tablet, Take 1 tablet by mouth daily., Disp: , Rfl:  .  pantoprazole (PROTONIX) 40 MG tablet, TAKE 1 TABLET BY MOUTH EVERY DAY, Disp: 90 tablet, Rfl: 3 .  TRESIBA FLEXTOUCH 100 UNIT/ML FlexTouch Pen, Inject 16 Units into the skin as needed. When blood sugar is over 150, Disp: , Rfl:    No Known Allergies  ROS Review of Systems  Constitutional: Negative.   HENT: Negative.   Eyes: Negative.   Respiratory: Negative.   Cardiovascular: Negative.   Gastrointestinal: Negative.   Endocrine: Negative.   Genitourinary: Negative.    Musculoskeletal: Negative.   Skin: Negative.   Allergic/Immunologic: Negative.   Neurological: Negative.   Hematological: Negative.   Psychiatric/Behavioral: Negative.   All other systems reviewed and are negative.     Objective:    Physical Exam Vitals reviewed.  Constitutional:      Appearance: Normal appearance.  HENT:     Mouth/Throat:     Mouth: Mucous membranes are moist.  Eyes:     Pupils: Pupils are equal, round, and reactive to light.  Neck:     Vascular: No carotid bruit.  Cardiovascular:     Rate and Rhythm: Normal rate and regular rhythm.     Pulses: Normal pulses.     Heart sounds: Normal heart sounds.  Pulmonary:     Effort: Pulmonary effort is normal.     Breath sounds: Normal breath sounds.  Abdominal:     General: Bowel sounds are normal.     Palpations: Abdomen is soft. There is no hepatomegaly, splenomegaly or mass.     Tenderness: There is no abdominal tenderness.     Hernia: No hernia is present.  Musculoskeletal:     Cervical back: Neck supple.     Right lower leg: No edema.     Left lower leg: No edema.  Skin:    Findings: No rash.  Neurological:     Mental Status: He is alert and oriented to person, place, and time.     Motor: No weakness.  Psychiatric:        Mood and Affect: Mood normal.        Behavior: Behavior normal.     BP 137/83   Pulse 90   Ht 5\' 9"  (1.753 m)   Wt 171 lb 11.2 oz (77.9 kg)   BMI 25.36 kg/m  Wt Readings from Last 3 Encounters:  12/07/20 171 lb 11.2 oz (77.9 kg)  05/03/20 174 lb 4.8 oz (79.1 kg)  09/22/19 160 lb (72.6 kg)     Health Maintenance Due  Topic Date Due  . FOOT EXAM  Never done  . OPHTHALMOLOGY EXAM  Never done  . COVID-19 Vaccine (1) Never done  . TETANUS/TDAP  Never done  . PNA vac Low Risk Adult (2 of 2 - PCV13) 03/10/2012  . INFLUENZA VACCINE  07/18/2020    There are no preventive care reminders to display for this patient.  Lab Results  Component Value Date   TSH 3.18  12/03/2020   Lab Results  Component Value Date   WBC 10.6 12/03/2020   HGB 13.1 (L) 12/03/2020  HCT 39.5 12/03/2020   MCV 94.3 12/03/2020   PLT 201 12/03/2020   Lab Results  Component Value Date   NA 140 12/03/2020   K 4.5 12/03/2020   CO2 25 12/03/2020   GLUCOSE 105 (H) 12/03/2020   BUN 24 12/03/2020   CREATININE 1.34 (H) 12/03/2020   BILITOT 0.9 12/03/2020   ALKPHOS 51 05/31/2018   AST 15 12/03/2020   ALT 11 12/03/2020   PROT 7.6 12/03/2020   ALBUMIN 3.4 (L) 05/31/2018   CALCIUM 9.4 12/03/2020   ANIONGAP 8 06/02/2018   No results found for: CHOL No results found for: HDL No results found for: LDLCALC No results found for: TRIG No results found for: CHOLHDL Lab Results  Component Value Date   HGBA1C 8.7 (H) 12/03/2020      Assessment & Plan:   Problem List Items Addressed This Visit      Cardiovascular and Mediastinum   HTN (hypertension)    - Today, the patient's blood pressure is well managed . - The patient will continue the current treatment regimen.  - I encouraged the patient to eat a low-sodium diet to help control blood pressure. - I encouraged the patient to live an active lifestyle and complete activities that increases heart rate to 85% target heart rate at least 5 times per week for one hour.            Endocrine   Diabetes mellitus ([place])    - The patient's blood sugar is under control tresiba - The patient will continue the current treatment regimen.  - I encouraged the patient to regularly check blood sugar.  - I encouraged the patient to monitor diet. I encouraged the patient to eat low-carb and low-sugar to help prevent blood sugar spikes.  - I encouraged the patient to continue following their prescribed treatment plan for diabetes - I informed the patient to get help if blood sugar drops below 54mg /dL, or if suddenly have trouble thinking clearly or breathing.            Other   HLD (hyperlipidemia)    -   - I encouraged the  patient to eat more vegetables and whole wheat, and to avoid fatty foods like whole milk, hard cheese, egg yolks, margarine, baked sweets, and fried foods.  - I encouraged the patient to live an active lifestyle and complete activities for 40 minutes at least three times per week.  - I instructed the patient to go to the ER if they begin having chest pain.        Annual physical exam - Primary    Medicines were reviewed.  Patient was notified to reduce her [place] Dr. Lavera Guise 5 units daily  Continue ferrous sulfate lisinopril lovastatin and Metroprolol directed.  His heart is regular chest is clear abdomen is soft nontender without any hepatosplenomegaly there is no pedal edema.  Neurological exam is normal.         No orders of the defined types were placed in this encounter.   Follow-up: No follow-ups on file.    [name], MD

## 2020-12-07 NOTE — Assessment & Plan Note (Signed)
Medicines were reviewed.  Patient was notified to reduce her [place] Dr. Lavera Guise 5 units daily  Continue ferrous sulfate lisinopril lovastatin and Metroprolol directed.  His heart is regular chest is clear abdomen is soft nontender without any hepatosplenomegaly there is no pedal edema.  Neurological exam is normal.

## 2020-12-07 NOTE — Assessment & Plan Note (Signed)
-   -   I encouraged the patient to eat more vegetables and whole wheat, and to avoid fatty foods like whole milk, hard cheese, egg yolks, margarine, baked sweets, and fried foods.  - I encouraged the patient to live an active lifestyle and complete activities for 40 minutes at least three times per week.  - I instructed the patient to go to the ER if they begin having chest pain.

## 2020-12-07 NOTE — Assessment & Plan Note (Signed)
-   The patient's blood sugar is under control tresiba - The patient will continue the current treatment regimen.  - I encouraged the patient to regularly check blood sugar.  - I encouraged the patient to monitor diet. I encouraged the patient to eat low-carb and low-sugar to help prevent blood sugar spikes.  - I encouraged the patient to continue following their prescribed treatment plan for diabetes - I informed the patient to get help if blood sugar drops below 54mg /dL, or if suddenly have trouble thinking clearly or breathing.

## 2020-12-07 NOTE — Assessment & Plan Note (Signed)
-   Today, the patient's blood pressure is well managed . - The patient will continue the current treatment regimen.  - I encouraged the patient to eat a low-sodium diet to help control blood pressure. - I encouraged the patient to live an active lifestyle and complete activities that increases heart rate to 85% target heart rate at least 5 times per week for one hour.     

## 2020-12-18 DIAGNOSIS — K5792 Diverticulitis of intestine, part unspecified, without perforation or abscess without bleeding: Secondary | ICD-10-CM

## 2020-12-18 HISTORY — DX: Diverticulitis of intestine, part unspecified, without perforation or abscess without bleeding: K57.92

## 2020-12-21 ENCOUNTER — Other Ambulatory Visit: Payer: Self-pay

## 2020-12-21 ENCOUNTER — Ambulatory Visit (INDEPENDENT_AMBULATORY_CARE_PROVIDER_SITE_OTHER): Payer: Medicare HMO | Admitting: Internal Medicine

## 2020-12-21 ENCOUNTER — Encounter: Payer: Self-pay | Admitting: Internal Medicine

## 2020-12-21 VITALS — BP 132/78 | HR 140 | Temp 97.3°F | Ht 69.0 in | Wt 171.3 lb

## 2020-12-21 DIAGNOSIS — E1159 Type 2 diabetes mellitus with other circulatory complications: Secondary | ICD-10-CM | POA: Diagnosis not present

## 2020-12-21 DIAGNOSIS — Z794 Long term (current) use of insulin: Secondary | ICD-10-CM | POA: Diagnosis not present

## 2020-12-21 DIAGNOSIS — J219 Acute bronchiolitis, unspecified: Secondary | ICD-10-CM

## 2020-12-21 DIAGNOSIS — J209 Acute bronchitis, unspecified: Secondary | ICD-10-CM | POA: Diagnosis not present

## 2020-12-21 DIAGNOSIS — I2510 Atherosclerotic heart disease of native coronary artery without angina pectoris: Secondary | ICD-10-CM | POA: Diagnosis not present

## 2020-12-21 DIAGNOSIS — J40 Bronchitis, not specified as acute or chronic: Secondary | ICD-10-CM | POA: Insufficient documentation

## 2020-12-21 DIAGNOSIS — Z20822 Contact with and (suspected) exposure to covid-19: Secondary | ICD-10-CM | POA: Insufficient documentation

## 2020-12-21 DIAGNOSIS — I10 Essential (primary) hypertension: Secondary | ICD-10-CM | POA: Diagnosis not present

## 2020-12-21 LAB — POC COVID19 BINAXNOW: SARS Coronavirus 2 Ag: NEGATIVE

## 2020-12-21 MED ORDER — METHYLPREDNISOLONE 4 MG PO TBPK: ORAL_TABLET | ORAL | 0 refills | Status: DC

## 2020-12-21 MED ORDER — AZITHROMYCIN 250 MG PO TABS: ORAL_TABLET | ORAL | 0 refills | Status: DC

## 2020-12-21 NOTE — Assessment & Plan Note (Signed)
Patient is not having any angina or palpitation.  Heart is regular chest decreased breath sounds with few rhonchi.  Abdomen is obese nontender.

## 2020-12-21 NOTE — Assessment & Plan Note (Signed)
Patient complaining of cough no fever no chills coughing with expectoration.  I started him on Z-Pak and Medrol Dosepak he will come back to see me in 1 week.  He is negative Covid test.

## 2020-12-21 NOTE — Assessment & Plan Note (Signed)
Rapid Covid test is negative.

## 2020-12-21 NOTE — Assessment & Plan Note (Signed)
Blood sugar is stable on present medication patient was advised to walk daily.  Follow low-cholesterol low blood sugar diet.  He denies any complaint of his feet.  Minimal numbness in the both feet but no ulcers.

## 2020-12-21 NOTE — Progress Notes (Signed)
Established Patient Office Visit  Subjective:  Patient ID: Michael Simon, male    DOB: 05-26-1933  Age: 87 y.o. MRN: [id number]  CC:  Chief Complaint  Patient presents with  . URI    Patient complains of cough and congestion x1 week, no fever or sore throat. Covid test today was negative    Shortness of Breath This is a recurrent problem. The current episode started in the past 7 days. The problem occurs intermittently. The problem has been gradually worsening. Associated symptoms include leg pain, sputum production and wheezing. Pertinent negatives include no abdominal pain, chest pain, claudication, coryza, ear pain, fever, headaches, hemoptysis, leg swelling, neck pain, orthopnea or PND.    [name] presents for sob pt don't smoke , h/o bypass after ht attack  Past Medical History:  Diagnosis Date  . CAD (coronary artery disease)   . HLD (hyperlipidemia)   . HTN (hypertension)   . LBBB (left bundle branch block)   . Myocardial infarction ([place])   . Osteoarthritis   . Type II diabetes mellitus ([place])     Past Surgical History:  Procedure Laterality Date  . CARDIAC CATHETERIZATION    . CATARACT EXTRACTION    . COLONOSCOPY    . COLONOSCOPY WITH PROPOFOL N/A 08/26/2019   Procedure: COLONOSCOPY WITH PROPOFOL;  Surgeon: [name], MD;  Location: [facility] ENDOSCOPY;  Service: Gastroenterology;  Laterality: N/A;  . COLONOSCOPY WITH PROPOFOL N/A 09/22/2019   Procedure: COLONOSCOPY WITH PROPOFOL;  Surgeon: [name], MD;  Location: [facility] ENDOSCOPY;  Service: Gastroenterology;  Laterality: N/A;  pediatric colonoscope needed  . CORONARY ARTERY BYPASS GRAFT  2012  . EYE SURGERY    . INGUINAL HERNIA REPAIR    . JOINT REPLACEMENT     knees    Family History  Problem Relation Age of Onset  . Arthritis Mother   . Heart disease Mother   . Heart disease Other     Social History   Socioeconomic History  . Marital status: Married    Spouse name: Not on file  .  Number of children: Not on file  . Years of education: Not on file  . Highest education level: Not on file  Occupational History  . Not on file  Tobacco Use  . Smoking status: Former Smoker    Quit date: 03/03/2008    Years since quitting: 12.8  . Smokeless tobacco: Never Used  Vaping Use  . Vaping Use: Never used  Substance and Sexual Activity  . Alcohol use: No    Alcohol/week: 0.0 standard drinks  . Drug use: No  . Sexual activity: Not on file  Other Topics Concern  . Not on file  Social History Narrative  . Not on file   Social Determinants of Health   Financial Resource Strain: Not on file  Food Insecurity: Not on file  Transportation Needs: Not on file  Physical Activity: Not on file  Stress: Not on file  Social Connections: Not on file  Intimate Partner Violence: Not on file     Current Outpatient Medications:  .  azithromycin (ZITHROMAX) 250 MG tablet, 2 tab po daily for 3 days, Disp: 6 tablet, Rfl: 0 .  methylPREDNISolone (MEDROL DOSEPAK) 4 MG TBPK tablet, Take 6 tablets first day 5 tablet next day 4 tablet next day, 2 tablets tablets next day 1 tablet to finish off, Disp: 21 tablet, Rfl: 0 .  acetaminophen (TYLENOL) 325 MG tablet, Take 2 tablets (650 mg total) by mouth  every 6 (six) hours as needed for mild pain (or Fever >/= 101)., Disp: 20 tablet, Rfl: 0 .  aspirin EC 81 MG tablet, Take 81 mg by mouth daily. , Disp: , Rfl:  .  cephALEXin (KEFLEX) 500 MG capsule, Take 1 capsule (500 mg total) by mouth 4 (four) times daily., Disp: 28 capsule, Rfl: 0 .  Cyanocobalamin (RA VITAMIN B-12 TR) 1000 MCG TBCR, Take 1,000 mcg by mouth daily. , Disp: , Rfl:  .  FERREX 150 150 MG capsule, TAKE 1 CAPSULE BY MOUTH EVERY DAY, Disp: 90 capsule, Rfl: 3 .  Ferrous Sulfate (SLOW FE) 142 (45 Fe) MG TBCR, Take 1 tablet by mouth daily., Disp: , Rfl:  .  lisinopril (ZESTRIL) 30 MG tablet, Take 1 tablet (30 mg total) by mouth daily., Disp: 90 tablet, Rfl: 3 .  lovastatin (MEVACOR) 40 MG  tablet, Take 1 tablet (40 mg total) by mouth at bedtime., Disp: 30 tablet, Rfl: 6 .  metoprolol tartrate (LOPRESSOR) 25 MG tablet, Take 12.5 mg by mouth 2 (two) times daily., Disp: , Rfl:  .  Multiple Vitamins-Minerals (CENTRUM SILVER) tablet, Take 1 tablet by mouth daily., Disp: , Rfl:  .  pantoprazole (PROTONIX) 40 MG tablet, TAKE 1 TABLET BY MOUTH EVERY DAY, Disp: 90 tablet, Rfl: 3 .  TRESIBA FLEXTOUCH 100 UNIT/ML FlexTouch Pen, Inject 16 Units into the skin as needed. When blood sugar is over 150, Disp: , Rfl:    No Known Allergies  ROS Review of Systems  Constitutional: Positive for fatigue. Negative for fever.  HENT: Negative for ear pain, postnasal drip and sinus pain.   Eyes: Negative.   Respiratory: Positive for sputum production, shortness of breath and wheezing. Negative for hemoptysis.   Cardiovascular: Negative for chest pain, orthopnea, claudication, leg swelling and PND.  Gastrointestinal: Negative for abdominal pain.  Genitourinary: Negative for dysuria.  Musculoskeletal: Negative for back pain and neck pain.  Neurological: Negative for speech difficulty and headaches.  Psychiatric/Behavioral: Negative for dysphoric mood.      Objective:    Physical Exam Vitals reviewed.  Constitutional:      Appearance: Normal appearance.  HENT:     Mouth/Throat:     Mouth: Mucous membranes are moist.  Eyes:     Pupils: Pupils are equal, round, and reactive to light.  Neck:     Vascular: No carotid bruit.  Cardiovascular:     Rate and Rhythm: Normal rate and regular rhythm.     Pulses: Normal pulses.     Heart sounds: Normal heart sounds.  Pulmonary:     Effort: Pulmonary effort is normal.     Breath sounds: Rhonchi present. No rales.  Abdominal:     General: Bowel sounds are normal.     Palpations: Abdomen is soft. There is no hepatomegaly, splenomegaly or mass.     Tenderness: There is no abdominal tenderness.     Hernia: No hernia is present.  Musculoskeletal:      Cervical back: Neck supple.     Right lower leg: No edema.     Left lower leg: No edema.  Skin:    Findings: No rash.  Neurological:     Mental Status: He is alert and oriented to person, place, and time.     Motor: No weakness.  Psychiatric:        Mood and Affect: Mood normal.        Behavior: Behavior normal.     BP 132/78   Pulse (!) 140  Temp (!) 97.3 F (36.3 C) (Oral)   Ht 5\' 9"  (1.753 m)   Wt 171 lb 4.8 oz (77.7 kg)   SpO2 95%   BMI 25.30 kg/m  Wt Readings from Last 3 Encounters:  12/21/20 171 lb 4.8 oz (77.7 kg)  12/07/20 171 lb 11.2 oz (77.9 kg)  05/03/20 174 lb 4.8 oz (79.1 kg)     Health Maintenance Due  Topic Date Due  . FOOT EXAM  Never done  . OPHTHALMOLOGY EXAM  Never done  . COVID-19 Vaccine (1) Never done  . TETANUS/TDAP  Never done  . PNA vac Low Risk Adult (2 of 2 - PCV13) 03/10/2012  . INFLUENZA VACCINE  07/18/2020    There are no preventive care reminders to display for this patient.  Lab Results  Component Value Date   TSH 3.18 12/03/2020   Lab Results  Component Value Date   WBC 10.6 12/03/2020   HGB 13.1 (L) 12/03/2020   HCT 39.5 12/03/2020   MCV 94.3 12/03/2020   PLT 201 12/03/2020   Lab Results  Component Value Date   NA 140 12/03/2020   K 4.5 12/03/2020   CO2 25 12/03/2020   GLUCOSE 105 (H) 12/03/2020   BUN 24 12/03/2020   CREATININE 1.34 (H) 12/03/2020   BILITOT 0.9 12/03/2020   ALKPHOS 51 05/31/2018   AST 15 12/03/2020   ALT 11 12/03/2020   PROT 7.6 12/03/2020   ALBUMIN 3.4 (L) 05/31/2018   CALCIUM 9.4 12/03/2020   ANIONGAP 8 06/02/2018   No results found for: CHOL No results found for: HDL No results found for: LDLCALC No results found for: TRIG No results found for: CHOLHDL Lab Results  Component Value Date   HGBA1C 8.7 (H) 12/03/2020      Assessment & Plan:   Problem List Items Addressed This Visit      Cardiovascular and Mediastinum   Essential hypertension    Blood pressure stable on the  present medication patient was advised to follow low-salt diet.      CAD (coronary artery disease)    Patient is not having any angina or palpitation.  Heart is regular chest decreased breath sounds with few rhonchi.  Abdomen is obese nontender.        Respiratory   Acute bronchitis and bronchiolitis    Patient complaining of cough no fever no chills coughing with expectoration.  I started him on Z-Pak and Medrol Dosepak he will come back to see me in 1 week.  He is negative Covid test.      Relevant Medications   azithromycin (ZITHROMAX) 250 MG tablet   methylPREDNISolone (MEDROL DOSEPAK) 4 MG TBPK tablet     Endocrine   Diabetes mellitus ([facility])    Blood sugar is stable on present medication patient was advised to walk daily.  Follow low-cholesterol low blood sugar diet.  He denies any complaint of his feet.  Minimal numbness in the both feet but no ulcers.        Other   Suspected COVID-19 virus infection - Primary    Rapid Covid test is negative.      Relevant Orders   POC COVID-19 (Completed)      Meds ordered this encounter  Medications  . azithromycin (ZITHROMAX) 250 MG tablet    Sig: 2 tab po daily for 3 days    Dispense:  6 tablet    Refill:  0  . methylPREDNISolone (MEDROL DOSEPAK) 4 MG TBPK tablet    Sig:  Take 6 tablets first day 5 tablet next day 4 tablet next day, 2 tablets tablets next day 1 tablet to finish off    Dispense:  21 tablet    Refill:  0    Follow-up: No follow-ups on file.    [name], MD

## 2020-12-21 NOTE — Assessment & Plan Note (Signed)
Blood pressure stable on the present medication patient was advised to follow low-salt diet.

## 2021-01-02 ENCOUNTER — Emergency Department: Payer: Medicare HMO

## 2021-01-02 ENCOUNTER — Inpatient Hospital Stay: Payer: Medicare HMO

## 2021-01-02 ENCOUNTER — Encounter: Payer: Self-pay | Admitting: Emergency Medicine

## 2021-01-02 ENCOUNTER — Other Ambulatory Visit: Payer: Self-pay

## 2021-01-02 ENCOUNTER — Inpatient Hospital Stay
Admission: EM | Admit: 2021-01-02 | Discharge: 2021-01-06 | DRG: 391 | Disposition: A | Discharge status: Home Health | Payer: Medicare HMO | Attending: Internal Medicine | Admitting: Internal Medicine

## 2021-01-02 DIAGNOSIS — K5732 Diverticulitis of large intestine without perforation or abscess without bleeding: Secondary | ICD-10-CM | POA: Diagnosis not present

## 2021-01-02 DIAGNOSIS — K5792 Diverticulitis of intestine, part unspecified, without perforation or abscess without bleeding: Secondary | ICD-10-CM

## 2021-01-02 DIAGNOSIS — J439 Emphysema, unspecified: Secondary | ICD-10-CM | POA: Diagnosis not present

## 2021-01-02 DIAGNOSIS — R0602 Shortness of breath: Secondary | ICD-10-CM

## 2021-01-02 DIAGNOSIS — J811 Chronic pulmonary edema: Secondary | ICD-10-CM | POA: Diagnosis present

## 2021-01-02 DIAGNOSIS — Z96653 Presence of artificial knee joint, bilateral: Secondary | ICD-10-CM | POA: Diagnosis present

## 2021-01-02 DIAGNOSIS — Z7982 Long term (current) use of aspirin: Secondary | ICD-10-CM

## 2021-01-02 DIAGNOSIS — J9601 Acute respiratory failure with hypoxia: Secondary | ICD-10-CM | POA: Diagnosis not present

## 2021-01-02 DIAGNOSIS — I959 Hypotension, unspecified: Secondary | ICD-10-CM | POA: Diagnosis not present

## 2021-01-02 DIAGNOSIS — I4891 Unspecified atrial fibrillation: Secondary | ICD-10-CM | POA: Diagnosis present

## 2021-01-02 DIAGNOSIS — M79606 Pain in leg, unspecified: Secondary | ICD-10-CM

## 2021-01-02 DIAGNOSIS — M79605 Pain in left leg: Secondary | ICD-10-CM | POA: Diagnosis not present

## 2021-01-02 DIAGNOSIS — D649 Anemia, unspecified: Secondary | ICD-10-CM | POA: Diagnosis present

## 2021-01-02 DIAGNOSIS — Z794 Long term (current) use of insulin: Secondary | ICD-10-CM | POA: Diagnosis not present

## 2021-01-02 DIAGNOSIS — E538 Deficiency of other specified B group vitamins: Secondary | ICD-10-CM | POA: Diagnosis present

## 2021-01-02 DIAGNOSIS — N179 Acute kidney failure, unspecified: Secondary | ICD-10-CM | POA: Diagnosis not present

## 2021-01-02 DIAGNOSIS — M79604 Pain in right leg: Secondary | ICD-10-CM | POA: Diagnosis not present

## 2021-01-02 DIAGNOSIS — E1165 Type 2 diabetes mellitus with hyperglycemia: Secondary | ICD-10-CM | POA: Diagnosis not present

## 2021-01-02 DIAGNOSIS — Z951 Presence of aortocoronary bypass graft: Secondary | ICD-10-CM

## 2021-01-02 DIAGNOSIS — I2510 Atherosclerotic heart disease of native coronary artery without angina pectoris: Secondary | ICD-10-CM | POA: Diagnosis present

## 2021-01-02 DIAGNOSIS — J90 Pleural effusion, not elsewhere classified: Secondary | ICD-10-CM | POA: Diagnosis not present

## 2021-01-02 DIAGNOSIS — R079 Chest pain, unspecified: Secondary | ICD-10-CM | POA: Diagnosis not present

## 2021-01-02 DIAGNOSIS — I447 Left bundle-branch block, unspecified: Secondary | ICD-10-CM | POA: Diagnosis not present

## 2021-01-02 DIAGNOSIS — R778 Other specified abnormalities of plasma proteins: Secondary | ICD-10-CM | POA: Diagnosis present

## 2021-01-02 DIAGNOSIS — I517 Cardiomegaly: Secondary | ICD-10-CM | POA: Diagnosis not present

## 2021-01-02 DIAGNOSIS — R059 Cough, unspecified: Secondary | ICD-10-CM

## 2021-01-02 DIAGNOSIS — I509 Heart failure, unspecified: Secondary | ICD-10-CM | POA: Diagnosis not present

## 2021-01-02 DIAGNOSIS — Z8249 Family history of ischemic heart disease and other diseases of the circulatory system: Secondary | ICD-10-CM | POA: Diagnosis not present

## 2021-01-02 DIAGNOSIS — J189 Pneumonia, unspecified organism: Secondary | ICD-10-CM | POA: Diagnosis not present

## 2021-01-02 DIAGNOSIS — M79661 Pain in right lower leg: Secondary | ICD-10-CM | POA: Diagnosis not present

## 2021-01-02 DIAGNOSIS — E118 Type 2 diabetes mellitus with unspecified complications: Secondary | ICD-10-CM

## 2021-01-02 DIAGNOSIS — Z87891 Personal history of nicotine dependence: Secondary | ICD-10-CM

## 2021-01-02 DIAGNOSIS — I252 Old myocardial infarction: Secondary | ICD-10-CM

## 2021-01-02 DIAGNOSIS — I700 Atherosclerosis of aorta: Secondary | ICD-10-CM | POA: Diagnosis present

## 2021-01-02 DIAGNOSIS — Z79899 Other long term (current) drug therapy: Secondary | ICD-10-CM | POA: Diagnosis not present

## 2021-01-02 DIAGNOSIS — I4892 Unspecified atrial flutter: Secondary | ICD-10-CM | POA: Diagnosis not present

## 2021-01-02 DIAGNOSIS — M79662 Pain in left lower leg: Secondary | ICD-10-CM | POA: Diagnosis not present

## 2021-01-02 DIAGNOSIS — E785 Hyperlipidemia, unspecified: Secondary | ICD-10-CM | POA: Diagnosis present

## 2021-01-02 DIAGNOSIS — I483 Typical atrial flutter: Secondary | ICD-10-CM | POA: Diagnosis not present

## 2021-01-02 DIAGNOSIS — I10 Essential (primary) hypertension: Secondary | ICD-10-CM | POA: Diagnosis present

## 2021-01-02 DIAGNOSIS — Z20822 Contact with and (suspected) exposure to covid-19: Secondary | ICD-10-CM | POA: Diagnosis present

## 2021-01-02 DIAGNOSIS — R0902 Hypoxemia: Secondary | ICD-10-CM

## 2021-01-02 DIAGNOSIS — R109 Unspecified abdominal pain: Secondary | ICD-10-CM | POA: Diagnosis not present

## 2021-01-02 DIAGNOSIS — J9811 Atelectasis: Secondary | ICD-10-CM | POA: Diagnosis not present

## 2021-01-02 DIAGNOSIS — M6281 Muscle weakness (generalized): Secondary | ICD-10-CM | POA: Diagnosis not present

## 2021-01-02 DIAGNOSIS — R1031 Right lower quadrant pain: Secondary | ICD-10-CM | POA: Diagnosis not present

## 2021-01-02 DIAGNOSIS — E1159 Type 2 diabetes mellitus with other circulatory complications: Secondary | ICD-10-CM | POA: Diagnosis not present

## 2021-01-02 DIAGNOSIS — E119 Type 2 diabetes mellitus without complications: Secondary | ICD-10-CM

## 2021-01-02 LAB — LACTIC ACID, PLASMA: Lactic Acid, Venous: 1.5 mmol/L (ref 0.5–1.9)

## 2021-01-02 LAB — CBC WITH DIFFERENTIAL/PLATELET
Abs Immature Granulocytes: 0.05 10*3/uL (ref 0.00–0.07)
Basophils Absolute: 0.1 10*3/uL (ref 0.0–0.1)
Basophils Relative: 1 %
Eosinophils Absolute: 0.2 10*3/uL (ref 0.0–0.5)
Eosinophils Relative: 2 %
HCT: 35 % — ABNORMAL LOW (ref 39.0–52.0)
Hemoglobin: 11.1 g/dL — ABNORMAL LOW (ref 13.0–17.0)
Immature Granulocytes: 1 %
Lymphocytes Relative: 12 %
Lymphs Abs: 1.1 10*3/uL (ref 0.7–4.0)
MCH: 30.6 pg (ref 26.0–34.0)
MCHC: 31.7 g/dL (ref 30.0–36.0)
MCV: 96.4 fL (ref 80.0–100.0)
Monocytes Absolute: 0.6 10*3/uL (ref 0.1–1.0)
Monocytes Relative: 6 %
Neutro Abs: 7.4 10*3/uL (ref 1.7–7.7)
Neutrophils Relative %: 78 %
Platelets: 189 10*3/uL (ref 150–400)
RBC: 3.63 MIL/uL — ABNORMAL LOW (ref 4.22–5.81)
RDW: 13.9 % (ref 11.5–15.5)
WBC: 9.3 10*3/uL (ref 4.0–10.5)
nRBC: 0 % (ref 0.0–0.2)

## 2021-01-02 LAB — LIPASE, BLOOD: Lipase: 28 U/L (ref 11–51)

## 2021-01-02 LAB — IRON AND TIBC
Iron: 25 ug/dL — ABNORMAL LOW (ref 45–182)
Saturation Ratios: 10 % — ABNORMAL LOW (ref 17.9–39.5)
TIBC: 263 ug/dL (ref 250–450)
UIBC: 238 ug/dL

## 2021-01-02 LAB — COMPREHENSIVE METABOLIC PANEL
ALT: 18 U/L (ref 0–44)
AST: 20 U/L (ref 15–41)
Albumin: 4.1 g/dL (ref 3.5–5.0)
Alkaline Phosphatase: 79 U/L (ref 38–126)
Anion gap: 11 (ref 5–15)
BUN: 28 mg/dL — ABNORMAL HIGH (ref 8–23)
CO2: 28 mmol/L (ref 22–32)
Calcium: 9 mg/dL (ref 8.9–10.3)
Chloride: 102 mmol/L (ref 98–111)
Creatinine, Ser: 1.39 mg/dL — ABNORMAL HIGH (ref 0.61–1.24)
GFR, Estimated: 50 mL/min — ABNORMAL LOW (ref >60)
Glucose, Bld: 310 mg/dL — ABNORMAL HIGH (ref 70–99)
Potassium: 4.1 mmol/L (ref 3.5–5.1)
Sodium: 141 mmol/L (ref 135–145)
Total Bilirubin: 0.9 mg/dL (ref 0.3–1.2)
Total Protein: 8.2 g/dL — ABNORMAL HIGH (ref 6.5–8.1)

## 2021-01-02 LAB — BASIC METABOLIC PANEL
Anion gap: 10 (ref 5–15)
BUN: 25 mg/dL — ABNORMAL HIGH (ref 8–23)
CO2: 25 mmol/L (ref 22–32)
Calcium: 7.7 mg/dL — ABNORMAL LOW (ref 8.9–10.3)
Chloride: 106 mmol/L (ref 98–111)
Creatinine, Ser: 1.34 mg/dL — ABNORMAL HIGH (ref 0.61–1.24)
GFR, Estimated: 52 mL/min — ABNORMAL LOW (ref >60)
Glucose, Bld: 168 mg/dL — ABNORMAL HIGH (ref 70–99)
Potassium: 4 mmol/L (ref 3.5–5.1)
Sodium: 141 mmol/L (ref 135–145)

## 2021-01-02 LAB — RETICULOCYTES
Immature Retic Fract: 22.2 % — ABNORMAL HIGH (ref 2.3–15.9)
RBC.: 3.01 MIL/uL — ABNORMAL LOW (ref 4.22–5.81)
Retic Count, Absolute: 58.1 10*3/uL (ref 19.0–186.0)
Retic Ct Pct: 1.9 % (ref 0.4–3.1)

## 2021-01-02 LAB — TROPONIN I (HIGH SENSITIVITY)
Troponin I (High Sensitivity): 33 ng/L — ABNORMAL HIGH (ref <18)
Troponin I (High Sensitivity): 28 ng/L — ABNORMAL HIGH (ref <18)
Troponin I (High Sensitivity): 26 ng/L — ABNORMAL HIGH (ref <18)

## 2021-01-02 LAB — HEMOGLOBIN AND HEMATOCRIT, BLOOD
HCT: 29 % — ABNORMAL LOW (ref 39.0–52.0)
Hemoglobin: 9 g/dL — ABNORMAL LOW (ref 13.0–17.0)

## 2021-01-02 LAB — CBC
HCT: 28.4 % — ABNORMAL LOW (ref 39.0–52.0)
Hemoglobin: 8.6 g/dL — ABNORMAL LOW (ref 13.0–17.0)
MCH: 29.7 pg (ref 26.0–34.0)
MCHC: 30.3 g/dL (ref 30.0–36.0)
MCV: 97.9 fL (ref 80.0–100.0)
Platelets: 148 10*3/uL — ABNORMAL LOW (ref 150–400)
RBC: 2.9 MIL/uL — ABNORMAL LOW (ref 4.22–5.81)
RDW: 13.9 % (ref 11.5–15.5)
WBC: 13.5 10*3/uL — ABNORMAL HIGH (ref 4.0–10.5)
nRBC: 0 % (ref 0.0–0.2)

## 2021-01-02 LAB — PHOSPHORUS: Phosphorus: 3.6 mg/dL (ref 2.5–4.6)

## 2021-01-02 LAB — FERRITIN: Ferritin: 88 ng/mL (ref 24–336)

## 2021-01-02 LAB — TSH: TSH: 3.698 u[IU]/mL (ref 0.350–4.500)

## 2021-01-02 LAB — MAGNESIUM: Magnesium: 1.7 mg/dL (ref 1.7–2.4)

## 2021-01-02 LAB — FIBRIN DERIVATIVES D-DIMER (ARMC ONLY): Fibrin derivatives D-dimer (ARMC): 3225.3 ng/mL (FEU) — ABNORMAL HIGH (ref 0.00–499.00)

## 2021-01-02 LAB — FOLATE: Folate: 14.3 ng/mL (ref >5.9)

## 2021-01-02 LAB — PROCALCITONIN: Procalcitonin: <0.1 ng/mL

## 2021-01-02 MED ORDER — SODIUM CHLORIDE 0.9 % IV SOLN: INTRAVENOUS | Status: AC

## 2021-01-02 MED ORDER — MORPHINE SULFATE (PF) 2 MG/ML IV SOLN
2.0000 mg | Freq: Once | INTRAVENOUS | Status: AC
  Administered 2021-01-02: 2 mg via INTRAVENOUS
  Filled 2021-01-02: qty 1

## 2021-01-02 MED ORDER — METRONIDAZOLE IN NACL 5-0.79 MG/ML-% IV SOLN
500.0000 mg | Freq: Once | INTRAVENOUS | Status: AC
  Administered 2021-01-02: 500 mg via INTRAVENOUS
  Filled 2021-01-02: qty 100

## 2021-01-02 MED ORDER — PRAVASTATIN SODIUM 20 MG PO TABS
40.0000 mg | ORAL_TABLET | Freq: Every day | ORAL | Status: DC
  Administered 2021-01-03 – 2021-01-05 (×3): 40 mg via ORAL
  Filled 2021-01-02: qty 1
  Filled 2021-01-02: qty 2
  Filled 2021-01-02: qty 1
  Filled 2021-01-02: qty 2
  Filled 2021-01-02: qty 1

## 2021-01-02 MED ORDER — METOPROLOL TARTRATE 25 MG PO TABS
12.5000 mg | ORAL_TABLET | Freq: Two times a day (BID) | ORAL | Status: DC
  Filled 2021-01-02: qty 1

## 2021-01-02 MED ORDER — SODIUM CHLORIDE 0.9% IV SOLUTION
Freq: Once | INTRAVENOUS | Status: AC
  Filled 2021-01-02: qty 250

## 2021-01-02 MED ORDER — INSULIN GLARGINE 100 UNIT/ML ~~LOC~~ SOLN
16.0000 [IU] | Freq: Every day | SUBCUTANEOUS | Status: DC
  Administered 2021-01-03 (×2): 16 [IU] via SUBCUTANEOUS
  Filled 2021-01-02 (×3): qty 0.16

## 2021-01-02 MED ORDER — SODIUM CHLORIDE 0.9 % IV BOLUS
500.0000 mL | Freq: Once | INTRAVENOUS | Status: AC
  Administered 2021-01-02: 500 mL via INTRAVENOUS

## 2021-01-02 MED ORDER — HYDROCODONE-ACETAMINOPHEN 5-325 MG PO TABS: 1.0000 | ORAL_TABLET | ORAL | Status: DC | PRN

## 2021-01-02 MED ORDER — ENOXAPARIN SODIUM 80 MG/0.8ML ~~LOC~~ SOLN
1.0000 mg/kg | Freq: Two times a day (BID) | SUBCUTANEOUS | Status: DC
  Administered 2021-01-02: 80 mg via SUBCUTANEOUS
  Filled 2021-01-02 (×2): qty 0.8

## 2021-01-02 MED ORDER — SODIUM CHLORIDE 0.9 % IV BOLUS
1000.0000 mL | Freq: Once | INTRAVENOUS | Status: AC
  Administered 2021-01-02: 1000 mL via INTRAVENOUS

## 2021-01-02 MED ORDER — IOHEXOL 300 MG/ML  SOLN
100.0000 mL | Freq: Once | INTRAMUSCULAR | Status: AC | PRN
  Administered 2021-01-02: 100 mL via INTRAVENOUS

## 2021-01-02 MED ORDER — ACETAMINOPHEN 650 MG RE SUPP: 650.0000 mg | Freq: Four times a day (QID) | RECTAL | Status: DC | PRN

## 2021-01-02 MED ORDER — AMIODARONE HCL IN DEXTROSE 360-4.14 MG/200ML-% IV SOLN
60.0000 mg/h | INTRAVENOUS | Status: DC
  Administered 2021-01-02: 60 mg/h via INTRAVENOUS
  Filled 2021-01-02 (×2): qty 200

## 2021-01-02 MED ORDER — AMIODARONE HCL IN DEXTROSE 360-4.14 MG/200ML-% IV SOLN: 30.0000 mg/h | INTRAVENOUS | Status: DC

## 2021-01-02 MED ORDER — DIGOXIN 0.25 MG/ML IJ SOLN
0.2500 mg | Freq: Once | INTRAMUSCULAR | Status: AC
  Administered 2021-01-03: 0.25 mg via INTRAVENOUS
  Filled 2021-01-02: qty 2

## 2021-01-02 MED ORDER — SODIUM CHLORIDE 0.9 % IV SOLN
1.0000 g | INTRAVENOUS | Status: DC
  Administered 2021-01-02 – 2021-01-05 (×4): 1 g via INTRAVENOUS
  Filled 2021-01-02 (×5): qty 10

## 2021-01-02 MED ORDER — INSULIN ASPART 100 UNIT/ML ~~LOC~~ SOLN
0.0000 [IU] | Freq: Three times a day (TID) | SUBCUTANEOUS | Status: DC
  Administered 2021-01-05: 2 [IU] via SUBCUTANEOUS
  Administered 2021-01-05: 3 [IU] via SUBCUTANEOUS
  Filled 2021-01-02 (×2): qty 1

## 2021-01-02 MED ORDER — AMIODARONE LOAD VIA INFUSION
150.0000 mg | Freq: Once | INTRAVENOUS | Status: AC
  Administered 2021-01-02: 150 mg via INTRAVENOUS
  Filled 2021-01-02: qty 83.34

## 2021-01-02 MED ORDER — ACETAMINOPHEN 325 MG PO TABS: 650.0000 mg | ORAL_TABLET | Freq: Four times a day (QID) | ORAL | Status: DC | PRN

## 2021-01-02 MED ORDER — ONDANSETRON HCL 4 MG/2ML IJ SOLN: 4.0000 mg | Freq: Four times a day (QID) | INTRAMUSCULAR | Status: DC | PRN

## 2021-01-02 MED ORDER — ONDANSETRON HCL 4 MG/2ML IJ SOLN
4.0000 mg | Freq: Once | INTRAMUSCULAR | Status: AC
  Administered 2021-01-02: 4 mg via INTRAVENOUS
  Filled 2021-01-02: qty 2

## 2021-01-02 MED ORDER — CIPROFLOXACIN IN D5W 400 MG/200ML IV SOLN
400.0000 mg | Freq: Once | INTRAVENOUS | Status: AC
  Administered 2021-01-02: 400 mg via INTRAVENOUS
  Filled 2021-01-02: qty 200

## 2021-01-02 MED ORDER — PANTOPRAZOLE SODIUM 40 MG IV SOLR
40.0000 mg | Freq: Two times a day (BID) | INTRAVENOUS | Status: DC
  Administered 2021-01-02 – 2021-01-06 (×8): 40 mg via INTRAVENOUS
  Filled 2021-01-02 (×8): qty 40

## 2021-01-02 MED ORDER — ONDANSETRON HCL 4 MG PO TABS: 4.0000 mg | ORAL_TABLET | Freq: Four times a day (QID) | ORAL | Status: DC | PRN

## 2021-01-02 MED ORDER — MAGNESIUM SULFATE 2 GM/50ML IV SOLN
2.0000 g | Freq: Once | INTRAVENOUS | Status: AC
  Administered 2021-01-03: 2 g via INTRAVENOUS
  Filled 2021-01-02: qty 50

## 2021-01-02 MED ORDER — VITAMIN B-12 1000 MCG PO TABS
1000.0000 ug | ORAL_TABLET | Freq: Every day | ORAL | Status: DC
  Administered 2021-01-03 – 2021-01-06 (×4): 1000 ug via ORAL
  Filled 2021-01-02 (×4): qty 1

## 2021-01-02 MED ORDER — METRONIDAZOLE IN NACL 5-0.79 MG/ML-% IV SOLN
500.0000 mg | Freq: Three times a day (TID) | INTRAVENOUS | Status: DC
  Administered 2021-01-03 – 2021-01-05 (×6): 500 mg via INTRAVENOUS
  Filled 2021-01-02 (×10): qty 100

## 2021-01-02 MED ORDER — PANTOPRAZOLE SODIUM 40 MG PO TBEC: 40.0000 mg | DELAYED_RELEASE_TABLET | Freq: Every day | ORAL | Status: DC

## 2021-01-02 NOTE — ED Triage Notes (Signed)
Pt reports pain to his RLQ all day, sharp in nature

## 2021-01-02 NOTE — ED Notes (Signed)
Dr. Marcello Moores at bedside to consent for blood.

## 2021-01-02 NOTE — ED Provider Notes (Signed)
[facility] Emergency Department Provider Note   ____________________________________________   Event Date/Time   First MD Initiated Contact with Patient 01/02/21 1525     (approximate)  I have reviewed the triage vital signs and the nursing notes.   HISTORY  Chief Complaint Abdominal Pain    HPI Michael Simon is a 87 y.o. male with a history of coronary disease left bundle branch block diabetes type 2  Patient started experiencing moderate to severe pain in his deep right lower abdomen after lunch today.  He reports he also noticed his urine seemed a little bit orange in color  The pain is deep and sharp.  Nothing seems to make it better or worse.  No nausea or vomiting.  He has never had any abdominal surgeries.  He does have a bellybutton hernia, but reports that is unchanged not causing an issue  Denies pain in the groin or testicles.  No penile pain or discharge     Past Medical History:  Diagnosis Date  . CAD (coronary artery disease)   . HLD (hyperlipidemia)   . HTN (hypertension)   . LBBB (left bundle branch block)   . Myocardial infarction ([place])   . Osteoarthritis   . Type II diabetes mellitus [facility])     Patient Active Problem List   Diagnosis Date Noted  . Atrial flutter ([place]) 01/02/2021  . Acute bronchitis and bronchiolitis 12/21/2020  . Suspected COVID-19 virus infection 12/21/2020  . Annual physical exam 12/07/2020  . Paronychia of great toe, right 04/23/2020  . Fever 05/30/2018  . Weakness 02/02/2018  . Abscess of right earlobe 02/02/2018  . Generalized weakness 02/02/2018  . Hematochezia 07/26/2015  . Diabetes mellitus ([place]) 07/26/2015  . Essential hypertension 07/26/2015  . HLD (hyperlipidemia) 07/26/2015  . CAD (coronary artery disease) 07/26/2015    Past Surgical History:  Procedure Laterality Date  . CARDIAC CATHETERIZATION    . CATARACT EXTRACTION    . COLONOSCOPY    . COLONOSCOPY WITH PROPOFOL N/A  08/26/2019   Procedure: COLONOSCOPY WITH PROPOFOL;  Surgeon: [name], MD;  Location: [facility] ENDOSCOPY;  Service: Gastroenterology;  Laterality: N/A;  . COLONOSCOPY WITH PROPOFOL N/A 09/22/2019   Procedure: COLONOSCOPY WITH PROPOFOL;  Surgeon: [name], MD;  Location: [facility] ENDOSCOPY;  Service: Gastroenterology;  Laterality: N/A;  pediatric colonoscope needed  . CORONARY ARTERY BYPASS GRAFT  2012  . EYE SURGERY    . INGUINAL HERNIA REPAIR    . JOINT REPLACEMENT     knees    Prior to Admission medications   Medication Sig Start Date End Date Taking? Authorizing Provider  acetaminophen (TYLENOL) 325 MG tablet Take 2 tablets (650 mg total) by mouth every 6 (six) hours as needed for mild pain (or Fever >/= 101). 02/02/18   Arrien, Jimmy Picket, MD  aspirin EC 81 MG tablet Take 81 mg by mouth daily.     [provider]  azithromycin (ZITHROMAX) 250 MG tablet 2 tab po daily for 3 days 12/21/20   [name], MD  cephALEXin (KEFLEX) 500 MG capsule Take 1 capsule (500 mg total) by mouth 4 (four) times daily. 04/23/20   [name], MD  Cyanocobalamin (RA VITAMIN B-12 TR) 1000 MCG TBCR Take 1,000 mcg by mouth daily.     [provider]  FERREX 150 150 MG capsule TAKE 1 CAPSULE BY MOUTH EVERY DAY 06/15/20   [name], MD  Ferrous Sulfate (SLOW FE) 142 (45 Fe) MG TBCR Take 1 tablet by  mouth daily.    [provider]  lisinopril (ZESTRIL) 30 MG tablet Take 1 tablet (30 mg total) by mouth daily. 05/10/20   [name], MD  lovastatin (MEVACOR) 40 MG tablet Take 1 tablet (40 mg total) by mouth at bedtime. 08/16/20   [name], MD  methylPREDNISolone (MEDROL DOSEPAK) 4 MG TBPK tablet Take 6 tablets first day 5 tablet next day 4 tablet next day, 2 tablets tablets next day 1 tablet to finish off 12/21/20   [name], MD  metoprolol tartrate (LOPRESSOR) 25 MG tablet Take 12.5 mg by mouth 2 (two) times daily.    [provider]  Multiple Vitamins-Minerals (CENTRUM  SILVER) tablet Take 1 tablet by mouth daily. 02/11/18   [provider]  pantoprazole (PROTONIX) 40 MG tablet TAKE 1 TABLET BY MOUTH EVERY DAY 08/10/20   Masoud, Viann Shove, MD  TRESIBA FLEXTOUCH 100 UNIT/ML FlexTouch Pen Inject 16 Units into the skin as needed. When blood sugar is over 150 12/20/19   [provider]    Allergies Patient has no known allergies.  Family History  Problem Relation Age of Onset  . Arthritis Mother   . Heart disease Mother   . Heart disease Other     Social History Social History   Tobacco Use  . Smoking status: Former Smoker    Quit date: 03/03/2008    Years since quitting: 12.8  . Smokeless tobacco: Never Used  Vaping Use  . Vaping Use: Never used  Substance Use Topics  . Alcohol use: No    Alcohol/week: 0.0 standard drinks  . Drug use: No    Review of Systems Constitutional: No fever/chills Eyes: No visual changes. ENT: No sore throat. Cardiovascular: Denies chest pain. Respiratory: Denies shortness of breath.  He has not noticed his heart racing Gastrointestinal: See HPI Genitourinary: Negative for dysuria.  Orangeish colored urine Musculoskeletal: Negative for back pain. Skin: Negative for rash. Neurological: Negative for headaches, areas of focal weakness or numbness.    ____________________________________________   PHYSICAL EXAM:  VITAL SIGNS: ED Triage Vitals  Enc Vitals Group     BP 01/02/21 1532 (!) 147/58     Pulse Rate 01/02/21 1532 (!) 140     Resp 01/02/21 1532 20     Temp 01/02/21 1532 97.7 F (36.5 C)     Temp src --      SpO2 01/02/21 1532 100 %     Weight 01/02/21 1513 175 lb (79.4 kg)     Height 01/02/21 1513 5\' 9"  (1.753 m)     Head Circumference --      Peak Flow --      Pain Score 01/02/21 1513 9     Pain Loc --      Pain Edu? --      Excl. in [place]? --     Constitutional: Alert and oriented. Well appearing and in no acute distress. Eyes: Conjunctivae are normal. Head: Atraumatic. Nose:  No congestion/rhinnorhea. Mouth/Throat: Mucous membranes are moist. Neck: No stridor.  Cardiovascular: Tachycardic and regular rhythm. grossly normal heart sounds.  Good peripheral circulation. Respiratory: Normal respiratory effort.  No retractions. Lungs CTAB. Gastrointestinal: Soft and nontender except right lower quadrant where he has moderate reproducible tenderness at roughly McBurney's point and in suprapubic region. No distention.  No CVA tenderness bilateral Musculoskeletal: No lower extremity tenderness nor edema. Neurologic:  Normal speech and language. No gross focal neurologic deficits are appreciated.  Skin:  Skin is warm, dry and intact. No rash  noted. Psychiatric: Mood and affect are normal. Speech and behavior are normal.  ____________________________________________   LABS (all labs ordered are listed, but only abnormal results are displayed)  Labs Reviewed  CBC WITH DIFFERENTIAL/PLATELET - Abnormal; Notable for the following components:      Result Value   RBC 3.63 (*)    Hemoglobin 11.1 (*)    HCT 35.0 (*)    All other components within normal limits  COMPREHENSIVE METABOLIC PANEL - Abnormal; Notable for the following components:   Glucose, Bld 310 (*)    BUN 28 (*)    Creatinine, Ser 1.39 (*)    Total Protein 8.2 (*)    GFR, Estimated 50 (*)    All other components within normal limits  HEMOGLOBIN AND HEMATOCRIT, BLOOD - Abnormal; Notable for the following components:   Hemoglobin 9.0 (*)    HCT 29.0 (*)    All other components within normal limits  CBC - Abnormal; Notable for the following components:   WBC 13.5 (*)    RBC 2.90 (*)    Hemoglobin 8.6 (*)    HCT 28.4 (*)    Platelets 148 (*)    All other components within normal limits  FIBRIN DERIVATIVES D-DIMER ([facility] ONLY) - Abnormal; Notable for the following components:   Fibrin derivatives D-dimer ([facility]) 3,225.30 (*)    All other components within normal limits  IRON AND TIBC - Abnormal; Notable  for the following components:   Iron 25 (*)    Saturation Ratios 10 (*)    All other components within normal limits  RETICULOCYTES - Abnormal; Notable for the following components:   RBC. 3.01 (*)    Immature Retic Fract 22.2 (*)    All other components within normal limits  BASIC METABOLIC PANEL - Abnormal; Notable for the following components:   Glucose, Bld 168 (*)    BUN 25 (*)    Creatinine, Ser 1.34 (*)    Calcium 7.7 (*)    GFR, Estimated 52 (*)    All other components within normal limits  TROPONIN I (HIGH SENSITIVITY) - Abnormal; Notable for the following components:   Troponin I (High Sensitivity) 33 (*)    All other components within normal limits  TROPONIN I (HIGH SENSITIVITY) - Abnormal; Notable for the following components:   Troponin I (High Sensitivity) 28 (*)    All other components within normal limits  TROPONIN I (HIGH SENSITIVITY) - Abnormal; Notable for the following components:   Troponin I (High Sensitivity) 26 (*)    All other components within normal limits  SARS CORONAVIRUS 2 (TAT 6-24 HRS)  URINE CULTURE  SARS CORONAVIRUS 2 (TAT 6-24 HRS)  LIPASE, BLOOD  TSH  FOLATE  FERRITIN  MAGNESIUM  PHOSPHORUS  URINALYSIS, COMPLETE (UACMP) WITH MICROSCOPIC  HEMOGLOBIN A1C  CBC  BASIC METABOLIC PANEL  URINALYSIS, ROUTINE W REFLEX MICROSCOPIC  C-REACTIVE PROTEIN  VITAMIN B12  OCCULT BLOOD X 1 CARD TO LAB, STOOL  PROCALCITONIN  PROCALCITONIN  BRAIN NATRIURETIC PEPTIDE  LACTIC ACID, PLASMA  LACTIC ACID, PLASMA  HEMOGLOBIN  HEMOGLOBIN  HEMOGLOBIN  HEMOGLOBIN  PREPARE RBC (CROSSMATCH)  TYPE AND SCREEN  TROPONIN I (HIGH SENSITIVITY)   ____________________________________________  EKG  Reviewed and interpreted at 1515 Heart rate 140 QRS 150 QTC 540 Underlying left bundle branch block, probable atrial flutter versus sinus tachycardia.  Discussed with cardiology as well, Dr. [name] who also reviewed and reports unclar if STach or flutter.   ____________________________________________  RADIOLOGY  CT Abdomen Pelvis W  Contrast  Result Date: 01/02/2021 CLINICAL DATA:  Right lower quadrant pain that started today. EXAM: CT ABDOMEN AND PELVIS WITH CONTRAST TECHNIQUE: Multidetector CT imaging of the abdomen and pelvis was performed using the standard protocol following bolus administration of intravenous contrast. CONTRAST:  146mL OMNIPAQUE IOHEXOL 300 MG/ML  SOLN COMPARISON:  None. FINDINGS: Lower chest: Interval development of dependent cystic changes at the right base. Coronary artery calcifications. Hepatobiliary: Similar-appearing multiloculated fluid density lesion within the liver measuring simple fluid density and likely representing a simple hepatic cyst. Redemonstration of several subcentimeter/pericentimeter hypodensities are too small to characterize. Gallbladder is unremarkable. No biliary ductal dilatation. Pancreas: Pancreatic parenchyma calcifications scattered. No focal pancreatic lesion. Normal pancreatic contour with no definite inflammatory changes surrounding it. No main pancreatic Normal in size without focal abnormality. duct dilatation. Spleen: Normal in size without focal abnormality. Adrenals/Urinary Tract: No adrenal nodule bilaterally. Bilateral kidneys enhance symmetrically. There is a 2.6 cm fluid density lesion within the right kidney that likely represents a simple renal cyst. No hydronephrosis. No hydroureter. The urinary bladder is unremarkable. Stomach/Bowel: Stomach is within normal limits. No evidence of small bowel wall thickening or dilatation. Diffuse colonic diverticulosis. Sigmoid bowel wall thickening with associated trace pericolonic fat stranding. No intramural abscess formation. Appendix appears normal. Vascular/Lymphatic: No abdominal aorta aneurysm. Aneurysmal dilatation of bilateral common iliac arteries measuring up to 1.7 cm bilaterally. Severe atherosclerotic plaque of the aorta and its branches. No  abdominal, pelvic, or inguinal lymphadenopathy. Reproductive: Prostate is unremarkable. Other: No intraperitoneal free fluid. No intraperitoneal free gas. No organized fluid collection. Musculoskeletal: Small fat containing umbilical hernia with an abdominal wall defect of 1.3 cm. Levoscoliosis of the lumbar spine centered at the L3-L4 level. No suspicious lytic or blastic osseous lesions. No acute displaced fracture. Multilevel degenerative changes of the spine. IMPRESSION: 1. Diffuse colonic diverticulosis with uncomplicated sigmoid diverticulitis. No abscess formation or bowel perforation. Recommend colonoscopy status post treatment and status post complete resolution of inflammatory changes to evaluate for an underlying lesion as a malignancy cannot be fully excluded. 2. Bilateral common iliac artery aneurysm measuring up to 1.7 cm. 3. Findings suggestive of chronic pancreatitis. 4.  Aortic Atherosclerosis (ICD10-I70.0). Electronically Signed   By: Iven Finn M.D.   On: 01/02/2021 17:33   DG Chest Port 1 View  Result Date: 01/02/2021 CLINICAL DATA:  Coronary artery disease, history of myocardial infarction. Presenting with lower abdominal pain. EXAM: PORTABLE CHEST 1 VIEW COMPARISON:  Chest x-ray 12/30/2017, CT chest 04/19/2007 FINDINGS: Persistent cardiomegaly. Surgical changes related to cardiac surgery. The heart size and mediastinal contours are otherwise unchanged. Aortic arch calcifications. Left lower lobe opacity. Increased interstitial markings. Suggestion of bilateral, left greater than right, at least trace to small volume pleural effusions. No pneumothorax. No acute osseous abnormality. Redemonstration of sternotomy wires that appear fractured. IMPRESSION: 1. Suggestion of bilateral, left greater than right, at least trace to small volume pleural effusions. 2. Mild pulmonary edema in the setting of cardiomegaly with underlying infection/inflammation not excluded. 3. Followup PA and lateral  chest X-ray is recommended in 3-4 weeks following therapy to ensure resolution and exclude underlying malignancy. Electronically Signed   By: Iven Finn M.D.   On: 01/02/2021 21:36    CT abdomen reviewed, noted as above please see at full findings from radiologist, but most notable evidence of uncomplicated sigmoid diverticulitis ____________________________________________   PROCEDURES  Procedure(s) performed: None  Procedures  Critical Care performed: Yes, see critical care note(s)  CRITICAL CARE Performed by: Delman Kitten  Total critical care time: 25 minutes  Critical care time was exclusive of separately billable procedures and treating other patients.  Critical care was necessary to treat or prevent imminent or life-threatening deterioration.  Critical care was time spent personally by me on the following activities: development of treatment plan with patient and/or surrogate as well as nursing, discussions with consultants, evaluation of patient's response to treatment, examination of patient, obtaining history from patient or surrogate, ordering and performing treatments and interventions, ordering and review of laboratory studies, ordering and review of radiographic studies, pulse oximetry and re-evaluation of patient's condition.  ____________________________________________   INITIAL IMPRESSION / ASSESSMENT AND PLAN / ED COURSE  Pertinent labs & imaging results that were available during my care of the patient were reviewed by me and considered in my medical decision making (see chart for details).   Differential diagnosis includes but is not limited to, abdominal perforation, aortic dissection, cholecystitis, appendicitis, diverticulitis, colitis, esophagitis/gastritis, kidney stone, pyelonephritis, urinary tract infection, aortic aneurysm. All are considered in decision and treatment plan. Based upon the patient's presentation and risk factors, proceed with CT  imaging   Clinical Course as of 01/02/21 2353  Nancy Fetter Jan 02, 2021  1545 Discussed and reviewed EKG with patient's cardiologist Dr. [name]; he advises may represent flutter or sinus tachycardia.  At this point recommend medical work-up for abdominal pain, consider hydration and await labs.  Does not recommend any acute intervention related to heart rate at this time.  I am agreeable with this plan [MQ]  1613 Hemoglobin(!): 11.1 Mild anemia (history of same) [MQ]  1634 Patient reports all pain and symptoms are gone now. Feels well. Awaiting CT [MQ]  1637 Troponin I (High Sensitivity)(!): 33 Elevated, he is pain free. Denies dyspnea or any associated chest pains  [MQ]  1746 Discussed with patient and his son at the bedside recommendations for treatment of diverticulitis.  He has no chest pain and denies palpitations.  No respiratory symptoms.  Currently his pain is well controlled, he is fully awake and alert.  Heart rate remains elevated though however at about 135.  Ordered repeat EKG and after fluids and pain medications he continues to be tachycardic making me more suspect he may have an atrial arrhythmia [MQ]  1935 Discussed with Dr. [name] regarding patient's persistent tachycardia, suspect underlying flutter.  Dr. Bevely Palmer to initiate amiodarone infusion and cardiology to consult on patient in hospital tomorrow.  Patient and son at bedside both understand agreeable with this plan.  Currently pain-free resting [MQ]    Clinical Course User Index [MQ] Delman Kitten, MD    Admission discussed with hospitalist Dr. Marcello Moores ____________________________________________   FINAL CLINICAL IMPRESSION(S) / ED DIAGNOSES  Final diagnoses:  Diverticulitis  Atrial flutter, unspecified type [facility])        Note:  This document was prepared using Dragon voice recognition software and may include unintentional dictation errors       Delman Kitten, MD 01/02/21 2354

## 2021-01-02 NOTE — ED Notes (Signed)
Dr. Marcello Moores at beside updated on pt vitals and gave new orders.

## 2021-01-02 NOTE — ED Notes (Signed)
Phlebotomist notified to collect additional labs.

## 2021-01-02 NOTE — H&P (Addendum)
History and Physical    Michael Simon YHC:[id number] DOB: [birth year] DOA: 01/02/2021  PCP: [name], MD  Patient coming from: home  I have personally briefly reviewed patient's old medical records in [place]  Chief Complaint: mild lower  x hours  HPI: Michael Simon is a 87 y.o. male with medical history significant of CAD s/p MI, HLD, LBBB, DMII , HTN who presents to ED with complaint of lower abdominal  Pain on the right, described as sharp in nature that started acutely after lunch day of presentation. Patient notes no associated emesis, no diarrhea, no fever/ chills/ or sweats. He notes he was in his normal state of health prior to the onset of abdominal pain. He notes no cough , chest pain , palpitations , dysuria or presyncope.  He does  Have interim history of bronchitis 2-3 weeks ago but notes his symptoms have completely resolved.  He notes he is fully vaccinated for covid and has gotten his booster.    ED Course: In ed vitals:  T97.7 bp 147/58 - 98/60 hr 140 rr 20 , sat 100% on ra  EKG: atrial flutter with rvr Case discussed with Dr [name] Per cardiology recommends starting amiodarone drip Labs: Wbc:9.3, hb 11.1 down from 13.1, closer to prior baseline of 10, MCV 96.4 plt 189 Na: 141, K 4.1 , glu 310, cr 1.39 up from base of 1.17 prior 1.34.  CE 33 TSH 3.698  CT abdomen: 1. Diffuse colonic diverticulosis with uncomplicated sigmoid diverticulitis. No abscess formation or bowel perforation. Recommend colonoscopy status post treatment and status post complete resolution of inflammatory changes to evaluate for an underlying lesion as a malignancy cannot be fully excluded. 2. Bilateral common iliac artery aneurysm measuring up to 1.7 cm. 3. Findings suggestive of chronic pancreatitis. 4.  Aortic Atherosclerosis (ICD10-I70.0).1. Diffuse colonic diverticulosis with uncomplicated sigmoid diverticulitis. No abscess formation or bowel perforation.  Recommend colonoscopy status post treatment and status post complete resolution of inflammatory changes to evaluate for an underlying lesion as a malignancy cannot be fully excluded. 2. Bilateral common iliac artery aneurysm measuring up to 1.7 cm. 3. Findings suggestive of chronic pancreatitis. 4.  Aortic Atherosclerosis (ICD10-I70.0).  Tx; zofran, ns 500cc, morphine 2mg x1 Review of Systems: As per HPI otherwise 10 point review of systems negative.   Past Medical History:  Diagnosis Date  . CAD (coronary artery disease)   . HLD (hyperlipidemia)   . HTN (hypertension)   . LBBB (left bundle branch block)   . Myocardial infarction ([place])   . Osteoarthritis   . Type II diabetes mellitus ([place])     Past Surgical History:  Procedure Laterality Date  . CARDIAC CATHETERIZATION    . CATARACT EXTRACTION    . COLONOSCOPY    . COLONOSCOPY WITH PROPOFOL N/A 08/26/2019   Procedure: COLONOSCOPY WITH PROPOFOL;  Surgeon: [name], MD;  Location: [facility] ENDOSCOPY;  Service: Gastroenterology;  Laterality: N/A;  . COLONOSCOPY WITH PROPOFOL N/A 09/22/2019   Procedure: COLONOSCOPY WITH PROPOFOL;  Surgeon: [name], MD;  Location[facility] END[facility]COPY;  Service: Gastroenterology;  Laterality: N/A;  pediatric colonoscope needed  . CORONARY ARTERY BYPASS GRAFT  2012  . EYE SURGERY    . INGUINAL HERNIA REPAIR    . JOINT REPLACEMENT     knees     reports that he quit smoking about 12 years ago. He has never used smokeless tobacco. He reports that he does not drink alcohol and does not use drugs.  No Known  Allergies  Family History  Problem Relation Age of Onset  . Arthritis Mother   . Heart disease Mother   . Heart disease Other     Prior to Admission medications   Medication Sig Start Date End Date Taking? Authorizing Provider  acetaminophen (TYLENOL) 325 MG tablet Take 2 tablets (650 mg total) by mouth every 6 (six) hours as needed for mild pain (or Fever >/= 101). 02/02/18   Arrien, Jimmy Picket, MD  aspirin EC 81 MG tablet Take 81 mg by mouth daily.     [provider]  azithromycin (ZITHROMAX) 250 MG tablet 2 tab po daily for 3 days 12/21/20   [name], MD  cephALEXin (KEFLEX) 500 MG capsule Take 1 capsule (500 mg total) by mouth 4 (four) times daily. 04/23/20   [name], MD  Cyanocobalamin (RA VITAMIN B-12 TR) 1000 MCG TBCR Take 1,000 mcg by mouth daily.     [provider]  FERREX 150 150 MG capsule TAKE 1 CAPSULE BY MOUTH EVERY DAY 06/15/20   [name], MD  Ferrous Sulfate (SLOW FE) 142 (45 Fe) MG TBCR Take 1 tablet by mouth daily.    [provider]  lisinopril (ZESTRIL) 30 MG tablet Take 1 tablet (30 mg total) by mouth daily. 05/10/20   [name], MD  lovastatin (MEVACOR) 40 MG tablet Take 1 tablet (40 mg total) by mouth at bedtime. 08/16/20   [name], MD  methylPREDNISolone (MEDROL DOSEPAK) 4 MG TBPK tablet Take 6 tablets first day 5 tablet next day 4 tablet next day, 2 tablets tablets next day 1 tablet to finish off 12/21/20   [name], MD  metoprolol tartrate (LOPRESSOR) 25 MG tablet Take 12.5 mg by mouth 2 (two) times daily.    [provider]  Multiple Vitamins-Minerals (CENTRUM SILVER) tablet Take 1 tablet by mouth daily. 02/11/18   [provider]  pantoprazole (PROTONIX) 40 MG tablet TAKE 1 TABLET BY MOUTH EVERY DAY 08/10/20   Masoud, Viann Shove, MD  TRESIBA FLEXTOUCH 100 UNIT/ML FlexTouch Pen Inject 16 Units into the skin as needed. When blood sugar is over 150 12/20/19   [provider]    Physical Exam: Vitals:   01/02/21 1630 01/02/21 1800 01/02/21 1830 01/02/21 1900  BP: 115/74 96/68 102/67   Pulse:      Resp: 17 17 19 17   Temp:      SpO2:      Weight:      Height:         Vitals:   01/02/21 1630 01/02/21 1800 01/02/21 1830 01/02/21 1900  BP: 115/74 96/68 102/67   Pulse:      Resp: 17 17 19 17   Temp:      SpO2:      Weight:      Height:      Constitutional: NAD, calm,  comfortable Eyes: PERRL, lids and conjunctivae normal ENMT: Mucous membranes are moist. Posterior pharynx clear of any exudate or lesions.Normal dentition.  Neck: normal, supple, no masses, no thyromegaly Respiratory: faint crackles left base, no wheezing, no rhonchi. Normal respiratory effort. No accessory muscle use.  Cardiovascular: Regular rate and tachycardic, no murmurs / rubs / gallops. No extremity edema. + pedal pulses Abdomen: no tenderness currently on palpation,+umblicial hernia, no masses palpated. No hepatosplenomegaly. Bowel sounds positive.  Musculoskeletal: no clubbing / cyanosis. No joint deformity upper and lower extremities. Good ROM, no contractures. Normal muscle tone.  Skin: no rashes, lesions, ulcers. No induration Neurologic: CN 2-12  grossly intact. Sensation intactStrength 5/5 in all 4.  Psychiatric: Normal judgment and insight. Alert and oriented x 3. Normal mood.    Labs on Admission: I have personally reviewed following labs and imaging studies  CBC: Recent Labs  Lab 01/02/21 1538  WBC 9.3  NEUTROABS 7.4  HGB 11.1*  HCT 35.0*  MCV 96.4  PLT 314   Basic Metabolic Panel: Recent Labs  Lab 01/02/21 1538  NA 141  K 4.1  CL 102  CO2 28  GLUCOSE 310*  BUN 28*  CREATININE 1.39*  CALCIUM 9.0   GFR: Estimated Creatinine Clearance: 38.9 mL/min (A) (by C-G formula based on SCr of 1.39 mg/dL (H)). Liver Function Tests: Recent Labs  Lab 01/02/21 1538  AST 20  ALT 18  ALKPHOS 79  BILITOT 0.9  PROT 8.2*  ALBUMIN 4.1   Recent Labs  Lab 01/02/21 1538  LIPASE 28   No results for input(s): AMMONIA in the last 168 hours. Coagulation Profile: No results for input(s): INR, PROTIME in the last 168 hours. Cardiac Enzymes: No results for input(s): CKTOTAL, CKMB, CKMBINDEX, TROPONINI in the last 168 hours. BNP (last 3 results) No results for input(s): PROBNP in the last 8760 hours. HbA1C: No results for input(s): HGBA1C in the last 72  hours. CBG: No results for input(s): GLUCAP in the last 168 hours. Lipid Profile: No results for input(s): CHOL, HDL, LDLCALC, TRIG, CHOLHDL, LDLDIRECT in the last 72 hours. Thyroid Function Tests: Recent Labs    01/02/21 1538  TSH 3.698   Anemia Panel: No results for input(s): VITAMINB12, FOLATE, FERRITIN, TIBC, IRON, RETICCTPCT in the last 72 hours. Urine analysis:    Component Value Date/Time   COLORURINE YELLOW (A) 05/31/2018 2216   APPEARANCEUR CLEAR (A) 05/31/2018 2216   LABSPEC 1.018 05/31/2018 2216   PHURINE 5.0 05/31/2018 2216   GLUCOSEU 50 (A) 05/31/2018 2216   HGBUR SMALL (A) 05/31/2018 2216   [place] NEGATIVE 05/31/2018 2216   [place] NEGATIVE 05/31/2018 2216   PROTEINUR 30 (A) 05/31/2018 2216   NITRITE NEGATIVE 05/31/2018 2216   LEUKOCYTESUR NEGATIVE 05/31/2018 2216    Radiological Exams on Admission: CT Abdomen Pelvis W Contrast  Result Date: 01/02/2021 CLINICAL DATA:  Right lower quadrant pain that started today. EXAM: CT ABDOMEN AND PELVIS WITH CONTRAST TECHNIQUE: Multidetector CT imaging of the abdomen and pelvis was performed using the standard protocol following bolus administration of intravenous contrast. CONTRAST:  184mL OMNIPAQUE IOHEXOL 300 MG/ML  SOLN COMPARISON:  None. FINDINGS: Lower chest: Interval development of dependent cystic changes at the right base. Coronary artery calcifications. Hepatobiliary: Similar-appearing multiloculated fluid density lesion within the liver measuring simple fluid density and likely representing a simple hepatic cyst. Redemonstration of several subcentimeter/pericentimeter hypodensities are too small to characterize. Gallbladder is unremarkable. No biliary ductal dilatation. Pancreas: Pancreatic parenchyma calcifications scattered. No focal pancreatic lesion. Normal pancreatic contour with no definite inflammatory changes surrounding it. No main pancreatic Normal in size without focal abnormality. duct dilatation.  Spleen: Normal in size without focal abnormality. Adrenals/Urinary Tract: No adrenal nodule bilaterally. Bilateral kidneys enhance symmetrically. There is a 2.6 cm fluid density lesion within the right kidney that likely represents a simple renal cyst. No hydronephrosis. No hydroureter. The urinary bladder is unremarkable. Stomach/Bowel: Stomach is within normal limits. No evidence of small bowel wall thickening or dilatation. Diffuse colonic diverticulosis. Sigmoid bowel wall thickening with associated trace pericolonic fat stranding. No intramural abscess formation. Appendix appears normal. Vascular/Lymphatic: No abdominal aorta aneurysm. Aneurysmal dilatation of bilateral common iliac  arteries measuring up to 1.7 cm bilaterally. Severe atherosclerotic plaque of the aorta and its branches. No abdominal, pelvic, or inguinal lymphadenopathy. Reproductive: Prostate is unremarkable. Other: No intraperitoneal free fluid. No intraperitoneal free gas. No organized fluid collection. Musculoskeletal: Small fat containing umbilical hernia with an abdominal wall defect of 1.3 cm. Levoscoliosis of the lumbar spine centered at the L3-L4 level. No suspicious lytic or blastic osseous lesions. No acute displaced fracture. Multilevel degenerative changes of the spine. IMPRESSION: 1. Diffuse colonic diverticulosis with uncomplicated sigmoid diverticulitis. No abscess formation or bowel perforation. Recommend colonoscopy status post treatment and status post complete resolution of inflammatory changes to evaluate for an underlying lesion as a malignancy cannot be fully excluded. 2. Bilateral common iliac artery aneurysm measuring up to 1.7 cm. 3. Findings suggestive of chronic pancreatitis. 4.  Aortic Atherosclerosis (ICD10-I70.0). Electronically Signed   By: Iven Finn M.D.   On: 01/02/2021 17:33    EKG: Independently reviewed. As noted above  Assessment/Plan  Acute sigmoid diverticulitis -ctx and metronidazole   -clear liquids advance to bland/low residue diet as tolerated  -supportive care ivfs/ prn antiemetic and pain medications   New Onset Atrial flutter  -started on amiodarone drip in ed as recommended by Dr [name] due to soft BP -consider digoxin if hr not controlled on amiodarone and patient note able to tolerate bb/ccb due to lower blood pressures  - start lovenox and transition to NOAC if h/h remain stable -Echo in AM -f/u further cardiology recs in am   Abn lung exam  -crackles on left, prior history of recent upper uri s/p tx  - respiratory panel , dedicated cxr pending  Hx of CAD s/p MI  Hx of LBBB now with Abn CE  -due to demand  Related to RVR /not thought to be ACS -noted no significant delta /patient without chest pain  -will continue to monitor ce over night to be complete, echo ordered for am  -will resume home cardiac regimen     HLD -continue statin    DMII  -noted hyperglycemia on admission  -lantus 16 units as well as is /fs    HTN  -hold ace, metoprolol, due to low blood pressures  Anemia  R/o gi losses -on iron as out patient - note low hgb from baseline  -cycle h/h, transfuse if further decline - no complaint of bleeding , no dark stools or blood in stools  -will check anemia panel /iron stores may need iv iron  -also to be complete will check FOBT especially with need for anticoag due to aftrial flutter -will consult gi for further assistant   B12 def  -continue supplement   DVT prophylaxis:lovenox  Code Status:  Full Family Communication: no family at bedside  Disposition Plan: patient  expected to be admitted greater than 2 midnights Consults called: FATH MD , cardiology , Allen Norris MD [place] Admission status: inpatient   Clance Boll MD [facility]  If 7PM-7AM, please contact night-coverage [email or web address] Password [facility]  01/02/2021, 7:53 PM

## 2021-01-02 NOTE — ED Notes (Signed)
Patient transported to CT 

## 2021-01-02 NOTE — Progress Notes (Addendum)
Called by rn for low bp with systolic 16'X, Per RN bp dropped s/p initiation of amiodarone bolus.  ON evaluation  Patient is note to asymptomatic , noted hr 120-118 regular down from 130's.  After bolus started BP noted to be improving now in systolic high 09'U.   Plan  -hold amiodarone for now  -repeat ekg , bmp, mag , phos - reasses s/p bolus  - if hr remains elevated will give trial of dig  -ce, being cycled , echo ordered for am , full cardiology consult pending for am.  D-dimer elevated -ctpa /lower extremity doppler pending

## 2021-01-03 ENCOUNTER — Inpatient Hospital Stay: Payer: Medicare HMO

## 2021-01-03 ENCOUNTER — Encounter: Payer: Self-pay | Admitting: Internal Medicine

## 2021-01-03 DIAGNOSIS — I483 Typical atrial flutter: Secondary | ICD-10-CM

## 2021-01-03 LAB — CBC
HCT: 33.8 % — ABNORMAL LOW (ref 39.0–52.0)
Hemoglobin: 10.8 g/dL — ABNORMAL LOW (ref 13.0–17.0)
MCH: 30.7 pg (ref 26.0–34.0)
MCHC: 32 g/dL (ref 30.0–36.0)
MCV: 96 fL (ref 80.0–100.0)
Platelets: 153 10*3/uL (ref 150–400)
RBC: 3.52 MIL/uL — ABNORMAL LOW (ref 4.22–5.81)
RDW: 14.2 % (ref 11.5–15.5)
WBC: 11.2 10*3/uL — ABNORMAL HIGH (ref 4.0–10.5)
nRBC: 0 % (ref 0.0–0.2)

## 2021-01-03 LAB — CBG MONITORING, ED
Glucose-Capillary: 115 mg/dL — ABNORMAL HIGH (ref 70–99)
Glucose-Capillary: 54 mg/dL — ABNORMAL LOW (ref 70–99)
Glucose-Capillary: 61 mg/dL — ABNORMAL LOW (ref 70–99)
Glucose-Capillary: 67 mg/dL — ABNORMAL LOW (ref 70–99)
Glucose-Capillary: 79 mg/dL (ref 70–99)

## 2021-01-03 LAB — URINALYSIS, COMPLETE (UACMP) WITH MICROSCOPIC
Bacteria, UA: NONE SEEN
Bilirubin Urine: NEGATIVE
Glucose, UA: 50 mg/dL — AB
Hgb urine dipstick: NEGATIVE
Ketones, ur: NEGATIVE mg/dL
Leukocytes,Ua: NEGATIVE
Nitrite: NEGATIVE
Protein, ur: 30 mg/dL — AB
Specific Gravity, Urine: >1.046 — ABNORMAL HIGH (ref 1.005–1.030)
pH: 5 (ref 5.0–8.0)

## 2021-01-03 LAB — HEMOGLOBIN A1C
Hgb A1c MFr Bld: 8.2 % — ABNORMAL HIGH (ref 4.8–5.6)
Mean Plasma Glucose: 188.64 mg/dL

## 2021-01-03 LAB — LACTIC ACID, PLASMA: Lactic Acid, Venous: 1.3 mmol/L (ref 0.5–1.9)

## 2021-01-03 LAB — C-REACTIVE PROTEIN: CRP: 0.7 mg/dL (ref <1.0)

## 2021-01-03 LAB — TROPONIN I (HIGH SENSITIVITY): Troponin I (High Sensitivity): 27 ng/L — ABNORMAL HIGH (ref <18)

## 2021-01-03 LAB — BASIC METABOLIC PANEL
Anion gap: 11 (ref 5–15)
BUN: 25 mg/dL — ABNORMAL HIGH (ref 8–23)
CO2: 26 mmol/L (ref 22–32)
Calcium: 8 mg/dL — ABNORMAL LOW (ref 8.9–10.3)
Chloride: 107 mmol/L (ref 98–111)
Creatinine, Ser: 1.22 mg/dL (ref 0.61–1.24)
GFR, Estimated: 58 mL/min — ABNORMAL LOW (ref >60)
Glucose, Bld: 120 mg/dL — ABNORMAL HIGH (ref 70–99)
Potassium: 4.2 mmol/L (ref 3.5–5.1)
Sodium: 144 mmol/L (ref 135–145)

## 2021-01-03 LAB — HEMOGLOBIN
Hemoglobin: 10.7 g/dL — ABNORMAL LOW (ref 13.0–17.0)
Hemoglobin: 11.1 g/dL — ABNORMAL LOW (ref 13.0–17.0)
Hemoglobin: 9.7 g/dL — ABNORMAL LOW (ref 13.0–17.0)

## 2021-01-03 LAB — PREPARE RBC (CROSSMATCH)

## 2021-01-03 LAB — PROCALCITONIN: Procalcitonin: <0.1 ng/mL

## 2021-01-03 LAB — VITAMIN B12: Vitamin B-12: 2066 pg/mL — ABNORMAL HIGH (ref 180–914)

## 2021-01-03 LAB — BRAIN NATRIURETIC PEPTIDE: B Natriuretic Peptide: 467.7 pg/mL — ABNORMAL HIGH (ref 0.0–100.0)

## 2021-01-03 LAB — SARS CORONAVIRUS 2 (TAT 6-24 HRS): SARS Coronavirus 2: NEGATIVE

## 2021-01-03 MED ORDER — IOHEXOL 350 MG/ML SOLN: 75.0000 mL | Freq: Once | INTRAVENOUS | Status: DC | PRN

## 2021-01-03 MED ORDER — AMIODARONE HCL 200 MG PO TABS
400.0000 mg | ORAL_TABLET | Freq: Two times a day (BID) | ORAL | Status: DC
  Administered 2021-01-03 – 2021-01-04 (×3): 400 mg via ORAL
  Filled 2021-01-03 (×4): qty 2

## 2021-01-03 MED ORDER — METOPROLOL TARTRATE 25 MG PO TABS
12.5000 mg | ORAL_TABLET | Freq: Two times a day (BID) | ORAL | Status: DC
  Administered 2021-01-03 – 2021-01-05 (×6): 12.5 mg via ORAL
  Filled 2021-01-03 (×7): qty 1

## 2021-01-03 MED ORDER — DIGOXIN 0.25 MG/ML IJ SOLN
0.2500 mg | Freq: Once | INTRAMUSCULAR | Status: DC
  Filled 2021-01-03 (×2): qty 2

## 2021-01-03 MED ORDER — IOHEXOL 350 MG/ML SOLN
100.0000 mL | Freq: Once | INTRAVENOUS | Status: AC | PRN
  Administered 2021-01-03: 75 mL via INTRAVENOUS

## 2021-01-03 NOTE — ED Notes (Signed)
Called dietary regarding missing pt meal tray at this time. Per dietary, tray was sent but this RN could not find tray so they will resend at this time

## 2021-01-03 NOTE — ED Notes (Signed)
Patient provided with warm blanket per request. Patient denies further needs at this time.

## 2021-01-03 NOTE — ED Notes (Signed)
Meal tray given at this time 

## 2021-01-03 NOTE — Progress Notes (Signed)
OT Cancellation Note  Patient Details Name: Michael Simon MRN: [id number] DOB: 1933-01-07   Cancelled Treatment:    Reason Eval/Treat Not Completed: Medical issues which prohibited therapy. Consult received, chart reviewed. Pt pending full cardiac consult, HR in 130's (target 65-105 per nursing order), and given new amiodarone infusion initiated 01/02/21 1902 and stopped 01/02/21 1916. Will hold OT evaluation and exertional activity at this time and re-attempt at later date/time as medically appropriate pending cardiac consult completed, updated plan of care, and medically appropriate.   Jeni Salles, MPH, MS, OTR/L ascom [phone] 01/03/21, 8:24 AM

## 2021-01-03 NOTE — ED Notes (Signed)
RN continues to await Pravastatin from pharmacy.

## 2021-01-03 NOTE — Progress Notes (Signed)
PROGRESS NOTE    Michael BOCOCK  ZSW:[id number] DOB: 10-12-1933 DOA: 01/02/2021 PCP: [name], MD   Brief Narrative:  87 y.o. male with medical history significant of CAD s/p MI, HLD, LBBB, DMII , HTN who presents to ED with complaint of lower abdominal  Pain on the right, described as sharp in nature that started acutely after lunch day of presentation. Patient notes no associated emesis, no diarrhea, no fever/ chills/ or sweats. He notes he was in his normal state of health prior to the onset of abdominal pain. He notes no cough , chest pain , palpitations , dysuria or presyncope.  He does  Have interim history of bronchitis 2-3 weeks ago but notes his symptoms have completely resolved.  He notes he is fully vaccinated for covid and has gotten his booster.   IV amiodarone attempted yesterday with intolerance due to drop in systolic blood pressure.  Seen by cardiology.  Transition to p.o. amiodarone and attempt to control ventricular rate.  No indication for gastroenterology evaluation at this time.  Patient had a history of colonoscopy in 2020 without recommendations for repeat.  Received 1 unit of blood for hemoglobin 8.6.  Hemoglobin stable since with no recurrence of GI bleed.   Assessment & Plan:   Active Problems:   Atrial flutter (HCC)  Acute sigmoid diverticulitis Noted on CAT scan No abscess or perforation Patient hemodynamically stable Plan: Clear liquid diet Advance to soft diet as tolerated Continue Rocephin and Flagyl Gentle IVF As needed antiemetics Possible discharge in a.m. if tolerating p.o.  Atrial fibrillation/flutter with rapid ventricular response Initially attempted amiodarone bolus but patient had a drop in systolic blood pressure Seen by cardiology, recommendations appreciated Plan: Telemetry monitoring Amiodarone p.o. 400 mg twice daily IV digoxin x1 per cardiology Follow-up TTE Will address anticoagulation before discharge Defer  anticoagulation for now given concern for GI bleed in the setting of diverticular disease  Acute anemia On p.o. ferrous sulfate as outpatient Hemoglobin dropped low of 8.6 No bleeding or dark stools noted Transfuse 1 unit packed red cells Follow-up anemia panel Consider IV iron Defer GI consultation for now  B12 deficiency Continue p.o. supplement  Hypertension Currently home ACE and metoprolol hold due to hypotension Restart as appropriate  Type 2 diabetes mellitus with hyperglycemia Basal bolus regimen Carb modified diet when appropriate Diabetes coordinator consult    DVT prophylaxis: SCDs Code Status: Full Family Communication: Left VM for son Lijah Bourque [phone] on 01/03/2021 Disposition Plan:Status is: Inpatient  Remains inpatient appropriate because:Inpatient level of care appropriate due to severity of illness   Dispo: The patient is from: Home              Anticipated d/c is to: Home              Anticipated d/c date is: 1 day              Patient currently is not medically stable to d/c.  Sigmoid diverticulitis and atrial flutter with rapid ventricular response.  Will need stabilization of heart rate and tolerance of p.o. intake prior to discharge.  Anticipate discharge in 24 hours.   Consultants:   Cardiology  Procedures:   None  Antimicrobials:  Flagyl Rocephin   Subjective: Patient seen and examined.  Resting in bed.  Does not dorsum abdominal pain.  No chest pain or palpitations endorsed.  Objective: Vitals:   01/03/21 0646 01/03/21 0700 01/03/21 0830 01/03/21 1030  BP: 116/82 111/86 98/64 114/75  Pulse: Marland Kitchen)  130 (!) 131  95  Resp:  (!) 24 (!) 24 (!) 23  Temp:   98.1 F (36.7 C)   TempSrc:   Oral   SpO2:  97%  99%  Weight:      Height:        Intake/Output Summary (Last 24 hours) at 01/03/2021 1421 Last data filed at 01/03/2021 0354 Gross per 24 hour  Intake 1498.57 ml  Output -  Net 1498.57 ml   Filed Weights    01/02/21 1513  Weight: 79.4 kg    Examination:  General exam: Appears calm and comfortable  Respiratory system: Clear to auscultation. Respiratory effort normal. Cardiovascular system: Tachycardic, irregular rhythm, no murmurs  gastrointestinal system: Soft, mild diffuse tender to palpation, nondistended, normal bowel sounds Central nervous system: Alert and oriented. No focal neurological deficits. Extremities: Symmetric 5 x 5 power. Skin: No rashes, lesions or ulcers Psychiatry: Judgement and insight appear normal. Mood & affect appropriate.     Data Reviewed: I have personally reviewed following labs and imaging studies  CBC: Recent Labs  Lab 01/02/21 1538 01/02/21 2130 01/02/21 2236 01/03/21 0513 01/03/21 0919  WBC 9.3 13.5*  --  11.2*  --   NEUTROABS 7.4  --   --   --   --   HGB 11.1* 8.6* 9.0* 10.8* 10.7*  HCT 35.0* 28.4* 29.0* 33.8*  --   MCV 96.4 97.9  --  96.0  --   PLT 189 148*  --  153  --    Basic Metabolic Panel: Recent Labs  Lab 01/02/21 1538 01/02/21 2236 01/03/21 0513  NA 141 141 144  K 4.1 4.0 4.2  CL 102 106 107  CO2 28 25 26   GLUCOSE 310* 168* 120*  BUN 28* 25* 25*  CREATININE 1.39* 1.34* 1.22  CALCIUM 9.0 7.7* 8.0*  MG  --  1.7  --   PHOS  --  3.6  --    GFR: Estimated Creatinine Clearance: 44.3 mL/min (by C-G formula based on SCr of 1.22 mg/dL). Liver Function Tests: Recent Labs  Lab 01/02/21 1538  AST 20  ALT 18  ALKPHOS 79  BILITOT 0.9  PROT 8.2*  ALBUMIN 4.1   Recent Labs  Lab 01/02/21 1538  LIPASE 28   No results for input(s): AMMONIA in the last 168 hours. Coagulation Profile: No results for input(s): INR, PROTIME in the last 168 hours. Cardiac Enzymes: No results for input(s): CKTOTAL, CKMB, CKMBINDEX, TROPONINI in the last 168 hours. BNP (last 3 results) No results for input(s): PROBNP in the last 8760 hours. HbA1C: Recent Labs    01/02/21 2236  HGBA1C 8.2*   CBG: Recent Labs  Lab 01/03/21 0824  01/03/21 1140 01/03/21 1234  GLUCAP 79 54* 61*   Lipid Profile: No results for input(s): CHOL, HDL, LDLCALC, TRIG, CHOLHDL, LDLDIRECT in the last 72 hours. Thyroid Function Tests: Recent Labs    01/02/21 1538  TSH 3.698   Anemia Panel: Recent Labs    01/02/21 2236  VITAMINB12 2,066*  FOLATE 14.3  FERRITIN 88  TIBC 263  IRON 25*  RETICCTPCT 1.9   Sepsis Labs: Recent Labs  Lab 01/02/21 2236 01/02/21 2309 01/03/21 0513  PROCALCITON <0.10  --  <0.10  LATICACIDVEN  --  1.5 1.3    No results found for this or any previous visit (from the past 240 hour(s)).       Radiology Studies: CT ANGIO CHEST PE W OR WO CONTRAST  Result Date: 01/03/2021 CLINICAL DATA:  Elevated D-dimer with pain EXAM: CT ANGIOGRAPHY CHEST WITH CONTRAST TECHNIQUE: Multidetector CT imaging of the chest was performed using the standard protocol during bolus administration of intravenous contrast. Multiplanar CT image reconstructions and MIPs were obtained to evaluate the vascular anatomy. CONTRAST:  56mL OMNIPAQUE IOHEXOL 350 MG/ML SOLN COMPARISON:  Chest radiograph January 02, 2021 FINDINGS: Cardiovascular: There is no demonstrable pulmonary embolus. There is no thoracic aortic aneurysm. No dissection is seen; note that the contrast bolus in the aorta is not sufficient for dissection assessment. There are foci of calcification in visualized great vessels. There are multiple foci of aortic atherosclerosis. There are foci of native coronary artery calcification. Patient is status post coronary artery bypass grafting. There is a degree of cardiomegaly. Mediastinum/Nodes: Thyroid is diminutive without focal lesion. There is a precarinal lymph node measuring 1.4 x 1.2 cm. There are several scattered subcentimeter mediastinal lymph nodes as well. No esophageal lesions are appreciable. Lungs/Pleura: There is underlying centrilobular and paraseptal emphysematous change with scattered areas of peripheral fibrosis. There  is suspected scarring in the anterior left apex. There is a free-flowing right pleural effusion with atelectasis and mild consolidation in the right lower lobe. There is peripheral lobular septal thickening in the lower lobes, suspicious for a degree of interstitial edema. Upper Abdomen: There is reflux of contrast into the inferior vena cava and hepatic veins. There is a cyst near the junction of the right and left lobes of the liver measuring 3.2 x 2.0 cm. Gallbladder is absent. There is extensive upper abdominal aortic and visualized great vessel atherosclerosis. Musculoskeletal: Patient is status post median sternotomy. Sternal separation noted throughout the sternum despite wire fixation. No blastic or lytic bone lesions. No evident chest wall lesions. Review of the MIP images confirms the above findings. IMPRESSION: 1. No demonstrable pulmonary embolus. No thoracic aortic aneurysm. No dissection seen with caveat that there is insufficient contrast within the aorta to assess for potential dissection. There is aortic atherosclerosis as well as foci of great vessel and native coronary artery calcification. Patient is status post coronary artery bypass grafting. There is cardiomegaly. 2. Right pleural effusion with right base atelectasis and suspected small focus of pneumonia right base posteriorly. 3. Underlying emphysematous change with scarring and fibrotic type change, primarily in the upper lobe regions. 4. Suspect lower lobe interstitial pulmonary edema. There may well be a degree of congestive heart failure. 5. Reflux of contrast into the inferior vena cava and hepatic veins, a finding that likely is indicative of increased right heart pressure. 6.  Sternal separation despite wire fixation. 7.  Gallbladder absent. Aortic Atherosclerosis (ICD10-I70.0) and Emphysema (ICD10-J43.9). Electronically Signed   By: Lowella Grip III M.D.   On: 01/03/2021 07:52   CT Abdomen Pelvis W Contrast  Result Date:  01/02/2021 CLINICAL DATA:  Right lower quadrant pain that started today. EXAM: CT ABDOMEN AND PELVIS WITH CONTRAST TECHNIQUE: Multidetector CT imaging of the abdomen and pelvis was performed using the standard protocol following bolus administration of intravenous contrast. CONTRAST:  138mL OMNIPAQUE IOHEXOL 300 MG/ML  SOLN COMPARISON:  None. FINDINGS: Lower chest: Interval development of dependent cystic changes at the right base. Coronary artery calcifications. Hepatobiliary: Similar-appearing multiloculated fluid density lesion within the liver measuring simple fluid density and likely representing a simple hepatic cyst. Redemonstration of several subcentimeter/pericentimeter hypodensities are too small to characterize. Gallbladder is unremarkable. No biliary ductal dilatation. Pancreas: Pancreatic parenchyma calcifications scattered. No focal pancreatic lesion. Normal pancreatic contour with no definite inflammatory changes surrounding it. No  main pancreatic Normal in size without focal abnormality. duct dilatation. Spleen: Normal in size without focal abnormality. Adrenals/Urinary Tract: No adrenal nodule bilaterally. Bilateral kidneys enhance symmetrically. There is a 2.6 cm fluid density lesion within the right kidney that likely represents a simple renal cyst. No hydronephrosis. No hydroureter. The urinary bladder is unremarkable. Stomach/Bowel: Stomach is within normal limits. No evidence of small bowel wall thickening or dilatation. Diffuse colonic diverticulosis. Sigmoid bowel wall thickening with associated trace pericolonic fat stranding. No intramural abscess formation. Appendix appears normal. Vascular/Lymphatic: No abdominal aorta aneurysm. Aneurysmal dilatation of bilateral common iliac arteries measuring up to 1.7 cm bilaterally. Severe atherosclerotic plaque of the aorta and its branches. No abdominal, pelvic, or inguinal lymphadenopathy. Reproductive: Prostate is unremarkable. Other: No  intraperitoneal free fluid. No intraperitoneal free gas. No organized fluid collection. Musculoskeletal: Small fat containing umbilical hernia with an abdominal wall defect of 1.3 cm. Levoscoliosis of the lumbar spine centered at the L3-L4 level. No suspicious lytic or blastic osseous lesions. No acute displaced fracture. Multilevel degenerative changes of the spine. IMPRESSION: 1. Diffuse colonic diverticulosis with uncomplicated sigmoid diverticulitis. No abscess formation or bowel perforation. Recommend colonoscopy status post treatment and status post complete resolution of inflammatory changes to evaluate for an underlying lesion as a malignancy cannot be fully excluded. 2. Bilateral common iliac artery aneurysm measuring up to 1.7 cm. 3. Findings suggestive of chronic pancreatitis. 4.  Aortic Atherosclerosis (ICD10-I70.0). Electronically Signed   By: Iven Finn M.D.   On: 01/02/2021 17:33   US Venous Img Lower Bilateral (DVT)  Result Date: 01/03/2021 CLINICAL DATA:  Bilateral lower extremity pain EXAM: For bilateral LOWER EXTREMITY VENOUS DOPPLER ULTRASOUND TECHNIQUE: Gray-scale sonography with compression, as well as color and duplex ultrasound, were performed to evaluate the deep venous system(s) from the level of the common femoral vein through the popliteal and proximal calf veins. COMPARISON:  None. FINDINGS: VENOUS Normal compressibility of the common femoral, superficial femoral, and popliteal veins, as well as the visualized calf veins. Visualized portions of profunda femoral vein and great saphenous vein unremarkable. No filling defects to suggest DVT on grayscale or color Doppler imaging. Doppler waveforms show normal direction of venous flow, normal respiratory plasticity and response to augmentation. Limited views of the contralateral common femoral vein are unremarkable. OTHER None. Limitations: none IMPRESSION: Negative. Electronically Signed   By: Prudencio Pair M.D.   On: 01/03/2021  01:07   DG Chest Port 1 View  Result Date: 01/02/2021 CLINICAL DATA:  Coronary artery disease, history of myocardial infarction. Presenting with lower abdominal pain. EXAM: PORTABLE CHEST 1 VIEW COMPARISON:  Chest x-ray 12/30/2017, CT chest 04/19/2007 FINDINGS: Persistent cardiomegaly. Surgical changes related to cardiac surgery. The heart size and mediastinal contours are otherwise unchanged. Aortic arch calcifications. Left lower lobe opacity. Increased interstitial markings. Suggestion of bilateral, left greater than right, at least trace to small volume pleural effusions. No pneumothorax. No acute osseous abnormality. Redemonstration of sternotomy wires that appear fractured. IMPRESSION: 1. Suggestion of bilateral, left greater than right, at least trace to small volume pleural effusions. 2. Mild pulmonary edema in the setting of cardiomegaly with underlying infection/inflammation not excluded. 3. Followup PA and lateral chest X-ray is recommended in 3-4 weeks following therapy to ensure resolution and exclude underlying malignancy. Electronically Signed   By: Iven Finn M.D.   On: 01/02/2021 21:36        Scheduled Meds: . amiodarone  400 mg Oral BID  . digoxin  0.25 mg Intravenous Once  .  insulin aspart  0-15 Units Subcutaneous TID WC  . insulin glargine  16 Units Subcutaneous QHS  . metoprolol tartrate  12.5 mg Oral BID  . pantoprazole (PROTONIX) IV  40 mg Intravenous Q12H  . pravastatin  40 mg Oral q1800  . vitamin B-12  1,000 mcg Oral Daily   Continuous Infusions: . cefTRIAXone (ROCEPHIN)  IV Stopped (01/03/21 0122)  . metronidazole Stopped (01/03/21 0930)     LOS: 1 day    Time spent: 25 minutes    Sidney Ace, MD [facility] Pager [phone]  If 7PM-7AM, please contact night-coverage 01/03/2021, 2:21 PM

## 2021-01-03 NOTE — ED Notes (Signed)
RN entered room to find patient sitting on end of bed. Patient assisted with getting back into bed and positioned for comfort. Patient assisted with urinal and urine specimen obtained and sent to lab. Cardiac monitor put back into place. 2 warm blankets provided and placed on patient. Patient denies pain or further needs at this time. Pt currently in bed with stretcher low and locked, side rails raised x2, and call bell in reach.

## 2021-01-03 NOTE — ED Notes (Signed)
Sent secure chat message to admitting NP for orders regarding HR 132 at this time. Awaiting response.

## 2021-01-03 NOTE — Consult Note (Signed)
Cardiology Consultation Note    Patient ID: Michael Simon, MRN: [id number], DOB/AGE: 09/11/1933 87 y.o. Admit date: 01/02/2021   Date of Consult: 01/03/2021 Primary Physician: [name], MD Primary Cardiologist: Deion Swift remotely  Chief Complaint: right lower quadrant abdominal pain Reason for Consultation: aflutter Requesting MD: Dr. Priscella Mann  HPI: Michael Simon is a 87 y.o. male with history of coronary artery disease status post coronary artery bypass grafting, hypertension, hyperlipidemia with chronic left bundle branch block.  He has done well from a cardiac standpoint.  He presented to the emergency room after severe right lower quadrant pain in his abdomen.  On arrival he was noted to be tachycardic.  EKG revealed tachycardia with rates in the 130s to 140s range with his chronic left bundle branch block.  He was given IV amiodarone with a transient drop in blood pressure.  He was also given IV digoxin.  He really remains 1 30-1 40.  Blood pressure is soft.  He denies chest pain or shortness of breath.  Feels back to baseline and is anxious to go home.  He was taken off the IV amiodarone due to his pressure.  Abdominal evaluation revealed diffuse colonic diverticulosis with uncomplicated sigmoid diverticulitis.  Also probably chronic pancreatitis.  Chest x-ray revealed probable mild pulmonary edema.  Lower extremity ultrasound negative for DVT.  CT revealed cardiomegaly but no pulmonary embolus.  Past Medical History:  Diagnosis Date  . CAD (coronary artery disease)   . HLD (hyperlipidemia)   . HTN (hypertension)   . LBBB (left bundle branch block)   . Myocardial infarction ([place])   . Osteoarthritis   . Type II diabetes mellitus ([place])       Surgical History:  Past Surgical History:  Procedure Laterality Date  . CARDIAC CATHETERIZATION    . CATARACT EXTRACTION    . COLONOSCOPY    . COLONOSCOPY WITH PROPOFOL N/A 08/26/2019   Procedure: COLONOSCOPY WITH PROPOFOL;   Surgeon: [name], MD;  Location: [facility] ENDOSCOPY;  Service: Gastroenterology;  Laterality: N/A;  . COLONOSCOPY WITH PROPOFOL N/A 09/22/2019   Procedure: COLONOSCOPY WITH PROPOFOL;  Surgeon: [name], MD;  Location: [facility] ENDOSCOPY;  Service: Gastroenterology;  Laterality: N/A;  pediatric colonoscope needed  . CORONARY ARTERY BYPASS GRAFT  2012  . EYE SURGERY    . INGUINAL HERNIA REPAIR    . JOINT REPLACEMENT     knees     Home Meds: Prior to Admission medications   Medication Sig Start Date End Date Taking? Authorizing Provider  Cyanocobalamin 1000 MCG TBCR Take 1,000 mcg by mouth daily.    Yes [provider]  lisinopril (ZESTRIL) 30 MG tablet Take 1 tablet (30 mg total) by mouth daily. 05/10/20  Yes Masoud, Viann Shove, MD  lovastatin (MEVACOR) 40 MG tablet Take 1 tablet (40 mg total) by mouth at bedtime. 08/16/20  Yes Masoud, Viann Shove, MD  pantoprazole (PROTONIX) 40 MG tablet TAKE 1 TABLET BY MOUTH EVERY DAY 08/10/20  Yes Masoud, Viann Shove, MD  TRESIBA FLEXTOUCH 100 UNIT/ML FlexTouch Pen Inject 16 Units into the skin as needed. When blood sugar is over 150 12/20/19  Yes [provider]    Inpatient Medications:  . amiodarone  400 mg Oral BID  . digoxin  0.25 mg Intravenous Once  . insulin aspart  0-15 Units Subcutaneous TID WC  . insulin glargine  16 Units Subcutaneous QHS  . metoprolol tartrate  12.5 mg Oral BID  . pantoprazole (PROTONIX) IV  40 mg Intravenous Q12H  .  pravastatin  40 mg Oral q1800  . vitamin B-12  1,000 mcg Oral Daily   . cefTRIAXone (ROCEPHIN)  IV Stopped (01/03/21 0122)  . metronidazole 500 mg (01/03/21 8341)    Allergies: No Known Allergies  Social History   Socioeconomic History  . Marital status: Married    Spouse name: Not on file  . Number of children: Not on file  . Years of education: Not on file  . Highest education level: Not on file  Occupational History  . Not on file  Tobacco Use  . Smoking status: Former Smoker    Quit date:  03/03/2008    Years since quitting: 12.8  . Smokeless tobacco: Never Used  Vaping Use  . Vaping Use: Never used  Substance and Sexual Activity  . Alcohol use: No    Alcohol/week: 0.0 standard drinks  . Drug use: No  . Sexual activity: Not on file  Other Topics Concern  . Not on file  Social History Narrative  . Not on file   Social Determinants of Health   Financial Resource Strain: Not on file  Food Insecurity: Not on file  Transportation Needs: Not on file  Physical Activity: Not on file  Stress: Not on file  Social Connections: Not on file  Intimate Partner Violence: Not on file     Family History  Problem Relation Age of Onset  . Arthritis Mother   . Heart disease Mother   . Heart disease Other      Review of Systems: A 12-system review of systems was performed and is negative except as noted in the HPI.  Labs: No results for input(s): CKTOTAL, CKMB, TROPONINI in the last 72 hours. Lab Results  Component Value Date   WBC 11.2 (H) 01/03/2021   HGB 10.7 (L) 01/03/2021   HCT 33.8 (L) 01/03/2021   MCV 96.0 01/03/2021   PLT 153 01/03/2021    Recent Labs  Lab 01/02/21 1538 01/02/21 2236 01/03/21 0513  NA 141   < > 144  K 4.1   < > 4.2  CL 102   < > 107  CO2 28   < > 26  BUN 28*   < > 25*  CREATININE 1.39*   < > 1.22  CALCIUM 9.0   < > 8.0*  PROT 8.2*  --   --   BILITOT 0.9  --   --   ALKPHOS 79  --   --   ALT 18  --   --   AST 20  --   --   GLUCOSE 310*   < > 120*   < > = values in this interval not displayed.   No results found for: CHOL, HDL, LDLCALC, TRIG No results found for: DDIMER  Radiology/Studies:  CT ANGIO CHEST PE W OR WO CONTRAST  Result Date: 01/03/2021 CLINICAL DATA:  Elevated D-dimer with pain EXAM: CT ANGIOGRAPHY CHEST WITH CONTRAST TECHNIQUE: Multidetector CT imaging of the chest was performed using the standard protocol during bolus administration of intravenous contrast. Multiplanar CT image reconstructions and MIPs were obtained  to evaluate the vascular anatomy. CONTRAST:  22mL OMNIPAQUE IOHEXOL 350 MG/ML SOLN COMPARISON:  Chest radiograph January 02, 2021 FINDINGS: Cardiovascular: There is no demonstrable pulmonary embolus. There is no thoracic aortic aneurysm. No dissection is seen; note that the contrast bolus in the aorta is not sufficient for dissection assessment. There are foci of calcification in visualized great vessels. There are multiple foci of aortic atherosclerosis. There are  foci of native coronary artery calcification. Patient is status post coronary artery bypass grafting. There is a degree of cardiomegaly. Mediastinum/Nodes: Thyroid is diminutive without focal lesion. There is a precarinal lymph node measuring 1.4 x 1.2 cm. There are several scattered subcentimeter mediastinal lymph nodes as well. No esophageal lesions are appreciable. Lungs/Pleura: There is underlying centrilobular and paraseptal emphysematous change with scattered areas of peripheral fibrosis. There is suspected scarring in the anterior left apex. There is a free-flowing right pleural effusion with atelectasis and mild consolidation in the right lower lobe. There is peripheral lobular septal thickening in the lower lobes, suspicious for a degree of interstitial edema. Upper Abdomen: There is reflux of contrast into the inferior vena cava and hepatic veins. There is a cyst near the junction of the right and left lobes of the liver measuring 3.2 x 2.0 cm. Gallbladder is absent. There is extensive upper abdominal aortic and visualized great vessel atherosclerosis. Musculoskeletal: Patient is status post median sternotomy. Sternal separation noted throughout the sternum despite wire fixation. No blastic or lytic bone lesions. No evident chest wall lesions. Review of the MIP images confirms the above findings. IMPRESSION: 1. No demonstrable pulmonary embolus. No thoracic aortic aneurysm. No dissection seen with caveat that there is insufficient contrast  within the aorta to assess for potential dissection. There is aortic atherosclerosis as well as foci of great vessel and native coronary artery calcification. Patient is status post coronary artery bypass grafting. There is cardiomegaly. 2. Right pleural effusion with right base atelectasis and suspected small focus of pneumonia right base posteriorly. 3. Underlying emphysematous change with scarring and fibrotic type change, primarily in the upper lobe regions. 4. Suspect lower lobe interstitial pulmonary edema. There may well be a degree of congestive heart failure. 5. Reflux of contrast into the inferior vena cava and hepatic veins, a finding that likely is indicative of increased right heart pressure. 6.  Sternal separation despite wire fixation. 7.  Gallbladder absent. Aortic Atherosclerosis (ICD10-I70.0) and Emphysema (ICD10-J43.9). Electronically Signed   By: Lowella Grip III M.D.   On: 01/03/2021 07:52   CT Abdomen Pelvis W Contrast  Result Date: 01/02/2021 CLINICAL DATA:  Right lower quadrant pain that started today. EXAM: CT ABDOMEN AND PELVIS WITH CONTRAST TECHNIQUE: Multidetector CT imaging of the abdomen and pelvis was performed using the standard protocol following bolus administration of intravenous contrast. CONTRAST:  138mL OMNIPAQUE IOHEXOL 300 MG/ML  SOLN COMPARISON:  None. FINDINGS: Lower chest: Interval development of dependent cystic changes at the right base. Coronary artery calcifications. Hepatobiliary: Similar-appearing multiloculated fluid density lesion within the liver measuring simple fluid density and likely representing a simple hepatic cyst. Redemonstration of several subcentimeter/pericentimeter hypodensities are too small to characterize. Gallbladder is unremarkable. No biliary ductal dilatation. Pancreas: Pancreatic parenchyma calcifications scattered. No focal pancreatic lesion. Normal pancreatic contour with no definite inflammatory changes surrounding it. No main  pancreatic Normal in size without focal abnormality. duct dilatation. Spleen: Normal in size without focal abnormality. Adrenals/Urinary Tract: No adrenal nodule bilaterally. Bilateral kidneys enhance symmetrically. There is a 2.6 cm fluid density lesion within the right kidney that likely represents a simple renal cyst. No hydronephrosis. No hydroureter. The urinary bladder is unremarkable. Stomach/Bowel: Stomach is within normal limits. No evidence of small bowel wall thickening or dilatation. Diffuse colonic diverticulosis. Sigmoid bowel wall thickening with associated trace pericolonic fat stranding. No intramural abscess formation. Appendix appears normal. Vascular/Lymphatic: No abdominal aorta aneurysm. Aneurysmal dilatation of bilateral common iliac arteries measuring up to 1.7 cm  bilaterally. Severe atherosclerotic plaque of the aorta and its branches. No abdominal, pelvic, or inguinal lymphadenopathy. Reproductive: Prostate is unremarkable. Other: No intraperitoneal free fluid. No intraperitoneal free gas. No organized fluid collection. Musculoskeletal: Small fat containing umbilical hernia with an abdominal wall defect of 1.3 cm. Levoscoliosis of the lumbar spine centered at the L3-L4 level. No suspicious lytic or blastic osseous lesions. No acute displaced fracture. Multilevel degenerative changes of the spine. IMPRESSION: 1. Diffuse colonic diverticulosis with uncomplicated sigmoid diverticulitis. No abscess formation or bowel perforation. Recommend colonoscopy status post treatment and status post complete resolution of inflammatory changes to evaluate for an underlying lesion as a malignancy cannot be fully excluded. 2. Bilateral common iliac artery aneurysm measuring up to 1.7 cm. 3. Findings suggestive of chronic pancreatitis. 4.  Aortic Atherosclerosis (ICD10-I70.0). Electronically Signed   By: Iven Finn M.D.   On: 01/02/2021 17:33   US Venous Img Lower Bilateral (DVT)  Result Date:  01/03/2021 CLINICAL DATA:  Bilateral lower extremity pain EXAM: For bilateral LOWER EXTREMITY VENOUS DOPPLER ULTRASOUND TECHNIQUE: Gray-scale sonography with compression, as well as color and duplex ultrasound, were performed to evaluate the deep venous system(s) from the level of the common femoral vein through the popliteal and proximal calf veins. COMPARISON:  None. FINDINGS: VENOUS Normal compressibility of the common femoral, superficial femoral, and popliteal veins, as well as the visualized calf veins. Visualized portions of profunda femoral vein and great saphenous vein unremarkable. No filling defects to suggest DVT on grayscale or color Doppler imaging. Doppler waveforms show normal direction of venous flow, normal respiratory plasticity and response to augmentation. Limited views of the contralateral common femoral vein are unremarkable. OTHER None. Limitations: none IMPRESSION: Negative. Electronically Signed   By: Prudencio Pair M.D.   On: 01/03/2021 01:07   DG Chest Port 1 View  Result Date: 01/02/2021 CLINICAL DATA:  Coronary artery disease, history of myocardial infarction. Presenting with lower abdominal pain. EXAM: PORTABLE CHEST 1 VIEW COMPARISON:  Chest x-ray 12/30/2017, CT chest 04/19/2007 FINDINGS: Persistent cardiomegaly. Surgical changes related to cardiac surgery. The heart size and mediastinal contours are otherwise unchanged. Aortic arch calcifications. Left lower lobe opacity. Increased interstitial markings. Suggestion of bilateral, left greater than right, at least trace to small volume pleural effusions. No pneumothorax. No acute osseous abnormality. Redemonstration of sternotomy wires that appear fractured. IMPRESSION: 1. Suggestion of bilateral, left greater than right, at least trace to small volume pleural effusions. 2. Mild pulmonary edema in the setting of cardiomegaly with underlying infection/inflammation not excluded. 3. Followup PA and lateral chest X-ray is recommended in  3-4 weeks following therapy to ensure resolution and exclude underlying malignancy. Electronically Signed   By: Iven Finn M.D.   On: 01/02/2021 21:36    Wt Readings from Last 3 Encounters:  01/02/21 79.4 kg  12/21/20 77.7 kg  12/07/20 77.9 kg    EKG: Wide-complex tachycardia with his chronic left bundle branch block probable atrial flutter.  Physical Exam: Caucasian male complaining of right lower quadrant abdominal pain on presentation currently pain-free. Blood pressure 98/64, pulse (!) 131, temperature 98.1 F (36.7 C), temperature source Oral, resp. rate (!) 24, height 5\' 9"  (1.753 m), weight 79.4 kg, SpO2 97 %. Body mass index is 25.84 kg/m. General: Well developed, well nourished, in no acute distress. Head: Normocephalic, atraumatic, sclera non-icteric, no xanthomas, nares are without discharge.  Neck: Negative for carotid bruits. JVD not elevated. Lungs: Clear bilaterally to auscultation without wheezes, rales, or rhonchi. Breathing is unlabored. Heart: Tachycardic Abdomen:  Soft, with mild right lower quadrant discomfort to deep palpation. Msk:  Strength and tone appear normal for age. Extremities: No clubbing or cyanosis. No edema.  Distal pedal pulses are 2+ and equal bilaterally. Neuro: Alert and oriented X 3. No facial asymmetry. No focal deficit. Moves all extremities spontaneously. Psych:  Responds to questions appropriately with a normal affect.     Assessment and Plan  87 year old male with history of coronary disease status post chordee bypass grafting, hypertension,-the anemia, mild memory disorder who presents emergency room with complaints of right lower quadrant abdominal pain.  Probable diverticulosis.  Complicated by probable atrial flutter.  1.  Atrial flutter.  Did not tolerate IV amiodarone due to pressure drop.  Will switch to p.o. 400 mg amiodarone twice daily and give a dose of IV digoxin x1 attempt to control rate.  Consideration for anticoagulation  will need to be addressed prior to discharge.  Relative anemia with a hemoglobin presentation.  Relative high risk for anticoagulation.  2.  Renal insufficiency-creatinine 1.22 today.  Appears to be near his baseline. 3.  Mild anemia-chronic.  No evidence of acute blood loss. 4.  Coronary artery disease-appears to rule out for myocardial infarction.  Mild troponin elevation likely secondary to demand.    Signed, Teodoro Spray MD 01/03/2021, 10:25 AM Pager: [phone]

## 2021-01-03 NOTE — ED Notes (Signed)
Pt given sprite at this time. Will recheck CBG in 1 hour

## 2021-01-03 NOTE — ED Notes (Signed)
Sent message via secure chat to admitting regarding HR 131 after blood transfusion.

## 2021-01-03 NOTE — ED Notes (Signed)
Pt pulse ox moved from finger to earlobe due to difficulties reading. This RN noted pt sat at 93-96% on 3L with a good pleth on monitor at this time.

## 2021-01-03 NOTE — ED Notes (Signed)
RN went for BGL check and found patient without oxygen in place and cardiac monitor removed. Trail of blood on floor where patient accidentally removed IV. Patient and IV site cleansed. Patient placed back in bed, cardiac monitor replaced, and patient provided with additional 3 warm blankets as pt said he moved from bed d/t being cold. RN ensured call bell had been in reach of patient and re-educated patient on usage. Patient placed in reverse trendelenburg with knees elevated. Side rails raised x2 and bed alarm put into use.

## 2021-01-03 NOTE — ED Notes (Addendum)
Pt given meal tray at this time. Pt also given sandwich tray to attempt and advance diet, per order, at this time. Will monitor how pt tolerates.

## 2021-01-03 NOTE — Evaluation (Signed)
Physical Therapy Evaluation Patient Details Name: Michael Simon MRN: [id number] DOB: [birth year] Today's Date: 01/03/2021   History of Present Illness  Pt is an 87 y.o. male with medical history significant of CAD s/p MI, HLD, LBBB, DMII , HTN who presents to ED with complaint of lower abdominal pain. Pt noted to be in atrial flutter.    Clinical Impression  Pt alert, seated in bed in upright position, did report discomfort due to his back as well as being frustrated that he hasn't been moving around much. Pt stated at baseline he is independent for ADLs, does not use DME, lives with his daughter who assists with IADLs as needed. Denied any falls in the last 6 months.  Pt HR in 120s-low 130s throughout session, monitored continuously. Difficulty obtaining clear spO2 readings, 3 finger sites attempted with fair results, pt on 4L throughout. Pt denied symptoms but did appear mildly SOB after standing activities. Supine <> sit with supervision and extended time. Good sitting balance noted, and sit <> stand with supervision, pt reliant on UE support to complete safely. He was able to take several steps at EOB (forward/backwards/laterally) with handheld assist but due to elevated HR and poor spO2 readings further mobility deferred. RN notified of pt status and spO2 readings.  Overall the patient demonstrated deficits (see "PT Problem List") that impede the patient's functional abilities, safety, and mobility and would benefit from skilled PT intervention. Recommendation is HHPT with supervision for mobility/OOB pending further pt progress with mobility.     Follow Up Recommendations Home health PT;Supervision for mobility/OOB    Equipment Recommendations  None recommended by PT    Recommendations for Other Services       Precautions / Restrictions Precautions Precautions: Fall Restrictions Weight Bearing Restrictions: No      Mobility  Bed Mobility Overal bed mobility: Needs  Assistance Bed Mobility: Supine to Sit;Sit to Supine   Sidelying to sit: Supervision;HOB elevated Supine to sit: Supervision;HOB elevated     General bed mobility comments: extended time needed for effort but no physical assist needed    Transfers Overall transfer level: Needs assistance Equipment used: 1 person hand held assist Transfers: Sit to/from Stand Sit to Stand: Supervision         General transfer comment: Pt able to stand without support from PT but reliance on UE support for lifting and steadying. provided with handheld assist  Ambulation/Gait             General Gait Details: true ambulation deferred due to difficulty obtaining spO2 readings and HR in 120s-low 130s with standing  Stairs            Wheelchair Mobility    Modified Rankin (Stroke Patients Only)       Balance Overall balance assessment: Needs assistance Sitting-balance support: Bilateral upper extremity supported Sitting balance-Leahy Scale: Good       Standing balance-Leahy Scale: Fair Standing balance comment: pt more comfortable with UE support                             Pertinent Vitals/Pain Pain Assessment: Faces Faces Pain Scale: Hurts a little bit Pain Location: back pain Pain Descriptors / Indicators: Aching Pain Intervention(s): Repositioned;Monitored during session;Limited activity within patient's tolerance    Home Living Family/patient expects to be discharged to:: Private residence Living Arrangements: Children (daughter works 3-7pm) Available Help at Discharge: Family;Available 24 hours/day Type of Home: House Home Access: Stairs  to enter Entrance Stairs-Rails: Right Entrance Stairs-Number of Steps: 3 Home Layout: One level Home Equipment: Grab bars - toilet;Grab bars - tub/shower;Walker - 2 wheels;Shower seat;Cane - single point;Walker - 4 wheels      Prior Function Level of Independence: Independent         Comments: ADLs and light  IADLs, pt daughters assist with cooking/cleaning     Hand Dominance   Dominant Hand: Right    Extremity/Trunk Assessment   Upper Extremity Assessment Upper Extremity Assessment: Generalized weakness (able to lift against gravity)    Lower Extremity Assessment Lower Extremity Assessment: Generalized weakness (able to lift against gravity)    Cervical / Trunk Assessment Cervical / Trunk Assessment: Normal  Communication   Communication: No difficulties  Cognition Arousal/Alertness: Awake/alert Behavior During Therapy: WFL for tasks assessed/performed Overall Cognitive Status: Within Functional Limits for tasks assessed                                        General Comments      Exercises Other Exercises Other Exercises: pt able to sit to stand, and take several steps forwards/backwards/laterally at EOB. limited by vitals, pt may have been mildly SOB with activity as well but denied symptoms.   Assessment/Plan    PT Assessment Patient needs continued PT services  PT Problem List Decreased strength;Decreased balance;Decreased activity tolerance;Decreased mobility       PT Treatment Interventions DME instruction;Therapeutic exercise;Gait training;Balance training;Stair training;Neuromuscular re-education;Functional mobility training;Therapeutic activities;Patient/family education    PT Goals (Current goals can be found in the Care Plan section)  Acute Rehab PT Goals Patient Stated Goal: to go home today PT Goal Formulation: With patient Time For Goal Achievement: 01/17/21 Potential to Achieve Goals: Good    Frequency Min 2X/week   Barriers to discharge        Co-evaluation               AM-PAC PT "6 Clicks" Mobility  Outcome Measure Help needed turning from your back to your side while in a flat bed without using bedrails?: A Little Help needed moving from lying on your back to sitting on the side of a flat bed without using bedrails?: A  Little Help needed moving to and from a bed to a chair (including a wheelchair)?: A Little Help needed standing up from a chair using your arms (e.g., wheelchair or bedside chair)?: A Little Help needed to walk in hospital room?: A Little Help needed climbing 3-5 steps with a railing? : A Lot 6 Click Score: 17    End of Session   Activity Tolerance: Patient tolerated treatment well;Treatment limited secondary to medical complications (Comment) (elevated HR, poor spO2 readings) Patient left: in bed;with call bell/phone within reach;with family/visitor present Nurse Communication: Mobility status PT Visit Diagnosis: Other abnormalities of gait and mobility (R26.89);Muscle weakness (generalized) (M62.81);Difficulty in walking, not elsewhere classified (R26.2)    Time: 6283-6629 PT Time Calculation (min) (ACUTE ONLY): 44 min   Charges:   PT Evaluation $PT Eval Low Complexity: 1 Low PT Treatments $Therapeutic Exercise: 23-37 mins       Lieutenant Diego PT, DPT 2:55 PM,01/03/21

## 2021-01-03 NOTE — ED Notes (Signed)
Pt pulse ox changed out due to difficulties reading. This RN noted pt sat at 93-96% on 3L after moving pulse ox.

## 2021-01-03 NOTE — ED Notes (Signed)
Pt assisted from toilet to bench in room, per request for comfort. Pt tolerated well. Pt hooked back up to monitor at this time, NAD noted at this time.

## 2021-01-03 NOTE — Progress Notes (Signed)
Inpatient Diabetes Program Recommendations  AACE/ADA: New Consensus Statement on Inpatient Glycemic Control (2015)  Target Ranges:  Prepandial:   less than 140 mg/dL      Peak postprandial:   less than 180 mg/dL (1-2 hours)      Critically ill patients:  140 - 180 mg/dL   Results for GERAN, HAITHCOCK (MRN [id number]) as of 01/03/2021 08:54  Ref. Range 01/03/2021 08:24  Glucose-Capillary Latest Ref Range: 70 - 99 mg/dL 79  Results for CAESAR, MANNELLA (MRN [id number]) as of 01/03/2021 08:54  Ref. Range 01/02/2021 15:38 01/02/2021 22:36 01/03/2021 05:13  Glucose Latest Ref Range: 70 - 99 mg/dL 310 (H) 168 (H) 120 (H)   Review of Glycemic Control  Diabetes history: DM2 Outpatient Diabetes medications: Tresiba 16 units as needed if glucose >150 mg/dl Current orders for Inpatient glycemic control: Lantus 16 units QHS, Novolog 0-15 units TID with meals  Inpatient Diabetes Program Recommendations:    Insulin: Please consider decreasing Lantus to 8 units QHS.  Thanks, Barnie Alderman, RN, MSN, CDE Diabetes Coordinator Inpatient Diabetes Program [phone] (Team Pager from 8am to 5pm)

## 2021-01-03 NOTE — ED Notes (Signed)
Pt assisted to toilet at this time, pt tolerated well.

## 2021-01-03 NOTE — ED Notes (Signed)
Pharmacy contacted requesting Pravastatin to be tubed for RN to administer.

## 2021-01-04 ENCOUNTER — Inpatient Hospital Stay: Payer: Medicare HMO

## 2021-01-04 ENCOUNTER — Encounter: Payer: Self-pay | Admitting: Internal Medicine

## 2021-01-04 DIAGNOSIS — K5792 Diverticulitis of intestine, part unspecified, without perforation or abscess without bleeding: Secondary | ICD-10-CM | POA: Diagnosis present

## 2021-01-04 LAB — CBC WITH DIFFERENTIAL/PLATELET
Abs Immature Granulocytes: 0.04 10*3/uL (ref 0.00–0.07)
Basophils Absolute: 0 10*3/uL (ref 0.0–0.1)
Basophils Relative: 0 %
Eosinophils Absolute: 0 10*3/uL (ref 0.0–0.5)
Eosinophils Relative: 0 %
HCT: 28.3 % — ABNORMAL LOW (ref 39.0–52.0)
Hemoglobin: 8.7 g/dL — ABNORMAL LOW (ref 13.0–17.0)
Immature Granulocytes: 0 %
Lymphocytes Relative: 6 %
Lymphs Abs: 0.6 10*3/uL — ABNORMAL LOW (ref 0.7–4.0)
MCH: 31 pg (ref 26.0–34.0)
MCHC: 30.7 g/dL (ref 30.0–36.0)
MCV: 100.7 fL — ABNORMAL HIGH (ref 80.0–100.0)
Monocytes Absolute: 0.7 10*3/uL (ref 0.1–1.0)
Monocytes Relative: 7 %
Neutro Abs: 8.1 10*3/uL — ABNORMAL HIGH (ref 1.7–7.7)
Neutrophils Relative %: 87 %
Platelets: 156 10*3/uL (ref 150–400)
RBC: 2.81 MIL/uL — ABNORMAL LOW (ref 4.22–5.81)
RDW: 14.4 % (ref 11.5–15.5)
WBC: 9.4 10*3/uL (ref 4.0–10.5)
nRBC: 0 % (ref 0.0–0.2)

## 2021-01-04 LAB — TYPE AND SCREEN
ABO/RH(D): O POS
Antibody Screen: NEGATIVE
Unit division: 0

## 2021-01-04 LAB — BPAM RBC
Blood Product Expiration Date: 202202152359
ISSUE DATE / TIME: 202201170136
Unit Type and Rh: 5100

## 2021-01-04 LAB — BASIC METABOLIC PANEL
Anion gap: 13 (ref 5–15)
BUN: 29 mg/dL — ABNORMAL HIGH (ref 8–23)
CO2: 22 mmol/L (ref 22–32)
Calcium: 8.3 mg/dL — ABNORMAL LOW (ref 8.9–10.3)
Chloride: 108 mmol/L (ref 98–111)
Creatinine, Ser: 1.56 mg/dL — ABNORMAL HIGH (ref 0.61–1.24)
GFR, Estimated: 43 mL/min — ABNORMAL LOW (ref >60)
Glucose, Bld: 90 mg/dL (ref 70–99)
Potassium: 5.1 mmol/L (ref 3.5–5.1)
Sodium: 143 mmol/L (ref 135–145)

## 2021-01-04 LAB — MAGNESIUM: Magnesium: 2.4 mg/dL (ref 1.7–2.4)

## 2021-01-04 LAB — GLUCOSE, CAPILLARY: Glucose-Capillary: 107 mg/dL — ABNORMAL HIGH (ref 70–99)

## 2021-01-04 LAB — PROCALCITONIN: Procalcitonin: <0.1 ng/mL

## 2021-01-04 LAB — HEMOGLOBIN: Hemoglobin: 10.5 g/dL — ABNORMAL LOW (ref 13.0–17.0)

## 2021-01-04 LAB — CBG MONITORING, ED
Glucose-Capillary: 69 mg/dL — ABNORMAL LOW (ref 70–99)
Glucose-Capillary: 102 mg/dL — ABNORMAL HIGH (ref 70–99)

## 2021-01-04 MED ORDER — AMIODARONE HCL 200 MG PO TABS
200.0000 mg | ORAL_TABLET | Freq: Two times a day (BID) | ORAL | Status: DC
  Administered 2021-01-04 – 2021-01-06 (×4): 200 mg via ORAL
  Filled 2021-01-04 (×5): qty 1

## 2021-01-04 MED ORDER — ALBUMIN HUMAN 25 % IV SOLN
12.5000 g | Freq: Once | INTRAVENOUS | Status: AC
  Administered 2021-01-04: 12.5 g via INTRAVENOUS
  Filled 2021-01-04: qty 50

## 2021-01-04 MED ORDER — FUROSEMIDE 10 MG/ML IJ SOLN
40.0000 mg | Freq: Once | INTRAMUSCULAR | Status: AC
  Administered 2021-01-04: 40 mg via INTRAVENOUS
  Filled 2021-01-04 (×2): qty 4

## 2021-01-04 MED ORDER — INSULIN GLARGINE 100 UNIT/ML ~~LOC~~ SOLN
8.0000 [IU] | Freq: Every day | SUBCUTANEOUS | Status: DC
  Administered 2021-01-04: 8 [IU] via SUBCUTANEOUS
  Filled 2021-01-04 (×2): qty 0.08

## 2021-01-04 NOTE — Progress Notes (Signed)
Inpatient Diabetes Program Recommendations  AACE/ADA: New Consensus Statement on Inpatient Glycemic Control (2015)  Target Ranges:  Prepandial:   less than 140 mg/dL      Peak postprandial:   less than 180 mg/dL (1-2 hours)      Critically ill patients:  140 - 180 mg/dL   Lab Results  Component Value Date   GLUCAP 69 (L) 01/04/2021   HGBA1C 8.2 (H) 01/02/2021    Review of Glycemic Control Results for Michael Simon, Michael Simon (MRN [id number]) as of 01/04/2021 12:02  Ref. Range 01/03/2021 11:40 01/03/2021 12:34 01/03/2021 16:32 01/03/2021 23:35 01/04/2021 09:31  Glucose-Capillary Latest Ref Range: 70 - 99 mg/dL 54 (L) 61 (L) 67 (L) 115 (H) 69 (L)  Diabetes history: DM2 Outpatient Diabetes medications: Tresiba 16 units as needed if glucose >150 mg/dl Current orders for Inpatient glycemic control: Lantus 16 units QHS, Novolog 0-15 units TID with meals  Inpatient Diabetes Program Recommendations:    Please decrease Lantus to 8 units   Will continue to follow while inpatient.  Thank you, Reche Dixon, RN, BSN Diabetes Coordinator Inpatient Diabetes Program [phone] (team pager from 8a-5p)

## 2021-01-04 NOTE — Progress Notes (Signed)
Patient on Airborne Precautions. Attempted call to patient's room x 2 to discuss PT and OT recs, no answer. Attempted call to patient's son, no answer and voicemail not set up. Will try again tomorrow.  Oleh Genin, [place]

## 2021-01-04 NOTE — TOC Initial Note (Signed)
Transition of Care [facility]) - Initial/Assessment Note    Patient Details  Name: Michael Simon MRN: [id number] Date of Birth: 07/18/1933  Transition of Care [facility]) CM/SW Contact:    Magnus Ivan, LCSW Phone Number: 01/04/2021, 3:27 PM  Clinical Narrative:       CSW spoke to patient's son. Patient lives with daughter and children provide transportation to appointments. PCP is Dr. Lavera Guise. [facility]acy [facility]. No HH or SNF history. Son reported patient has DME from a family member including a 3 in 64. Patient and son agreeable to HHPT and OT recommendation. No agency preference. Referral accepted by [facility] Representative [place]. Son can pick up patient at time of discharge.                Expected Discharge Plan: [place] Barriers to Discharge: Continued Medical Work up   Patient Goals and CMS Choice Patient states their goals for this hospitalization and ongoing recovery are:: home with home health CMS Medicare.gov Compare Post Acute Care list provided to:: Patient Choice offered to / list presented to : [place]  Expected Discharge Plan and Services Expected Discharge Plan: [place]       Living arrangements for the past 2 months: [place]: PT,OT [place] Agency: [facility] [place] Date [facility] Agency Contacted: 01/04/21   Representative spoke with at [place]: Jana Half  Prior Living Arrangements/Services Living arrangements for the past 2 months: [place] Lives with:: Adult Children Patient language and need for interpreter reviewed:: Yes Do you feel safe going back to the place where you live?: Yes      Need for Family Participation in Patient Care: Yes (Comment) Care giver support system in place?: Yes (comment) Current home services: DME Criminal Activity/Legal Involvement Pertinent to Current Situation/Hospitalization: No - Comment as  needed  Activities of Daily Living Home Assistive Devices/Equipment: None ADL Screening (condition at time of admission) Patient's cognitive ability adequate to safely complete daily activities?: Yes Is the patient deaf or have difficulty hearing?: No Does the patient have difficulty seeing, even when wearing glasses/contacts?: Yes Does the patient have difficulty concentrating, remembering, or making decisions?: No Patient able to express need for assistance with ADLs?: Yes Does the patient have difficulty dressing or bathing?: No Independently performs ADLs?: Yes (appropriate for developmental age) Does the patient have difficulty walking or climbing stairs?: Yes Weakness of Legs: None Weakness of Arms/Hands: None  Permission Sought/Granted Permission sought to share information with : Chartered certified accountant granted to share information with : Yes, Verbal Permission Granted     Permission granted to share info w AGENCY: Home Health agencies        Emotional Assessment       Orientation: : Oriented to Self,Oriented to Place,Oriented to  Time,Oriented to Situation Alcohol / Substance Use: Not Applicable Psych Involvement: No (comment)  Admission diagnosis:  Atrial flutter (HCC) [I48.92] Cough [R05.9] Diverticulitis [K57.92] Leg pain [M79.606] Hypoxia [R09.02] Acute diverticulitis [K57.92] Atrial flutter, unspecified type [facility]) [I48.92] Patient Active Problem List   Diagnosis Date Noted  . Acute diverticulitis 01/04/2021  . Atrial flutter ([place]) 01/02/2021  . Acute bronchitis and bronchiolitis 12/21/2020  . Suspected COVID-19 virus infection 12/21/2020  . Annual physical exam 12/07/2020  . Paronychia of great toe,  right 04/23/2020  . Fever 05/30/2018  . Weakness 02/02/2018  . Abscess of right earlobe 02/02/2018  . Generalized weakness 02/02/2018  . Hematochezia 07/26/2015  . Diabetes mellitus ([place]) 07/26/2015  . Essential hypertension 07/26/2015   . HLD (hyperlipidemia) 07/26/2015  . CAD (coronary artery disease) 07/26/2015   PCP:  [name], MD Pharmacy:   [id number] [place] [place] - [address] [place] [address] Phone: [phone] Fax: [phone]     Social Determinants of Health (SDOH) Interventions    Readmission Risk Interventions No flowsheet data found.

## 2021-01-04 NOTE — Progress Notes (Signed)
PROGRESS NOTE    Michael Simon  NIO:[id number] DOB: [birth year] DOA: 01/02/2021 PCP: Michael Athens, MD   Brief Narrative:  87 y.o. male with medical history significant of CAD s/p MI, HLD, LBBB, DMII , HTN who presents to ED with complaint of lower abdominal  Pain on the right, described as sharp in nature that started acutely after lunch day of presentation. Patient notes no associated emesis, no diarrhea, no fever/ chills/ or sweats. He notes he was in his normal state of health prior to the onset of abdominal pain. He notes no cough , chest pain , palpitations , dysuria or presyncope.  He does  Have interim history of bronchitis 2-3 weeks ago but notes his symptoms have completely resolved.  He notes he is fully vaccinated for covid and has gotten his booster.   IV amiodarone attempted yesterday with intolerance due to drop in systolic blood pressure.  Seen by cardiology.  Transition to p.o. amiodarone and attempt to control ventricular rate.  No indication for gastroenterology evaluation at this time.  Case discussed with Dr. Marius Ditch.  Patient had a history of colonoscopy in 2020 without recommendations for repeat.  Received 1 unit of blood for hemoglobin 8.6.    No GI bleed noted however hemoglobin continues to drift down.  Likely secondary to acute diverticulitis.  Patient also now requiring 3 L of oxygen.  Chest x-ray reveals mild pulmonary edema.  I had a conversation with the patient and his son at bedside.  Explained reason for continued hospitalization.  From a cardiac standpoint patient is stable.  Amiodarone changed to oral by cardiology.  Case discussed with Dr. [name].  Defer anticoagulation at this time considering concern for ongoing GI bleed.  Can resume discussion as outpatient  Assessment & Plan:   Active Problems:   Atrial flutter (HCC)   Acute diverticulitis  Acute hypoxic respiratory failure Developed after admission Possibly indicative of acute pulmonary  edema Requiring 3 L currently No shortness of breath, normal work of breathing COVID-negative Plan: Lasix 40 mg IV x1 Ambulate, up in chair Wean oxygen as tolerated  Acute sigmoid diverticulitis Noted on CAT scan No abscess or perforation Patient hemodynamically stable Received PRBC for hemoglobin 8.6, responded initially now hemoglobin drifting down to 8.7 No overt GI bleed noted Plan: Soft diet Continue Rocephin and Flagyl No intravenous fluids As needed antiemetics Monitor hemoglobin.  Consider CT angio abdomen versus nuclear medicine bleeding scan if continued concern for occult GI bleeding Case discussed with Dr. Marius Ditch on 01/03/2021.  At that time hemoglobin was stable and no need for endoscopic evaluation  Atrial fibrillation/flutter with rapid ventricular response Initially attempted amiodarone bolus but patient had a drop in systolic blood pressure Seen by cardiology, recommendations appreciated From cardiology standpoint patient stable Plan: Telemetry monitoring Amiodarone p.o. 200 mg twice daily Defer anticoagulation for now given concern for GI bleed in the setting of diverticular disease.  This conversation can be resumed as outpatient  Acute anemia On p.o. ferrous sulfate as outpatient Hemoglobin dropped low of 8.6 No bleeding or dark stools noted Transfuse 1 unit packed red cells Follow-up anemia panel Consider IV iron Defer GI consultation for now  B12 deficiency Continue p.o. supplement  Hypertension Currently home ACE and metoprolol hold due to hypotension Restart as appropriate  Type 2 diabetes mellitus with hyperglycemia Basal bolus regimen Carb modified diet when appropriate Diabetes coordinator consult    DVT prophylaxis: SCDs Code Status: Full Family Communication: Son at bedside 01/04/2021  disposition Plan:Status  is: Inpatient  Remains inpatient appropriate because:Inpatient level of care appropriate due to severity of  illness   Dispo: The patient is from: Home              Anticipated d/c is to: Home              Anticipated d/c date is: 1 day              Patient currently is not medically stable to d/c.  Sigmoid diverticulitis, rapid atrial flutter.  Ventricular rate improved and stable from cardiology standpoint.  Patient developed a 3 L oxygen requirement today.  Will diurese and attempt to wean down.  Patient's hemoglobin also slowly drifting down.  Possible discharge in 24 hours if we are able to wean from supplemental oxygen and hemoglobin stable.  Patient seems to be tolerating p.o. intake.   Consultants:   Cardiology  Procedures:   None  Antimicrobials:  Flagyl Rocephin   Subjective: Patient seen and examined.  Resting in bed.  No visible distress.  Son at bedside.  Objective: Vitals:   01/04/21 1045 01/04/21 1100 01/04/21 1208 01/04/21 1234  BP:  (!) 141/72  105/72  Pulse: 74 61  79  Resp: 20 (!) 22  20  Temp:    97.9 F (36.6 C)  TempSrc:    Oral  SpO2:   92% 92%  Weight:      Height:        Intake/Output Summary (Last 24 hours) at 01/04/2021 1513 Last data filed at 01/04/2021 0746 Gross per 24 hour  Intake 295.17 ml  Output -  Net 295.17 ml   Filed Weights   01/02/21 1513  Weight: 79.4 kg    Examination:  General exam: Appears calm and comfortable  Respiratory system: Clear to auscultation. Respiratory effort normal. Cardiovascular system: Regular rate, irregular rhythm, no murmurs gastrointestinal system: Soft, mild diffuse tender to palpation, nondistended, normal bowel sounds Central nervous system: Alert and oriented. No focal neurological deficits. Extremities: Symmetric 5 x 5 power. Skin: No rashes, lesions or ulcers Psychiatry: Judgement and insight appear normal. Mood & affect appropriate.     Data Reviewed: I have personally reviewed following labs and imaging studies  CBC: Recent Labs  Lab 01/02/21 1538 01/02/21 2130 01/02/21 2236  01/03/21 0513 01/03/21 0919 01/03/21 1431 01/03/21 2011 01/04/21 0756  WBC 9.3 13.5*  --  11.2*  --   --   --  9.4  NEUTROABS 7.4  --   --   --   --   --   --  8.1*  HGB 11.1* 8.6* 9.0* 10.8* 10.7* 11.1* 9.7* 8.7*  HCT 35.0* 28.4* 29.0* 33.8*  --   --   --  28.3*  MCV 96.4 97.9  --  96.0  --   --   --  100.7*  PLT 189 148*  --  153  --   --   --  660   Basic Metabolic Panel: Recent Labs  Lab 01/02/21 1538 01/02/21 2236 01/03/21 0513 01/04/21 0756  NA 141 141 144 143  K 4.1 4.0 4.2 5.1  CL 102 106 107 108  CO2 28 25 26 22   GLUCOSE 310* 168* 120* 90  BUN 28* 25* 25* 29*  CREATININE 1.39* 1.34* 1.22 1.56*  CALCIUM 9.0 7.7* 8.0* 8.3*  MG  --  1.7  --  2.4  PHOS  --  3.6  --   --    GFR: Estimated Creatinine Clearance: 34.6 mL/min (  A) (by C-G formula based on SCr of 1.56 mg/dL (H)). Liver Function Tests: Recent Labs  Lab 01/02/21 1538  AST 20  ALT 18  ALKPHOS 79  BILITOT 0.9  PROT 8.2*  ALBUMIN 4.1   Recent Labs  Lab 01/02/21 1538  LIPASE 28   No results for input(s): AMMONIA in the last 168 hours. Coagulation Profile: No results for input(s): INR, PROTIME in the last 168 hours. Cardiac Enzymes: No results for input(s): CKTOTAL, CKMB, CKMBINDEX, TROPONINI in the last 168 hours. BNP (last 3 results) No results for input(s): PROBNP in the last 8760 hours. HbA1C: Recent Labs    01/02/21 2236  HGBA1C 8.2*   CBG: Recent Labs  Lab 01/03/21 1234 01/03/21 1632 01/03/21 2335 01/04/21 0931 01/04/21 1205  GLUCAP 61* 67* 115* 69* 102*   Lipid Profile: No results for input(s): CHOL, HDL, LDLCALC, TRIG, CHOLHDL, LDLDIRECT in the last 72 hours. Thyroid Function Tests: Recent Labs    01/02/21 1538  TSH 3.698   Anemia Panel: Recent Labs    01/02/21 2236  VITAMINB12 2,066*  FOLATE 14.3  FERRITIN 88  TIBC 263  IRON 25*  RETICCTPCT 1.9   Sepsis Labs: Recent Labs  Lab 01/02/21 2236 01/02/21 2309 01/03/21 0513 01/04/21 0756  PROCALCITON <0.10  --   <0.10 <0.10  LATICACIDVEN  --  1.5 1.3  --     Recent Results (from the past 240 hour(s))  SARS CORONAVIRUS 2 (TAT 6-24 HRS) Nasopharyngeal Nasopharyngeal Swab     Status: None   Collection Time: 01/02/21  8:51 PM   Specimen: Nasopharyngeal Swab  Result Value Ref Range Status   SARS Coronavirus 2 NEGATIVE NEGATIVE Final    Comment: (NOTE) SARS-CoV-2 target nucleic acids are NOT DETECTED.  The SARS-CoV-2 RNA is generally detectable in upper and lower respiratory specimens during the acute phase of infection. Negative results do not preclude SARS-CoV-2 infection, do not rule out co-infections with other pathogens, and should not be used as the sole basis for treatment or other patient management decisions. Negative results must be combined with clinical observations, patient history, and epidemiological information. The expected result is Negative.  Fact Sheet for Patients: SugarRoll.be  Fact Sheet for Healthcare Providers: https://www.woods-mathews.com/  This test is not yet approved or cleared by the [place] FDA and  has been authorized for detection and/or diagnosis of SARS-CoV-2 by FDA under an Emergency Use Authorization (EUA). This EUA will remain  in effect (meaning this test can be used) for the duration of the COVID-19 declaration under Se ction 564(b)(1) of the Act, 21 U.S.C. section 360bbb-3(b)(1), unless the authorization is terminated or revoked sooner.  Performed at [facility], [place] [address] [place], [place] [address]          Radiology Studies: CT ANGIO CHEST PE W OR WO CONTRAST  Result Date: 01/03/2021 CLINICAL DATA:  Elevated D-dimer with pain EXAM: CT ANGIOGRAPHY CHEST WITH CONTRAST TECHNIQUE: Multidetector CT imaging of the chest was performed using the standard protocol during bolus administration of intravenous contrast. Multiplanar CT image reconstructions and MIPs were obtained to evaluate  the vascular anatomy. CONTRAST:  58mL OMNIPAQUE IOHEXOL 350 MG/ML SOLN COMPARISON:  Chest radiograph January 02, 2021 FINDINGS: Cardiovascular: There is no demonstrable pulmonary embolus. There is no thoracic aortic aneurysm. No dissection is seen; note that the contrast bolus in the aorta is not sufficient for dissection assessment. There are foci of calcification in visualized great vessels. There are multiple foci of aortic atherosclerosis. There are  foci of native coronary artery calcification. Patient is status post coronary artery bypass grafting. There is a degree of cardiomegaly. Mediastinum/Nodes: Thyroid is diminutive without focal lesion. There is a precarinal lymph node measuring 1.4 x 1.2 cm. There are several scattered subcentimeter mediastinal lymph nodes as well. No esophageal lesions are appreciable. Lungs/Pleura: There is underlying centrilobular and paraseptal emphysematous change with scattered areas of peripheral fibrosis. There is suspected scarring in the anterior left apex. There is a free-flowing right pleural effusion with atelectasis and mild consolidation in the right lower lobe. There is peripheral lobular septal thickening in the lower lobes, suspicious for a degree of interstitial edema. Upper Abdomen: There is reflux of contrast into the inferior vena cava and hepatic veins. There is a cyst near the junction of the right and left lobes of the liver measuring 3.2 x 2.0 cm. Gallbladder is absent. There is extensive upper abdominal aortic and visualized great vessel atherosclerosis. Musculoskeletal: Patient is status post median sternotomy. Sternal separation noted throughout the sternum despite wire fixation. No blastic or lytic bone lesions. No evident chest wall lesions. Review of the MIP images confirms the above findings. IMPRESSION: 1. No demonstrable pulmonary embolus. No thoracic aortic aneurysm. No dissection seen with caveat that there is insufficient contrast within the aorta  to assess for potential dissection. There is aortic atherosclerosis as well as foci of great vessel and native coronary artery calcification. Patient is status post coronary artery bypass grafting. There is cardiomegaly. 2. Right pleural effusion with right base atelectasis and suspected small focus of pneumonia right base posteriorly. 3. Underlying emphysematous change with scarring and fibrotic type change, primarily in the upper lobe regions. 4. Suspect lower lobe interstitial pulmonary edema. There may well be a degree of congestive heart failure. 5. Reflux of contrast into the inferior vena cava and hepatic veins, a finding that likely is indicative of increased right heart pressure. 6.  Sternal separation despite wire fixation. 7.  Gallbladder absent. Aortic Atherosclerosis (ICD10-I70.0) and Emphysema (ICD10-J43.9). Electronically Signed   By: Lowella Grip III M.D.   On: 01/03/2021 07:52   CT Abdomen Pelvis W Contrast  Result Date: 01/02/2021 CLINICAL DATA:  Right lower quadrant pain that started today. EXAM: CT ABDOMEN AND PELVIS WITH CONTRAST TECHNIQUE: Multidetector CT imaging of the abdomen and pelvis was performed using the standard protocol following bolus administration of intravenous contrast. CONTRAST:  163mL OMNIPAQUE IOHEXOL 300 MG/ML  SOLN COMPARISON:  None. FINDINGS: Lower chest: Interval development of dependent cystic changes at the right base. Coronary artery calcifications. Hepatobiliary: Similar-appearing multiloculated fluid density lesion within the liver measuring simple fluid density and likely representing a simple hepatic cyst. Redemonstration of several subcentimeter/pericentimeter hypodensities are too small to characterize. Gallbladder is unremarkable. No biliary ductal dilatation. Pancreas: Pancreatic parenchyma calcifications scattered. No focal pancreatic lesion. Normal pancreatic contour with no definite inflammatory changes surrounding it. No main pancreatic Normal in  size without focal abnormality. duct dilatation. Spleen: Normal in size without focal abnormality. Adrenals/Urinary Tract: No adrenal nodule bilaterally. Bilateral kidneys enhance symmetrically. There is a 2.6 cm fluid density lesion within the right kidney that likely represents a simple renal cyst. No hydronephrosis. No hydroureter. The urinary bladder is unremarkable. Stomach/Bowel: Stomach is within normal limits. No evidence of small bowel wall thickening or dilatation. Diffuse colonic diverticulosis. Sigmoid bowel wall thickening with associated trace pericolonic fat stranding. No intramural abscess formation. Appendix appears normal. Vascular/Lymphatic: No abdominal aorta aneurysm. Aneurysmal dilatation of bilateral common iliac arteries measuring up to 1.7 cm  bilaterally. Severe atherosclerotic plaque of the aorta and its branches. No abdominal, pelvic, or inguinal lymphadenopathy. Reproductive: Prostate is unremarkable. Other: No intraperitoneal free fluid. No intraperitoneal free gas. No organized fluid collection. Musculoskeletal: Small fat containing umbilical hernia with an abdominal wall defect of 1.3 cm. Levoscoliosis of the lumbar spine centered at the L3-L4 level. No suspicious lytic or blastic osseous lesions. No acute displaced fracture. Multilevel degenerative changes of the spine. IMPRESSION: 1. Diffuse colonic diverticulosis with uncomplicated sigmoid diverticulitis. No abscess formation or bowel perforation. Recommend colonoscopy status post treatment and status post complete resolution of inflammatory changes to evaluate for an underlying lesion as a malignancy cannot be fully excluded. 2. Bilateral common iliac artery aneurysm measuring up to 1.7 cm. 3. Findings suggestive of chronic pancreatitis. 4.  Aortic Atherosclerosis (ICD10-I70.0). Electronically Signed   By: Iven Finn M.D.   On: 01/02/2021 17:33   US Venous Img Lower Bilateral (DVT)  Result Date: 01/03/2021 CLINICAL DATA:   Bilateral lower extremity pain EXAM: For bilateral LOWER EXTREMITY VENOUS DOPPLER ULTRASOUND TECHNIQUE: Gray-scale sonography with compression, as well as color and duplex ultrasound, were performed to evaluate the deep venous system(s) from the level of the common femoral vein through the popliteal and proximal calf veins. COMPARISON:  None. FINDINGS: VENOUS Normal compressibility of the common femoral, superficial femoral, and popliteal veins, as well as the visualized calf veins. Visualized portions of profunda femoral vein and great saphenous vein unremarkable. No filling defects to suggest DVT on grayscale or color Doppler imaging. Doppler waveforms show normal direction of venous flow, normal respiratory plasticity and response to augmentation. Limited views of the contralateral common femoral vein are unremarkable. OTHER None. Limitations: none IMPRESSION: Negative. Electronically Signed   By: Prudencio Pair M.D.   On: 01/03/2021 01:07   DG Chest Port 1 View  Result Date: 01/04/2021 CLINICAL DATA:  Hypoxia EXAM: PORTABLE CHEST 1 VIEW COMPARISON:  01/02/2021 FINDINGS: Similar probable mild pulmonary edema. Probable trace pleural effusions. No pneumothorax. Stable cardiomegaly. IMPRESSION: Probable mild pulmonary edema and trace pleural effusions similar to prior. Electronically Signed   By: Macy Mis M.D.   On: 01/04/2021 09:13   DG Chest Port 1 View  Result Date: 01/02/2021 CLINICAL DATA:  Coronary artery disease, history of myocardial infarction. Presenting with lower abdominal pain. EXAM: PORTABLE CHEST 1 VIEW COMPARISON:  Chest x-ray 12/30/2017, CT chest 04/19/2007 FINDINGS: Persistent cardiomegaly. Surgical changes related to cardiac surgery. The heart size and mediastinal contours are otherwise unchanged. Aortic arch calcifications. Left lower lobe opacity. Increased interstitial markings. Suggestion of bilateral, left greater than right, at least trace to small volume pleural effusions. No  pneumothorax. No acute osseous abnormality. Redemonstration of sternotomy wires that appear fractured. IMPRESSION: 1. Suggestion of bilateral, left greater than right, at least trace to small volume pleural effusions. 2. Mild pulmonary edema in the setting of cardiomegaly with underlying infection/inflammation not excluded. 3. Followup PA and lateral chest X-ray is recommended in 3-4 weeks following therapy to ensure resolution and exclude underlying malignancy. Electronically Signed   By: Iven Finn M.D.   On: 01/02/2021 21:36        Scheduled Meds: . amiodarone  200 mg Oral BID  . digoxin  0.25 mg Intravenous Once  . insulin aspart  0-15 Units Subcutaneous TID WC  . insulin glargine  8 Units Subcutaneous QHS  . metoprolol tartrate  12.5 mg Oral BID  . pantoprazole (PROTONIX) IV  40 mg Intravenous Q12H  . pravastatin  40 mg Oral q1800  .  vitamin B-12  1,000 mcg Oral Daily   Continuous Infusions: . cefTRIAXone (ROCEPHIN)  IV Stopped (01/03/21 2225)  . metronidazole Stopped (01/04/21 0859)     LOS: 2 days    Time spent: 25 minutes    Sidney Ace, MD [facility] Pager [phone]  If 7PM-7AM, please contact night-coverage 01/04/2021, 3:13 PM

## 2021-01-04 NOTE — Evaluation (Signed)
Occupational Therapy Evaluation Patient Details Name: Michael Simon MRN: [id number] DOB: 12-Dec-1933 Today's Date: 01/04/2021    History of Present Illness Pt is an 87 y.o. male with medical history significant of CAD s/p MI, HLD, LBBB, DMII , HTN who presents to ED with complaint of lower abdominal pain. Pt noted to be in atrial flutter.   Clinical Impression   Michael Simon was seen for OT evaluation this date. Prior to hospital admission, pt was Independent for mobility and ADLs. Pt lives c daughter who works in afternoons. Pt presents to acute OT demonstrating impaired ADL performance and functional mobility 2/2 decreased activity tolerance and functional strength/balance deficits. Pt currently requires MOD I don/doff B socks seated EOB. MIN A + HHA standing dressing tasks - requires BUE support for steady static standing. HR increased 112 upon sitting and 123 with standing, resolved quickly with rest. Pt would benefit from skilled OT to address noted impairments and functional limitations (see below for any additional details) in order to maximize safety and independence while minimizing falls risk and caregiver burden. Upon hospital discharge, recommend HHOT to maximize pt safety and return to functional independence during meaningful occupations of daily life.     Follow Up Recommendations  Home health OT;Supervision/Assistance - 24 hour    Equipment Recommendations  3 in 1 bedside commode    Recommendations for Other Services       Precautions / Restrictions Precautions Precautions: Fall Restrictions Weight Bearing Restrictions: No      Mobility Bed Mobility Overal bed mobility: Needs Assistance Bed Mobility: Supine to Sit     Supine to sit: Min assist     General bed mobility comments: Pt received and left up in chair    Transfers Overall transfer level: Needs assistance Equipment used: 1 person hand held assist Transfers: Sit to/from Stand Sit to Stand: Min  assist         General transfer comment: improved c BUE support    Balance Overall balance assessment: Needs assistance Sitting-balance support: Bilateral upper extremity supported Sitting balance-Leahy Scale: Good       Standing balance-Leahy Scale: Fair Standing balance comment: reliant on UE support this AM                           ADL either performed or assessed with clinical judgement   ADL Overall ADL's : Needs assistance/impaired                                       General ADL Comments: MOD I don/doff B socks seated EOB. MIN A + HHA standing dressing tasks - requires BUE support for steady static standing                  Pertinent Vitals/Pain Pain Assessment: No/denies pain Faces Pain Scale: No hurt     Hand Dominance Right   Extremity/Trunk Assessment Upper Extremity Assessment Upper Extremity Assessment: Generalized weakness   Lower Extremity Assessment Lower Extremity Assessment: Generalized weakness   Cervical / Trunk Assessment Cervical / Trunk Assessment: Normal   Communication Communication Communication: No difficulties   Cognition Arousal/Alertness: Awake/alert Behavior During Therapy: WFL for tasks assessed/performed Overall Cognitive Status: Within Functional Limits for tasks assessed  Exercises Exercises: Other exercises Other Exercises Other Exercises: Pt educated re: OT role, DME recs, d/c rec, falls prevention, ECS Other Exercises: LBD, sup<>sit, sit<>stand, ditting/standing balance/toelrance   Shoulder Instructions      Home Living Family/patient expects to be discharged to:: Private residence Living Arrangements: Children (daughter works 3-7 pm) Available Help at Discharge: Family;Available 24 hours/day Type of Home: House Home Access: Stairs to enter [employer] of Steps: 3 Entrance Stairs-Rails: Right Home Layout: One  level     Bathroom Shower/Tub: Teacher, early years/pre: Standard     Home Equipment: Grab bars - toilet;Grab bars - tub/shower;Walker - 2 wheels;Shower seat;Cane - single point;Walker - 4 wheels          Prior Functioning/Environment Level of Independence: Independent        Comments: ADLs and light IADLs, pt daughters assist with cooking/cleaning        OT Problem List: Decreased strength;Decreased activity tolerance;Impaired balance (sitting and/or standing);Decreased safety awareness      OT Treatment/Interventions: Self-care/ADL training;Therapeutic exercise;Energy conservation;DME and/or AE instruction;Therapeutic activities;Patient/family education;Balance training    OT Goals(Current goals can be found in the care plan section) Acute Rehab OT Goals Patient Stated Goal: to go home today OT Goal Formulation: With patient Time For Goal Achievement: 01/18/21 Potential to Achieve Goals: Good ADL Goals Pt Will Perform Grooming: with modified independence;standing (c LRAD PRN) Pt Will Perform Lower Body Dressing: with modified independence;sit to/from stand (c LRAD PRN) Pt Will Transfer to Toilet: with modified independence;ambulating;regular height toilet (c LRAD PRN)  OT Frequency: Min 2X/week    AM-PAC OT "6 Clicks" Daily Activity     Outcome Measure Help from another person eating meals?: None Help from another person taking care of personal grooming?: A Little Help from another person toileting, which includes using toliet, bedpan, or urinal?: A Little Help from another person bathing (including washing, rinsing, drying)?: A Little Help from another person to put on and taking off regular upper body clothing?: A Little Help from another person to put on and taking off regular lower body clothing?: A Little 6 Click Score: 19   End of Session    Activity Tolerance: Patient tolerated treatment well Patient left: in chair;with call bell/phone within  reach  OT Visit Diagnosis: Other abnormalities of gait and mobility (R26.89)                Time: 1057-1110 OT Time Calculation (min): 13 min Charges:  OT General Charges $OT Visit: 1 Visit OT Evaluation $OT Eval Low Complexity: 1 Low OT Treatments $Self Care/Home Management : 8-22 mins  Dessie Coma, M.S. OTR/L  01/04/21, 1:51 PM  ascom [phone]

## 2021-01-04 NOTE — ED Notes (Signed)
Dr Sreenath at bedside 

## 2021-01-04 NOTE — Progress Notes (Signed)
Physical Therapy Treatment Patient Details Name: Michael Simon MRN: [id number] DOB: [birth year] Today's Date: 01/04/2021    History of Present Illness Pt is an 87 y.o. male with medical history significant of CAD s/p MI, HLD, LBBB, DMII , HTN who presents to ED with complaint of lower abdominal pain. Pt noted to be in atrial flutter.    PT Comments    Patient is alert, sitting in bed. Pt assisted with gown change and linen change during session due to spilling his coffee. HR and spO2 monitored throughout, HR ranged from 90s - 120s during session, and responded appropriately to rest breaks. Pt on 4L throughout session, spO2 in 80s-90s, pt without complaints of lightheadedness when up in standing, just initial complaints when coming up into sitting. Supine to sit with minA (for weight shift at patient request). Fair sitting balance noted. He was able to stand pivot to recliner in room with CGA, and several bouts of sit <> stands and stepping performed. Pt progressed to stepping with CGA with bilateral UE support and reported improved confidence in movement. True ambulation deferred due to lines/leads and pts low blood sugar (69 with RN pricked finger). Pt assisted with breakfast set up and drinking of juice, all needs in reach at end of session. The patient would benefit from further skilled PT intervention to continue to progress towards goals. Recommendation remains appropriate, but upgraded to 24/7 supervision/assistance for increased safety. Pt does have a WC he can use at home as needed.      Follow Up Recommendations  Home health PT;Supervision/Assistance - 24 hour     Equipment Recommendations  None recommended by PT    Recommendations for Other Services       Precautions / Restrictions Precautions Precautions: Fall Restrictions Weight Bearing Restrictions: No    Mobility  Bed Mobility Overal bed mobility: Needs Assistance Bed Mobility: Supine to Sit     Supine to sit:  Min assist     General bed mobility comments: very light minA at patienr request for weight shift  Transfers Overall transfer level: Needs assistance Equipment used: 1 person hand held assist Transfers: Sit to/from Stand Sit to Stand: Supervision         General transfer comment: from EOB first attempt handheld assist and to pivot to recliner. with time and effort pt progressed to supervision from recliner and able to utilize BUE support  Ambulation/Gait             General Gait Details: true ambulation deferred due to pt fatigue and lines/leads in room, pt blood sugar also low (69 noted with RN finger prick during session)   Stairs             Wheelchair Mobility    Modified Rankin (Stroke Patients Only)       Balance Overall balance assessment: Needs assistance Sitting-balance support: Bilateral upper extremity supported Sitting balance-Leahy Scale: Good       Standing balance-Leahy Scale: Fair Standing balance comment: reliant on UE support this AM                            Cognition Arousal/Alertness: Awake/alert Behavior During Therapy: WFL for tasks assessed/performed Overall Cognitive Status: Within Functional Limits for tasks assessed  Exercises Other Exercises Other Exercises: Pt assisted with gown change, linen change, breakfast set up and removal of lids and assist to drink juice to address low blood sugar    General Comments        Pertinent Vitals/Pain Pain Assessment: Faces Faces Pain Scale: No hurt    Home Living                      Prior Function            PT Goals (current goals can now be found in the care plan section) Progress towards PT goals: Progressing toward goals    Frequency    Min 2X/week      PT Plan Current plan remains appropriate;Discharge plan needs to be updated    Co-evaluation              AM-PAC PT "6  Clicks" Mobility   Outcome Measure  Help needed turning from your back to your side while in a flat bed without using bedrails?: A Little Help needed moving from lying on your back to sitting on the side of a flat bed without using bedrails?: A Little Help needed moving to and from a bed to a chair (including a wheelchair)?: A Little Help needed standing up from a chair using your arms (e.g., wheelchair or bedside chair)?: A Little Help needed to walk in hospital room?: A Little Help needed climbing 3-5 steps with a railing? : A Lot 6 Click Score: 17    End of Session Equipment Utilized During Treatment: Gait belt Activity Tolerance: Treatment limited secondary to medical complications (Comment);Patient limited by fatigue Patient left: in bed;with call bell/phone within reach;with family/visitor present Nurse Communication: Mobility status PT Visit Diagnosis: Other abnormalities of gait and mobility (R26.89);Muscle weakness (generalized) (M62.81);Difficulty in walking, not elsewhere classified (R26.2)     Time: 5400-8676 PT Time Calculation (min) (ACUTE ONLY): 43 min  Charges:  $Therapeutic Exercise: 38-52 mins                     Lieutenant Diego PT, DPT 11:05 AM,01/04/21

## 2021-01-04 NOTE — ED Notes (Signed)
Called son Rickie to let him know pt was being moved to room 128.

## 2021-01-04 NOTE — Progress Notes (Signed)
PT brought to room 128, pt assisted to hospital bed, gown changed, pt placed on tele box, NT in room, pt placed on 3L [place]

## 2021-01-04 NOTE — ED Notes (Signed)
Son Rickie at bedside. Pt sitting in recliner eating breakfast.

## 2021-01-04 NOTE — Progress Notes (Signed)
Pt states family is aware that he is here

## 2021-01-04 NOTE — Progress Notes (Signed)
[facility]    SUBJECTIVE: Michael Simon is a 87 year old male with a past medical history significant for coronary artery disease s/p CABG, a chronic LBBB, type 2 diabetes, hyperlipidemia, and hypertension who presented to the ED on 01/02/21 for severe right lower abdominal pain. CT abdomen revealed diffuse diverticulosis with uncomplicated sigmoid diverticulitis and chronic pancreatitis. Chest CT was negative for a PE with mild pulmonary edema. ECG revealed sinus tachycardia and he given IV amiodarone which caused his BP to drop.  High sensitivity troponin borderline elevated, 33, 28, 26, 27 respectively.   01/04/21: Michael Simon is currently sitting up in bed, eating lunch, in no acute distress.  He denies any recurrent abdominal pain.  He denies chest pain, palpitations, heart racing, or shortness of breath.  He continues to express his desire to go home.    Vitals:   01/04/21 0630 01/04/21 0754 01/04/21 0800 01/04/21 0830  BP: 101/70 116/73 100/72 110/67  Pulse:  85 70 80  Resp: 18 (!) 23 (!) 36 20  Temp:  97.9 F (36.6 C)    TempSrc:  Oral    SpO2:  100% 100% 96%  Weight:      Height:         Intake/Output Summary (Last 24 hours) at 01/04/2021 0915 Last data filed at 01/04/2021 0746 Gross per 24 hour  Intake 295.17 ml  Output -  Net 295.17 ml      PHYSICAL EXAM  General: Well developed, well nourished, in no acute distress HEENT:  Normocephalic and atramatic Neck:  No JVD.  Lungs: Clear bilaterally to auscultation and percussion. Heart: Regularly irregular. Normal S1 and S2 without gallops or murmurs.  Abdomen: Bowel sounds are positive, abdomen soft and non-tender  Msk:  Back normal.  Normal strength and tone for age. Extremities: No clubbing, cyanosis or edema.   Neuro: Alert and oriented X 3. Psych:  Good affect, responds appropriately   LABS: Basic Metabolic Panel: Recent Labs    01/02/21 2236 01/03/21 0513  NA 141 144  K 4.0 4.2  CL 106 107  CO2 25 26   GLUCOSE 168* 120*  BUN 25* 25*  CREATININE 1.34* 1.22  CALCIUM 7.7* 8.0*  MG 1.7  --   PHOS 3.6  --    Liver Function Tests: Recent Labs    01/02/21 1538  AST 20  ALT 18  ALKPHOS 79  BILITOT 0.9  PROT 8.2*  ALBUMIN 4.1   Recent Labs    01/02/21 1538  LIPASE 28   CBC: Recent Labs    01/02/21 1538 01/02/21 2130 01/03/21 0513 01/03/21 0919 01/03/21 2011 01/04/21 0756  WBC 9.3   < > 11.2*  --   --  9.4  NEUTROABS 7.4  --   --   --   --  8.1*  HGB 11.1*   < > 10.8*   < > 9.7* 8.7*  HCT 35.0*   < > 33.8*  --   --  28.3*  MCV 96.4   < > 96.0  --   --  100.7*  PLT 189   < > 153  --   --  156   < > = values in this interval not displayed.   Cardiac Enzymes: No results for input(s): CKTOTAL, CKMB, CKMBINDEX, TROPONINI in the last 72 hours. BNP: Invalid input(s): POCBNP D-Dimer: No results for input(s): DDIMER in the last 72 hours. Hemoglobin A1C: Recent Labs    01/02/21 2236  HGBA1C 8.2*   Fasting  Lipid Panel: No results for input(s): CHOL, HDL, LDLCALC, TRIG, CHOLHDL, LDLDIRECT in the last 72 hours. Thyroid Function Tests: Recent Labs    01/02/21 1538  TSH 3.698   Anemia Panel: Recent Labs    01/02/21 2236  VITAMINB12 2,066*  FOLATE 14.3  FERRITIN 88  TIBC 263  IRON 25*  RETICCTPCT 1.9    CT ANGIO CHEST PE W OR WO CONTRAST  Result Date: 01/03/2021 CLINICAL DATA:  Elevated D-dimer with pain EXAM: CT ANGIOGRAPHY CHEST WITH CONTRAST TECHNIQUE: Multidetector CT imaging of the chest was performed using the standard protocol during bolus administration of intravenous contrast. Multiplanar CT image reconstructions and MIPs were obtained to evaluate the vascular anatomy. CONTRAST:  41mL OMNIPAQUE IOHEXOL 350 MG/ML SOLN COMPARISON:  Chest radiograph January 02, 2021 FINDINGS: Cardiovascular: There is no demonstrable pulmonary embolus. There is no thoracic aortic aneurysm. No dissection is seen; note that the contrast bolus in the aorta is not sufficient for  dissection assessment. There are foci of calcification in visualized great vessels. There are multiple foci of aortic atherosclerosis. There are foci of native coronary artery calcification. Patient is status post coronary artery bypass grafting. There is a degree of cardiomegaly. Mediastinum/Nodes: Thyroid is diminutive without focal lesion. There is a precarinal lymph node measuring 1.4 x 1.2 cm. There are several scattered subcentimeter mediastinal lymph nodes as well. No esophageal lesions are appreciable. Lungs/Pleura: There is underlying centrilobular and paraseptal emphysematous change with scattered areas of peripheral fibrosis. There is suspected scarring in the anterior left apex. There is a free-flowing right pleural effusion with atelectasis and mild consolidation in the right lower lobe. There is peripheral lobular septal thickening in the lower lobes, suspicious for a degree of interstitial edema. Upper Abdomen: There is reflux of contrast into the inferior vena cava and hepatic veins. There is a cyst near the junction of the right and left lobes of the liver measuring 3.2 x 2.0 cm. Gallbladder is absent. There is extensive upper abdominal aortic and visualized great vessel atherosclerosis. Musculoskeletal: Patient is status post median sternotomy. Sternal separation noted throughout the sternum despite wire fixation. No blastic or lytic bone lesions. No evident chest wall lesions. Review of the MIP images confirms the above findings. IMPRESSION: 1. No demonstrable pulmonary embolus. No thoracic aortic aneurysm. No dissection seen with caveat that there is insufficient contrast within the aorta to assess for potential dissection. There is aortic atherosclerosis as well as foci of great vessel and native coronary artery calcification. Patient is status post coronary artery bypass grafting. There is cardiomegaly. 2. Right pleural effusion with right base atelectasis and suspected small focus of pneumonia  right base posteriorly. 3. Underlying emphysematous change with scarring and fibrotic type change, primarily in the upper lobe regions. 4. Suspect lower lobe interstitial pulmonary edema. There may well be a degree of congestive heart failure. 5. Reflux of contrast into the inferior vena cava and hepatic veins, a finding that likely is indicative of increased right heart pressure. 6.  Sternal separation despite wire fixation. 7.  Gallbladder absent. Aortic Atherosclerosis (ICD10-I70.0) and Emphysema (ICD10-J43.9). Electronically Signed   By: Lowella Grip III M.D.   On: 01/03/2021 07:52   CT Abdomen Pelvis W Contrast  Result Date: 01/02/2021 CLINICAL DATA:  Right lower quadrant pain that started today. EXAM: CT ABDOMEN AND PELVIS WITH CONTRAST TECHNIQUE: Multidetector CT imaging of the abdomen and pelvis was performed using the standard protocol following bolus administration of intravenous contrast. CONTRAST:  155mL OMNIPAQUE IOHEXOL 300 MG/ML  SOLN COMPARISON:  None. FINDINGS: Lower chest: Interval development of dependent cystic changes at the right base. Coronary artery calcifications. Hepatobiliary: Similar-appearing multiloculated fluid density lesion within the liver measuring simple fluid density and likely representing a simple hepatic cyst. Redemonstration of several subcentimeter/pericentimeter hypodensities are too small to characterize. Gallbladder is unremarkable. No biliary ductal dilatation. Pancreas: Pancreatic parenchyma calcifications scattered. No focal pancreatic lesion. Normal pancreatic contour with no definite inflammatory changes surrounding it. No main pancreatic Normal in size without focal abnormality. duct dilatation. Spleen: Normal in size without focal abnormality. Adrenals/Urinary Tract: No adrenal nodule bilaterally. Bilateral kidneys enhance symmetrically. There is a 2.6 cm fluid density lesion within the right kidney that likely represents a simple renal cyst. No  hydronephrosis. No hydroureter. The urinary bladder is unremarkable. Stomach/Bowel: Stomach is within normal limits. No evidence of small bowel wall thickening or dilatation. Diffuse colonic diverticulosis. Sigmoid bowel wall thickening with associated trace pericolonic fat stranding. No intramural abscess formation. Appendix appears normal. Vascular/Lymphatic: No abdominal aorta aneurysm. Aneurysmal dilatation of bilateral common iliac arteries measuring up to 1.7 cm bilaterally. Severe atherosclerotic plaque of the aorta and its branches. No abdominal, pelvic, or inguinal lymphadenopathy. Reproductive: Prostate is unremarkable. Other: No intraperitoneal free fluid. No intraperitoneal free gas. No organized fluid collection. Musculoskeletal: Small fat containing umbilical hernia with an abdominal wall defect of 1.3 cm. Levoscoliosis of the lumbar spine centered at the L3-L4 level. No suspicious lytic or blastic osseous lesions. No acute displaced fracture. Multilevel degenerative changes of the spine. IMPRESSION: 1. Diffuse colonic diverticulosis with uncomplicated sigmoid diverticulitis. No abscess formation or bowel perforation. Recommend colonoscopy status post treatment and status post complete resolution of inflammatory changes to evaluate for an underlying lesion as a malignancy cannot be fully excluded. 2. Bilateral common iliac artery aneurysm measuring up to 1.7 cm. 3. Findings suggestive of chronic pancreatitis. 4.  Aortic Atherosclerosis (ICD10-I70.0). Electronically Signed   By: Iven Finn M.D.   On: 01/02/2021 17:33   US Venous Img Lower Bilateral (DVT)  Result Date: 01/03/2021 CLINICAL DATA:  Bilateral lower extremity pain EXAM: For bilateral LOWER EXTREMITY VENOUS DOPPLER ULTRASOUND TECHNIQUE: Gray-scale sonography with compression, as well as color and duplex ultrasound, were performed to evaluate the deep venous system(s) from the level of the common femoral vein through the popliteal and  proximal calf veins. COMPARISON:  None. FINDINGS: VENOUS Normal compressibility of the common femoral, superficial femoral, and popliteal veins, as well as the visualized calf veins. Visualized portions of profunda femoral vein and great saphenous vein unremarkable. No filling defects to suggest DVT on grayscale or color Doppler imaging. Doppler waveforms show normal direction of venous flow, normal respiratory plasticity and response to augmentation. Limited views of the contralateral common femoral vein are unremarkable. OTHER None. Limitations: none IMPRESSION: Negative. Electronically Signed   By: Prudencio Pair M.D.   On: 01/03/2021 01:07   DG Chest Port 1 View  Result Date: 01/02/2021 CLINICAL DATA:  Coronary artery disease, history of myocardial infarction. Presenting with lower abdominal pain. EXAM: PORTABLE CHEST 1 VIEW COMPARISON:  Chest x-ray 12/30/2017, CT chest 04/19/2007 FINDINGS: Persistent cardiomegaly. Surgical changes related to cardiac surgery. The heart size and mediastinal contours are otherwise unchanged. Aortic arch calcifications. Left lower lobe opacity. Increased interstitial markings. Suggestion of bilateral, left greater than right, at least trace to small volume pleural effusions. No pneumothorax. No acute osseous abnormality. Redemonstration of sternotomy wires that appear fractured. IMPRESSION: 1. Suggestion of bilateral, left greater than right, at least trace to small volume  pleural effusions. 2. Mild pulmonary edema in the setting of cardiomegaly with underlying infection/inflammation not excluded. 3. Followup PA and lateral chest X-ray is recommended in 3-4 weeks following therapy to ensure resolution and exclude underlying malignancy. Electronically Signed   By: Iven Finn M.D.   On: 01/02/2021 21:36    TELEMETRY:Atrial flutter, rate in the 80s   ASSESSMENT AND PLAN:  Active Problems:   Atrial flutter (HCC)   Acute diverticulitis    1.  Atrial flutter   -Rate  controlled, in the 80s   -Became hypotensive with IV amiodarone; rate now better controlled; will reduce amiodarone from 400mg  BID to 200mg  BID   -Continue metoprolol tartrate 12.5mg  BID   -Patient wishes to go home; he appears stable from a cardiac standpoint; will discuss risks/benefits of long term anticoagulation upon discharge in an outpatient setting   -Recommend to follow up with Dr. [name] or Doristine Mango PA-C within 1 week of discharge   2.  History of coronary artery disease s/p CABG   -Continue aspirin 81mg  daily, pravastatin 40mg  daily, and metoprolol 12.5mg  BID   -No clinical evidence of ischemia   3.  LBBB  -Chronic   4. Hyperlipidemia   -Continue pravastatin 40mg  daily   The history, physical exam findings, and plan of care were all discussed with Dr. [name], and all decision making was made in collaboration.   Avie Arenas  PA-C 01/04/2021 9:15 AM

## 2021-01-04 NOTE — Progress Notes (Signed)
BP 86/47 MAP 61, other VSS, pt asymptomatic. Dr. Priscella Mann notified, no changes at this time.

## 2021-01-05 ENCOUNTER — Inpatient Hospital Stay: Payer: Medicare HMO

## 2021-01-05 DIAGNOSIS — I10 Essential (primary) hypertension: Secondary | ICD-10-CM

## 2021-01-05 DIAGNOSIS — Z794 Long term (current) use of insulin: Secondary | ICD-10-CM

## 2021-01-05 DIAGNOSIS — E1159 Type 2 diabetes mellitus with other circulatory complications: Secondary | ICD-10-CM

## 2021-01-05 DIAGNOSIS — I2510 Atherosclerotic heart disease of native coronary artery without angina pectoris: Secondary | ICD-10-CM

## 2021-01-05 DIAGNOSIS — J9601 Acute respiratory failure with hypoxia: Secondary | ICD-10-CM

## 2021-01-05 DIAGNOSIS — D649 Anemia, unspecified: Secondary | ICD-10-CM | POA: Diagnosis present

## 2021-01-05 DIAGNOSIS — N179 Acute kidney failure, unspecified: Secondary | ICD-10-CM | POA: Diagnosis not present

## 2021-01-05 LAB — CBC WITH DIFFERENTIAL/PLATELET
Abs Immature Granulocytes: 0.04 10*3/uL (ref 0.00–0.07)
Basophils Absolute: 0 10*3/uL (ref 0.0–0.1)
Basophils Relative: 0 %
Eosinophils Absolute: 0 10*3/uL (ref 0.0–0.5)
Eosinophils Relative: 0 %
HCT: 32.9 % — ABNORMAL LOW (ref 39.0–52.0)
Hemoglobin: 10.4 g/dL — ABNORMAL LOW (ref 13.0–17.0)
Immature Granulocytes: 0 %
Lymphocytes Relative: 10 %
Lymphs Abs: 1.1 10*3/uL (ref 0.7–4.0)
MCH: 31 pg (ref 26.0–34.0)
MCHC: 31.6 g/dL (ref 30.0–36.0)
MCV: 97.9 fL (ref 80.0–100.0)
Monocytes Absolute: 1 10*3/uL (ref 0.1–1.0)
Monocytes Relative: 9 %
Neutro Abs: 8.8 10*3/uL — ABNORMAL HIGH (ref 1.7–7.7)
Neutrophils Relative %: 81 %
Platelets: 165 10*3/uL (ref 150–400)
RBC: 3.36 MIL/uL — ABNORMAL LOW (ref 4.22–5.81)
RDW: 14.5 % (ref 11.5–15.5)
WBC: 11 10*3/uL — ABNORMAL HIGH (ref 4.0–10.5)
nRBC: 0 % (ref 0.0–0.2)

## 2021-01-05 LAB — BASIC METABOLIC PANEL
Anion gap: 10 (ref 5–15)
BUN: 43 mg/dL — ABNORMAL HIGH (ref 8–23)
CO2: 25 mmol/L (ref 22–32)
Calcium: 8.2 mg/dL — ABNORMAL LOW (ref 8.9–10.3)
Chloride: 104 mmol/L (ref 98–111)
Creatinine, Ser: 2.15 mg/dL — ABNORMAL HIGH (ref 0.61–1.24)
GFR, Estimated: 29 mL/min — ABNORMAL LOW (ref >60)
Glucose, Bld: 70 mg/dL (ref 70–99)
Potassium: 4.9 mmol/L (ref 3.5–5.1)
Sodium: 139 mmol/L (ref 135–145)

## 2021-01-05 LAB — URINE CULTURE: Culture: NO GROWTH

## 2021-01-05 LAB — GLUCOSE, CAPILLARY
Glucose-Capillary: 81 mg/dL (ref 70–99)
Glucose-Capillary: 69 mg/dL — ABNORMAL LOW (ref 70–99)
Glucose-Capillary: 70 mg/dL (ref 70–99)
Glucose-Capillary: 195 mg/dL — ABNORMAL HIGH (ref 70–99)
Glucose-Capillary: 141 mg/dL — ABNORMAL HIGH (ref 70–99)
Glucose-Capillary: 72 mg/dL (ref 70–99)
Glucose-Capillary: 42 mg/dL — CL (ref 70–99)

## 2021-01-05 MED ORDER — IPRATROPIUM BROMIDE 0.02 % IN SOLN
0.5000 mg | Freq: Four times a day (QID) | RESPIRATORY_TRACT | Status: DC
  Administered 2021-01-05 – 2021-01-06 (×3): 0.5 mg via RESPIRATORY_TRACT
  Filled 2021-01-05 (×4): qty 2.5

## 2021-01-05 MED ORDER — METRONIDAZOLE 500 MG PO TABS
500.0000 mg | ORAL_TABLET | Freq: Three times a day (TID) | ORAL | Status: DC
  Administered 2021-01-05 – 2021-01-06 (×5): 500 mg via ORAL
  Filled 2021-01-05 (×4): qty 1

## 2021-01-05 MED ORDER — LORATADINE 10 MG PO TABS
10.0000 mg | ORAL_TABLET | Freq: Every day | ORAL | Status: DC
  Administered 2021-01-05 – 2021-01-06 (×2): 10 mg via ORAL
  Filled 2021-01-05: qty 1

## 2021-01-05 MED ORDER — BUDESONIDE 0.5 MG/2ML IN SUSP
0.5000 mg | Freq: Two times a day (BID) | RESPIRATORY_TRACT | Status: DC
  Administered 2021-01-06: 0.5 mg via RESPIRATORY_TRACT
  Filled 2021-01-05 (×2): qty 2

## 2021-01-05 MED ORDER — SODIUM CHLORIDE 0.9 % IV BOLUS
250.0000 mL | Freq: Once | INTRAVENOUS | Status: AC
  Administered 2021-01-05: 250 mL via INTRAVENOUS

## 2021-01-05 MED ORDER — INSULIN GLARGINE 100 UNIT/ML ~~LOC~~ SOLN
6.0000 [IU] | Freq: Every day | SUBCUTANEOUS | Status: DC
  Filled 2021-01-05: qty 0.06

## 2021-01-05 MED ORDER — FLUTICASONE PROPIONATE 50 MCG/ACT NA SUSP
2.0000 | Freq: Every day | NASAL | Status: DC
  Administered 2021-01-05 – 2021-01-06 (×2): 2 via NASAL
  Filled 2021-01-05: qty 16

## 2021-01-05 MED ORDER — LEVALBUTEROL HCL 0.63 MG/3ML IN NEBU
0.6300 mg | INHALATION_SOLUTION | Freq: Four times a day (QID) | RESPIRATORY_TRACT | Status: DC
  Administered 2021-01-05 – 2021-01-06 (×3): 0.63 mg via RESPIRATORY_TRACT
  Filled 2021-01-05 (×8): qty 3

## 2021-01-05 MED ORDER — SODIUM CHLORIDE 0.9 % IV SOLN: INTRAVENOUS | Status: AC

## 2021-01-05 NOTE — Progress Notes (Signed)
PHARMACIST - PHYSICIAN COMMUNICATION DR:   [place]ville Silos CONCERNING: Antibiotic IV to Oral Route Change Policy  RECOMMENDATION: This patient is receiving Metronidazole by the intravenous route.  Based on criteria approved by the Pharmacy and Therapeutics Committee, the antibiotic(s) is/are being converted to the equivalent oral dose form(s).   DESCRIPTION: These criteria include:  Patient being treated for a respiratory tract infection, urinary tract infection, cellulitis or clostridium difficile associated diarrhea if on metronidazole  The patient is not neutropenic and does not exhibit a GI malabsorption state  The patient is eating (either orally or via tube) and/or has been taking other orally administered medications for a least 24 hours  The patient is improving clinically and has a Tmax < 100.5  If you have questions about this conversion, please contact the Pharmacy Department

## 2021-01-05 NOTE — Progress Notes (Addendum)
Occupational Therapy Treatment Patient Details Name: Michael Simon MRN: [id number] DOB: 02-02-1933 Today's Date: 01/05/2021    History of present illness Pt is an 87 y.o. male with medical history significant of CAD s/p MI, HLD, LBBB, DMII , HTN who presents to ED with complaint of lower abdominal pain. Pt noted to be in atrial flutter.   OT comments  Michael Simon was seen for OT treatment on this date. Upon arrival to room pt asleep, semi-supine in bed. Pt wakes to VCs and agreeable to OT tx session. Pt son presents to bedside shortly after therapist enters room and remains t/o session. Pt denies pain, but endorses generalized fatigue from being up in the chair and walking with nsg staff this date. Per pt/son pt was able to walk within his room this date. Pt agreeable to attempting standing grooming tasks at sink with therapist, however he is unable to progress to standing from sitting at EOB after requiring MAX A to come to sitting at EOB with increased cueing and time to perform. Pt limited by decreased activity tolerance and generalized weakness this date. He requires MAX A to don bilat hospital socks at bed level. MAX A to come to sitting at EOB, and is unable to progress with functional transfers at this time. Vitals monitored t/o session and pt is noted to desat to 88% on 3L [place]. HR remains WFLs t/o session. Pt endorses significant fatigue and requests to return to supine in bed. Pt/caregiver educated on energy conservation strategies including pursed lip breathing as pt is noted to generally maintain open mouth posture during session and strategies to support management of day/night sleep cycles to minimize risk of acute delirium during hospital stay. Pt/caregiver return verbalize understanding however pt requires moderate cueing to implement PLB technique during functional mobility. Pt progressing toward OT goals and continues to benefit from skilled OT services to maximize return to PLOF and  minimize risk of future falls, injury, caregiver burden, and readmission. Will continue to follow POC. Discharge recommendation updated to STR as his functional needs have progressed since time of initial OT evaluation.  Frequency remains appropriate.    Follow Up Recommendations  SNF;Supervision/Assistance - 24 hour (HHOT if family/pt decline STR.)    Equipment Recommendations  3 in 1 bedside commode    Recommendations for Other Services      Precautions / Restrictions Precautions Precautions: Fall Precaution Comments: Monitor HR/O2 sats Restrictions Weight Bearing Restrictions: No       Mobility Bed Mobility Overal bed mobility: Needs Assistance Bed Mobility: Supine to Sit   Sidelying to sit: Max assist;HOB elevated;Mod assist Supine to sit: Max assist     General bed mobility comments: MAX A to come to sit at EOB. Pt with significant L lateral lean, which he states is due to fatigue.  Transfers Overall transfer level: Needs assistance Equipment used: Rolling walker (2 wheeled) Transfers: Sit to/from Stand Sit to Stand: Min assist         General transfer comment: Pt attempts STS x2 but is unable to come to full stand and requests to return to supine, despite education on importance of mobility.    Balance Overall balance assessment: Needs assistance Sitting-balance support: Bilateral upper extremity supported Sitting balance-Leahy Scale: Fair Sitting balance - Comments: Generally leans onto R shoulder; able to sit unsupported briefly, but returns to leaning position during session. Postural control: Right lateral lean   Standing balance-Leahy Scale: Poor Standing balance comment: reliant on UE support this AM  ADL either performed or assessed with clinical judgement   ADL Overall ADL's : Needs assistance/impaired                                       General ADL Comments: Pt limited by decreased activity  tolerance and generalized weakness this date. He requires MAX A to don bilat hospital socks at bed level. MAX A to come to sitting at EOB, and is unable to progress with functional transfers at this time. Vitals monitored t/o session and pt is noted to desat to 88% on 3L [place]. HR remains WFLs t/o session. Pt endorses significant fatigue and requests to return to supine in bed. Pt/caregiver educated on energy conservation strategies including pursed lip breathing as pt is noted to generally maintain open mouth posture during session and strategies to support management of day/night sleep cycles to minimize risk of acute delirium during hospital stay. Pt/caregiver return verbalize understanding however pt requires moderate cueing to implement PLB technique during functional mobility.     Vision       Perception     Praxis      Cognition Arousal/Alertness: Awake/alert Behavior During Therapy: WFL for tasks assessed/performed Overall Cognitive Status: Within Functional Limits for tasks assessed                                          Exercises Other Exercises Other Exercises: OT facilitates bed mobility and education as described above. See ADL section for additional detail. Other Exercises: assisted with set up for breakfast at pt request. difficulty noted with holding utensils but able to eat a piece of bacon with time and patience   Shoulder Instructions       General Comments      Pertinent Vitals/ Pain       Pain Assessment: No/denies pain Faces Pain Scale: No hurt Pain Location: back pain  Home Living                                          Prior Functioning/Environment              Frequency  Min 2X/week        Progress Toward Goals  OT Goals(current goals can now be found in the care plan section)  Progress towards OT goals: Progressing toward goals  Acute Rehab OT Goals Patient Stated Goal: To go home OT Goal Formulation:  With patient/family Time For Goal Achievement: 01/18/21 Potential to Achieve Goals: Good  Plan Discharge plan needs to be updated;Frequency remains appropriate    Co-evaluation                 AM-PAC OT "6 Clicks" Daily Activity     Outcome Measure   Help from another person eating meals?: None Help from another person taking care of personal grooming?: A Little Help from another person toileting, which includes using toliet, bedpan, or urinal?: A Lot Help from another person bathing (including washing, rinsing, drying)?: A Lot Help from another person to put on and taking off regular upper body clothing?: A Little Help from another person to put on and taking off regular lower body clothing?: A Lot 6 Click Score:  16    End of Session Equipment Utilized During Treatment: Gait belt;Rolling walker  OT Visit Diagnosis: Other abnormalities of gait and mobility (R26.89)   Activity Tolerance Patient limited by fatigue   Patient Left in bed;with call bell/phone within reach;with bed alarm set;with family/visitor present   Nurse Communication          Time: 3074-6002 OT Time Calculation (min): 38 min  Charges: OT General Charges $OT Visit: 1 Visit OT Treatments $Self Care/Home Management : 38-52 mins  Shara Blazing, M.S., OTR/L Ascom: [phone] 01/05/21, 5:07 PM

## 2021-01-05 NOTE — Progress Notes (Signed)
Physical Therapy Treatment Patient Details Name: Michael Simon MRN: [id number] DOB: 1933/11/10 Today's Date: 01/05/2021    History of Present Illness Pt is an 87 y.o. male with medical history significant of CAD s/p MI, HLD, LBBB, DMII , HTN who presents to ED with complaint of lower abdominal pain. Pt noted to be in atrial flutter.    PT Comments    Patient alert, requesting to get up out of bed to chair to eat breakfast, feet out of bed. Returned to supine for pt to rest and for PT to prepare the room and chair. The patient continued to demonstrate great motivation during session, but still has difficulties with functional mobility and needed more assist this session. Exhibited posterior lean in standing needing minA to maintain balance. Orthostatic vitals also assessed, WFLs (see vitals flowsheets for details). Pt transferred to recliner in room with RW and minA, fatigued and requesting to eat breakfast, declining further mobility at this time. PT and family had a discussion about pt status and current level of assistance needed, [name] adamant that 24/7 supervision and assistance can be provided, including physical assist. Educated on pt need for WC to get into home, as well as stand pivot transfers to commode, in/out of bed. Family confident in their ability to provide him with this assistance. Recommendation remains HHPT with 24/7 supervision and assistance. PT to attempt pt in AM tomorrow to continue to assess mobility and discharge needs.      Follow Up Recommendations  Home health PT;Supervision/Assistance - 24 hour     Equipment Recommendations  None recommended by PT    Recommendations for Other Services       Precautions / Restrictions Precautions Precautions: Fall Restrictions Weight Bearing Restrictions: No    Mobility  Bed Mobility Overal bed mobility: Needs Assistance Bed Mobility: Supine to Sit     Supine to sit: Min assist     General bed  mobility comments: for LE assist and trunk elevation due to pt fatigue  Transfers Overall transfer level: Needs assistance Equipment used: Rolling walker (2 wheeled) Transfers: Sit to/from Stand Sit to Stand: Min assist         General transfer comment: pt with posterior lean throughout. performed twice, second attempt mildly better balance noted  Ambulation/Gait             General Gait Details: deferred due to pt request to eat breakfast, he also reported he was tired   Marine [employer] [place] (Stroke Patients Only)       Balance Overall balance assessment: Needs assistance Sitting-balance support: Bilateral upper extremity supported Sitting balance-Leahy Scale: Good       Standing balance-Leahy Scale: Poor Standing balance comment: reliant on UE support this AM                            Cognition Arousal/Alertness: Awake/alert Behavior During Therapy: WFL for tasks assessed/performed Overall Cognitive Status: Within Functional Limits for tasks assessed                                        Exercises Other Exercises Other Exercises: Patient orthostatics assessed, see vitals flowsheets for details. Other Exercises: assisted with set up for breakfast at pt request. difficulty noted with  holding utensils but able to eat a piece of bacon with time and patience    General Comments        Pertinent Vitals/Pain Pain Assessment: No/denies pain    Home Living                      Prior Function            PT Goals (current goals can now be found in the care plan section) Progress towards PT goals: Progressing toward goals (slowly)    Frequency    Min 2X/week      PT Plan Current plan remains appropriate;Discharge plan needs to be updated    Co-evaluation              AM-PAC PT "6 Clicks" Mobility   Outcome Measure  Help needed turning from your back to  your side while in a flat bed without using bedrails?: A Little Help needed moving from lying on your back to sitting on the side of a flat bed without using bedrails?: A Little Help needed moving to and from a bed to a chair (including a wheelchair)?: A Little Help needed standing up from a chair using your arms (e.g., wheelchair or bedside chair)?: A Little Help needed to walk in hospital room?: A Little Help needed climbing 3-5 steps with a railing? : A Lot 6 Click Score: 17    End of Session Equipment Utilized During Treatment: Gait belt Activity Tolerance: Treatment limited secondary to medical complications (Comment);Patient limited by fatigue Patient left: in bed;with call bell/phone within reach;with family/visitor present Nurse Communication: Mobility status PT Visit Diagnosis: Other abnormalities of gait and mobility (R26.89);Muscle weakness (generalized) (M62.81);Difficulty in walking, not elsewhere classified (R26.2)     Time: 9678-9381 PT Time Calculation (min) (ACUTE ONLY): 34 min  Charges:  $Therapeutic Exercise: 23-37 mins                     Lieutenant Diego PT, DPT 3:03 PM,01/05/21

## 2021-01-05 NOTE — Care Management Important Message (Signed)
Important Message  Patient Details  Name: Michael Simon MRN: [id number] Date of Birth: 1933-07-06   Medicare Important Message Given:  Yes     Juliann Pulse A Ethelyne Erich 01/05/2021, 11:07 AM

## 2021-01-05 NOTE — Plan of Care (Signed)

## 2021-01-05 NOTE — Progress Notes (Signed)
PROGRESS NOTE    Michael Simon  QHU:[id number] DOB: 1933-02-12 DOA: 01/02/2021 PCP: [name], MD    Chief Complaint  Patient presents with  . Abdominal Pain    Brief Narrative:  87 y.o.malewith medical history significant ofCAD s/p MI, HLD, LBBB, DMII , HTN who presents to ED with complaint of lower abdominal Pain on the right, described as sharp in nature that started acutely after lunch day of presentation.Patient notes no associated emesis, no diarrhea, no fever/ chills/ or sweats. He notes he was in his normal state of health prior to the onset of abdominal pain. He notes no cough , chest pain , palpitations , dysuria or presyncope. He does Have interim history of bronchitis 2-3 weeks ago but notes his symptoms have completely resolved.  He notes he is fully vaccinated for covid and has gotten his booster.  IV amiodarone attempted yesterday with intolerance due to drop in systolic blood pressure.  Seen by cardiology.  Transition to p.o. amiodarone and attempt to control ventricular rate.  No indication for gastroenterology evaluation at this time.  Case discussed with Dr. Marius Ditch.  Patient had a history of colonoscopy in 2020 without recommendations for repeat.  Received 1 unit of blood for hemoglobin 8.6.    No GI bleed noted however hemoglobin continues to drift down.  Likely secondary to acute diverticulitis.  Patient also now requiring 3 L of oxygen.  Chest x-ray reveals mild pulmonary edema.  I had a conversation with the patient and his son at bedside.  Explained reason for continued hospitalization.  From a cardiac standpoint patient is stable.  Amiodarone changed to oral by cardiology.  Case discussed with Dr. [name].  Defer anticoagulation at this time considering concern for ongoing GI bleed.  Can resume discussion as outpatient   Assessment & Plan:   Principal Problem:   Atrial flutter ([place]) Active Problems:   Diabetes mellitus ([place])   Essential  hypertension   CAD (coronary artery disease)   Acute diverticulitis   Acute respiratory failure with hypoxia ([facility])   Anemia   AKI (acute kidney injury) ([place])  1 acute hypoxic respiratory failure Patient with new O2 requirements currently on 3 L nasal cannula with sats of 95%.  New O2 requirements noted to have developed after admission initially concerning for acute pulmonary edema and probable atrial flutter.  Patient received a dose of Lasix 40 mg IV x1.  Bump in patient's creatinine.  Patient this morning seems visibly short of breath with some minimal expiratory wheezing and some bibasilar crackles.  Chest x-ray obtained with underlying emphysematous change with areas of fibrosis.  Small right pleural effusion with atelectasis and suspected superimposed pneumonia at the right base, no new opacity.  Stable cardiac prominence and postop changes.  Aortic atherosclerosis. We will give patient a 250 cc bolus x1.  Gentle hydration.  Placed on Pulmicort nebs twice daily, Xopenex and Atrovent nebs, Flonase, PPI, Claritin.  Supportive care.  We will need to check ambulatory sats prior to discharge.  Supportive care.  Follow-up.  2.  Acute sigmoid diverticulitis Noted on CT scan.  No signs of abscess or perforation.  Status post 1 unit packed red blood cells hemoglobin was at 8.6 currently at 10.4.  Patient denies any abdominal pain.  Tolerating current diet.  Continue IV Rocephin.  Patient has been transitioned to oral Flagyl.  Supportive care.  Follow-up.  3.  Atrial flutter/A. fib with rapid ventricular response Patient seen in consultation by cardiology.  Initially attempted  amiodarone bolus but patient had a drop in systolic blood pressure.  Currently on oral amiodarone 200 mg twice daily and metoprolol.  Anticoagulation deferred at this time due to concern for possible GI bleed in the setting of diverticular disease.  Anticoagulation conversation may be resumed in the outpatient setting.  Cardiology  following.  4.  Acute anemia Patient noted to have a drop in hemoglobin of 8.6.  No overt bleeding noted.  Status posttransfusion of 1 unit packed red blood cells.  Hemoglobin currently stable at 10.4.  Follow H&H.  Outpatient follow-up.  5.  Vitamin B12 deficiency Continue oral supplementation.  6.  Hypertension ACE inhibitor on hold due to hypotension.  Currently on metoprolol.  Gentle hydration.  Follow.  7.  Acute kidney injury Patient noted to have a creatinine of 2.15 today.  ACE inhibitor on hold.  Patient noted to have had a hypotensive episode after being given bolus of amiodarone.  Patient also received a dose of Lasix 40 mg IV x1 due to concerns for pulmonary edema.  Given normal saline 250 cc bolus x1.  Gentle hydration with normal saline 50 cc an hour for the next 24 hours.  Follow-up.  8.  Type 2 diabetes mellitus with hyperglycemia Hemoglobin A1c 8.2 (01/02/2021).  CBG currently at 195 this morning from 69 earlier on.  Decrease Lantus to 60 units daily.  Sliding scale insulin.  Outpatient follow-up.  9.  History of coronary artery disease status post CABG/chronic left bundle branch block Stable.  Continue aspirin, Pravachol, metoprolol.  Per cardiology.   DVT prophylaxis: SCDs Code Status: Full Family Communication: Updated patient and son at bedside. Disposition:   Status is: Inpatient    Dispo: The patient is from: Home              Anticipated d/c is to: Home with home health therapies and 24/7 supervision.              Anticipated d/c date is: 2 to 3 days              Patient currently with dyspnea with new oxygen requirements not on oxygen prior to admission, on IV antibiotics, atrial flutter, not stable for discharge.       Consultants:   Cardiology: Dr. [name] 01/03/2021  Procedures:   CT abdomen pelvis 01/02/2021  CT angiogram chest 01/03/2021  Chest x-ray 01/02/2021, 01/04/2021, 01/05/2021  Lower extremity Dopplers  01/03/2021    Antimicrobials:  IV Rocephin 01/02/2021>>>>  IV Flagyl 01/02/2021>>>> 01/05/2021  Oral Flagyl 01/05/2021>>>.   Subjective: Seems visibly SOB.  Denies any chest pain.  Denies any significant shortness of breath although visibly short of breath.  No abdominal pain.  Son at bedside.  Objective: Vitals:   01/05/21 0500 01/05/21 0734 01/05/21 1139 01/05/21 1509  BP:  124/62 (!) 97/48 (!) 127/52  Pulse:  (!) 110 (!) 54 76  Resp:  18 20 17   Temp:  98.2 F (36.8 C) 99.1 F (37.3 C) 98 F (36.7 C)  TempSrc:   Oral   SpO2:  95% 99% 95%  Weight: 86 kg     Height:        Intake/Output Summary (Last 24 hours) at 01/05/2021 1547 Last data filed at 01/05/2021 1761 Gross per 24 hour  Intake 398.46 ml  Output 500 ml  Net -101.54 ml   Filed Weights   01/02/21 1513 01/05/21 0500  Weight: 79.4 kg 86 kg    Examination:  General exam: Visibly short of breath.  Sitting in chair. Respiratory system: Bilateral crackles.  Minimal expiratory wheezing.  Visibly short of breath. Cardiovascular system: Irregularly irregular.no JVD.  No lower extremity edema. Gastrointestinal system: Abdomen is nondistended, soft and nontender. No organomegaly or masses felt. Normal bowel sounds heard. Central nervous system: Alert and oriented. No focal neurological deficits. Extremities: Symmetric 5 x 5 power. Skin: No rashes, lesions or ulcers Psychiatry: Judgement and insight appear normal. Mood & affect appropriate.     Data Reviewed: I have personally reviewed following labs and imaging studies  CBC: Recent Labs  Lab 01/02/21 1538 01/02/21 2130 01/02/21 2236 01/03/21 0513 01/03/21 0919 01/03/21 1431 01/03/21 2011 01/04/21 0756 01/04/21 1729 01/05/21 0422  WBC 9.3 13.5*  --  11.2*  --   --   --  9.4  --  11.0*  NEUTROABS 7.4  --   --   --   --   --   --  8.1*  --  8.8*  HGB 11.1* 8.6* 9.0* 10.8*   < > 11.1* 9.7* 8.7* 10.5* 10.4*  HCT 35.0* 28.4* 29.0* 33.8*  --   --   --  28.3*   --  32.9*  MCV 96.4 97.9  --  96.0  --   --   --  100.7*  --  97.9  PLT 189 148*  --  153  --   --   --  156  --  165   < > = values in this interval not displayed.    Basic Metabolic Panel: Recent Labs  Lab 01/02/21 1538 01/02/21 2236 01/03/21 0513 01/04/21 0756 01/05/21 0422  NA 141 141 144 143 139  K 4.1 4.0 4.2 5.1 4.9  CL 102 106 107 108 104  CO2 28 25 26 22 25   GLUCOSE 310* 168* 120* 90 70  BUN 28* 25* 25* 29* 43*  CREATININE 1.39* 1.34* 1.22 1.56* 2.15*  CALCIUM 9.0 7.7* 8.0* 8.3* 8.2*  MG  --  1.7  --  2.4  --   PHOS  --  3.6  --   --   --     GFR: Estimated Creatinine Clearance: 27.3 mL/min (A) (by C-G formula based on SCr of 2.15 mg/dL (H)).  Liver Function Tests: Recent Labs  Lab 01/02/21 1538  AST 20  ALT 18  ALKPHOS 79  BILITOT 0.9  PROT 8.2*  ALBUMIN 4.1    CBG: Recent Labs  Lab 01/05/21 0137 01/05/21 0404 01/05/21 0431 01/05/21 0736 01/05/21 1142  GLUCAP 81 69* 70 195* 141*     Recent Results (from the past 240 hour(s))  SARS CORONAVIRUS 2 (TAT 6-24 HRS) Nasopharyngeal Nasopharyngeal Swab     Status: None   Collection Time: 01/02/21  8:51 PM   Specimen: Nasopharyngeal Swab  Result Value Ref Range Status   SARS Coronavirus 2 NEGATIVE NEGATIVE Final    Comment: (NOTE) SARS-CoV-2 target nucleic acids are NOT DETECTED.  The SARS-CoV-2 RNA is generally detectable in upper and lower respiratory specimens during the acute phase of infection. Negative results do not preclude SARS-CoV-2 infection, do not rule out co-infections with other pathogens, and should not be used as the sole basis for treatment or other patient management decisions. Negative results must be combined with clinical observations, patient history, and epidemiological information. The expected result is Negative.  Fact Sheet for Patients: SugarRoll.be  Fact Sheet for Healthcare Providers: https://www.woods-mathews.com/  This  test is not yet approved or cleared by the [place] FDA and  has been authorized for  detection and/or diagnosis of SARS-CoV-2 by FDA under an Emergency Use Authorization (EUA). This EUA will remain  in effect (meaning this test can be used) for the duration of the COVID-19 declaration under Se ction 564(b)(1) of the Act, 21 U.S.C. section 360bbb-3(b)(1), unless the authorization is terminated or revoked sooner.  Performed at [facility], [place] [address], [place], [place] [address]   Urine culture     Status: None   Collection Time: 01/03/21  9:19 AM   Specimen: Urine, Random  Result Value Ref Range Status   Specimen Description   Final    URINE, RANDOM Performed at [facility], [address] [place], [place] [address]    Special Requests   Final    NONE Performed at [facility], [address] [place], [place] [address]    Culture   Final    NO GROWTH Performed at [facility], [place] [address] [place], [place] [address]    Report Status 01/05/2021 FINAL  Final         Radiology Studies: DG Chest Port 1 View  Result Date: 01/05/2021 CLINICAL DATA:  Shortness of breath EXAM: PORTABLE CHEST 1 VIEW COMPARISON:  January 04, 2021 chest radiograph and chest CT January 03, 2019 to FINDINGS: There is underlying emphysematous changes, better delineated on CT with scattered areas of fibrosis. There is a small right pleural effusion with ill-defined airspace opacity in the right base, concerning for combination of atelectasis and pneumonia. No new opacity evident. Heart is mildly enlarged with pulmonary vascularity normal. Patient is status post coronary artery bypass grafting. There is aortic atherosclerosis. No adenopathy. No bone lesions. IMPRESSION: Underlying emphysematous change with areas of fibrosis. Small right pleural effusion with atelectasis and suspected superimposed pneumonia right base. No new opacity. Stable cardiac prominence with  postoperative changes. There is aortic atherosclerosis. Aortic Atherosclerosis (ICD10-I70.0) and Emphysema (ICD10-J43.9). Electronically Signed   By: Lowella Grip III M.D.   On: 01/05/2021 10:13   DG Chest Port 1 View  Result Date: 01/04/2021 CLINICAL DATA:  Hypoxia EXAM: PORTABLE CHEST 1 VIEW COMPARISON:  01/02/2021 FINDINGS: Similar probable mild pulmonary edema. Probable trace pleural effusions. No pneumothorax. Stable cardiomegaly. IMPRESSION: Probable mild pulmonary edema and trace pleural effusions similar to prior. Electronically Signed   By: Macy Mis M.D.   On: 01/04/2021 09:13        Scheduled Meds: . amiodarone  200 mg Oral BID  . budesonide (PULMICORT) nebulizer solution  0.5 mg Nebulization BID  . digoxin  0.25 mg Intravenous Once  . fluticasone  2 spray Each Nare Daily  . insulin aspart  0-15 Units Subcutaneous TID WC  . insulin glargine  6 Units Subcutaneous QHS  . ipratropium  0.5 mg Nebulization Q6H  . levalbuterol  0.63 mg Nebulization Q6H  . loratadine  10 mg Oral Daily  . metoprolol tartrate  12.5 mg Oral BID  . metroNIDAZOLE  500 mg Oral Q8H  . pantoprazole (PROTONIX) IV  40 mg Intravenous Q12H  . pravastatin  40 mg Oral q1800  . vitamin B-12  1,000 mcg Oral Daily   Continuous Infusions: . sodium chloride 50 mL/hr at 01/05/21 1215  . cefTRIAXone (ROCEPHIN)  IV Stopped (01/04/21 2204)     LOS: 3 days    Time spent: 79 mins    Irine Seal, MD [facility]   To contact the attending provider between 7A-7P or the covering provider during after hours 7P-7A, please log into the  web site [email or web address] and access using universal [place] password for that web site. If you do not have the password, please call the hospital operator.  01/05/2021, 3:47 PM

## 2021-01-05 NOTE — Progress Notes (Signed)
Patient Name: Michael Simon Date of Encounter: 01/05/2021  Hospital Problem List     Active Problems:   Atrial flutter [facility])   Acute diverticulitis    Patient Profile     87 year old male with a past medical history significant for coronary artery disease s/p CABG, a chronic LBBB, type 2 diabetes, hyperlipidemia, and hypertension who presented to the ED on 01/02/21 for severe right lower abdominal pain. CT abdomen revealed diffuse diverticulosis with uncomplicated sigmoid diverticulitis and chronic pancreatitis. Chest CT was negative for a PE with mild pulmonary edema. ECG revealed sinus tachycardia and he given IV amiodarone which caused his BP to drop. High sensitivity troponin borderline elevated, 33, 28, 26, 27 respectively. He is currently sitting up in bed in no acute distress. He denies any recurrent abdominal pain. He denies chest pain, palpitations, heart racing, or shortness of breath. He continues to express his desire to go home.    Subjective  No complaints from cardiac standponit  Inpatient Medications    . amiodarone  200 mg Oral BID  . budesonide (PULMICORT) nebulizer solution  0.5 mg Nebulization BID  . digoxin  0.25 mg Intravenous Once  . fluticasone  2 spray Each Nare Daily  . insulin aspart  0-15 Units Subcutaneous TID WC  . insulin glargine  8 Units Subcutaneous QHS  . ipratropium  0.5 mg Nebulization Q6H  . levalbuterol  0.63 mg Nebulization Q6H  . loratadine  10 mg Oral Daily  . metoprolol tartrate  12.5 mg Oral BID  . metroNIDAZOLE  500 mg Oral Q8H  . pantoprazole (PROTONIX) IV  40 mg Intravenous Q12H  . pravastatin  40 mg Oral q1800  . vitamin B-12  1,000 mcg Oral Daily    Vital Signs    Vitals:   01/05/21 0400 01/05/21 0500 01/05/21 0734 01/05/21 1139  BP: 103/72  124/62 (!) 97/48  Pulse: 98  (!) 110 (!) 54  Resp: 18  18 20   Temp: 97.8 F (36.6 C)  98.2 F (36.8 C) 99.1 F (37.3 C)  TempSrc:    Oral  SpO2: 95%  95% 99%  Weight:  86  kg    Height:        Intake/Output Summary (Last 24 hours) at 01/05/2021 1306 Last data filed at 01/05/2021 6789 Gross per 24 hour  Intake 398.46 ml  Output 500 ml  Net -101.54 ml   Filed Weights   01/02/21 1513 01/05/21 0500  Weight: 79.4 kg 86 kg    Physical Exam    GEN: Well nourished, well developed, in no acute distress.  HEENT: normal.  Neck: Supple, no JVD, carotid bruits, or masses. Cardiac: irr, irr Respiratory:  Respirations regular and unlabored, clear to auscultation bilaterally. GI: Soft, nontender, nondistended, BS + x 4. MS: no deformity or atrophy. Skin: warm and dry, no rash. Neuro:  Strength and sensation are intact. Psych: Normal affect.  Labs    CBC Recent Labs    01/04/21 0756 01/04/21 1729 01/05/21 0422  WBC 9.4  --  11.0*  NEUTROABS 8.1*  --  8.8*  HGB 8.7* 10.5* 10.4*  HCT 28.3*  --  32.9*  MCV 100.7*  --  97.9  PLT 156  --  381   Basic Metabolic Panel Recent Labs    01/02/21 2236 01/03/21 0513 01/04/21 0756 01/05/21 0422  NA 141   < > 143 139  K 4.0   < > 5.1 4.9  CL 106   < >  108 104  CO2 25   < > 22 25  GLUCOSE 168*   < > 90 70  BUN 25*   < > 29* 43*  CREATININE 1.34*   < > 1.56* 2.15*  CALCIUM 7.7*   < > 8.3* 8.2*  MG 1.7  --  2.4  --   PHOS 3.6  --   --   --    < > = values in this interval not displayed.   Liver Function Tests Recent Labs    01/02/21 1538  AST 20  ALT 18  ALKPHOS 79  BILITOT 0.9  PROT 8.2*  ALBUMIN 4.1   Recent Labs    01/02/21 1538  LIPASE 28   Cardiac Enzymes No results for input(s): CKTOTAL, CKMB, CKMBINDEX, TROPONINI in the last 72 hours. BNP Recent Labs    01/02/21 2236  BNP 467.7*   D-Dimer No results for input(s): DDIMER in the last 72 hours. Hemoglobin A1C Recent Labs    01/02/21 2236  HGBA1C 8.2*   Fasting Lipid Panel No results for input(s): CHOL, HDL, LDLCALC, TRIG, CHOLHDL, LDLDIRECT in the last 72 hours. Thyroid Function Tests Recent Labs    01/02/21 1538  TSH  3.698    Telemetry    afib  ECG    afib  Radiology    CT ANGIO CHEST PE W OR WO CONTRAST  Result Date: 01/03/2021 CLINICAL DATA:  Elevated D-dimer with pain EXAM: CT ANGIOGRAPHY CHEST WITH CONTRAST TECHNIQUE: Multidetector CT imaging of the chest was performed using the standard protocol during bolus administration of intravenous contrast. Multiplanar CT image reconstructions and MIPs were obtained to evaluate the vascular anatomy. CONTRAST:  8mL OMNIPAQUE IOHEXOL 350 MG/ML SOLN COMPARISON:  Chest radiograph January 02, 2021 FINDINGS: Cardiovascular: There is no demonstrable pulmonary embolus. There is no thoracic aortic aneurysm. No dissection is seen; note that the contrast bolus in the aorta is not sufficient for dissection assessment. There are foci of calcification in visualized great vessels. There are multiple foci of aortic atherosclerosis. There are foci of native coronary artery calcification. Patient is status post coronary artery bypass grafting. There is a degree of cardiomegaly. Mediastinum/Nodes: Thyroid is diminutive without focal lesion. There is a precarinal lymph node measuring 1.4 x 1.2 cm. There are several scattered subcentimeter mediastinal lymph nodes as well. No esophageal lesions are appreciable. Lungs/Pleura: There is underlying centrilobular and paraseptal emphysematous change with scattered areas of peripheral fibrosis. There is suspected scarring in the anterior left apex. There is a free-flowing right pleural effusion with atelectasis and mild consolidation in the right lower lobe. There is peripheral lobular septal thickening in the lower lobes, suspicious for a degree of interstitial edema. Upper Abdomen: There is reflux of contrast into the inferior vena cava and hepatic veins. There is a cyst near the junction of the right and left lobes of the liver measuring 3.2 x 2.0 cm. Gallbladder is absent. There is extensive upper abdominal aortic and visualized great vessel  atherosclerosis. Musculoskeletal: Patient is status post median sternotomy. Sternal separation noted throughout the sternum despite wire fixation. No blastic or lytic bone lesions. No evident chest wall lesions. Review of the MIP images confirms the above findings. IMPRESSION: 1. No demonstrable pulmonary embolus. No thoracic aortic aneurysm. No dissection seen with caveat that there is insufficient contrast within the aorta to assess for potential dissection. There is aortic atherosclerosis as well as foci of great vessel and native coronary artery calcification. Patient is status post coronary artery bypass grafting.  There is cardiomegaly. 2. Right pleural effusion with right base atelectasis and suspected small focus of pneumonia right base posteriorly. 3. Underlying emphysematous change with scarring and fibrotic type change, primarily in the upper lobe regions. 4. Suspect lower lobe interstitial pulmonary edema. There may well be a degree of congestive heart failure. 5. Reflux of contrast into the inferior vena cava and hepatic veins, a finding that likely is indicative of increased right heart pressure. 6.  Sternal separation despite wire fixation. 7.  Gallbladder absent. Aortic Atherosclerosis (ICD10-I70.0) and Emphysema (ICD10-J43.9). Electronically Signed   By: Lowella Grip III M.D.   On: 01/03/2021 07:52   CT Abdomen Pelvis W Contrast  Result Date: 01/02/2021 CLINICAL DATA:  Right lower quadrant pain that started today. EXAM: CT ABDOMEN AND PELVIS WITH CONTRAST TECHNIQUE: Multidetector CT imaging of the abdomen and pelvis was performed using the standard protocol following bolus administration of intravenous contrast. CONTRAST:  124mL OMNIPAQUE IOHEXOL 300 MG/ML  SOLN COMPARISON:  None. FINDINGS: Lower chest: Interval development of dependent cystic changes at the right base. Coronary artery calcifications. Hepatobiliary: Similar-appearing multiloculated fluid density lesion within the liver  measuring simple fluid density and likely representing a simple hepatic cyst. Redemonstration of several subcentimeter/pericentimeter hypodensities are too small to characterize. Gallbladder is unremarkable. No biliary ductal dilatation. Pancreas: Pancreatic parenchyma calcifications scattered. No focal pancreatic lesion. Normal pancreatic contour with no definite inflammatory changes surrounding it. No main pancreatic Normal in size without focal abnormality. duct dilatation. Spleen: Normal in size without focal abnormality. Adrenals/Urinary Tract: No adrenal nodule bilaterally. Bilateral kidneys enhance symmetrically. There is a 2.6 cm fluid density lesion within the right kidney that likely represents a simple renal cyst. No hydronephrosis. No hydroureter. The urinary bladder is unremarkable. Stomach/Bowel: Stomach is within normal limits. No evidence of small bowel wall thickening or dilatation. Diffuse colonic diverticulosis. Sigmoid bowel wall thickening with associated trace pericolonic fat stranding. No intramural abscess formation. Appendix appears normal. Vascular/Lymphatic: No abdominal aorta aneurysm. Aneurysmal dilatation of bilateral common iliac arteries measuring up to 1.7 cm bilaterally. Severe atherosclerotic plaque of the aorta and its branches. No abdominal, pelvic, or inguinal lymphadenopathy. Reproductive: Prostate is unremarkable. Other: No intraperitoneal free fluid. No intraperitoneal free gas. No organized fluid collection. Musculoskeletal: Small fat containing umbilical hernia with an abdominal wall defect of 1.3 cm. Levoscoliosis of the lumbar spine centered at the L3-L4 level. No suspicious lytic or blastic osseous lesions. No acute displaced fracture. Multilevel degenerative changes of the spine. IMPRESSION: 1. Diffuse colonic diverticulosis with uncomplicated sigmoid diverticulitis. No abscess formation or bowel perforation. Recommend colonoscopy status post treatment and status post  complete resolution of inflammatory changes to evaluate for an underlying lesion as a malignancy cannot be fully excluded. 2. Bilateral common iliac artery aneurysm measuring up to 1.7 cm. 3. Findings suggestive of chronic pancreatitis. 4.  Aortic Atherosclerosis (ICD10-I70.0). Electronically Signed   By: Iven Finn M.D.   On: 01/02/2021 17:33   US Venous Img Lower Bilateral (DVT)  Result Date: 01/03/2021 CLINICAL DATA:  Bilateral lower extremity pain EXAM: For bilateral LOWER EXTREMITY VENOUS DOPPLER ULTRASOUND TECHNIQUE: Gray-scale sonography with compression, as well as color and duplex ultrasound, were performed to evaluate the deep venous system(s) from the level of the common femoral vein through the popliteal and proximal calf veins. COMPARISON:  None. FINDINGS: VENOUS Normal compressibility of the common femoral, superficial femoral, and popliteal veins, as well as the visualized calf veins. Visualized portions of profunda femoral vein and great saphenous vein unremarkable. No filling  defects to suggest DVT on grayscale or color Doppler imaging. Doppler waveforms show normal direction of venous flow, normal respiratory plasticity and response to augmentation. Limited views of the contralateral common femoral vein are unremarkable. OTHER None. Limitations: none IMPRESSION: Negative. Electronically Signed   By: Prudencio Pair M.D.   On: 01/03/2021 01:07   DG Chest Port 1 View  Result Date: 01/05/2021 CLINICAL DATA:  Shortness of breath EXAM: PORTABLE CHEST 1 VIEW COMPARISON:  January 04, 2021 chest radiograph and chest CT January 03, 2019 to FINDINGS: There is underlying emphysematous changes, better delineated on CT with scattered areas of fibrosis. There is a small right pleural effusion with ill-defined airspace opacity in the right base, concerning for combination of atelectasis and pneumonia. No new opacity evident. Heart is mildly enlarged with pulmonary vascularity normal. Patient is status  post coronary artery bypass grafting. There is aortic atherosclerosis. No adenopathy. No bone lesions. IMPRESSION: Underlying emphysematous change with areas of fibrosis. Small right pleural effusion with atelectasis and suspected superimposed pneumonia right base. No new opacity. Stable cardiac prominence with postoperative changes. There is aortic atherosclerosis. Aortic Atherosclerosis (ICD10-I70.0) and Emphysema (ICD10-J43.9). Electronically Signed   By: Lowella Grip III M.D.   On: 01/05/2021 10:13   DG Chest Port 1 View  Result Date: 01/04/2021 CLINICAL DATA:  Hypoxia EXAM: PORTABLE CHEST 1 VIEW COMPARISON:  01/02/2021 FINDINGS: Similar probable mild pulmonary edema. Probable trace pleural effusions. No pneumothorax. Stable cardiomegaly. IMPRESSION: Probable mild pulmonary edema and trace pleural effusions similar to prior. Electronically Signed   By: Macy Mis M.D.   On: 01/04/2021 09:13   DG Chest Port 1 View  Result Date: 01/02/2021 CLINICAL DATA:  Coronary artery disease, history of myocardial infarction. Presenting with lower abdominal pain. EXAM: PORTABLE CHEST 1 VIEW COMPARISON:  Chest x-ray 12/30/2017, CT chest 04/19/2007 FINDINGS: Persistent cardiomegaly. Surgical changes related to cardiac surgery. The heart size and mediastinal contours are otherwise unchanged. Aortic arch calcifications. Left lower lobe opacity. Increased interstitial markings. Suggestion of bilateral, left greater than right, at least trace to small volume pleural effusions. No pneumothorax. No acute osseous abnormality. Redemonstration of sternotomy wires that appear fractured. IMPRESSION: 1. Suggestion of bilateral, left greater than right, at least trace to small volume pleural effusions. 2. Mild pulmonary edema in the setting of cardiomegaly with underlying infection/inflammation not excluded. 3. Followup PA and lateral chest X-ray is recommended in 3-4 weeks following therapy to ensure resolution and exclude  underlying malignancy. Electronically Signed   By: Iven Finn M.D.   On: 01/02/2021 21:36    Assessment & Plan     Active Problems:   Atrial flutter (HCC)   Acute diverticulitis    1.  Atrial flutter              -Rate controlled, in the 80s              -Became hypotensive with IV amiodarone; rate now better controlled; will continue with amiodarone at 200mg  BID              -Continue metoprolol tartrate 12.5mg  BID              -Patient wishes to go home; he appears stable from a cardiac standpoint; will discuss risks/benefits of long term anticoagulation upon discharge in an outpatient setting              -Recommend to follow up with Dr. [name] or Doristine Mango PA-C within 1 week of discharge No further  inpatient cardiac workup indicated.   2.  History of coronary artery disease s/p CABG              -Continue aspirin 81mg  daily, pravastatin 40mg  daily, and metoprolol 12.5mg  BID              -No clinical evidence of ischemia   3.  LBBB             -Chronic   Signed, Javier Docker. Fath MD 01/05/2021, 1:06 PM  Pager: [phone]

## 2021-01-06 DIAGNOSIS — K5792 Diverticulitis of intestine, part unspecified, without perforation or abscess without bleeding: Secondary | ICD-10-CM

## 2021-01-06 DIAGNOSIS — R0902 Hypoxemia: Secondary | ICD-10-CM

## 2021-01-06 LAB — CBC WITH DIFFERENTIAL/PLATELET
Abs Immature Granulocytes: 0.05 10*3/uL (ref 0.00–0.07)
Basophils Absolute: 0 10*3/uL (ref 0.0–0.1)
Basophils Relative: 0 %
Eosinophils Absolute: 0 10*3/uL (ref 0.0–0.5)
Eosinophils Relative: 0 %
HCT: 31.8 % — ABNORMAL LOW (ref 39.0–52.0)
Hemoglobin: 9.9 g/dL — ABNORMAL LOW (ref 13.0–17.0)
Immature Granulocytes: 1 %
Lymphocytes Relative: 8 %
Lymphs Abs: 0.9 10*3/uL (ref 0.7–4.0)
MCH: 30.7 pg (ref 26.0–34.0)
MCHC: 31.1 g/dL (ref 30.0–36.0)
MCV: 98.5 fL (ref 80.0–100.0)
Monocytes Absolute: 1.3 10*3/uL — ABNORMAL HIGH (ref 0.1–1.0)
Monocytes Relative: 12 %
Neutro Abs: 8 10*3/uL — ABNORMAL HIGH (ref 1.7–7.7)
Neutrophils Relative %: 79 %
Platelets: 168 10*3/uL (ref 150–400)
RBC: 3.23 MIL/uL — ABNORMAL LOW (ref 4.22–5.81)
RDW: 14.5 % (ref 11.5–15.5)
WBC: 10.2 10*3/uL (ref 4.0–10.5)
nRBC: 0 % (ref 0.0–0.2)

## 2021-01-06 LAB — BASIC METABOLIC PANEL
Anion gap: 8 (ref 5–15)
BUN: 44 mg/dL — ABNORMAL HIGH (ref 8–23)
CO2: 25 mmol/L (ref 22–32)
Calcium: 8.1 mg/dL — ABNORMAL LOW (ref 8.9–10.3)
Chloride: 104 mmol/L (ref 98–111)
Creatinine, Ser: 2.05 mg/dL — ABNORMAL HIGH (ref 0.61–1.24)
GFR, Estimated: 31 mL/min — ABNORMAL LOW (ref >60)
Glucose, Bld: 160 mg/dL — ABNORMAL HIGH (ref 70–99)
Potassium: 5.3 mmol/L — ABNORMAL HIGH (ref 3.5–5.1)
Sodium: 137 mmol/L (ref 135–145)

## 2021-01-06 LAB — MAGNESIUM: Magnesium: 2.2 mg/dL (ref 1.7–2.4)

## 2021-01-06 LAB — GLUCOSE, CAPILLARY
Glucose-Capillary: 39 mg/dL — CL (ref 70–99)
Glucose-Capillary: 74 mg/dL (ref 70–99)
Glucose-Capillary: 140 mg/dL — ABNORMAL HIGH (ref 70–99)
Glucose-Capillary: 172 mg/dL — ABNORMAL HIGH (ref 70–99)

## 2021-01-06 MED ORDER — SPIRIVA HANDIHALER 18 MCG IN CAPS
18.0000 ug | ORAL_CAPSULE | Freq: Every day | RESPIRATORY_TRACT | 2 refills | Status: DC
Start: 2021-01-06 — End: 2021-09-10

## 2021-01-06 MED ORDER — FLUTICASONE PROPIONATE 50 MCG/ACT NA SUSP: 2.0000 | Freq: Every day | NASAL | 0 refills | Status: DC

## 2021-01-06 MED ORDER — AMOXICILLIN-POT CLAVULANATE 500-125 MG PO TABS: 1.0000 | ORAL_TABLET | Freq: Two times a day (BID) | ORAL | 0 refills | Status: AC

## 2021-01-06 MED ORDER — APIXABAN 2.5 MG PO TABS: 2.5000 mg | ORAL_TABLET | Freq: Two times a day (BID) | ORAL | 2 refills | Status: DC

## 2021-01-06 MED ORDER — LEVALBUTEROL TARTRATE 45 MCG/ACT IN AERO: 2.0000 | INHALATION_SPRAY | RESPIRATORY_TRACT | 2 refills | Status: DC | PRN

## 2021-01-06 MED ORDER — INSULIN ASPART 100 UNIT/ML ~~LOC~~ SOLN
0.0000 [IU] | Freq: Three times a day (TID) | SUBCUTANEOUS | Status: DC
  Administered 2021-01-06: 2 [IU] via SUBCUTANEOUS
  Filled 2021-01-06: qty 1

## 2021-01-06 MED ORDER — LORATADINE 10 MG PO TABS: 10.0000 mg | ORAL_TABLET | Freq: Every day | ORAL | 1 refills | Status: DC

## 2021-01-06 MED ORDER — APIXABAN 2.5 MG PO TABS
2.5000 mg | ORAL_TABLET | Freq: Two times a day (BID) | ORAL | Status: DC
  Administered 2021-01-06: 2.5 mg via ORAL
  Filled 2021-01-06: qty 1

## 2021-01-06 MED ORDER — BUDESONIDE-FORMOTEROL FUMARATE 80-4.5 MCG/ACT IN AERO: 2.0000 | INHALATION_SPRAY | Freq: Two times a day (BID) | RESPIRATORY_TRACT | 2 refills | Status: DC

## 2021-01-06 MED ORDER — AMIODARONE HCL 200 MG PO TABS: 200.0000 mg | ORAL_TABLET | Freq: Two times a day (BID) | ORAL | 1 refills | Status: DC

## 2021-01-06 NOTE — Plan of Care (Signed)
  Problem: Education: Goal: Knowledge of General Education information will improve Description: Including pain rating scale, medication(s)/side effects and non-pharmacologic comfort measures 01/06/2021 1505 by Orvan Seen, RN Outcome: Completed/Met 01/06/2021 0810 by Orvan Seen, RN Outcome: Progressing   Problem: Health Behavior/Discharge Planning: Goal: Ability to manage health-related needs will improve 01/06/2021 1505 by Orvan Seen, RN Outcome: Completed/Met 01/06/2021 0810 by Orvan Seen, RN Outcome: Progressing   Problem: Clinical Measurements: Goal: Ability to maintain clinical measurements within normal limits will improve 01/06/2021 1505 by Orvan Seen, RN Outcome: Completed/Met 01/06/2021 0810 by Orvan Seen, RN Outcome: Progressing Goal: Will remain free from infection 01/06/2021 1505 by Orvan Seen, RN Outcome: Completed/Met 01/06/2021 0810 by Orvan Seen, RN Outcome: Progressing Goal: Diagnostic test results will improve 01/06/2021 1505 by Orvan Seen, RN Outcome: Completed/Met 01/06/2021 0810 by Orvan Seen, RN Outcome: Progressing Goal: Respiratory complications will improve 01/06/2021 1505 by Orvan Seen, RN Outcome: Completed/Met 01/06/2021 0810 by Orvan Seen, RN Outcome: Progressing Goal: Cardiovascular complication will be avoided 01/06/2021 1505 by Orvan Seen, RN Outcome: Completed/Met 01/06/2021 0810 by Orvan Seen, RN Outcome: Progressing   Problem: Activity: Goal: Risk for activity intolerance will decrease 01/06/2021 1505 by Orvan Seen, RN Outcome: Completed/Met 01/06/2021 0810 by Orvan Seen, RN Outcome: Progressing   Problem: Nutrition: Goal: Adequate nutrition will be maintained 01/06/2021 1505 by Orvan Seen, RN Outcome: Completed/Met 01/06/2021 0810 by Orvan Seen, RN Outcome: Progressing   Problem: Coping: Goal: Level of anxiety will decrease 01/06/2021  1505 by Orvan Seen, RN Outcome: Completed/Met 01/06/2021 0810 by Orvan Seen, RN Outcome: Progressing   Problem: Elimination: Goal: Will not experience complications related to bowel motility 01/06/2021 1505 by Orvan Seen, RN Outcome: Completed/Met 01/06/2021 0810 by Orvan Seen, RN Outcome: Progressing Goal: Will not experience complications related to urinary retention 01/06/2021 1505 by Orvan Seen, RN Outcome: Completed/Met 01/06/2021 0810 by Orvan Seen, RN Outcome: Progressing   Problem: Pain Managment: Goal: General experience of comfort will improve 01/06/2021 1505 by Orvan Seen, RN Outcome: Completed/Met 01/06/2021 0810 by Orvan Seen, RN Outcome: Progressing   Problem: Safety: Goal: Ability to remain free from injury will improve 01/06/2021 1505 by Orvan Seen, RN Outcome: Completed/Met 01/06/2021 0810 by Orvan Seen, RN Outcome: Progressing   Problem: Skin Integrity: Goal: Risk for impaired skin integrity will decrease 01/06/2021 1505 by Orvan Seen, RN Outcome: Completed/Met 01/06/2021 0810 by Orvan Seen, RN Outcome: Progressing

## 2021-01-06 NOTE — TOC Transition Note (Signed)
Transition of Care [facility]) - CM/SW Discharge Note   Patient Details  Name: Michael Simon MRN: [id number] Date of Birth: Mar 24, 1933  Transition of Care [facility]) CM/SW Contact:  Magnus Ivan, LCSW Phone Number: 01/06/2021, 12:59 PM   Clinical Narrative:   Patient to discharge home today. [facility] [place] arranged for PT, OT, RN, SW, Aide. Informed Representative [place] of SW/Aide being added and DC today. Home o2 ordered through [facility] and will be delivered to bedside prior to DC. No other TOC needs identified.    Final next level of care: [place] Barriers to Discharge: Barriers Resolved   Patient Goals and CMS Choice Patient states their goals for this hospitalization and ongoing recovery are:: home with home health CMS Medicare.gov Compare Post Acute Care list provided to:: Patient Choice offered to / list presented to : [place]  Discharge Placement                Patient to be transferred to facility by: family Name of family member notified: patient and son aware    Discharge Plan and Services                DME Arranged: Oxygen DME Agency: [employer] Date DME Agency Contacted: 01/06/21   Representative spoke with at DME Agency: [place]: PT,OT,RN,[facility] [place] [employer] [place]: [facility] [place] Date [place]: 01/06/21   Representative spoke with at [place]: Jana Half  Social Determinants of Health ([place]) Interventions     Readmission Risk Interventions No flowsheet data found.

## 2021-01-06 NOTE — Progress Notes (Signed)
Patient discharged to home accompanied by son, wheeled of unit by transport with all belongings including O2 therapy equipment.  Discharge instructions and medications reviewed with patient and son.  All questions answered.  All prescriptions given to patient and family to pick up medications at pharmacy of their choice.  PIV removed, no bleeding, intact.  Patient and family verbalized understanding of signs and symptoms of infection.  Patient and family agreed to follow up with setting appointments with cardiologists in one and two weeks and gastroenterologist as listed on AVS.

## 2021-01-06 NOTE — Progress Notes (Signed)
Pt had a hypoglycemic episode. POCT blood glucose was 42mg /dL at 2224. [place] hypoglycemic protocol was followed and pt accepted readily absorbable orange juice. On reassessment, blood glucose 74mg /dL. Pt was alert and oriented x4. Pt was asymptomatic. Pt was served a sandwich tray after the administration of orange juice. Pt reported that he could not remember whether he had eaten dinner or not. Staff encouraged to monitor pt during meal times to ensure adequate nutritional intake.

## 2021-01-06 NOTE — Progress Notes (Addendum)
Physical Therapy Treatment Patient Details Name: Michael Simon MRN: [id number] DOB: Jan 08, 1933 Today's Date: 01/06/2021    History of Present Illness Pt is an 87 y.o. male with medical history significant of CAD s/p MI, HLD, LBBB, DMII , HTN who presents to ED with complaint of lower abdominal pain. Pt noted to be in atrial flutter.    PT Comments    Pt was sitting in recliner upon arriving. He is alert and agreeable to session. On 2 L o2 [place] upon arriving with sao2 96%. Discontinued O2 for PT session however pt did desaturate to  82% during ambulation and 84% while just sitting on rm air.  Reapplied 2 L o2 and pt quickly recovers to 91%. Recommend home o2 for safety. Pt's son does report pt have pulse oximeter at home and will check regularly. Pt overall is progressing well. Ambulate ~ 50 ft with RW. Highly recommend RW at DC. Pt will also benefit from [facility] PT to continue to improve strength and safety with ADL. Acute PT will continue to follow until pt is medically cleared for DC.    Follow Up Recommendations  Home health PT;Supervision/Assistance - 24 hour     Equipment Recommendations  Other (comment) (home O2)    Recommendations for Other Services       Precautions / Restrictions Precautions Precautions: Fall Precaution Comments: Monitor HR/O2 sats Restrictions Weight Bearing Restrictions: Yes    Mobility  Bed Mobility               General bed mobility comments: Pt was in bed pre/post session  Transfers Overall transfer level: Needs assistance Equipment used: Rolling walker (2 wheeled) Transfers: Sit to/from Stand Sit to Stand: Min guard;Supervision         General transfer comment: Min assist to sit > stand but CGA for stand > sit. Son present and will be home assiting  Ambulation/Gait Ambulation/Gait assistance: Min guard Gait Distance (Feet): 50 Feet Assistive device: Rolling walker (2 wheeled) Gait Pattern/deviations: Trunk flexed;Step-through  pattern Gait velocity: decreased   General Gait Details: pt ws able to ambulate with RW without LOB 50 ft. Becomes slightly SOB and desatuates to low 80s without O2. quickly recovers to 90s with 2 L o2 reapplied.       Balance Overall balance assessment: Needs assistance Sitting-balance support: Bilateral upper extremity supported Sitting balance-Leahy Scale: Good Sitting balance - Comments: no LOB in sitting   Standing balance support: Bilateral upper extremity supported;During functional activity Standing balance-Leahy Scale: Fair Standing balance comment: CGA throughotu for safety       Cognition Arousal/Alertness: Awake/alert Behavior During Therapy: WFL for tasks assessed/performed Overall Cognitive Status: Within Functional Limits for tasks assessed        General Comments: Pt is A and O. just needs incraesesd time to respond and process.             Pertinent Vitals/Pain Pain Assessment: No/denies pain Faces Pain Scale: No hurt Pain Location: back pain           PT Goals (current goals can now be found in the care plan section) Acute Rehab PT Goals Patient Stated Goal: To go home Progress towards PT goals: Progressing toward goals    Frequency    Min 2X/week      PT Plan Current plan remains appropriate       AM-PAC PT "6 Clicks" Mobility   Outcome Measure  Help needed turning from your back to your side while in a flat  bed without using bedrails?: A Little Help needed moving from lying on your back to sitting on the side of a flat bed without using bedrails?: A Little Help needed moving to and from a bed to a chair (including a wheelchair)?: A Little Help needed standing up from a chair using your arms (e.g., wheelchair or bedside chair)?: A Little Help needed to walk in hospital room?: A Little Help needed climbing 3-5 steps with a railing? : A Lot (pt has ramp entry into home) 6 Click Score: 17    End of Session Equipment Utilized During  Treatment: Gait belt Activity Tolerance: Patient limited by fatigue Patient left: in chair;with call bell/phone within reach;with chair alarm set;with family/visitor present Nurse Communication: Mobility status PT Visit Diagnosis: Other abnormalities of gait and mobility (R26.89);Muscle weakness (generalized) (M62.81);Difficulty in walking, not elsewhere classified (R26.2)     Time: 3754-3606 PT Time Calculation (min) (ACUTE ONLY): 23 min  Charges:  $Gait Training: 8-22 mins $Therapeutic Activity: 8-22 mins                     Julaine Fusi PTA 01/06/21, 12:19 PM

## 2021-01-06 NOTE — Progress Notes (Incomplete)
PROGRESS NOTE    Michael Simon  YHC:[id number] DOB: 12-24-1932 DOA: 01/02/2021 PCP: [name], MD    Chief Complaint  Patient presents with  . Abdominal Pain    Brief Narrative:  87 y.o.malewith medical history significant ofCAD s/p MI, HLD, LBBB, DMII , HTN who presents to ED with complaint of lower abdominal Pain on the right, described as sharp in nature that started acutely after lunch day of presentation.Patient notes no associated emesis, no diarrhea, no fever/ chills/ or sweats. He notes he was in his normal state of health prior to the onset of abdominal pain. He notes no cough , chest pain , palpitations , dysuria or presyncope. He does Have interim history of bronchitis 2-3 weeks ago but notes his symptoms have completely resolved.  He notes he is fully vaccinated for covid and has gotten his booster.  IV amiodarone attempted yesterday with intolerance due to drop in systolic blood pressure.  Seen by cardiology.  Transition to p.o. amiodarone and attempt to control ventricular rate.  No indication for gastroenterology evaluation at this time.  Case discussed with Dr. Marius Ditch.  Patient had a history of colonoscopy in 2020 without recommendations for repeat.  Received 1 unit of blood for hemoglobin 8.6.    No GI bleed noted however hemoglobin continues to drift down.  Likely secondary to acute diverticulitis.  Patient also now requiring 3 L of oxygen.  Chest x-ray reveals mild pulmonary edema.  I had a conversation with the patient and his son at bedside.  Explained reason for continued hospitalization.  From a cardiac standpoint patient is stable.  Amiodarone changed to oral by cardiology.  Case discussed with Dr. [name].  Defer anticoagulation at this time considering concern for ongoing GI bleed.  Can resume discussion as outpatient   Assessment & Plan:   Principal Problem:   Atrial flutter ([place]) Active Problems:   Diabetes mellitus ([place])   Essential  hypertension   CAD (coronary artery disease)   Acute diverticulitis   Acute respiratory failure with hypoxia ([facility])   Anemia   AKI (acute kidney injury) ([place])  1 acute hypoxic respiratory failure Patient with new O2 requirements currently on 3 L nasal cannula with sats of 95%.  New O2 requirements noted to have developed after admission initially concerning for acute pulmonary edema and probable atrial flutter.  Patient received a dose of Lasix 40 mg IV x1.  Bump in patient's creatinine.  Patient this morning seems visibly short of breath with some minimal expiratory wheezing and some bibasilar crackles.  Chest x-ray obtained with underlying emphysematous change with areas of fibrosis.  Small right pleural effusion with atelectasis and suspected superimposed pneumonia at the right base, no new opacity.  Stable cardiac prominence and postop changes.  Aortic atherosclerosis. We will give patient a 250 cc bolus x1.  Gentle hydration.  Placed on Pulmicort nebs twice daily, Xopenex and Atrovent nebs, Flonase, PPI, Claritin.  Supportive care.  We will need to check ambulatory sats prior to discharge.  Supportive care.  Follow-up.  2.  Acute sigmoid diverticulitis Noted on CT scan.  No signs of abscess or perforation.  Status post 1 unit packed red blood cells hemoglobin was at 8.6 currently at 10.4.  Patient denies any abdominal pain.  Tolerating current diet.  Continue IV Rocephin.  Patient has been transitioned to oral Flagyl.  Supportive care.  Follow-up.  3.  Atrial flutter/A. fib with rapid ventricular response Patient seen in consultation by cardiology.  Initially attempted  amiodarone bolus but patient had a drop in systolic blood pressure.  Currently on oral amiodarone 200 mg twice daily and metoprolol.  Anticoagulation deferred at this time due to concern for possible GI bleed in the setting of diverticular disease.  Anticoagulation conversation may be resumed in the outpatient setting.  Cardiology  following.  4.  Acute anemia Patient noted to have a drop in hemoglobin of 8.6.  No overt bleeding noted.  Status posttransfusion of 1 unit packed red blood cells.  Hemoglobin currently stable at 10.4.  Follow H&H.  Outpatient follow-up.  5.  Vitamin B12 deficiency Continue oral supplementation.  6.  Hypertension ACE inhibitor on hold due to hypotension.  Currently on metoprolol.  Gentle hydration.  Follow.  7.  Acute kidney injury Patient noted to have a creatinine of 2.15 today.  ACE inhibitor on hold.  Patient noted to have had a hypotensive episode after being given bolus of amiodarone.  Patient also received a dose of Lasix 40 mg IV x1 due to concerns for pulmonary edema.  Given normal saline 250 cc bolus x1.  Gentle hydration with normal saline 50 cc an hour for the next 24 hours.  Follow-up.  8.  Type 2 diabetes mellitus with hyperglycemia Hemoglobin A1c 8.2 (01/02/2021).  CBG currently at 195 this morning from 69 earlier on.  Decrease Lantus to 60 units daily.  Sliding scale insulin.  Outpatient follow-up.  9.  History of coronary artery disease status post CABG/chronic left bundle branch block Stable.  Continue aspirin, Pravachol, metoprolol.  Per cardiology.   DVT prophylaxis: SCDs Code Status: Full Family Communication: Updated patient and son at bedside. Disposition:   Status is: Inpatient    Dispo: The patient is from: Home              Anticipated d/c is to: Home with home health therapies and 24/7 supervision.              Anticipated d/c date is: 2 to 3 days              Patient currently with dyspnea with new oxygen requirements not on oxygen prior to admission, on IV antibiotics, atrial flutter, not stable for discharge.       Consultants:   Cardiology: Dr. [name] 01/03/2021  Procedures:   CT abdomen pelvis 01/02/2021  CT angiogram chest 01/03/2021  Chest x-ray 01/02/2021, 01/04/2021, 01/05/2021  Lower extremity Dopplers  01/03/2021    Antimicrobials:  IV Rocephin 01/02/2021>>>>  IV Flagyl 01/02/2021>>>> 01/05/2021  Oral Flagyl 01/05/2021>>>.   Subjective: Seems visibly SOB.  Denies any chest pain.  Denies any significant shortness of breath although visibly short of breath.  No abdominal pain.  Son at bedside.  Objective: Vitals:   01/06/21 0329 01/06/21 0500 01/06/21 0743 01/06/21 1134  BP: 95/81  (!) 108/51 (!) 119/48  Pulse: (!) 55  (!) 57 86  Resp: 18  18 20   Temp: 98.4 F (36.9 C)  98.9 F (37.2 C) 99.4 F (37.4 C)  TempSrc: Oral  Oral Oral  SpO2: (!) 88%  96% 98%  Weight:  83.7 kg    Height:        Intake/Output Summary (Last 24 hours) at 01/06/2021 1143 Last data filed at 01/06/2021 0515 Gross per 24 hour  Intake 615.75 ml  Output -  Net 615.75 ml   Filed Weights   01/02/21 1513 01/05/21 0500 01/06/21 0500  Weight: 79.4 kg 86 kg 83.7 kg    Examination:  General exam:  Visibly short of breath.  Sitting in chair. Respiratory system: Bilateral crackles.  Minimal expiratory wheezing.  Visibly short of breath. Cardiovascular system: Irregularly irregular.no JVD.  No lower extremity edema. Gastrointestinal system: Abdomen is nondistended, soft and nontender. No organomegaly or masses felt. Normal bowel sounds heard. Central nervous system: Alert and oriented. No focal neurological deficits. Extremities: Symmetric 5 x 5 power. Skin: No rashes, lesions or ulcers Psychiatry: Judgement and insight appear normal. Mood & affect appropriate.     Data Reviewed: I have personally reviewed following labs and imaging studies  CBC: Recent Labs  Lab 01/02/21 1538 01/02/21 2130 01/02/21 2236 01/03/21 0513 01/03/21 0919 01/03/21 2011 01/04/21 0756 01/04/21 1729 01/05/21 0422 01/06/21 0418  WBC 9.3 13.5*  --  11.2*  --   --  9.4  --  11.0* 10.2  NEUTROABS 7.4  --   --   --   --   --  8.1*  --  8.8* 8.0*  HGB 11.1* 8.6* 9.0* 10.8*   < > 9.7* 8.7* 10.5* 10.4* 9.9*  HCT 35.0* 28.4*  29.0* 33.8*  --   --  28.3*  --  32.9* 31.8*  MCV 96.4 97.9  --  96.0  --   --  100.7*  --  97.9 98.5  PLT 189 148*  --  153  --   --  156  --  165 168   < > = values in this interval not displayed.    Basic Metabolic Panel: Recent Labs  Lab 01/02/21 2236 01/03/21 0513 01/04/21 0756 01/05/21 0422 01/06/21 0418  NA 141 144 143 139 137  K 4.0 4.2 5.1 4.9 5.3*  CL 106 107 108 104 104  CO2 25 26 22 25 25   GLUCOSE 168* 120* 90 70 160*  BUN 25* 25* 29* 43* 44*  CREATININE 1.34* 1.22 1.56* 2.15* 2.05*  CALCIUM 7.7* 8.0* 8.3* 8.2* 8.1*  MG 1.7  --  2.4  --  2.2  PHOS 3.6  --   --   --   --     GFR: Estimated Creatinine Clearance: 26.3 mL/min (A) (by C-G formula based on SCr of 2.05 mg/dL (H)).  Liver Function Tests: Recent Labs  Lab 01/02/21 1538  AST 20  ALT 18  ALKPHOS 79  BILITOT 0.9  PROT 8.2*  ALBUMIN 4.1    CBG: Recent Labs  Lab 01/05/21 2221 01/05/21 2242 01/05/21 2311 01/06/21 0740 01/06/21 1132  GLUCAP 42* 39* 74 140* 172*     Recent Results (from the past 240 hour(s))  SARS CORONAVIRUS 2 (TAT 6-24 HRS) Nasopharyngeal Nasopharyngeal Swab     Status: None   Collection Time: 01/02/21  8:51 PM   Specimen: Nasopharyngeal Swab  Result Value Ref Range Status   SARS Coronavirus 2 NEGATIVE NEGATIVE Final    Comment: (NOTE) SARS-CoV-2 target nucleic acids are NOT DETECTED.  The SARS-CoV-2 RNA is generally detectable in upper and lower respiratory specimens during the acute phase of infection. Negative results do not preclude SARS-CoV-2 infection, do not rule out co-infections with other pathogens, and should not be used as the sole basis for treatment or other patient management decisions. Negative results must be combined with clinical observations, patient history, and epidemiological information. The expected result is Negative.  Fact Sheet for Patients: SugarRoll.be  Fact Sheet for Healthcare  Providers: https://www.woods-mathews.com/  This test is not yet approved or cleared by the [place] FDA and  has been authorized for detection and/or diagnosis of SARS-CoV-2 by FDA  under an Emergency Use Authorization (EUA). This EUA will remain  in effect (meaning this test can be used) for the duration of the COVID-19 declaration under Se ction 564(b)(1) of the Act, 21 U.S.C. section 360bbb-3(b)(1), unless the authorization is terminated or revoked sooner.  Performed at [facility], [place] [address] [place], [place] [address]   Urine culture     Status: None   Collection Time: 01/03/21  9:19 AM   Specimen: Urine, Random  Result Value Ref Range Status   Specimen Description   Final    URINE, RANDOM Performed at [facility], [address] [place], [place] [address]    Special Requests   Final    NONE Performed at [facility], [address], [place], [place] [address]    Culture   Final    NO GROWTH Performed at [facility], [place] [address], [place], [place] [address]    Report Status 01/05/2021 FINAL  Final         Radiology Studies: DG Chest Port 1 View  Result Date: 01/05/2021 CLINICAL DATA:  Shortness of breath EXAM: PORTABLE CHEST 1 VIEW COMPARISON:  January 04, 2021 chest radiograph and chest CT January 03, 2019 to FINDINGS: There is underlying emphysematous changes, better delineated on CT with scattered areas of fibrosis. There is a small right pleural effusion with ill-defined airspace opacity in the right base, concerning for combination of atelectasis and pneumonia. No new opacity evident. Heart is mildly enlarged with pulmonary vascularity normal. Patient is status post coronary artery bypass grafting. There is aortic atherosclerosis. No adenopathy. No bone lesions. IMPRESSION: Underlying emphysematous change with areas of fibrosis. Small right pleural effusion with atelectasis and suspected superimposed pneumonia  right base. No new opacity. Stable cardiac prominence with postoperative changes. There is aortic atherosclerosis. Aortic Atherosclerosis (ICD10-I70.0) and Emphysema (ICD10-J43.9). Electronically Signed   By: Lowella Grip III M.D.   On: 01/05/2021 10:13        Scheduled Meds: . amiodarone  200 mg Oral BID  . apixaban  2.5 mg Oral BID  . budesonide (PULMICORT) nebulizer solution  0.5 mg Nebulization BID  . digoxin  0.25 mg Intravenous Once  . fluticasone  2 spray Each Nare Daily  . insulin aspart  0-9 Units Subcutaneous TID WC  . ipratropium  0.5 mg Nebulization Q6H  . levalbuterol  0.63 mg Nebulization Q6H  . loratadine  10 mg Oral Daily  . metroNIDAZOLE  500 mg Oral Q8H  . pantoprazole (PROTONIX) IV  40 mg Intravenous Q12H  . pravastatin  40 mg Oral q1800  . vitamin B-12  1,000 mcg Oral Daily   Continuous Infusions: . sodium chloride Stopped (01/06/21 0430)  . cefTRIAXone (ROCEPHIN)  IV Stopped (01/05/21 2316)     LOS: 4 days    Time spent: 34 mins    Irine Seal, MD [facility]   To contact the attending provider between 7A-7P or the covering provider during after hours 7P-7A, please log into the web site [email or web address] and access using universal [place] password for that web site. If you do not have the password, please call the hospital operator.  01/06/2021, 11:43 AM

## 2021-01-06 NOTE — Plan of Care (Signed)

## 2021-01-06 NOTE — Discharge Summary (Signed)
Physician Discharge Summary  Michael Simon TKW:[id number] DOB: 01-21-1933 DOA: 01/02/2021  PCP: Michael Athens, MD  Admit date: 01/02/2021 Discharge date: 01/06/2021  Time spent: 60 minutes  Recommendations for Outpatient Follow-up:  1. Follow-up with Dr. Ubaldo Simon, cardiology in 1 week for follow-up on a flutter/A. Fib. 2. Follow-up with Michael Athens, MD in 2 weeks.  On follow-up patient will need a basic metabolic profile done to follow-up on electrolytes and renal function and CBC.  Patient's diabetes will need to be reassessed.  Patient may need a repeat ambulatory sats on follow-up to see if patient still requires ongoing home oxygen.  Patient may benefit from outpatient referral to pulmonary for formal PFTs due to concerns for probable COPD.  Patient's diverticulitis will need to be followed up upon 3. Follow-up with Dr. Allen Simon, gastroenterology in 2 weeks for follow-up on acute diverticulitis.   Discharge Diagnoses:  Principal Problem:   Atrial flutter ([place]) Active Problems:   Diabetes mellitus ([place])   Essential hypertension   CAD (coronary artery disease)   Acute diverticulitis   Acute respiratory failure with hypoxia (HCC)   Anemia   AKI (acute kidney injury) ([place])   Diverticulitis   Hypoxia   Discharge Condition: Stable and improved  Diet recommendation: Heart healthy/soft diet  Filed Weights   01/02/21 1513 01/05/21 0500 01/06/21 0500  Weight: 79.4 kg 86 kg 83.7 kg    History of present illness:  HPI Per Dr. Aneta Simon is a 87 y.o. male with medical history significant of CAD s/p MI, HLD, LBBB, DMII , HTN who presented to ED with complaint of lower abdominal  Pain on the right, described as sharp in nature that started acutely after lunch day of presentation. Patient noted no associated emesis, no diarrhea, no fever/ chills/ or sweats. He notes he was in his normal state of health prior to the onset of abdominal pain. He notes no cough , chest pain ,  palpitations , dysuria or presyncope.  He does  Have interim history of bronchitis 2-3 weeks ago but notes his symptoms have completely resolved.  He notes he is fully vaccinated for covid and has gotten his booster.    ED Course: In ed vitals:  T97.7 bp 147/58 - 98/60 hr 140 rr 20 , sat 100% on ra  EKG: atrial flutter with rvr Case discussed with Dr Michael Simon Per cardiology recommends starting amiodarone drip Labs: Wbc:9.3, hb 11.1 down from 13.1, closer to prior baseline of 10, MCV 96.4 plt 189 Na: 141, K 4.1 , glu 310, cr 1.39 up from base of 1.17 prior 1.34.  CE 33 TSH 3.698  CT abdomen: 1. Diffuse colonic diverticulosis with uncomplicated sigmoid diverticulitis. No abscess formation or bowel perforation. Recommend colonoscopy status post treatment and status post complete resolution of inflammatory changes to evaluate for an underlying lesion as a malignancy cannot be fully excluded. 2. Bilateral common iliac artery aneurysm measuring up to 1.7 cm. 3. Findings suggestive of chronic pancreatitis. 4. Aortic Atherosclerosis (ICD10-I70.0).1. Diffuse colonic diverticulosis with uncomplicated sigmoid diverticulitis. No abscess formation or bowel perforation. Recommend colonoscopy status post treatment and status post complete resolution of inflammatory changes to evaluate for an underlying lesion as a malignancy cannot be fully excluded. 2. Bilateral common iliac artery aneurysm measuring up to 1.7 cm. 3. Findings suggestive of chronic pancreatitis. 4. Aortic Atherosclerosis (ICD10-I70.0).  Tx; zofran, ns 500cc, morphine 2mg x1  Hospital Course:  1 acute hypoxic respiratory failure Patient with new O2 requirements currently on  2-3 L nasal cannula with sats of 95%.  New O2 requirements noted to have developed after admission initially concerning for acute pulmonary edema and probable atrial flutter.  Patient received a dose of Lasix 40 mg IV x1.  Bump in patient's creatinine.   Patient on the morning of 01/05/2021, seemed visibly short of breath with some minimal expiratory wheezing and some bibasilar crackles.  Chest x-ray obtained with underlying emphysematous change with areas of fibrosis.  Small right pleural effusion with atelectasis and suspected superimposed pneumonia at the right base, no new opacity.  Stable cardiac prominence and postop changes.  Aortic atherosclerosis. Gently hydrated and subsequently placed on Pulmicort nebs twice daily, Xopenex and Atrovent nebs, Flonase, PPI, Claritin with clinical improvement.  Patient be discharged home on Symbicort and Spiriva as well as Xopenex MDI as needed.  Patient likely has a component of COPD which is undiagnosed will need to follow-up with PCP and may benefit from outpatient pulmonary referral for formal PFTs.  Patient will be discharged on home O2 and stable and improved condition.   2.  Acute sigmoid diverticulitis Noted on CT scan.  No signs of abscess or perforation.  Status post 1 unit packed red blood cells hemoglobin stabilized at 9.9 by day of discharge.  Patient improved clinically.  Patient started on a diet which she tolerated.  Patient was on IV Rocephin and IV Flagyl during the hospitalization.  Patient be discharged home on 4 more days of oral Augmentin to complete a 7 to 8-day course of antibiotic treatment.  Outpatient follow-up with PCP and gastroenterology.  3.  Atrial flutter/A. fib with rapid ventricular response Patient seen in consultation by cardiology.  Initially attempted amiodarone bolus but patient had a drop in systolic blood pressure.    Patient subsequently placed on oral amiodarone 200 mg twice daily and metoprolol.    Due to bradycardia metoprolol was discontinued by cardiology on day of discharge.  Patient started on Eliquis 2.5 mg twice daily per cardiology.  Outpatient follow-up with cardiology in 1 week.   4.  Acute anemia Patient noted to have a drop in hemoglobin of 8.6.  No overt  bleeding noted.  Status posttransfusion of 1 unit packed red blood cells.  Hemoglobin stabilized at 9.9 by day of discharge.  Outpatient follow-up with PCP.   5.  Vitamin B12 deficiency Patient maintained on oral supplementation.  6.  Hypertension ACE inhibitor on hold due to hypotension and acute kidney injury.  ACE inhibitor discontinued.  Patient initially on metoprolol however due to bradycardia and borderline blood pressure metoprolol discontinued per cardiology.  Patient hydrated gently with IV fluids.  Outpatient follow-up with PCP and cardiology.  7.  Acute kidney injury Patient noted to have a creatinine of 2.15 on 01/05/2021.  ACE inhibitor held and discontinued on discharge.  Patient noted to have had a hypotensive episode after being given bolus of amiodarone.  Patient also received a dose of Lasix 40 mg IV x1 due to concerns for pulmonary edema.  Given normal saline 250 cc bolus x1.  Gentle hydration with normal saline 50 cc/hr with improvement with renal function such that by day of discharge creatinine was down to 2.05.  Outpatient follow-up with PCP.    8.  Type 2 diabetes mellitus with hyperglycemia Hemoglobin A1c 8.2 (01/02/2021).  Patient maintained on Lantus and dose adjusted during the hospitalization for better blood glucose controls.  Patient also maintained on sliding scale insulin.  Patient be discharged back home on home regimen  of [place] as needed.  Outpatient follow-up.   9.  History of coronary artery disease status post CABG/chronic left bundle branch block Stable.    Patient maintained on aspirin, Pravachol and metoprolol early on in the hospitalization however due to bradycardia metoprolol discontinued per cardiology on day of discharge.  Outpatient follow-up with cardiology.    Procedures:  CT abdomen pelvis 01/02/2021  CT angiogram chest 01/03/2021  Chest x-ray 01/02/2021, 01/04/2021, 01/05/2021  Lower extremity Dopplers  01/03/2021    Consultations:  Cardiology: Dr. Ubaldo Simon 01/03/2021   Discharge Exam: Vitals:   01/06/21 0743 01/06/21 1134  BP: (!) 108/51 (!) 119/48  Pulse: (!) 57 86  Resp: 18 20  Temp: 98.9 F (37.2 C) 99.4 F (37.4 C)  SpO2: 96% 98%    General: NAD Cardiovascular: Irregularly irregular Respiratory: Fine crackles in the bases.  Fair air movement.  No wheezing noted.   Discharge Instructions   Discharge Instructions    Amb referral to [facility]   Complete by: As directed    Diet - low sodium heart healthy   Complete by: As directed    Increase activity slowly   Complete by: As directed      Allergies as of 01/06/2021   No Known Allergies     Medication List    STOP taking these medications   lisinopril 30 MG tablet Commonly known as: ZESTRIL     TAKE these medications   amiodarone 200 MG tablet Commonly known as: PACERONE Take 1 tablet (200 mg total) by mouth 2 (two) times daily.   amoxicillin-clavulanate 500-125 MG tablet Commonly known as: Augmentin Take 1 tablet (500 mg total) by mouth 2 (two) times daily for 4 days.   apixaban 2.5 MG Tabs tablet Commonly known as: ELIQUIS Take 1 tablet (2.5 mg total) by mouth 2 (two) times daily.   budesonide-formoterol 80-4.5 MCG/ACT inhaler Commonly known as: Symbicort Inhale 2 puffs into the lungs 2 (two) times daily.   Cyanocobalamin 1000 MCG Tbcr Take 1,000 mcg by mouth daily.   fluticasone 50 MCG/ACT nasal spray Commonly known as: FLONASE Place 2 sprays into both nostrils daily. Start taking on: January 07, 2021   levalbuterol 45 MCG/ACT inhaler Commonly known as: XOPENEX HFA Inhale 2 puffs into the lungs every 4 (four) hours as needed for wheezing.   loratadine 10 MG tablet Commonly known as: CLARITIN Take 1 tablet (10 mg total) by mouth daily. Start taking on: January 07, 2021   lovastatin 40 MG tablet Commonly known as: MEVACOR Take 1 tablet (40 mg total) by mouth at bedtime.    pantoprazole 40 MG tablet Commonly known as: PROTONIX TAKE 1 TABLET BY MOUTH EVERY DAY   Spiriva HandiHaler 18 MCG inhalation capsule Generic drug: tiotropium Place 1 capsule (18 mcg total) into inhaler and inhale daily.   Tyler Aas FlexTouch 100 UNIT/ML FlexTouch Pen Generic drug: insulin degludec Inject 16 Units into the skin as needed. When blood sugar is over 150            Durable Medical Equipment  (From admission, onward)         Start     Ordered   01/06/21 1213  For home use only DME oxygen  Once       Question Answer Comment  Length of Need 6 Months   Mode or (Route) Nasal cannula   Liters per Minute 2   Frequency Continuous (stationary and portable oxygen unit needed)   Oxygen conserving device Yes   Oxygen  delivery system Gas      01/06/21 1212   01/05/21 0830  For home use only DME 3 n 1  Once        01/05/21 0829         No Known Allergies  Follow-up Information    Lucilla Lame, MD. Schedule an appointment as soon as possible for a visit in 2 week(s).   Specialty: Gastroenterology Contact information: [place]  [place] [address] [phone]        Teodoro Spray, MD. Schedule an appointment as soon as possible for a visit in 1 week(s).   Specialty: Cardiology Contact information: [place] [address] [phone]        Michael Athens, MD. Schedule an appointment as soon as possible for a visit in 2 week(s).   Specialties: Internal Medicine, Cardiology Contact information: [place] [address] [phone]                The results of significant diagnostics from this hospitalization (including imaging, microbiology, ancillary and laboratory) are listed below for reference.    Significant Diagnostic Studies: CT ANGIO CHEST PE W OR WO CONTRAST  Result Date: 01/03/2021 CLINICAL DATA:  Elevated D-dimer with pain EXAM: CT ANGIOGRAPHY CHEST WITH CONTRAST TECHNIQUE: Multidetector  CT imaging of the chest was performed using the standard protocol during bolus administration of intravenous contrast. Multiplanar CT image reconstructions and MIPs were obtained to evaluate the vascular anatomy. CONTRAST:  79mL OMNIPAQUE IOHEXOL 350 MG/ML SOLN COMPARISON:  Chest radiograph January 02, 2021 FINDINGS: Cardiovascular: There is no demonstrable pulmonary embolus. There is no thoracic aortic aneurysm. No dissection is seen; note that the contrast bolus in the aorta is not sufficient for dissection assessment. There are foci of calcification in visualized great vessels. There are multiple foci of aortic atherosclerosis. There are foci of native coronary artery calcification. Patient is status post coronary artery bypass grafting. There is a degree of cardiomegaly. Mediastinum/Nodes: Thyroid is diminutive without focal lesion. There is a precarinal lymph node measuring 1.4 x 1.2 cm. There are several scattered subcentimeter mediastinal lymph nodes as well. No esophageal lesions are appreciable. Lungs/Pleura: There is underlying centrilobular and paraseptal emphysematous change with scattered areas of peripheral fibrosis. There is suspected scarring in the anterior left apex. There is a free-flowing right pleural effusion with atelectasis and mild consolidation in the right lower lobe. There is peripheral lobular septal thickening in the lower lobes, suspicious for a degree of interstitial edema. Upper Abdomen: There is reflux of contrast into the inferior vena cava and hepatic veins. There is a cyst near the junction of the right and left lobes of the liver measuring 3.2 x 2.0 cm. Gallbladder is absent. There is extensive upper abdominal aortic and visualized great vessel atherosclerosis. Musculoskeletal: Patient is status post median sternotomy. Sternal separation noted throughout the sternum despite wire fixation. No blastic or lytic bone lesions. No evident chest wall lesions. Review of the MIP images  confirms the above findings. IMPRESSION: 1. No demonstrable pulmonary embolus. No thoracic aortic aneurysm. No dissection seen with caveat that there is insufficient contrast within the aorta to assess for potential dissection. There is aortic atherosclerosis as well as foci of great vessel and native coronary artery calcification. Patient is status post coronary artery bypass grafting. There is cardiomegaly. 2. Right pleural effusion with right base atelectasis and suspected small focus of pneumonia right base posteriorly. 3. Underlying emphysematous change with scarring and fibrotic type change,  primarily in the upper lobe regions. 4. Suspect lower lobe interstitial pulmonary edema. There may well be a degree of congestive heart failure. 5. Reflux of contrast into the inferior vena cava and hepatic veins, a finding that likely is indicative of increased right heart pressure. 6.  Sternal separation despite wire fixation. 7.  Gallbladder absent. Aortic Atherosclerosis (ICD10-I70.0) and Emphysema (ICD10-J43.9). Electronically Signed   By: Lowella Grip III M.D.   On: 01/03/2021 07:52   CT Abdomen Pelvis W Contrast  Result Date: 01/02/2021 CLINICAL DATA:  Right lower quadrant pain that started today. EXAM: CT ABDOMEN AND PELVIS WITH CONTRAST TECHNIQUE: Multidetector CT imaging of the abdomen and pelvis was performed using the standard protocol following bolus administration of intravenous contrast. CONTRAST:  136mL OMNIPAQUE IOHEXOL 300 MG/ML  SOLN COMPARISON:  None. FINDINGS: Lower chest: Interval development of dependent cystic changes at the right base. Coronary artery calcifications. Hepatobiliary: Similar-appearing multiloculated fluid density lesion within the liver measuring simple fluid density and likely representing a simple hepatic cyst. Redemonstration of several subcentimeter/pericentimeter hypodensities are too small to characterize. Gallbladder is unremarkable. No biliary ductal dilatation.  Pancreas: Pancreatic parenchyma calcifications scattered. No focal pancreatic lesion. Normal pancreatic contour with no definite inflammatory changes surrounding it. No main pancreatic Normal in size without focal abnormality. duct dilatation. Spleen: Normal in size without focal abnormality. Adrenals/Urinary Tract: No adrenal nodule bilaterally. Bilateral kidneys enhance symmetrically. There is a 2.6 cm fluid density lesion within the right kidney that likely represents a simple renal cyst. No hydronephrosis. No hydroureter. The urinary bladder is unremarkable. Stomach/Bowel: Stomach is within normal limits. No evidence of small bowel wall thickening or dilatation. Diffuse colonic diverticulosis. Sigmoid bowel wall thickening with associated trace pericolonic fat stranding. No intramural abscess formation. Appendix appears normal. Vascular/Lymphatic: No abdominal aorta aneurysm. Aneurysmal dilatation of bilateral common iliac arteries measuring up to 1.7 cm bilaterally. Severe atherosclerotic plaque of the aorta and its branches. No abdominal, pelvic, or inguinal lymphadenopathy. Reproductive: Prostate is unremarkable. Other: No intraperitoneal free fluid. No intraperitoneal free gas. No organized fluid collection. Musculoskeletal: Small fat containing umbilical hernia with an abdominal wall defect of 1.3 cm. Levoscoliosis of the lumbar spine centered at the L3-L4 level. No suspicious lytic or blastic osseous lesions. No acute displaced fracture. Multilevel degenerative changes of the spine. IMPRESSION: 1. Diffuse colonic diverticulosis with uncomplicated sigmoid diverticulitis. No abscess formation or bowel perforation. Recommend colonoscopy status post treatment and status post complete resolution of inflammatory changes to evaluate for an underlying lesion as a malignancy cannot be fully excluded. 2. Bilateral common iliac artery aneurysm measuring up to 1.7 cm. 3. Findings suggestive of chronic pancreatitis. 4.   Aortic Atherosclerosis (ICD10-I70.0). Electronically Signed   By: Iven Finn M.D.   On: 01/02/2021 17:33   US Venous Img Lower Bilateral (DVT)  Result Date: 01/03/2021 CLINICAL DATA:  Bilateral lower extremity pain EXAM: For bilateral LOWER EXTREMITY VENOUS DOPPLER ULTRASOUND TECHNIQUE: Gray-scale sonography with compression, as well as color and duplex ultrasound, were performed to evaluate the deep venous system(s) from the level of the common femoral vein through the popliteal and proximal calf veins. COMPARISON:  None. FINDINGS: VENOUS Normal compressibility of the common femoral, superficial femoral, and popliteal veins, as well as the visualized calf veins. Visualized portions of profunda femoral vein and great saphenous vein unremarkable. No filling defects to suggest DVT on grayscale or color Doppler imaging. Doppler waveforms show normal direction of venous flow, normal respiratory plasticity and response to augmentation. Limited views of the contralateral  common femoral vein are unremarkable. OTHER None. Limitations: none IMPRESSION: Negative. Electronically Signed   By: Prudencio Pair M.D.   On: 01/03/2021 01:07   DG Chest Port 1 View  Result Date: 01/05/2021 CLINICAL DATA:  Shortness of breath EXAM: PORTABLE CHEST 1 VIEW COMPARISON:  January 04, 2021 chest radiograph and chest CT January 03, 2019 to FINDINGS: There is underlying emphysematous changes, better delineated on CT with scattered areas of fibrosis. There is a small right pleural effusion with ill-defined airspace opacity in the right base, concerning for combination of atelectasis and pneumonia. No new opacity evident. Heart is mildly enlarged with pulmonary vascularity normal. Patient is status post coronary artery bypass grafting. There is aortic atherosclerosis. No adenopathy. No bone lesions. IMPRESSION: Underlying emphysematous change with areas of fibrosis. Small right pleural effusion with atelectasis and suspected  superimposed pneumonia right base. No new opacity. Stable cardiac prominence with postoperative changes. There is aortic atherosclerosis. Aortic Atherosclerosis (ICD10-I70.0) and Emphysema (ICD10-J43.9). Electronically Signed   By: Lowella Grip III M.D.   On: 01/05/2021 10:13   DG Chest Port 1 View  Result Date: 01/04/2021 CLINICAL DATA:  Hypoxia EXAM: PORTABLE CHEST 1 VIEW COMPARISON:  01/02/2021 FINDINGS: Similar probable mild pulmonary edema. Probable trace pleural effusions. No pneumothorax. Stable cardiomegaly. IMPRESSION: Probable mild pulmonary edema and trace pleural effusions similar to prior. Electronically Signed   By: Macy Mis M.D.   On: 01/04/2021 09:13   DG Chest Port 1 View  Result Date: 01/02/2021 CLINICAL DATA:  Coronary artery disease, history of myocardial infarction. Presenting with lower abdominal pain. EXAM: PORTABLE CHEST 1 VIEW COMPARISON:  Chest x-ray 12/30/2017, CT chest 04/19/2007 FINDINGS: Persistent cardiomegaly. Surgical changes related to cardiac surgery. The heart size and mediastinal contours are otherwise unchanged. Aortic arch calcifications. Left lower lobe opacity. Increased interstitial markings. Suggestion of bilateral, left greater than right, at least trace to small volume pleural effusions. No pneumothorax. No acute osseous abnormality. Redemonstration of sternotomy wires that appear fractured. IMPRESSION: 1. Suggestion of bilateral, left greater than right, at least trace to small volume pleural effusions. 2. Mild pulmonary edema in the setting of cardiomegaly with underlying infection/inflammation not excluded. 3. Followup PA and lateral chest X-ray is recommended in 3-4 weeks following therapy to ensure resolution and exclude underlying malignancy. Electronically Signed   By: Iven Finn M.D.   On: 01/02/2021 21:36    Microbiology: Recent Results (from the past 240 hour(s))  SARS CORONAVIRUS 2 (TAT 6-24 HRS) Nasopharyngeal Nasopharyngeal Swab      Status: None   Collection Time: 01/02/21  8:51 PM   Specimen: Nasopharyngeal Swab  Result Value Ref Range Status   SARS Coronavirus 2 NEGATIVE NEGATIVE Final    Comment: (NOTE) SARS-CoV-2 target nucleic acids are NOT DETECTED.  The SARS-CoV-2 RNA is generally detectable in upper and lower respiratory specimens during the acute phase of infection. Negative results do not preclude SARS-CoV-2 infection, do not rule out co-infections with other pathogens, and should not be used as the sole basis for treatment or other patient management decisions. Negative results must be combined with clinical observations, patient history, and epidemiological information. The expected result is Negative.  Fact Sheet for Patients: SugarRoll.be  Fact Sheet for Healthcare Providers: https://www.woods-mathews.com/  This test is not yet approved or cleared by the [place] FDA and  has been authorized for detection and/or diagnosis of SARS-CoV-2 by FDA under an Emergency Use Authorization (EUA). This EUA will remain  in effect (meaning this test can be used)  for the duration of the COVID-19 declaration under Se ction 564(b)(1) of the Act, 21 U.S.C. section 360bbb-3(b)(1), unless the authorization is terminated or revoked sooner.  Performed at [facility], [place] [address], [place], [place] [address]   Urine culture     Status: None   Collection Time: 01/03/21  9:19 AM   Specimen: Urine, Random  Result Value Ref Range Status   Specimen Description   Final    URINE, RANDOM Performed at [facility], [address] [place], [place] [address]    Special Requests   Final    NONE Performed at [facility], [address] [place], [place] [address]    Culture   Final    NO GROWTH Performed at [facility], [place] [address] [place], [place] [address]    Report Status 01/05/2021 FINAL  Final     Labs: Basic Metabolic  Panel: Recent Labs  Lab 01/02/21 2236 01/03/21 0513 01/04/21 0756 01/05/21 0422 01/06/21 0418  NA 141 144 143 139 137  K 4.0 4.2 5.1 4.9 5.3*  CL 106 107 108 104 104  CO2 25 26 22 25 25   GLUCOSE 168* 120* 90 70 160*  BUN 25* 25* 29* 43* 44*  CREATININE 1.34* 1.22 1.56* 2.15* 2.05*  CALCIUM 7.7* 8.0* 8.3* 8.2* 8.1*  MG 1.7  --  2.4  --  2.2  PHOS 3.6  --   --   --   --    Liver Function Tests: Recent Labs  Lab 01/02/21 1538  AST 20  ALT 18  ALKPHOS 79  BILITOT 0.9  PROT 8.2*  ALBUMIN 4.1   Recent Labs  Lab 01/02/21 1538  LIPASE 28   No results for input(s): AMMONIA in the last 168 hours. CBC: Recent Labs  Lab 01/02/21 1538 01/02/21 2130 01/02/21 2236 01/03/21 0513 01/03/21 0919 01/03/21 2011 01/04/21 0756 01/04/21 1729 01/05/21 0422 01/06/21 0418  WBC 9.3 13.5*  --  11.2*  --   --  9.4  --  11.0* 10.2  NEUTROABS 7.4  --   --   --   --   --  8.1*  --  8.8* 8.0*  HGB 11.1* 8.6* 9.0* 10.8*   < > 9.7* 8.7* 10.5* 10.4* 9.9*  HCT 35.0* 28.4* 29.0* 33.8*  --   --  28.3*  --  32.9* 31.8*  MCV 96.4 97.9  --  96.0  --   --  100.7*  --  97.9 98.5  PLT 189 148*  --  153  --   --  156  --  165 168   < > = values in this interval not displayed.   Cardiac Enzymes: No results for input(s): CKTOTAL, CKMB, CKMBINDEX, TROPONINI in the last 168 hours. BNP: BNP (last 3 results) Recent Labs    01/02/21 2236  BNP 467.7*    ProBNP (last 3 results) No results for input(s): PROBNP in the last 8760 hours.  CBG: Recent Labs  Lab 01/05/21 2221 01/05/21 2242 01/05/21 2311 01/06/21 0740 01/06/21 1132  GLUCAP 42* 39* 74 140* 172*       Signed:  Irine Seal MD.  [facility] 01/06/2021, 12:59 PM

## 2021-01-06 NOTE — Progress Notes (Signed)
Patient Name: Michael Simon Date of Encounter: 01/06/2021  Hospital Problem List     Principal Problem:   Atrial flutter [facility]) Active Problems:   Diabetes mellitus ([place])   Essential hypertension   CAD (coronary artery disease)   Acute diverticulitis   Acute respiratory failure with hypoxia ([place])   Anemia   AKI (acute kidney injury) [facility])    Patient Profile       87 year old male with a past medical history significant for coronary artery disease s/p CABG, a chronic LBBB, type 2 diabetes, hyperlipidemia, and hypertension who presented to the ED on 01/02/21 for severe right lower abdominal pain. CT abdomen revealed diffuse diverticulosis with uncomplicated sigmoid diverticulitis and chronic pancreatitis. Chest CT was negative for a PE with mild pulmonary edema. ECG revealed sinus tachycardia and he given IV amiodarone which caused his BP to drop. High sensitivity troponin borderline elevated, 33, 28, 26, 27 respectively. He is currently sitting up in bed in no acute distress. He denies any recurrent abdominal pain. He denies chest pain, palpitations, heart racing, or shortness of breath.  Subjective   No complaints this morning.   Inpatient Medications    . amiodarone  200 mg Oral BID  . budesonide (PULMICORT) nebulizer solution  0.5 mg Nebulization BID  . digoxin  0.25 mg Intravenous Once  . fluticasone  2 spray Each Nare Daily  . ipratropium  0.5 mg Nebulization Q6H  . levalbuterol  0.63 mg Nebulization Q6H  . loratadine  10 mg Oral Daily  . metoprolol tartrate  12.5 mg Oral BID  . metroNIDAZOLE  500 mg Oral Q8H  . pantoprazole (PROTONIX) IV  40 mg Intravenous Q12H  . pravastatin  40 mg Oral q1800  . vitamin B-12  1,000 mcg Oral Daily    Vital Signs    Vitals:   01/06/21 0051 01/06/21 0329 01/06/21 0500 01/06/21 0743  BP: 112/67 95/81  (!) 108/51  Pulse: 61 (!) 55  (!) 57  Resp: 16 18  18   Temp: 98.9 F (37.2 C) 98.4 F (36.9 C)  98.9 F (37.2 C)   TempSrc: Oral Oral  Oral  SpO2: 98% (!) 88%  96%  Weight:   83.7 kg   Height:        Intake/Output Summary (Last 24 hours) at 01/06/2021 0756 Last data filed at 01/06/2021 0515 Gross per 24 hour  Intake 615.75 ml  Output --  Net 615.75 ml   Filed Weights   01/02/21 1513 01/05/21 0500 01/06/21 0500  Weight: 79.4 kg 86 kg 83.7 kg    Physical Exam    GEN: Well nourished, well developed, in no acute distress.  HEENT: normal.  Neck: Supple, no JVD, carotid bruits, or masses. Cardiac: irr, irr Respiratory:  Respirations regular and unlabored, clear to auscultation bilaterally. GI: Soft, nontender, nondistended, BS + x 4. MS: no deformity or atrophy. Skin: warm and dry, no rash. Neuro:  Non focal. Somewhat lethargic.   Labs    CBC Recent Labs    01/05/21 0422 01/06/21 0418  WBC 11.0* 10.2  NEUTROABS 8.8* 8.0*  HGB 10.4* 9.9*  HCT 32.9* 31.8*  MCV 97.9 98.5  PLT 165 412   Basic Metabolic Panel Recent Labs    01/04/21 0756 01/05/21 0422 01/06/21 0418  NA 143 139 137  K 5.1 4.9 5.3*  CL 108 104 104  CO2 22 25 25   GLUCOSE 90 70 160*  BUN 29* 43* 44*  CREATININE 1.56* 2.15* 2.05*  CALCIUM 8.3* 8.2* 8.1*  MG 2.4  --  2.2   Liver Function Tests No results for input(s): AST, ALT, ALKPHOS, BILITOT, PROT, ALBUMIN in the last 72 hours. No results for input(s): LIPASE, AMYLASE in the last 72 hours. Cardiac Enzymes No results for input(s): CKTOTAL, CKMB, CKMBINDEX, TROPONINI in the last 72 hours. BNP No results for input(s): BNP in the last 72 hours. D-Dimer No results for input(s): DDIMER in the last 72 hours. Hemoglobin A1C No results for input(s): HGBA1C in the last 72 hours. Fasting Lipid Panel No results for input(s): CHOL, HDL, LDLCALC, TRIG, CHOLHDL, LDLDIRECT in the last 72 hours. Thyroid Function Tests No results for input(s): TSH, T4TOTAL, T3FREE, THYROIDAB in the last 72 hours.  Invalid input(s): FREET3  Telemetry    aflutter  ECG        Radiology    CT ANGIO CHEST PE W OR WO CONTRAST  Result Date: 01/03/2021 CLINICAL DATA:  Elevated D-dimer with pain EXAM: CT ANGIOGRAPHY CHEST WITH CONTRAST TECHNIQUE: Multidetector CT imaging of the chest was performed using the standard protocol during bolus administration of intravenous contrast. Multiplanar CT image reconstructions and MIPs were obtained to evaluate the vascular anatomy. CONTRAST:  34mL OMNIPAQUE IOHEXOL 350 MG/ML SOLN COMPARISON:  Chest radiograph January 02, 2021 FINDINGS: Cardiovascular: There is no demonstrable pulmonary embolus. There is no thoracic aortic aneurysm. No dissection is seen; note that the contrast bolus in the aorta is not sufficient for dissection assessment. There are foci of calcification in visualized great vessels. There are multiple foci of aortic atherosclerosis. There are foci of native coronary artery calcification. Patient is status post coronary artery bypass grafting. There is a degree of cardiomegaly. Mediastinum/Nodes: Thyroid is diminutive without focal lesion. There is a precarinal lymph node measuring 1.4 x 1.2 cm. There are several scattered subcentimeter mediastinal lymph nodes as well. No esophageal lesions are appreciable. Lungs/Pleura: There is underlying centrilobular and paraseptal emphysematous change with scattered areas of peripheral fibrosis. There is suspected scarring in the anterior left apex. There is a free-flowing right pleural effusion with atelectasis and mild consolidation in the right lower lobe. There is peripheral lobular septal thickening in the lower lobes, suspicious for a degree of interstitial edema. Upper Abdomen: There is reflux of contrast into the inferior vena cava and hepatic veins. There is a cyst near the junction of the right and left lobes of the liver measuring 3.2 x 2.0 cm. Gallbladder is absent. There is extensive upper abdominal aortic and visualized great vessel atherosclerosis. Musculoskeletal: Patient is  status post median sternotomy. Sternal separation noted throughout the sternum despite wire fixation. No blastic or lytic bone lesions. No evident chest wall lesions. Review of the MIP images confirms the above findings. IMPRESSION: 1. No demonstrable pulmonary embolus. No thoracic aortic aneurysm. No dissection seen with caveat that there is insufficient contrast within the aorta to assess for potential dissection. There is aortic atherosclerosis as well as foci of great vessel and native coronary artery calcification. Patient is status post coronary artery bypass grafting. There is cardiomegaly. 2. Right pleural effusion with right base atelectasis and suspected small focus of pneumonia right base posteriorly. 3. Underlying emphysematous change with scarring and fibrotic type change, primarily in the upper lobe regions. 4. Suspect lower lobe interstitial pulmonary edema. There may well be a degree of congestive heart failure. 5. Reflux of contrast into the inferior vena cava and hepatic veins, a finding that likely is indicative of increased right heart pressure. 6.  Sternal separation  despite wire fixation. 7.  Gallbladder absent. Aortic Atherosclerosis (ICD10-I70.0) and Emphysema (ICD10-J43.9). Electronically Signed   By: Lowella Grip III M.D.   On: 01/03/2021 07:52   CT Abdomen Pelvis W Contrast  Result Date: 01/02/2021 CLINICAL DATA:  Right lower quadrant pain that started today. EXAM: CT ABDOMEN AND PELVIS WITH CONTRAST TECHNIQUE: Multidetector CT imaging of the abdomen and pelvis was performed using the standard protocol following bolus administration of intravenous contrast. CONTRAST:  141mL OMNIPAQUE IOHEXOL 300 MG/ML  SOLN COMPARISON:  None. FINDINGS: Lower chest: Interval development of dependent cystic changes at the right base. Coronary artery calcifications. Hepatobiliary: Similar-appearing multiloculated fluid density lesion within the liver measuring simple fluid density and likely  representing a simple hepatic cyst. Redemonstration of several subcentimeter/pericentimeter hypodensities are too small to characterize. Gallbladder is unremarkable. No biliary ductal dilatation. Pancreas: Pancreatic parenchyma calcifications scattered. No focal pancreatic lesion. Normal pancreatic contour with no definite inflammatory changes surrounding it. No main pancreatic Normal in size without focal abnormality. duct dilatation. Spleen: Normal in size without focal abnormality. Adrenals/Urinary Tract: No adrenal nodule bilaterally. Bilateral kidneys enhance symmetrically. There is a 2.6 cm fluid density lesion within the right kidney that likely represents a simple renal cyst. No hydronephrosis. No hydroureter. The urinary bladder is unremarkable. Stomach/Bowel: Stomach is within normal limits. No evidence of small bowel wall thickening or dilatation. Diffuse colonic diverticulosis. Sigmoid bowel wall thickening with associated trace pericolonic fat stranding. No intramural abscess formation. Appendix appears normal. Vascular/Lymphatic: No abdominal aorta aneurysm. Aneurysmal dilatation of bilateral common iliac arteries measuring up to 1.7 cm bilaterally. Severe atherosclerotic plaque of the aorta and its branches. No abdominal, pelvic, or inguinal lymphadenopathy. Reproductive: Prostate is unremarkable. Other: No intraperitoneal free fluid. No intraperitoneal free gas. No organized fluid collection. Musculoskeletal: Small fat containing umbilical hernia with an abdominal wall defect of 1.3 cm. Levoscoliosis of the lumbar spine centered at the L3-L4 level. No suspicious lytic or blastic osseous lesions. No acute displaced fracture. Multilevel degenerative changes of the spine. IMPRESSION: 1. Diffuse colonic diverticulosis with uncomplicated sigmoid diverticulitis. No abscess formation or bowel perforation. Recommend colonoscopy status post treatment and status post complete resolution of inflammatory changes  to evaluate for an underlying lesion as a malignancy cannot be fully excluded. 2. Bilateral common iliac artery aneurysm measuring up to 1.7 cm. 3. Findings suggestive of chronic pancreatitis. 4.  Aortic Atherosclerosis (ICD10-I70.0). Electronically Signed   By: Iven Finn M.D.   On: 01/02/2021 17:33   US Venous Img Lower Bilateral (DVT)  Result Date: 01/03/2021 CLINICAL DATA:  Bilateral lower extremity pain EXAM: For bilateral LOWER EXTREMITY VENOUS DOPPLER ULTRASOUND TECHNIQUE: Gray-scale sonography with compression, as well as color and duplex ultrasound, were performed to evaluate the deep venous system(s) from the level of the common femoral vein through the popliteal and proximal calf veins. COMPARISON:  None. FINDINGS: VENOUS Normal compressibility of the common femoral, superficial femoral, and popliteal veins, as well as the visualized calf veins. Visualized portions of profunda femoral vein and great saphenous vein unremarkable. No filling defects to suggest DVT on grayscale or color Doppler imaging. Doppler waveforms show normal direction of venous flow, normal respiratory plasticity and response to augmentation. Limited views of the contralateral common femoral vein are unremarkable. OTHER None. Limitations: none IMPRESSION: Negative. Electronically Signed   By: Prudencio Pair M.D.   On: 01/03/2021 01:07   DG Chest Port 1 View  Result Date: 01/05/2021 CLINICAL DATA:  Shortness of breath EXAM: PORTABLE CHEST 1 VIEW COMPARISON:  January 04, 2021 chest radiograph and chest CT January 03, 2019 to FINDINGS: There is underlying emphysematous changes, better delineated on CT with scattered areas of fibrosis. There is a small right pleural effusion with ill-defined airspace opacity in the right base, concerning for combination of atelectasis and pneumonia. No new opacity evident. Heart is mildly enlarged with pulmonary vascularity normal. Patient is status post coronary artery bypass grafting. There  is aortic atherosclerosis. No adenopathy. No bone lesions. IMPRESSION: Underlying emphysematous change with areas of fibrosis. Small right pleural effusion with atelectasis and suspected superimposed pneumonia right base. No new opacity. Stable cardiac prominence with postoperative changes. There is aortic atherosclerosis. Aortic Atherosclerosis (ICD10-I70.0) and Emphysema (ICD10-J43.9). Electronically Signed   By: Lowella Grip III M.D.   On: 01/05/2021 10:13   DG Chest Port 1 View  Result Date: 01/04/2021 CLINICAL DATA:  Hypoxia EXAM: PORTABLE CHEST 1 VIEW COMPARISON:  01/02/2021 FINDINGS: Similar probable mild pulmonary edema. Probable trace pleural effusions. No pneumothorax. Stable cardiomegaly. IMPRESSION: Probable mild pulmonary edema and trace pleural effusions similar to prior. Electronically Signed   By: Macy Mis M.D.   On: 01/04/2021 09:13   DG Chest Port 1 View  Result Date: 01/02/2021 CLINICAL DATA:  Coronary artery disease, history of myocardial infarction. Presenting with lower abdominal pain. EXAM: PORTABLE CHEST 1 VIEW COMPARISON:  Chest x-ray 12/30/2017, CT chest 04/19/2007 FINDINGS: Persistent cardiomegaly. Surgical changes related to cardiac surgery. The heart size and mediastinal contours are otherwise unchanged. Aortic arch calcifications. Left lower lobe opacity. Increased interstitial markings. Suggestion of bilateral, left greater than right, at least trace to small volume pleural effusions. No pneumothorax. No acute osseous abnormality. Redemonstration of sternotomy wires that appear fractured. IMPRESSION: 1. Suggestion of bilateral, left greater than right, at least trace to small volume pleural effusions. 2. Mild pulmonary edema in the setting of cardiomegaly with underlying infection/inflammation not excluded. 3. Followup PA and lateral chest X-ray is recommended in 3-4 weeks following therapy to ensure resolution and exclude underlying malignancy. Electronically Signed    By: Iven Finn M.D.   On: 01/02/2021 21:36    Assessment & Plan     Active Problems: Atrial flutter ([facility]) Acute diverticulitis   1. Atrial flutter  -Rate controlled, in the80s  -Became hypotensive with IV amiodarone; rate now better controlled; will continue with amiodarone at 200mg  BID -Continue metoprolol tartrate 12.5mg  BID  -Patient wishes to go home; he appears stable from a cardiac standpoint; will add Eliquis 2.5 mg bid.  -Recommend to follow up with Dr. [name] or Doristine Mango PA-C within 1 week of discharge No further inpatient cardiac workup indicated.   2. History of coronary artery disease s/p CABG  -Continue aspirin 81mg  daily, pravastatin 40mg  daily, and metoprolol 12.5mg  BID  -No clinical evidence of ischemia   3. LBBB -Chronic   Signed, Javier Docker. Kristelle Cavallaro MD 01/06/2021, 7:56 AM  Pager: [phone]

## 2021-01-08 DIAGNOSIS — M6281 Muscle weakness (generalized): Secondary | ICD-10-CM | POA: Diagnosis not present

## 2021-01-08 DIAGNOSIS — I4892 Unspecified atrial flutter: Secondary | ICD-10-CM | POA: Diagnosis not present

## 2021-01-08 DIAGNOSIS — J9601 Acute respiratory failure with hypoxia: Secondary | ICD-10-CM | POA: Diagnosis not present

## 2021-01-10 ENCOUNTER — Other Ambulatory Visit: Payer: Self-pay | Admitting: *Deleted

## 2021-01-10 NOTE — Patient Outreach (Signed)
[facility]) Care Management  01/10/2021  JOHAAN RYSER [birth year] [id number]   EMMI-GENERAL DISCHARGE-SUCCESSFUL RESOLVED RED ON EMMI ALERT Day #1 Date:01/08/2021 Red Alert Reason: NO FOLLOW UP SCHEDULE  OUTREACH #1 RN spoke with the pt's live in daughter who verified all appointments and sufficient transportation. Emmi resolved with no additional needs. RN further offered [facility] services for case management needs however opt to decline at this time indicated no other needs. Provider information on how to contact [facility] if future services are needed.   Will closed resolved emmi with no additional needs.  Raina Mina, RN Care Management Coordinator [place] Office [phone]

## 2021-01-11 ENCOUNTER — Ambulatory Visit (INDEPENDENT_AMBULATORY_CARE_PROVIDER_SITE_OTHER): Payer: Medicare HMO | Admitting: Internal Medicine

## 2021-01-11 ENCOUNTER — Encounter: Payer: Self-pay | Admitting: Internal Medicine

## 2021-01-11 ENCOUNTER — Other Ambulatory Visit: Payer: Self-pay

## 2021-01-11 VITALS — BP 125/75 | HR 114

## 2021-01-11 DIAGNOSIS — I483 Typical atrial flutter: Secondary | ICD-10-CM | POA: Diagnosis not present

## 2021-01-11 DIAGNOSIS — E1159 Type 2 diabetes mellitus with other circulatory complications: Secondary | ICD-10-CM | POA: Diagnosis not present

## 2021-01-11 DIAGNOSIS — R0600 Dyspnea, unspecified: Secondary | ICD-10-CM | POA: Diagnosis not present

## 2021-01-11 DIAGNOSIS — K5792 Diverticulitis of intestine, part unspecified, without perforation or abscess without bleeding: Secondary | ICD-10-CM | POA: Diagnosis not present

## 2021-01-11 DIAGNOSIS — J9601 Acute respiratory failure with hypoxia: Secondary | ICD-10-CM | POA: Diagnosis not present

## 2021-01-11 DIAGNOSIS — Z794 Long term (current) use of insulin: Secondary | ICD-10-CM | POA: Diagnosis not present

## 2021-01-11 DIAGNOSIS — I10 Essential (primary) hypertension: Secondary | ICD-10-CM | POA: Diagnosis not present

## 2021-01-11 DIAGNOSIS — R0902 Hypoxemia: Secondary | ICD-10-CM

## 2021-01-11 MED ORDER — FUROSEMIDE 10 MG/ML IJ SOLN
20.0000 mg | Freq: Once | INTRAMUSCULAR | Status: AC
Start: 2021-01-11 — End: 2021-01-11
  Administered 2021-01-11: 20 mg via INTRAMUSCULAR

## 2021-01-11 MED ORDER — PEN NEEDLES 33G X 4 MM MISC: 6 refills | Status: DC

## 2021-01-11 MED ORDER — ALBUTEROL SULFATE HFA 108 (90 BASE) MCG/ACT IN AERS: 2.0000 | INHALATION_SPRAY | Freq: Four times a day (QID) | RESPIRATORY_TRACT | 0 refills | Status: DC | PRN

## 2021-01-11 MED ORDER — FUROSEMIDE 20 MG PO TABS: 20.0000 mg | ORAL_TABLET | Freq: Every day | ORAL | 3 refills | Status: DC

## 2021-01-12 ENCOUNTER — Ambulatory Visit: Payer: Medicare HMO | Admitting: Internal Medicine

## 2021-01-13 ENCOUNTER — Encounter: Payer: Self-pay | Admitting: Internal Medicine

## 2021-01-13 NOTE — Assessment & Plan Note (Signed)
Hypoxia has resolved he is wearing 2 L of oxygen per minute

## 2021-01-13 NOTE — Progress Notes (Signed)
Established Patient Office Visit  Subjective:  Patient ID: Michael Simon, male    DOB: May 29, 1933  Age: 87 y.o. MRN: [id number]  CC:  Chief Complaint  Patient presents with  . Hospitalization Follow-up    Patient was admitted for diverticulitis.     HPI  [name] presents for shortness of breath and rhonchi. He has been seen in the hospital recently denies chest pain. Patient is known to have coronary artery disease with left bundle block and has a history of myocardial infarction in the distant past. Is also known to have osteoarthritis and diabetes mellitus    Past Surgical History:  Procedure Laterality Date  . CARDIAC CATHETERIZATION    . CATARACT EXTRACTION    . COLONOSCOPY    . COLONOSCOPY WITH PROPOFOL N/A 08/26/2019   Procedure: COLONOSCOPY WITH PROPOFOL;  Surgeon: [name], MD;  Location: [facility] ENDOSCOPY;  Service: Gastroenterology;  Laterality: N/A;  . COLONOSCOPY WITH PROPOFOL N/A 09/22/2019   Procedure: COLONOSCOPY WITH PROPOFOL;  Surgeon: [name], MD;  Location: [facility] ENDOSCOPY;  Service: Gastroenterology;  Laterality: N/A;  pediatric colonoscope needed  . CORONARY ARTERY BYPASS GRAFT  2012  . EYE SURGERY    . INGUINAL HERNIA REPAIR    . JOINT REPLACEMENT     knees    Family History  Problem Relation Age of Onset  . Arthritis Mother   . Heart disease Mother   . Heart disease Other     Social History   Socioeconomic History  . Marital status: Married    Spouse name: Not on file  . Number of children: Not on file  . Years of education: Not on file  . Highest education level: Not on file  Occupational History  . Not on file  Tobacco Use  . Smoking status: Former Smoker    Quit date: 03/03/2008    Years since quitting: 12.8  . Smokeless tobacco: Never Used  Vaping Use  . Vaping Use: Never used  Substance and Sexual Activity  . Alcohol use: No    Alcohol/week: 0.0 standard drinks  . Drug use: No  . Sexual activity: Not on file   Other Topics Concern  . Not on file  Social History Narrative  . Not on file   Social Determinants of Health   Financial Resource Strain: Not on file  Food Insecurity: Not on file  Transportation Needs: Not on file  Physical Activity: Not on file  Stress: Not on file  Social Connections: Not on file  Intimate Partner Violence: Not on file     Current Outpatient Medications:  .  albuterol (VENTOLIN HFA) 108 (90 Base) MCG/ACT inhaler, Inhale 2 puffs into the lungs every 6 (six) hours as needed for wheezing or shortness of breath., Disp: 8 g, Rfl: 0 .  furosemide (LASIX) 20 MG tablet, Take 1 tablet (20 mg total) by mouth daily., Disp: 30 tablet, Rfl: 3 .  Insulin Pen Needle (PEN NEEDLES) 33G X 4 MM MISC, Use to administer insulin daily, Disp: 100 each, Rfl: 6 .  amiodarone (PACERONE) 200 MG tablet, Take 1 tablet (200 mg total) by mouth 2 (two) times daily., Disp: 60 tablet, Rfl: 1 .  apixaban (ELIQUIS) 2.5 MG TABS tablet, Take 1 tablet (2.5 mg total) by mouth 2 (two) times daily., Disp: 60 tablet, Rfl: 2 .  budesonide-formoterol (SYMBICORT) 80-4.5 MCG/ACT inhaler, Inhale 2 puffs into the lungs 2 (two) times daily., Disp: 1 each, Rfl: 2 .  Cyanocobalamin 1000 MCG TBCR,  Take 1,000 mcg by mouth daily. , Disp: , Rfl:  .  fluticasone (FLONASE) 50 MCG/ACT nasal spray, Place 2 sprays into both nostrils daily., Disp: 9.9 mL, Rfl: 0 .  levalbuterol (XOPENEX HFA) 45 MCG/ACT inhaler, Inhale 2 puffs into the lungs every 4 (four) hours as needed for wheezing., Disp: 1 each, Rfl: 2 .  loratadine (CLARITIN) 10 MG tablet, Take 1 tablet (10 mg total) by mouth daily., Disp: 30 tablet, Rfl: 1 .  lovastatin (MEVACOR) 40 MG tablet, Take 1 tablet (40 mg total) by mouth at bedtime., Disp: 30 tablet, Rfl: 6 .  pantoprazole (PROTONIX) 40 MG tablet, TAKE 1 TABLET BY MOUTH EVERY DAY, Disp: 90 tablet, Rfl: 3 .  tiotropium (SPIRIVA HANDIHALER) 18 MCG inhalation capsule, Place 1 capsule (18 mcg total) into inhaler  and inhale daily., Disp: 30 capsule, Rfl: 2 .  TRESIBA FLEXTOUCH 100 UNIT/ML FlexTouch Pen, Inject 16 Units into the skin as needed. When blood sugar is over 150, Disp: , Rfl:    No Known Allergies  ROS Review of Systems  Constitutional: Negative.   HENT: Negative.  Negative for facial swelling.   Eyes: Negative.   Respiratory: Positive for chest tightness and shortness of breath.   Cardiovascular: Negative.  Negative for chest pain.  Gastrointestinal: Negative.  Negative for abdominal pain.  Endocrine: Negative.   Genitourinary: Negative.  Negative for difficulty urinating and frequency.  Musculoskeletal: Negative.   Skin: Negative.   Allergic/Immunologic: Negative.   Neurological: Negative.   Hematological: Negative.   Psychiatric/Behavioral: Negative.   All other systems reviewed and are negative.     Objective:    Physical Exam Vitals reviewed.  Constitutional:      Appearance: Normal appearance.  HENT:     Mouth/Throat:     Mouth: Mucous membranes are moist.  Eyes:     Pupils: Pupils are equal, round, and reactive to light.  Neck:     Vascular: No carotid bruit.  Cardiovascular:     Rate and Rhythm: Normal rate and regular rhythm.     Pulses: Normal pulses.     Heart sounds: Normal heart sounds.  Pulmonary:     Effort: Pulmonary effort is normal.     Breath sounds: Normal breath sounds.  Abdominal:     General: Bowel sounds are normal.     Palpations: Abdomen is soft. There is no hepatomegaly, splenomegaly or mass.     Tenderness: There is no abdominal tenderness.     Hernia: No hernia is present.  Musculoskeletal:     Cervical back: Neck supple.     Right lower leg: No edema.     Left lower leg: No edema.  Skin:    Findings: No rash.  Neurological:     Mental Status: He is alert and oriented to person, place, and time.     Motor: No weakness.  Psychiatric:        Mood and Affect: Mood normal.        Behavior: Behavior normal.     BP 125/75    Pulse (!) 114   SpO2 98% Comment: 2 liters Wt Readings from Last 3 Encounters:  01/06/21 184 lb 9.6 oz (83.7 kg)  12/21/20 171 lb 4.8 oz (77.7 kg)  12/07/20 171 lb 11.2 oz (77.9 kg)     Health Maintenance Due  Topic Date Due  . COVID-19 Vaccine (1) Never done  . FOOT EXAM  Never done  . OPHTHALMOLOGY EXAM  Never done  . URINE MICROALBUMIN  Never done  . TETANUS/TDAP  Never done  . PNA vac Low Risk Adult (2 of 2 - PCV13) 03/10/2012  . INFLUENZA VACCINE  07/18/2020    There are no preventive care reminders to display for this patient.  Lab Results  Component Value Date   TSH 3.698 01/02/2021   Lab Results  Component Value Date   WBC 10.2 01/06/2021   HGB 9.9 (L) 01/06/2021   HCT 31.8 (L) 01/06/2021   MCV 98.5 01/06/2021   PLT 168 01/06/2021   Lab Results  Component Value Date   NA 137 01/06/2021   K 5.3 (H) 01/06/2021   CO2 25 01/06/2021   GLUCOSE 160 (H) 01/06/2021   BUN 44 (H) 01/06/2021   CREATININE 2.05 (H) 01/06/2021   BILITOT 0.9 01/02/2021   ALKPHOS 79 01/02/2021   AST 20 01/02/2021   ALT 18 01/02/2021   PROT 8.2 (H) 01/02/2021   ALBUMIN 4.1 01/02/2021   CALCIUM 8.1 (L) 01/06/2021   ANIONGAP 8 01/06/2021   No results found for: CHOL No results found for: HDL No results found for: LDLCALC No results found for: TRIG No results found for: CHOLHDL Lab Results  Component Value Date   HGBA1C 8.2 (H) 01/02/2021      Assessment & Plan:   Problem List Items Addressed This Visit      Cardiovascular and Mediastinum   Primary hypertension   Relevant Medications   furosemide (LASIX) 20 MG tablet   Atrial flutter (HCC)    Stable at the present time.      Relevant Medications   furosemide (LASIX) 20 MG tablet     Respiratory   Acute respiratory failure with hypoxia (HCC)    Hypoxia has resolved he is wearing 2 L of oxygen per minute      Hypoxia     Digestive   Acute diverticulitis    On examination today his abdomen was nontender there was  no evidence of fluid or rebound tenderness on examination of abdomen        Endocrine   Diabetes mellitus ([place])    Blood sugar is under control on Tresiba        Other   Diverticulitis    Diverticulitis has resolved but he still anemic and will follow it up      Dyspnea - Primary    Dyspnea and shortness of breath is under control on oxygen. He was not found to be wheezing today      Relevant Medications   furosemide (LASIX) 20 MG tablet   Other Relevant Orders   EKG 12-Lead (Completed)    Report of the electrocardiogram  EKG shows intraventricular conduction defect and also atrial flutter.  Nonspecific ST-T abnormalities noted  Meds ordered this encounter  Medications  . furosemide (LASIX) 20 MG tablet    Sig: Take 1 tablet (20 mg total) by mouth daily.    Dispense:  30 tablet    Refill:  3  . furosemide (LASIX) injection 20 mg  . albuterol (VENTOLIN HFA) 108 (90 Base) MCG/ACT inhaler    Sig: Inhale 2 puffs into the lungs every 6 (six) hours as needed for wheezing or shortness of breath.    Dispense:  8 g    Refill:  0  . Insulin Pen Needle (PEN NEEDLES) 33G X 4 MM MISC    Sig: Use to administer insulin daily    Dispense:  100 each    Refill:  6    Follow-up: No follow-ups  on file.    [name], MD

## 2021-01-13 NOTE — Assessment & Plan Note (Signed)
Diverticulitis has resolved but he still anemic and will follow it up

## 2021-01-13 NOTE — Assessment & Plan Note (Signed)
Stable at the present time. 

## 2021-01-13 NOTE — Assessment & Plan Note (Signed)
Blood sugar is under control on Tresiba

## 2021-01-13 NOTE — Assessment & Plan Note (Signed)
On examination today his abdomen was nontender there was no evidence of fluid or rebound tenderness on examination of abdomen

## 2021-01-13 NOTE — Assessment & Plan Note (Signed)
Dyspnea and shortness of breath is under control on oxygen. He was not found to be wheezing today

## 2021-01-14 ENCOUNTER — Ambulatory Visit: Payer: Medicare HMO | Admitting: Family Medicine

## 2021-01-14 DIAGNOSIS — I4891 Unspecified atrial fibrillation: Secondary | ICD-10-CM | POA: Diagnosis not present

## 2021-01-14 DIAGNOSIS — I2510 Atherosclerotic heart disease of native coronary artery without angina pectoris: Secondary | ICD-10-CM | POA: Diagnosis not present

## 2021-01-14 DIAGNOSIS — K5730 Diverticulosis of large intestine without perforation or abscess without bleeding: Secondary | ICD-10-CM | POA: Diagnosis not present

## 2021-01-14 DIAGNOSIS — E119 Type 2 diabetes mellitus without complications: Secondary | ICD-10-CM | POA: Diagnosis not present

## 2021-01-14 DIAGNOSIS — E538 Deficiency of other specified B group vitamins: Secondary | ICD-10-CM | POA: Diagnosis not present

## 2021-01-14 DIAGNOSIS — I10 Essential (primary) hypertension: Secondary | ICD-10-CM | POA: Diagnosis not present

## 2021-01-14 DIAGNOSIS — I4892 Unspecified atrial flutter: Secondary | ICD-10-CM | POA: Diagnosis not present

## 2021-01-14 DIAGNOSIS — J439 Emphysema, unspecified: Secondary | ICD-10-CM | POA: Diagnosis not present

## 2021-01-14 DIAGNOSIS — D649 Anemia, unspecified: Secondary | ICD-10-CM | POA: Diagnosis not present

## 2021-01-17 DIAGNOSIS — E119 Type 2 diabetes mellitus without complications: Secondary | ICD-10-CM | POA: Diagnosis not present

## 2021-01-17 DIAGNOSIS — I2510 Atherosclerotic heart disease of native coronary artery without angina pectoris: Secondary | ICD-10-CM | POA: Diagnosis not present

## 2021-01-17 DIAGNOSIS — J439 Emphysema, unspecified: Secondary | ICD-10-CM | POA: Diagnosis not present

## 2021-01-17 DIAGNOSIS — I4891 Unspecified atrial fibrillation: Secondary | ICD-10-CM | POA: Diagnosis not present

## 2021-01-17 DIAGNOSIS — E538 Deficiency of other specified B group vitamins: Secondary | ICD-10-CM | POA: Diagnosis not present

## 2021-01-17 DIAGNOSIS — K5730 Diverticulosis of large intestine without perforation or abscess without bleeding: Secondary | ICD-10-CM | POA: Diagnosis not present

## 2021-01-17 DIAGNOSIS — D649 Anemia, unspecified: Secondary | ICD-10-CM | POA: Diagnosis not present

## 2021-01-17 DIAGNOSIS — I4892 Unspecified atrial flutter: Secondary | ICD-10-CM | POA: Diagnosis not present

## 2021-01-17 DIAGNOSIS — I10 Essential (primary) hypertension: Secondary | ICD-10-CM | POA: Diagnosis not present

## 2021-01-18 DIAGNOSIS — I4892 Unspecified atrial flutter: Secondary | ICD-10-CM | POA: Diagnosis not present

## 2021-01-18 DIAGNOSIS — K5730 Diverticulosis of large intestine without perforation or abscess without bleeding: Secondary | ICD-10-CM | POA: Diagnosis not present

## 2021-01-18 DIAGNOSIS — I10 Essential (primary) hypertension: Secondary | ICD-10-CM | POA: Diagnosis not present

## 2021-01-18 DIAGNOSIS — I4891 Unspecified atrial fibrillation: Secondary | ICD-10-CM | POA: Diagnosis not present

## 2021-01-18 DIAGNOSIS — D649 Anemia, unspecified: Secondary | ICD-10-CM | POA: Diagnosis not present

## 2021-01-18 DIAGNOSIS — E538 Deficiency of other specified B group vitamins: Secondary | ICD-10-CM | POA: Diagnosis not present

## 2021-01-18 DIAGNOSIS — J439 Emphysema, unspecified: Secondary | ICD-10-CM | POA: Diagnosis not present

## 2021-01-18 DIAGNOSIS — E119 Type 2 diabetes mellitus without complications: Secondary | ICD-10-CM | POA: Diagnosis not present

## 2021-01-18 DIAGNOSIS — I2510 Atherosclerotic heart disease of native coronary artery without angina pectoris: Secondary | ICD-10-CM | POA: Diagnosis not present

## 2021-01-19 ENCOUNTER — Other Ambulatory Visit: Payer: Self-pay

## 2021-01-19 ENCOUNTER — Ambulatory Visit (INDEPENDENT_AMBULATORY_CARE_PROVIDER_SITE_OTHER): Payer: Medicare HMO | Admitting: Internal Medicine

## 2021-01-19 ENCOUNTER — Encounter: Payer: Self-pay | Admitting: Internal Medicine

## 2021-01-19 VITALS — BP 146/73 | HR 114 | Ht 69.0 in | Wt 173.8 lb

## 2021-01-19 DIAGNOSIS — E119 Type 2 diabetes mellitus without complications: Secondary | ICD-10-CM | POA: Diagnosis not present

## 2021-01-19 DIAGNOSIS — E1159 Type 2 diabetes mellitus with other circulatory complications: Secondary | ICD-10-CM | POA: Diagnosis not present

## 2021-01-19 DIAGNOSIS — I2510 Atherosclerotic heart disease of native coronary artery without angina pectoris: Secondary | ICD-10-CM | POA: Diagnosis not present

## 2021-01-19 DIAGNOSIS — I10 Essential (primary) hypertension: Secondary | ICD-10-CM

## 2021-01-19 DIAGNOSIS — J439 Emphysema, unspecified: Secondary | ICD-10-CM | POA: Diagnosis not present

## 2021-01-19 DIAGNOSIS — K5730 Diverticulosis of large intestine without perforation or abscess without bleeding: Secondary | ICD-10-CM | POA: Diagnosis not present

## 2021-01-19 DIAGNOSIS — Z794 Long term (current) use of insulin: Secondary | ICD-10-CM

## 2021-01-19 DIAGNOSIS — R0902 Hypoxemia: Secondary | ICD-10-CM

## 2021-01-19 DIAGNOSIS — I4892 Unspecified atrial flutter: Secondary | ICD-10-CM | POA: Diagnosis not present

## 2021-01-19 DIAGNOSIS — E538 Deficiency of other specified B group vitamins: Secondary | ICD-10-CM | POA: Diagnosis not present

## 2021-01-19 DIAGNOSIS — I483 Typical atrial flutter: Secondary | ICD-10-CM | POA: Diagnosis not present

## 2021-01-19 DIAGNOSIS — D649 Anemia, unspecified: Secondary | ICD-10-CM | POA: Diagnosis not present

## 2021-01-19 DIAGNOSIS — I4891 Unspecified atrial fibrillation: Secondary | ICD-10-CM | POA: Diagnosis not present

## 2021-01-19 NOTE — Assessment & Plan Note (Signed)
Patient is on amiodarone for atrial flutter fib.  She is still a little bit fast today blood pressure is under control.  Blood sugar is coming down.  He was advised to follow low-salt diet

## 2021-01-19 NOTE — Assessment & Plan Note (Signed)
-   I encouraged the patient to regularly check blood sugar.  - I encouraged the patient to monitor diet. I encouraged the patient to eat low-carb and low-sugar to help prevent blood sugar spikes.  - I encouraged the patient to continue following their prescribed treatment plan for diabetes - I informed the patient to get help if blood sugar drops below 54mg/dL, or if suddenly have trouble thinking clearly or breathing.     

## 2021-01-19 NOTE — Assessment & Plan Note (Signed)
Resolved

## 2021-01-19 NOTE — Assessment & Plan Note (Signed)
Coronary artery disease stable at the present time.  His heart is irregular chest decreased breath sound bilaterally  Congestive heart failure seems to be under control he has right leg swelling without any tenderness.

## 2021-01-19 NOTE — Progress Notes (Signed)
Established Patient Office Visit  Subjective:  Patient ID: Michael Simon, male    DOB: 12/10/1933  Age: 87 y.o. MRN: [id number]  CC:  Chief Complaint  Patient presents with  . Diabetes    BS 189 this am     HPI  Michael Simon presents for reg  Check up  Past Medical History:  Diagnosis Date  . CAD (coronary artery disease)   . HLD (hyperlipidemia)   . HTN (hypertension)   . LBBB (left bundle branch block)   . Myocardial infarction ([place])   . Osteoarthritis   . Type II diabetes mellitus ([place])     Past Surgical History:  Procedure Laterality Date  . CARDIAC CATHETERIZATION    . CATARACT EXTRACTION    . COLONOSCOPY    . COLONOSCOPY WITH PROPOFOL N/A 08/26/2019   Procedure: COLONOSCOPY WITH PROPOFOL;  Surgeon: [name], MD;  Location: [facility] ENDOSCOPY;  Service: Gastroenterology;  Laterality: N/A;  . COLONOSCOPY WITH PROPOFOL N/A 09/22/2019   Procedure: COLONOSCOPY WITH PROPOFOL;  Surgeon: [name], MD;  Location: [facility] ENDOSCOPY;  Service: Gastroenterology;  Laterality: N/A;  pediatric colonoscope needed  . CORONARY ARTERY BYPASS GRAFT  2012  . EYE SURGERY    . INGUINAL HERNIA REPAIR    . JOINT REPLACEMENT     knees    Family History  Problem Relation Age of Onset  . Arthritis Mother   . Heart disease Mother   . Heart disease Other     Social History   Socioeconomic History  . Marital status: Married    Spouse name: Not on file  . Number of children: Not on file  . Years of education: Not on file  . Highest education level: Not on file  Occupational History  . Not on file  Tobacco Use  . Smoking status: Former Smoker    Quit date: 03/03/2008    Years since quitting: 12.8  . Smokeless tobacco: Never Used  Vaping Use  . Vaping Use: Never used  Substance and Sexual Activity  . Alcohol use: No    Alcohol/week: 0.0 standard drinks  . Drug use: No  . Sexual activity: Not on file  Other Topics Concern  . Not on file  Social History  Narrative  . Not on file   Social Determinants of Health   Financial Resource Strain: Not on file  Food Insecurity: Not on file  Transportation Needs: Not on file  Physical Activity: Not on file  Stress: Not on file  Social Connections: Not on file  Intimate Partner Violence: Not on file     Current Outpatient Medications:  .  albuterol (VENTOLIN HFA) 108 (90 Base) MCG/ACT inhaler, Inhale 2 puffs into the lungs every 6 (six) hours as needed for wheezing or shortness of breath., Disp: 8 g, Rfl: 0 .  amiodarone (PACERONE) 200 MG tablet, Take 1 tablet (200 mg total) by mouth 2 (two) times daily., Disp: 60 tablet, Rfl: 1 .  apixaban (ELIQUIS) 2.5 MG TABS tablet, Take 1 tablet (2.5 mg total) by mouth 2 (two) times daily., Disp: 60 tablet, Rfl: 2 .  budesonide-formoterol (SYMBICORT) 80-4.5 MCG/ACT inhaler, Inhale 2 puffs into the lungs 2 (two) times daily., Disp: 1 each, Rfl: 2 .  Cyanocobalamin 1000 MCG TBCR, Take 1,000 mcg by mouth daily. , Disp: , Rfl:  .  fluticasone (FLONASE) 50 MCG/ACT nasal spray, Place 2 sprays into both nostrils daily., Disp: 9.9 mL, Rfl: 0 .  furosemide (LASIX) 20 MG tablet, Take  1 tablet (20 mg total) by mouth daily., Disp: 30 tablet, Rfl: 3 .  Insulin Pen Needle (PEN NEEDLES) 33G X 4 MM MISC, Use to administer insulin daily, Disp: 100 each, Rfl: 6 .  levalbuterol (XOPENEX HFA) 45 MCG/ACT inhaler, Inhale 2 puffs into the lungs every 4 (four) hours as needed for wheezing., Disp: 1 each, Rfl: 2 .  loratadine (CLARITIN) 10 MG tablet, Take 1 tablet (10 mg total) by mouth daily., Disp: 30 tablet, Rfl: 1 .  lovastatin (MEVACOR) 40 MG tablet, Take 1 tablet (40 mg total) by mouth at bedtime., Disp: 30 tablet, Rfl: 6 .  pantoprazole (PROTONIX) 40 MG tablet, TAKE 1 TABLET BY MOUTH EVERY DAY, Disp: 90 tablet, Rfl: 3 .  tiotropium (SPIRIVA HANDIHALER) 18 MCG inhalation capsule, Place 1 capsule (18 mcg total) into inhaler and inhale daily., Disp: 30 capsule, Rfl: 2 .  TRESIBA  FLEXTOUCH 100 UNIT/ML FlexTouch Pen, Inject 16 Units into the skin as needed. When blood sugar is over 150, Disp: , Rfl:    No Known Allergies  ROS Review of Systems  Constitutional: Positive for fatigue.  HENT: Negative.  Negative for mouth sores.   Eyes: Negative.  Negative for pain.  Respiratory: Negative for cough, choking, chest tightness, shortness of breath and wheezing.   Cardiovascular: Positive for leg swelling (rt leg). Negative for chest pain.  Gastrointestinal: Negative.  Negative for abdominal distention.  Genitourinary: Negative for enuresis and flank pain.  Musculoskeletal: Negative.  Negative for back pain.  Skin: Negative.   Allergic/Immunologic: Negative.   Neurological: Negative.  Negative for dizziness.  Hematological: Negative.   Psychiatric/Behavioral: Negative.   All other systems reviewed and are negative.     Objective:    Physical Exam Cardiovascular:     Rate and Rhythm: Tachycardia present. Rhythm irregular.     Heart sounds: No murmur heard.   Pulmonary:     Effort: Pulmonary effort is normal.     Breath sounds: Normal breath sounds.  Abdominal:     Palpations: There is no mass.  Musculoskeletal:        General: Swelling (rt leg  ) present.  Neurological:     Mental Status: He is alert and oriented to person, place, and time. Mental status is at baseline.  Psychiatric:        Mood and Affect: Mood normal.     BP (!) 146/73   Pulse (!) 114   Ht 5\' 9"  (1.753 m)   Wt 173 lb 12.8 oz (78.8 kg)   SpO2 95% Comment: room air  BMI 25.67 kg/m  Wt Readings from Last 3 Encounters:  01/19/21 173 lb 12.8 oz (78.8 kg)  01/06/21 184 lb 9.6 oz (83.7 kg)  12/21/20 171 lb 4.8 oz (77.7 kg)     Health Maintenance Due  Topic Date Due  . COVID-19 Vaccine (1) Never done  . FOOT EXAM  Never done  . OPHTHALMOLOGY EXAM  Never done  . URINE MICROALBUMIN  Never done  . TETANUS/TDAP  Never done  . PNA vac Low Risk Adult (2 of 2 - PCV13) 03/10/2012  .  INFLUENZA VACCINE  07/18/2020    There are no preventive care reminders to display for this patient.  Lab Results  Component Value Date   TSH 3.698 01/02/2021   Lab Results  Component Value Date   WBC 10.2 01/06/2021   HGB 9.9 (L) 01/06/2021   HCT 31.8 (L) 01/06/2021   MCV 98.5 01/06/2021   PLT 168 01/06/2021  Lab Results  Component Value Date   NA 137 01/06/2021   K 5.3 (H) 01/06/2021   CO2 25 01/06/2021   GLUCOSE 160 (H) 01/06/2021   BUN 44 (H) 01/06/2021   CREATININE 2.05 (H) 01/06/2021   BILITOT 0.9 01/02/2021   ALKPHOS 79 01/02/2021   AST 20 01/02/2021   ALT 18 01/02/2021   PROT 8.2 (H) 01/02/2021   ALBUMIN 4.1 01/02/2021   CALCIUM 8.1 (L) 01/06/2021   ANIONGAP 8 01/06/2021   No results found for: CHOL No results found for: HDL No results found for: LDLCALC No results found for: TRIG No results found for: CHOLHDL Lab Results  Component Value Date   HGBA1C 8.2 (H) 01/02/2021      Assessment & Plan:   Problem List Items Addressed This Visit      Cardiovascular and Mediastinum   Primary hypertension - Primary    - Today, the patient's blood pressure is well managed on present med. - The patient will continue the current treatment regimen.  - I encouraged the patient to eat a low-sodium diet to help control blood pressure. - I encouraged the patient to live an active lifestyle and complete activities that increases heart rate to 85% target heart rate at least 5 times per week for one hour.          CAD (coronary artery disease)    Coronary artery disease stable at the present time.  His heart is irregular chest decreased breath sound bilaterally  Congestive heart failure seems to be under control he has right leg swelling without any tenderness.      Atrial flutter ([place])    Patient is on amiodarone for atrial flutter fib.  She is still a little bit fast today blood pressure is under control.  Blood sugar is coming down.  He was advised to follow  low-salt diet        Respiratory   Hypoxia    Resolved.        Endocrine   Diabetes mellitus ([place])      - I encouraged the patient to regularly check blood sugar.  - I encouraged the patient to monitor diet. I encouraged the patient to eat low-carb and low-sugar to help prevent blood sugar spikes.  - I encouraged the patient to continue following their prescribed treatment plan for diabetes - I informed the patient to get help if blood sugar drops below 54mg /dL, or if suddenly have trouble thinking clearly or breathing.             No orders of the defined types were placed in this encounter.   Follow-up: No follow-ups on file.    [name], MD

## 2021-01-19 NOTE — Assessment & Plan Note (Signed)
-   Today, the patient's blood pressure is well managed on present med. - The patient will continue the current treatment regimen.  - I encouraged the patient to eat a low-sodium diet to help control blood pressure. - I encouraged the patient to live an active lifestyle and complete activities that increases heart rate to 85% target heart rate at least 5 times per week for one hour.     

## 2021-01-28 DIAGNOSIS — I2584 Coronary atherosclerosis due to calcified coronary lesion: Secondary | ICD-10-CM | POA: Diagnosis not present

## 2021-01-28 DIAGNOSIS — I2510 Atherosclerotic heart disease of native coronary artery without angina pectoris: Secondary | ICD-10-CM | POA: Diagnosis not present

## 2021-01-28 DIAGNOSIS — I447 Left bundle-branch block, unspecified: Secondary | ICD-10-CM | POA: Diagnosis not present

## 2021-01-28 DIAGNOSIS — I483 Typical atrial flutter: Secondary | ICD-10-CM | POA: Diagnosis not present

## 2021-01-28 DIAGNOSIS — R809 Proteinuria, unspecified: Secondary | ICD-10-CM | POA: Diagnosis not present

## 2021-01-28 DIAGNOSIS — E1129 Type 2 diabetes mellitus with other diabetic kidney complication: Secondary | ICD-10-CM | POA: Diagnosis not present

## 2021-01-28 DIAGNOSIS — E7800 Pure hypercholesterolemia, unspecified: Secondary | ICD-10-CM | POA: Diagnosis not present

## 2021-01-28 DIAGNOSIS — I10 Essential (primary) hypertension: Secondary | ICD-10-CM | POA: Diagnosis not present

## 2021-02-02 ENCOUNTER — Other Ambulatory Visit: Payer: Self-pay | Admitting: Internal Medicine

## 2021-02-06 DIAGNOSIS — J9601 Acute respiratory failure with hypoxia: Secondary | ICD-10-CM | POA: Diagnosis not present

## 2021-02-06 DIAGNOSIS — I4892 Unspecified atrial flutter: Secondary | ICD-10-CM | POA: Diagnosis not present

## 2021-02-06 DIAGNOSIS — M6281 Muscle weakness (generalized): Secondary | ICD-10-CM | POA: Diagnosis not present

## 2021-02-08 DIAGNOSIS — J9601 Acute respiratory failure with hypoxia: Secondary | ICD-10-CM | POA: Diagnosis not present

## 2021-02-08 DIAGNOSIS — I4892 Unspecified atrial flutter: Secondary | ICD-10-CM | POA: Diagnosis not present

## 2021-02-08 DIAGNOSIS — M6281 Muscle weakness (generalized): Secondary | ICD-10-CM | POA: Diagnosis not present

## 2021-02-15 ENCOUNTER — Encounter: Payer: Self-pay | Admitting: Internal Medicine

## 2021-02-15 ENCOUNTER — Ambulatory Visit (INDEPENDENT_AMBULATORY_CARE_PROVIDER_SITE_OTHER): Payer: Medicare HMO | Admitting: Internal Medicine

## 2021-02-15 ENCOUNTER — Other Ambulatory Visit: Payer: Self-pay

## 2021-02-15 VITALS — BP 139/71 | HR 64 | Ht 69.0 in | Wt 164.2 lb

## 2021-02-15 DIAGNOSIS — C44311 Basal cell carcinoma of skin of nose: Secondary | ICD-10-CM | POA: Diagnosis not present

## 2021-02-15 DIAGNOSIS — Z794 Long term (current) use of insulin: Secondary | ICD-10-CM | POA: Diagnosis not present

## 2021-02-15 DIAGNOSIS — E1159 Type 2 diabetes mellitus with other circulatory complications: Secondary | ICD-10-CM | POA: Diagnosis not present

## 2021-02-15 DIAGNOSIS — I10 Essential (primary) hypertension: Secondary | ICD-10-CM

## 2021-02-15 DIAGNOSIS — J40 Bronchitis, not specified as acute or chronic: Secondary | ICD-10-CM

## 2021-02-15 DIAGNOSIS — E119 Type 2 diabetes mellitus without complications: Secondary | ICD-10-CM | POA: Diagnosis not present

## 2021-02-15 HISTORY — DX: Basal cell carcinoma of skin of nose: C44.311

## 2021-02-15 LAB — GLUCOSE, POCT (MANUAL RESULT ENTRY): POC Glucose: 141 mg/dl — AB (ref 70–99)

## 2021-02-15 MED ORDER — LEVOFLOXACIN 500 MG PO TABS: 500.0000 mg | ORAL_TABLET | Freq: Every day | ORAL | 0 refills | Status: DC

## 2021-02-15 MED ORDER — ALBUTEROL SULFATE (2.5 MG/3ML) 0.083% IN NEBU: 2.5000 mg | INHALATION_SOLUTION | Freq: Four times a day (QID) | RESPIRATORY_TRACT | 1 refills | Status: DC | PRN

## 2021-02-15 NOTE — Assessment & Plan Note (Signed)
Patient has rodent ulcer on the nose, will be referred to skin specialist for evaluation

## 2021-02-15 NOTE — Assessment & Plan Note (Signed)
-   I encouraged the patient to regularly check blood sugar.  - I encouraged the patient to monitor diet. I encouraged the patient to eat low-carb and low-sugar to help prevent blood sugar spikes.  - I encouraged the patient to continue following their prescribed treatment plan for diabetes - I informed the patient to get help if blood sugar drops below 54mg/dL, or if suddenly have trouble thinking clearly or breathing.     

## 2021-02-15 NOTE — Assessment & Plan Note (Signed)
Patient blood pressure is normal patient denies any chest pain or shortness of breath there is no history of palpitation paroxysmal nocturnal dyspnea patient can walk 100 feet without any problem patient was advised to follow low-salt low-cholesterol diet

## 2021-02-15 NOTE — Progress Notes (Signed)
Established Patient Office Visit  Subjective:  Patient ID: Michael Simon, male    DOB: 1933-09-21  Age: 87 y.o. MRN: [id number]  CC:  Chief Complaint  Patient presents with  . Diabetes  . Hypertension    HPI  Michael Simon presents for bronchitis chest cold persistent cough with expectoration wheezing he denies any chest pain or swelling of the legs.  Patient medicines were reviewed.He is known to have hypertension coronary artery disease atrial arrhythmia diverticulitis and diabetes. Past Medical History:  Diagnosis Date  . Basal cell carcinoma (BCC) of skin of nose 02/15/2021  . CAD (coronary artery disease)   . HLD (hyperlipidemia)   . HTN (hypertension)   . LBBB (left bundle branch block)   . Myocardial infarction ([place])   . Osteoarthritis   . Type II diabetes mellitus ([place])     Past Surgical History:  Procedure Laterality Date  . CARDIAC CATHETERIZATION    . CATARACT EXTRACTION    . COLONOSCOPY    . COLONOSCOPY WITH PROPOFOL N/A 08/26/2019   Procedure: COLONOSCOPY WITH PROPOFOL;  Surgeon: [name], MD;  Location: [facility] ENDOSCOPY;  Service: Gastroenterology;  Laterality: N/A;  . COLONOSCOPY WITH PROPOFOL N/A 09/22/2019   Procedure: COLONOSCOPY WITH PROPOFOL;  Surgeon: [name], MD;  Location: [facility] ENDOSCOPY;  Service: Gastroenterology;  Laterality: N/A;  pediatric colonoscope needed  . CORONARY ARTERY BYPASS GRAFT  2012  . EYE SURGERY    . INGUINAL HERNIA REPAIR    . JOINT REPLACEMENT     knees    Family History  Problem Relation Age of Onset  . Arthritis Mother   . Heart disease Mother   . Heart disease Other     Social History   Socioeconomic History  . Marital status: Married    Spouse name: Not on file  . Number of children: Not on file  . Years of education: Not on file  . Highest education level: Not on file  Occupational History  . Not on file  Tobacco Use  . Smoking status: Former Smoker    Quit date: 03/03/2008    Years since  quitting: 12.9  . Smokeless tobacco: Never Used  Vaping Use  . Vaping Use: Never used  Substance and Sexual Activity  . Alcohol use: No    Alcohol/week: 0.0 standard drinks  . Drug use: No  . Sexual activity: Not on file  Other Topics Concern  . Not on file  Social History Narrative  . Not on file   Social Determinants of Health   Financial Resource Strain: Not on file  Food Insecurity: Not on file  Transportation Needs: Not on file  Physical Activity: Not on file  Stress: Not on file  Social Connections: Not on file  Intimate Partner Violence: Not on file     Current Outpatient Medications:  .  albuterol (PROVENTIL) (2.5 MG/3ML) 0.083% nebulizer solution, Take 3 mLs (2.5 mg total) by nebulization every 6 (six) hours as needed for wheezing or shortness of breath., Disp: 150 mL, Rfl: 1 .  albuterol (VENTOLIN HFA) 108 (90 Base) MCG/ACT inhaler, TAKE 2 PUFFS BY MOUTH EVERY 6 HOURS AS NEEDED FOR WHEEZE OR SHORTNESS OF BREATH, Disp: 8.5 each, Rfl: 6 .  amiodarone (PACERONE) 200 MG tablet, Take 1 tablet (200 mg total) by mouth 2 (two) times daily., Disp: 60 tablet, Rfl: 1 .  apixaban (ELIQUIS) 2.5 MG TABS tablet, Take 1 tablet (2.5 mg total) by mouth 2 (two) times daily., Disp: 60 tablet,  Rfl: 2 .  budesonide-formoterol (SYMBICORT) 80-4.5 MCG/ACT inhaler, Inhale 2 puffs into the lungs 2 (two) times daily., Disp: 1 each, Rfl: 2 .  Cyanocobalamin 1000 MCG TBCR, Take 1,000 mcg by mouth daily. , Disp: , Rfl:  .  fluticasone (FLONASE) 50 MCG/ACT nasal spray, Place 2 sprays into both nostrils daily., Disp: 9.9 mL, Rfl: 0 .  furosemide (LASIX) 20 MG tablet, Take 1 tablet (20 mg total) by mouth daily., Disp: 30 tablet, Rfl: 3 .  Insulin Pen Needle (PEN NEEDLES) 33G X 4 MM MISC, Use to administer insulin daily, Disp: 100 each, Rfl: 6 .  levofloxacin (LEVAQUIN) 500 MG tablet, Take 1 tablet (500 mg total) by mouth daily., Disp: 7 tablet, Rfl: 0 .  loratadine (CLARITIN) 10 MG tablet, Take 1 tablet  (10 mg total) by mouth daily., Disp: 30 tablet, Rfl: 1 .  lovastatin (MEVACOR) 40 MG tablet, Take 1 tablet (40 mg total) by mouth at bedtime., Disp: 30 tablet, Rfl: 6 .  pantoprazole (PROTONIX) 40 MG tablet, TAKE 1 TABLET BY MOUTH EVERY DAY, Disp: 90 tablet, Rfl: 3 .  tiotropium (SPIRIVA HANDIHALER) 18 MCG inhalation capsule, Place 1 capsule (18 mcg total) into inhaler and inhale daily., Disp: 30 capsule, Rfl: 2 .  TRESIBA FLEXTOUCH 100 UNIT/ML FlexTouch Pen, Inject 16 Units into the skin as needed. When blood sugar is over 150, Disp: , Rfl:    No Known Allergies  ROS Review of Systems  Constitutional: Positive for fatigue. Negative for chills, diaphoresis and fever.  HENT: Positive for postnasal drip, rhinorrhea and sore throat.   Eyes: Negative.  Negative for discharge.  Respiratory: Positive for cough and shortness of breath.   Cardiovascular: Negative.   Gastrointestinal: Negative.   Endocrine: Negative.   Genitourinary: Negative.   Musculoskeletal: Negative.   Skin: Negative.   Allergic/Immunologic: Negative.   Neurological: Negative.   Hematological: Negative.   Psychiatric/Behavioral: Negative.   All other systems reviewed and are negative.     Objective:    Physical Exam Vitals reviewed.  Constitutional:      Appearance: Normal appearance.  HENT:     Mouth/Throat:     Mouth: Mucous membranes are moist.  Eyes:     Pupils: Pupils are equal, round, and reactive to light.  Neck:     Vascular: No carotid bruit.  Cardiovascular:     Rate and Rhythm: Normal rate and regular rhythm.     Pulses: Normal pulses.     Heart sounds: Normal heart sounds.  Pulmonary:     Effort: Pulmonary effort is normal.     Breath sounds: Wheezing and rhonchi present.  Abdominal:     General: Bowel sounds are normal.     Palpations: Abdomen is soft. There is no hepatomegaly, splenomegaly or mass.     Tenderness: There is no abdominal tenderness.     Hernia: No hernia is present.   Musculoskeletal:     Cervical back: Neck supple.     Right lower leg: No edema.     Left lower leg: No edema.  Skin:    Findings: No rash.  Neurological:     Mental Status: He is alert and oriented to person, place, and time.     Motor: No weakness.  Psychiatric:        Mood and Affect: Mood normal.        Behavior: Behavior normal.     BP 139/71   Pulse 64   Ht 5\' 9"  (1.753 m)  Wt 164 lb 3.2 oz (74.5 kg)   BMI 24.25 kg/m  Wt Readings from Last 3 Encounters:  02/15/21 164 lb 3.2 oz (74.5 kg)  01/19/21 173 lb 12.8 oz (78.8 kg)  01/06/21 184 lb 9.6 oz (83.7 kg)     Health Maintenance Due  Topic Date Due  . FOOT EXAM  Never done  . OPHTHALMOLOGY EXAM  Never done  . URINE MICROALBUMIN  Never done  . COVID-19 Vaccine (1) Never done  . TETANUS/TDAP  Never done  . PNA vac Low Risk Adult (2 of 2 - PCV13) 03/10/2012  . INFLUENZA VACCINE  07/18/2020    There are no preventive care reminders to display for this patient.  Lab Results  Component Value Date   TSH 3.698 01/02/2021   Lab Results  Component Value Date   WBC 10.2 01/06/2021   HGB 9.9 (L) 01/06/2021   HCT 31.8 (L) 01/06/2021   MCV 98.5 01/06/2021   PLT 168 01/06/2021   Lab Results  Component Value Date   NA 137 01/06/2021   K 5.3 (H) 01/06/2021   CO2 25 01/06/2021   GLUCOSE 160 (H) 01/06/2021   BUN 44 (H) 01/06/2021   CREATININE 2.05 (H) 01/06/2021   BILITOT 0.9 01/02/2021   ALKPHOS 79 01/02/2021   AST 20 01/02/2021   ALT 18 01/02/2021   PROT 8.2 (H) 01/02/2021   ALBUMIN 4.1 01/02/2021   CALCIUM 8.1 (L) 01/06/2021   ANIONGAP 8 01/06/2021   No results found for: CHOL No results found for: HDL No results found for: LDLCALC No results found for: TRIG No results found for: CHOLHDL Lab Results  Component Value Date   HGBA1C 8.2 (H) 01/02/2021      Assessment & Plan:   Problem List Items Addressed This Visit      Cardiovascular and Mediastinum   Essential hypertension    Patient blood  pressure is normal patient denies any chest pain or shortness of breath there is no history of palpitation paroxysmal nocturnal dyspnea patient can walk 100 feet without any problem patient was advised to follow low-salt low-cholesterol diet        Respiratory   Bronchitis    Patient will be started on inhaled nebulizer treatment with albuterol he was also started on Levaquin.  I will see him back in 2 weeks.      Relevant Medications   levofloxacin (LEVAQUIN) 500 MG tablet   albuterol (PROVENTIL) (2.5 MG/3ML) 0.083% nebulizer solution     Endocrine   Diabetes mellitus ([place]) - Primary     .  - I encouraged the patient to regularly check blood sugar.  - I encouraged the patient to monitor diet. I encouraged the patient to eat low-carb and low-sugar to help prevent blood sugar spikes.  - I encouraged the patient to continue following their prescribed treatment plan for diabetes - I informed the patient to get help if blood sugar drops below 54mg /dL, or if suddenly have trouble thinking clearly or breathing.          Relevant Orders   POCT glucose (manual entry) (Completed)     Musculoskeletal and Integument   Basal cell carcinoma (BCC) of skin of nose    Patient has rodent ulcer on the nose, will be referred to skin specialist for evaluation      Relevant Medications   levofloxacin (LEVAQUIN) 500 MG tablet      Meds ordered this encounter  Medications  . levofloxacin (LEVAQUIN) 500 MG tablet  Sig: Take 1 tablet (500 mg total) by mouth daily.    Dispense:  7 tablet    Refill:  0  . albuterol (PROVENTIL) (2.5 MG/3ML) 0.083% nebulizer solution    Sig: Take 3 mLs (2.5 mg total) by nebulization every 6 (six) hours as needed for wheezing or shortness of breath.    Dispense:  150 mL    Refill:  1    Follow-up: No follow-ups on file.    [name], MD

## 2021-02-15 NOTE — Assessment & Plan Note (Signed)
Patient will be started on inhaled nebulizer treatment with albuterol he was also started on Levaquin.  I will see him back in 2 weeks.

## 2021-02-16 ENCOUNTER — Other Ambulatory Visit: Payer: Self-pay | Admitting: Internal Medicine

## 2021-02-17 NOTE — Addendum Note (Signed)
Addended by: Alois Cliche on: 02/17/2021 10:22 AM   Modules accepted: Orders

## 2021-02-18 ENCOUNTER — Other Ambulatory Visit: Payer: Self-pay

## 2021-02-18 DIAGNOSIS — J40 Bronchitis, not specified as acute or chronic: Secondary | ICD-10-CM

## 2021-02-18 MED ORDER — LEVOFLOXACIN 500 MG PO TABS: 500.0000 mg | ORAL_TABLET | Freq: Every day | ORAL | 0 refills | Status: DC

## 2021-02-19 ENCOUNTER — Other Ambulatory Visit: Payer: Self-pay | Admitting: Internal Medicine

## 2021-03-01 ENCOUNTER — Other Ambulatory Visit: Payer: Self-pay | Admitting: Internal Medicine

## 2021-03-01 DIAGNOSIS — R0600 Dyspnea, unspecified: Secondary | ICD-10-CM

## 2021-03-06 DIAGNOSIS — J9601 Acute respiratory failure with hypoxia: Secondary | ICD-10-CM | POA: Diagnosis not present

## 2021-03-06 DIAGNOSIS — M6281 Muscle weakness (generalized): Secondary | ICD-10-CM | POA: Diagnosis not present

## 2021-03-06 DIAGNOSIS — I4892 Unspecified atrial flutter: Secondary | ICD-10-CM | POA: Diagnosis not present

## 2021-03-08 DIAGNOSIS — E7800 Pure hypercholesterolemia, unspecified: Secondary | ICD-10-CM | POA: Diagnosis not present

## 2021-03-08 DIAGNOSIS — J9601 Acute respiratory failure with hypoxia: Secondary | ICD-10-CM | POA: Diagnosis not present

## 2021-03-08 DIAGNOSIS — I4892 Unspecified atrial flutter: Secondary | ICD-10-CM | POA: Diagnosis not present

## 2021-03-08 DIAGNOSIS — M6281 Muscle weakness (generalized): Secondary | ICD-10-CM | POA: Diagnosis not present

## 2021-03-08 DIAGNOSIS — I447 Left bundle-branch block, unspecified: Secondary | ICD-10-CM | POA: Diagnosis not present

## 2021-03-08 DIAGNOSIS — I483 Typical atrial flutter: Secondary | ICD-10-CM | POA: Diagnosis not present

## 2021-03-08 DIAGNOSIS — E119 Type 2 diabetes mellitus without complications: Secondary | ICD-10-CM | POA: Diagnosis not present

## 2021-03-08 DIAGNOSIS — R002 Palpitations: Secondary | ICD-10-CM | POA: Diagnosis not present

## 2021-03-08 DIAGNOSIS — I10 Essential (primary) hypertension: Secondary | ICD-10-CM | POA: Diagnosis not present

## 2021-03-22 ENCOUNTER — Ambulatory Visit: Payer: Medicare HMO | Admitting: Internal Medicine

## 2021-03-24 ENCOUNTER — Other Ambulatory Visit: Payer: Self-pay | Admitting: Internal Medicine

## 2021-03-24 DIAGNOSIS — J40 Bronchitis, not specified as acute or chronic: Secondary | ICD-10-CM

## 2021-04-14 ENCOUNTER — Other Ambulatory Visit: Payer: Self-pay | Admitting: Dermatology

## 2021-04-14 ENCOUNTER — Other Ambulatory Visit: Payer: Self-pay

## 2021-04-14 ENCOUNTER — Ambulatory Visit: Payer: Medicare HMO | Admitting: Dermatology

## 2021-04-14 DIAGNOSIS — D492 Neoplasm of unspecified behavior of bone, soft tissue, and skin: Secondary | ICD-10-CM

## 2021-04-14 DIAGNOSIS — E119 Type 2 diabetes mellitus without complications: Secondary | ICD-10-CM | POA: Diagnosis not present

## 2021-04-14 DIAGNOSIS — C4491 Basal cell carcinoma of skin, unspecified: Secondary | ICD-10-CM

## 2021-04-14 DIAGNOSIS — C44519 Basal cell carcinoma of skin of other part of trunk: Secondary | ICD-10-CM

## 2021-04-14 DIAGNOSIS — C44619 Basal cell carcinoma of skin of left upper limb, including shoulder: Secondary | ICD-10-CM | POA: Diagnosis not present

## 2021-04-14 DIAGNOSIS — D692 Other nonthrombocytopenic purpura: Secondary | ICD-10-CM | POA: Diagnosis not present

## 2021-04-14 DIAGNOSIS — C44311 Basal cell carcinoma of skin of nose: Secondary | ICD-10-CM

## 2021-04-14 DIAGNOSIS — L578 Other skin changes due to chronic exposure to nonionizing radiation: Secondary | ICD-10-CM | POA: Diagnosis not present

## 2021-04-14 HISTORY — DX: Basal cell carcinoma of skin, unspecified: C44.91

## 2021-04-14 NOTE — Progress Notes (Signed)
New Patient Visit  Subjective  Michael Simon is a 87 y.o. male who presents for the following: Skin Problem (Check a spot on the nose x 6-8 months, hx of bleeding, treating with Neosporin ). PT c/o tender irritated growth on his chest for several years.   Daughter with patient   The following portions of the chart were reviewed this encounter and updated as appropriate:   Tobacco  Allergies  Meds  Problems  Med Hx  Surg Hx  Fam Hx      Review of Systems:  No other skin or systemic complaints except as noted in HPI or Assessment and Plan.  Objective  Well appearing patient in no apparent distress; mood and affect are within normal limits.  A focused examination was performed including face, neck . Relevant physical exam findings are noted in the Assessment and Plan.  Objective  Nose tip: ~1.2 cm crusted plaque      Objective  Left medial clavicle: ~4.5 x 1.0 cm Linear crusted plaque         Assessment & Plan  Neoplasm of skin (2) Nose tip  Skin / nail biopsy Type of biopsy: tangential   Informed consent: discussed and consent obtained   Timeout: patient name, date of birth, surgical site, and procedure verified   Procedure prep:  Patient was prepped and draped in usual sterile fashion Prep type:  Isopropyl alcohol Anesthesia: the lesion was anesthetized in a standard fashion   Anesthetic:  1% lidocaine w/ epinephrine 1-100,000 buffered w/ 8.4% NaHCO3 Instrument used: flexible razor blade   Hemostasis achieved with: aluminum chloride   Outcome: patient tolerated procedure well   Post-procedure details: wound care instructions given   Additional details:  Petrolatum and a pressure bandage applied  Specimen 1 - Surgical pathology Differential Diagnosis: R/O BCC  Check Margins: No crusted plaque  Left medial clavicle  Skin / nail biopsy Type of biopsy: tangential   Informed consent: discussed and consent obtained   Timeout: patient name,  date of birth, surgical site, and procedure verified   Procedure prep:  Patient was prepped and draped in usual sterile fashion Prep type:  Isopropyl alcohol Anesthesia: the lesion was anesthetized in a standard fashion   Anesthetic:  1% lidocaine w/ epinephrine 1-100,000 buffered w/ 8.4% NaHCO3 Instrument used: flexible razor blade   Hemostasis achieved with: aluminum chloride   Outcome: patient tolerated procedure well   Post-procedure details: wound care instructions given   Additional details:  Petrolatum and a pressure bandage applied  Specimen 2 - Surgical pathology Differential Diagnosis: R/O BCC  Check Margins: No Linear crusted plaque  Purpura - Chronic; persistent and recurrent.  Treatable, but not curable. Hands, arms - Violaceous macules and patches - Benign - Related to trauma, age, sun damage and/or use of blood thinners, chronic use of topical and/or oral steroids - Observe - Can use OTC arnica containing moisturizer such as Dermend Bruise Formula if desired - Call for worsening or other concerns  Actinic Damage - chronic, secondary to cumulative UV radiation exposure/sun exposure over time - diffuse scaly erythematous macules with underlying dyspigmentation - Recommend daily broad spectrum sunscreen SPF 30+ to sun-exposed areas, reapply every 2 hours as needed.  - Recommend staying in the shade or wearing long sleeves, sun glasses (UVA+UVB protection) and wide brim hats (4-inch brim around the entire circumference of the hat). - Call for new or changing lesions.  Return if symptoms worsen or fail to improve.  I, Michael Simon,  CMA, am acting as scribe for Sarina Ser, MD .  Documentation: I have reviewed the above documentation for accuracy and completeness, and I agree with the above.  Sarina Ser, MD

## 2021-04-14 NOTE — Patient Instructions (Addendum)

## 2021-04-18 ENCOUNTER — Telehealth: Payer: Self-pay

## 2021-04-18 ENCOUNTER — Encounter: Payer: Self-pay | Admitting: Dermatology

## 2021-04-18 NOTE — Telephone Encounter (Signed)
-----   Message from [name], MD sent at 04/18/2021  8:59 AM EDT ----- Diagnosis 1. Skin , nose tip BASAL CELL CARCINOMA, NODULAR PATTERN 2. Skin , left medial clavicle SUPERFICIAL AND NODULAR BASAL CELL CARCINOMA  1- Cancer - BCC Schedule appt to discuss treatment options (Radiation vs MOHS) 2- Cancer - BCC Schedule surgery (can discuss #1 at time of surgery if pt would like to do together)

## 2021-04-18 NOTE — Telephone Encounter (Signed)
Discussed biopsy results with pt and his sister Juliann Pulse, pt made an appt to come in the office May 5 at 10:45 to discuss biopsy

## 2021-04-21 ENCOUNTER — Ambulatory Visit: Payer: Medicare HMO | Admitting: Dermatology

## 2021-04-21 ENCOUNTER — Other Ambulatory Visit: Payer: Self-pay

## 2021-04-21 DIAGNOSIS — C44619 Basal cell carcinoma of skin of left upper limb, including shoulder: Secondary | ICD-10-CM

## 2021-04-21 DIAGNOSIS — C44519 Basal cell carcinoma of skin of other part of trunk: Secondary | ICD-10-CM

## 2021-04-21 DIAGNOSIS — C44311 Basal cell carcinoma of skin of nose: Secondary | ICD-10-CM | POA: Diagnosis not present

## 2021-04-21 NOTE — Progress Notes (Signed)
   Follow-Up Visit   Subjective  Michael Simon is a 87 y.o. male who presents for the following: Follow-up (Pt here to discuss biopsy results ).  Daughter  with patient contributing to history   The following portions of the chart were reviewed this encounter and updated as appropriate:   Tobacco  Allergies  Meds  Problems  Med Hx  Surg Hx  Fam Hx     Review of Systems:  No other skin or systemic complaints except as noted in HPI or Assessment and Plan.  Objective  Well appearing patient in no apparent distress; mood and affect are within normal limits.  A focused examination was performed including face,chest . Relevant physical exam findings are noted in the Assessment and Plan.  Objective  Nose tip: Pink pearly papule or plaque with arborizing vessels.   Objective  left medial clavicle: Pink pearly papule or plaque with arborizing vessels.    Assessment & Plan  Basal cell carcinoma (BCC) of skin of nose Nose tip Biopsy proven BCC  Discussed treatment option Mohs vs radiation treatment at the cancer center. The patient and daughter opted for radiation therapy.  Will refer to Dr. Baruch Gouty at [facility] cancer center.  Other Related Procedures Ambulatory referral to Radiation Oncology  Basal cell carcinoma (BCC) of skin of left upper extremity including shoulder left medial clavicle Biopsy proven BCC  Discussed treatment plan we recommend surgery excision here in the office   Return for surgery BCC on the Left clavicle .  IMarye Round, CMA, am acting as scribe for Sarina Ser, MD .  Documentation: I have reviewed the above documentation for accuracy and completeness, and I agree with the above.  Sarina Ser, MD

## 2021-04-21 NOTE — Patient Instructions (Addendum)
If you have any questions or concerns for your doctor, please call our main line at [phone] and press option 4 to reach your doctor's medical assistant. If no one answers, please leave a voicemail as directed and we will return your call as soon as possible. Messages left after 4 pm will be answered the following business day.   You may also send us a message via MyChart. We typically respond to MyChart messages within 1-2 business days.  For prescription refills, please ask your pharmacy to contact our office. Our fax number is [phone].  If you have an urgent issue when the clinic is closed that cannot wait until the next business day, you can page your doctor at the number below.    Please note that while we do our best to be available for urgent issues outside of office hours, we are not available 24/7.   If you have an urgent issue and are unable to reach us, you may choose to seek medical care at your doctor's office, retail clinic, urgent care center, or emergency room.  If you have a medical emergency, please immediately call 911 or go to the emergency department.  Pager Numbers  - Dr. Kowalski: [phone]  - Dr. Moye: [phone]  - Dr. Stewart: [phone]  In the event of inclement weather, please call our main line at [phone] for an update on the status of any delays or closures.  Dermatology Medication Tips: Please keep the boxes that topical medications come in in order to help keep track of the instructions about where and how to use these. Pharmacies typically print the medication instructions only on the boxes and not directly on the medication tubes.   If your medication is too expensive, please contact our office at [phone] option 4 or send us a message through MyChart.   We are unable to tell what your co-pay for medications will be in advance as this is different depending on your insurance coverage. However, we may be able to find a substitute  medication at lower cost or fill out paperwork to get insurance to cover a needed medication.   If a prior authorization is required to get your medication covered by your insurance company, please allow us 1-2 business days to complete this process.  Drug prices often vary depending on where the prescription is filled and some pharmacies may offer cheaper prices.  The website www.goodrx.com contains coupons for medications through different pharmacies. The prices here do not account for what the cost may be with help from insurance (it may be cheaper with your insurance), but the website can give you the price if you did not use any insurance.  - You can print the associated coupon and take it with your prescription to the pharmacy.  - You may also stop by our office during regular business hours and pick up a GoodRx coupon card.  - If you need your prescription sent electronically to a different pharmacy, notify our office through [facility] MyChart or by phone at [phone] option 4.     Pre-Operative Instructions  You are scheduled for a surgical procedure at [facility]. We recommend you read the following instructions. If you have any questions or concerns, please call the office at [phone].  1. Shower and wash the entire body with soap and water the day of your surgery paying special attention to cleansing at and around the planned surgery site.  2. Avoid aspirin or aspirin containing products at   least fourteen (14) days prior to your surgical procedure and for at least one week (7 Days) after your surgical procedure. If you take aspirin on a regular basis for heart disease or history of stroke or for any other reason, we may recommend you continue taking aspirin but please notify us if you take this on a regular basis. Aspirin can cause more bleeding to occur during surgery as well as prolonged bleeding and bruising after surgery.   3. Avoid other nonsteroidal pain  medications at least one week prior to surgery and at least one week prior to your surgery. These include medications such as Ibuprofen (Motrin, Advil and Nuprin), Naprosyn, Voltaren, Relafen, etc. If medications are used for therapeutic reasons, please inform us as they can cause increased bleeding or prolonged bleeding during and bruising after surgical procedures.   4. Please advise us if you are taking any "blood thinner" medications such as Coumadin or Dipyridamole or Plavix or similar medications. These cause increased bleeding and prolonged bleeding during procedures and bruising after surgical procedures. We may have to consider discontinuing these medications briefly prior to and shortly after your surgery if safe to do so.   5. Please inform us of all medications you are currently taking. All medications that are taken regularly should be taken the day of surgery as you always do. Nevertheless, we need to be informed of what medications you are taking prior to surgery to know whether they will affect the procedure or cause any complications.   6. Please inform us of any medication allergies. Also inform us of whether you have allergies to Latex or rubber products or whether you have had any adverse reaction to Lidocaine or Epinephrine.  7. Please inform us of any prosthetic or artificial body parts such as artificial heart valve, joint replacements, etc., or similar condition that might require preoperative antibiotics.   8. We recommend avoidance of alcohol at least two weeks prior to surgery and continued avoidance for at least two weeks after surgery.   9. We recommend discontinuation of tobacco smoking at least two weeks prior to surgery and continued abstinence for at least two weeks after surgery.  10. Do not plan strenuous exercise, strenuous work or strenuous lifting for approximately four weeks after your surgery.   11. We request if you are unable to make your scheduled surgical  appointment, please call us at least a week in advance or as soon as you are aware of a problem so that we can cancel or reschedule the appointment.   12. You MAY TAKE TYLENOL (acetaminophen) for pain as it is not a blood thinner.   13. PLEASE PLAN TO BE IN TOWN FOR TWO WEEKS FOLLOWING SURGERY, THIS IS IMPORTANT SO YOU CAN BE CHECKED FOR DRESSING CHANGES, SUTURE REMOVAL AND TO MONITOR FOR POSSIBLE COMPLICATIONS.  14.  

## 2021-04-25 ENCOUNTER — Encounter: Payer: Self-pay | Admitting: Dermatology

## 2021-04-26 ENCOUNTER — Ambulatory Visit (INDEPENDENT_AMBULATORY_CARE_PROVIDER_SITE_OTHER): Payer: Medicare HMO | Admitting: Internal Medicine

## 2021-04-26 ENCOUNTER — Other Ambulatory Visit: Payer: Self-pay

## 2021-04-26 ENCOUNTER — Encounter: Payer: Self-pay | Admitting: Internal Medicine

## 2021-04-26 VITALS — BP 132/84 | HR 86 | Ht 69.0 in | Wt 169.3 lb

## 2021-04-26 DIAGNOSIS — Z794 Long term (current) use of insulin: Secondary | ICD-10-CM | POA: Diagnosis not present

## 2021-04-26 DIAGNOSIS — I10 Essential (primary) hypertension: Secondary | ICD-10-CM

## 2021-04-26 DIAGNOSIS — I2510 Atherosclerotic heart disease of native coronary artery without angina pectoris: Secondary | ICD-10-CM

## 2021-04-26 DIAGNOSIS — E1159 Type 2 diabetes mellitus with other circulatory complications: Secondary | ICD-10-CM

## 2021-04-26 DIAGNOSIS — R0902 Hypoxemia: Secondary | ICD-10-CM | POA: Diagnosis not present

## 2021-04-28 ENCOUNTER — Encounter: Payer: Self-pay | Admitting: Internal Medicine

## 2021-04-28 ENCOUNTER — Encounter: Payer: Self-pay | Admitting: Radiation Oncology

## 2021-04-28 ENCOUNTER — Ambulatory Visit
Admission: RE | Admit: 2021-04-28 | Discharge: 2021-04-28 | Disposition: A | Discharge status: Home / Self Care | Payer: Medicare HMO | Source: Ambulatory Visit | Attending: Radiation Oncology | Admitting: Radiation Oncology

## 2021-04-28 VITALS — BP 156/79 | HR 68 | Temp 97.2°F | Wt 168.5 lb

## 2021-04-28 DIAGNOSIS — C44311 Basal cell carcinoma of skin of nose: Secondary | ICD-10-CM

## 2021-04-28 NOTE — Consult Note (Signed)
NEW PATIENT EVALUATION  Name: Michael Simon  MRN: [id number]  Date:   04/28/2021     DOB: [birth year]   This 87 y.o. male patient presents to the clinic for initial evaluation of a basal cell carcinoma nodular pattern of the tip of the nose.  REFERRING PHYSICIAN: [name], MD  CHIEF COMPLAINT:  Chief Complaint  Patient presents with  . Consult    DIAGNOSIS: The encounter diagnosis was Basal cell carcinoma (BCC) of skin of nose.   PREVIOUS INVESTIGATIONS:  Pathology port reviewed Clinical notes reviewed  HPI: Patient is an 87 year old male who presented with a lesion of his tip of his nose.  Status was biopsy positive for nodular type basal cell carcinoma.  He also has numerous other skin lesions being followed and treated by Dr. Nehemiah Massed.  He is asymptomatic.  He does have multiple medical problems including left bundle branch block myocardial infarction and type 2 diabetes mellitus.  Appetite is good.  PLANNED TREATMENT REGIMEN: Electron-beam therapy  PAST MEDICAL HISTORY:  has a past medical history of Basal cell carcinoma (04/14/2021), Basal cell carcinoma (04/14/2021), Basal cell carcinoma (BCC) of skin of nose (02/15/2021), CAD (coronary artery disease), HLD (hyperlipidemia), HTN (hypertension), LBBB (left bundle branch block), Myocardial infarction ([place]), Osteoarthritis, and Type II diabetes mellitus ([place]).    PAST SURGICAL HISTORY:  Past Surgical History:  Procedure Laterality Date  . CARDIAC CATHETERIZATION    . CATARACT EXTRACTION    . COLONOSCOPY    . COLONOSCOPY WITH PROPOFOL N/A 08/26/2019   Procedure: COLONOSCOPY WITH PROPOFOL;  Surgeon: [name], MD;  Location: [facility] ENDOSCOPY;  Service: Gastroenterology;  Laterality: N/A;  . COLONOSCOPY WITH PROPOFOL N/A 09/22/2019   Procedure: COLONOSCOPY WITH PROPOFOL;  Surgeon: [name], MD;  Location: [facility] ENDOSCOPY;  Service: Gastroenterology;  Laterality: N/A;  pediatric colonoscope needed  . CORONARY ARTERY  BYPASS GRAFT  2012  . EYE SURGERY    . INGUINAL HERNIA REPAIR    . JOINT REPLACEMENT     knees    FAMILY HISTORY: family history includes Arthritis in his mother; Heart disease in his mother and another family member.  SOCIAL HISTORY:  reports that he quit smoking about 13 years ago. He has never used smokeless tobacco. He reports that he does not drink alcohol and does not use drugs.  ALLERGIES: Patient has no known allergies.  MEDICATIONS:  Current Outpatient Medications  Medication Sig Dispense Refill  . albuterol (PROVENTIL) (2.5 MG/3ML) 0.083% nebulizer solution Take 3 mLs (2.5 mg total) by nebulization every 6 (six) hours as needed for wheezing or shortness of breath. 150 mL 1  . albuterol (VENTOLIN HFA) 108 (90 Base) MCG/ACT inhaler TAKE 2 PUFFS BY MOUTH EVERY 6 HOURS AS NEEDED FOR WHEEZE OR SHORTNESS OF BREATH 8.5 each 6  . amiodarone (PACERONE) 200 MG tablet Take 1 tablet (200 mg total) by mouth 2 (two) times daily. 60 tablet 1  . apixaban (ELIQUIS) 2.5 MG TABS tablet Take 1 tablet (2.5 mg total) by mouth 2 (two) times daily. 60 tablet 2  . budesonide-formoterol (SYMBICORT) 80-4.5 MCG/ACT inhaler Inhale 2 puffs into the lungs 2 (two) times daily. 1 each 2  . Cyanocobalamin 1000 MCG TBCR Take 1,000 mcg by mouth daily.     . fluticasone (FLONASE) 50 MCG/ACT nasal spray Place 2 sprays into both nostrils daily. 9.9 mL 0  . furosemide (LASIX) 20 MG tablet TAKE 1 TABLET BY MOUTH EVERY DAY 90 tablet 1  . Insulin Pen Needle (PEN NEEDLES) 33G  X 4 MM MISC Use to administer insulin daily 100 each 6  . levofloxacin (LEVAQUIN) 500 MG tablet TAKE 1 TABLET (500 MG TOTAL) BY MOUTH DAILY. 7 tablet 0  . loratadine (CLARITIN) 10 MG tablet Take 1 tablet (10 mg total) by mouth daily. 30 tablet 1  . lovastatin (MEVACOR) 40 MG tablet TAKE 1 TABLET BY MOUTH EVERYDAY AT BEDTIME 90 tablet 2  . pantoprazole (PROTONIX) 40 MG tablet TAKE 1 TABLET BY MOUTH EVERY DAY 90 tablet 3  . tiotropium (SPIRIVA  HANDIHALER) 18 MCG inhalation capsule Place 1 capsule (18 mcg total) into inhaler and inhale daily. 30 capsule 2  . TRESIBA FLEXTOUCH 100 UNIT/ML FlexTouch Pen INJECT 15 UNITS SUBCUTANEOUSLY DAILY 3 mL 4   No current facility-administered medications for this encounter.    ECOG PERFORMANCE STATUS:  0 - Asymptomatic  REVIEW OF SYSTEMS: Patient denies any weight loss, fatigue, weakness, fever, chills or night sweats. Patient denies any loss of vision, blurred vision. Patient denies any ringing  of the ears or hearing loss. No irregular heartbeat. Patient denies heart murmur or history of fainting. Patient denies any chest pain or pain radiating to her upper extremities. Patient denies any shortness of breath, difficulty breathing at night, cough or hemoptysis. Patient denies any swelling in the lower legs. Patient denies any nausea vomiting, vomiting of blood, or coffee ground material in the vomitus. Patient denies any stomach pain. Patient states has had normal bowel movements no significant constipation or diarrhea. Patient denies any dysuria, hematuria or significant nocturia. Patient denies any problems walking, swelling in the joints or loss of balance. Patient denies any skin changes, loss of hair or loss of weight. Patient denies any excessive worrying or anxiety or significant depression. Patient denies any problems with insomnia. Patient denies excessive thirst, polyuria, polydipsia. Patient denies any swollen glands, patient denies easy bruising or easy bleeding. Patient denies any recent infections, allergies or URI. Patient "s visual fields have not changed significantly in recent time.   PHYSICAL EXAM: BP (!) 156/79   Pulse 68   Temp (!) 97.2 F (36.2 C) (Tympanic)   Wt 168 lb 8 oz (76.4 kg)   BMI 24.88 kg/m  Small resected lesion of the tip of the nose.  No evidence of submental cervical or supraclavicular adenopathy.  Does have another lesion on his left clavicle which is scheduled  to be excised.  Well-developed well-nourished patient in NAD. HEENT reveals PERLA, EOMI, discs not visualized.  Oral cavity is clear. No oral mucosal lesions are identified. Neck is clear without evidence of cervical or supraclavicular adenopathy. Lungs are clear to A&P. Cardiac examination is essentially unremarkable with regular rate and rhythm without murmur rub or thrill. Abdomen is benign with no organomegaly or masses noted. Motor sensory and DTR levels are equal and symmetric in the upper and lower extremities. Cranial nerves II through XII are grossly intact. Proprioception is intact. No peripheral adenopathy or edema is identified. No motor or sensory levels are noted. Crude visual fields are within normal range.  LABORATORY DATA: Pathology report reviewed    RADIOLOGY RESULTS: No current films to review   IMPRESSION: Basal cell carcinoma the nose tip in 87 year old male  PLAN: This time I recommended electron-beam therapy to his nose tip.  We will plan on delivering 50 Gray in 5 weeks.  Risks and benefits of treatment including erythematous change in the skin possible slight fatigue all were described to the patient and his daughter.  Both seem to comprehend  the treatment plan well.  I personally set up and ordered 6 simulation for next week.  I would like to take this opportunity to thank you for allowing me to participate in the care of your patient.Noreene Filbert, MD

## 2021-04-28 NOTE — Assessment & Plan Note (Signed)
Patient denies any chest pain or fluttering of the heart.

## 2021-04-28 NOTE — Assessment & Plan Note (Signed)

## 2021-04-28 NOTE — Assessment & Plan Note (Signed)
Stable at the present time. 

## 2021-04-28 NOTE — Assessment & Plan Note (Signed)
Blood pressure is stable on today's examination

## 2021-04-28 NOTE — Progress Notes (Signed)
Established Patient Office Visit  Subjective:  Patient ID: Michael Simon, male    DOB: [birth year]  Age: 87 y.o. MRN: [id number]  CC:  Chief Complaint  Patient presents with  . Hypertension  . Diabetes    HPI  Michael Simon presents for cancer of the nose.,  Patient was told by the dermatologist to get radiation treatment, he came for a reevaluation of the nasal lesion.  He is known to have hypertension bronchitis but doing relatively well.  Past Medical History:  Diagnosis Date  . Basal cell carcinoma 04/14/2021   left medial clavicle   . Basal cell carcinoma 04/14/2021   nose tip   . Basal cell carcinoma (BCC) of skin of nose 02/15/2021  . CAD (coronary artery disease)   . HLD (hyperlipidemia)   . HTN (hypertension)   . LBBB (left bundle branch block)   . Myocardial infarction ([place])   . Osteoarthritis   . Type II diabetes mellitus ([place])     Past Surgical History:  Procedure Laterality Date  . CARDIAC CATHETERIZATION    . CATARACT EXTRACTION    . COLONOSCOPY    . COLONOSCOPY WITH PROPOFOL N/A 08/26/2019   Procedure: COLONOSCOPY WITH PROPOFOL;  Surgeon: [name], MD;  Location: [facility] ENDOSCOPY;  Service: Gastroenterology;  Laterality: N/A;  . COLONOSCOPY WITH PROPOFOL N/A 09/22/2019   Procedure: COLONOSCOPY WITH PROPOFOL;  Surgeon: [name], MD;  Location: [facility] ENDOSCOPY;  Service: Gastroenterology;  Laterality: N/A;  pediatric colonoscope needed  . CORONARY ARTERY BYPASS GRAFT  2012  . EYE SURGERY    . INGUINAL HERNIA REPAIR    . JOINT REPLACEMENT     knees    Family History  Problem Relation Age of Onset  . Arthritis Mother   . Heart disease Mother   . Heart disease Other     Social History   Socioeconomic History  . Marital status: Married    Spouse name: Not on file  . Number of children: Not on file  . Years of education: Not on file  . Highest education level: Not on file  Occupational History  . Not on file  Tobacco Use  .  Smoking status: Former Smoker    Quit date: 03/03/2008    Years since quitting: 13.1  . Smokeless tobacco: Never Used  Vaping Use  . Vaping Use: Never used  Substance and Sexual Activity  . Alcohol use: No    Alcohol/week: 0.0 standard drinks  . Drug use: No  . Sexual activity: Not on file  Other Topics Concern  . Not on file  Social History Narrative  . Not on file   Social Determinants of Health   Financial Resource Strain: Not on file  Food Insecurity: Not on file  Transportation Needs: Not on file  Physical Activity: Not on file  Stress: Not on file  Social Connections: Not on file  Intimate Partner Violence: Not on file     Current Outpatient Medications:  .  albuterol (PROVENTIL) (2.5 MG/3ML) 0.083% nebulizer solution, Take 3 mLs (2.5 mg total) by nebulization every 6 (six) hours as needed for wheezing or shortness of breath., Disp: 150 mL, Rfl: 1 .  albuterol (VENTOLIN HFA) 108 (90 Base) MCG/ACT inhaler, TAKE 2 PUFFS BY MOUTH EVERY 6 HOURS AS NEEDED FOR WHEEZE OR SHORTNESS OF BREATH, Disp: 8.5 each, Rfl: 6 .  amiodarone (PACERONE) 200 MG tablet, Take 1 tablet (200 mg total) by mouth 2 (two) times daily., Disp: 60 tablet,  Rfl: 1 .  apixaban (ELIQUIS) 2.5 MG TABS tablet, Take 1 tablet (2.5 mg total) by mouth 2 (two) times daily., Disp: 60 tablet, Rfl: 2 .  budesonide-formoterol (SYMBICORT) 80-4.5 MCG/ACT inhaler, Inhale 2 puffs into the lungs 2 (two) times daily., Disp: 1 each, Rfl: 2 .  Cyanocobalamin 1000 MCG TBCR, Take 1,000 mcg by mouth daily. , Disp: , Rfl:  .  fluticasone (FLONASE) 50 MCG/ACT nasal spray, Place 2 sprays into both nostrils daily., Disp: 9.9 mL, Rfl: 0 .  furosemide (LASIX) 20 MG tablet, TAKE 1 TABLET BY MOUTH EVERY DAY, Disp: 90 tablet, Rfl: 1 .  Insulin Pen Needle (PEN NEEDLES) 33G X 4 MM MISC, Use to administer insulin daily, Disp: 100 each, Rfl: 6 .  levofloxacin (LEVAQUIN) 500 MG tablet, TAKE 1 TABLET (500 MG TOTAL) BY MOUTH DAILY., Disp: 7 tablet,  Rfl: 0 .  loratadine (CLARITIN) 10 MG tablet, Take 1 tablet (10 mg total) by mouth daily., Disp: 30 tablet, Rfl: 1 .  lovastatin (MEVACOR) 40 MG tablet, TAKE 1 TABLET BY MOUTH EVERYDAY AT BEDTIME, Disp: 90 tablet, Rfl: 2 .  pantoprazole (PROTONIX) 40 MG tablet, TAKE 1 TABLET BY MOUTH EVERY DAY, Disp: 90 tablet, Rfl: 3 .  tiotropium (SPIRIVA HANDIHALER) 18 MCG inhalation capsule, Place 1 capsule (18 mcg total) into inhaler and inhale daily., Disp: 30 capsule, Rfl: 2 .  TRESIBA FLEXTOUCH 100 UNIT/ML FlexTouch Pen, INJECT 15 UNITS SUBCUTANEOUSLY DAILY, Disp: 3 mL, Rfl: 4   No Known Allergies  ROS Review of Systems  Constitutional: Negative.   HENT: Negative.        Cancer of the tip of the nose.  Eyes: Negative.   Respiratory: Negative.   Cardiovascular: Negative.   Gastrointestinal: Negative.   Endocrine: Negative.   Genitourinary: Negative.   Musculoskeletal: Negative.   Skin: Negative.   Allergic/Immunologic: Negative.   Neurological: Negative.   Hematological: Negative.   Psychiatric/Behavioral: Negative.   All other systems reviewed and are negative.     Objective:    Physical Exam Vitals reviewed.  Constitutional:      Appearance: Normal appearance.  HENT:     Mouth/Throat:     Mouth: Mucous membranes are moist.  Eyes:     Pupils: Pupils are equal, round, and reactive to light.  Neck:     Vascular: No carotid bruit.  Cardiovascular:     Rate and Rhythm: Normal rate and regular rhythm.     Pulses: Normal pulses.     Heart sounds: Normal heart sounds.  Pulmonary:     Effort: Pulmonary effort is normal.     Breath sounds: Normal breath sounds.  Abdominal:     General: Bowel sounds are normal.     Palpations: Abdomen is soft. There is no hepatomegaly, splenomegaly or mass.     Tenderness: There is no abdominal tenderness.     Hernia: No hernia is present.  Musculoskeletal:     Cervical back: Neck supple.     Right lower leg: No edema.     Left lower leg: No  edema.  Skin:    Findings: No rash.     Comments: Basal cell cancer of the skin on the nose.  Neurological:     Mental Status: He is alert and oriented to person, place, and time.     Motor: No weakness.  Psychiatric:        Mood and Affect: Mood normal.        Behavior: Behavior normal.  BP 132/84   Pulse 86   Ht 5\' 9"  (1.753 m)   Wt 169 lb 4.8 oz (76.8 kg)   BMI 25.00 kg/m  Wt Readings from Last 3 Encounters:  04/28/21 168 lb 8 oz (76.4 kg)  04/26/21 169 lb 4.8 oz (76.8 kg)  02/15/21 164 lb 3.2 oz (74.5 kg)     Health Maintenance Due  Topic Date Due  . FOOT EXAM  Never done  . OPHTHALMOLOGY EXAM  Never done  . URINE MICROALBUMIN  Never done  . COVID-19 Vaccine (1) Never done  . TETANUS/TDAP  Never done  . PNA vac Low Risk Adult (2 of 2 - PCV13) 03/10/2012    There are no preventive care reminders to display for this patient.  Lab Results  Component Value Date   TSH 3.698 01/02/2021   Lab Results  Component Value Date   WBC 10.2 01/06/2021   HGB 9.9 (L) 01/06/2021   HCT 31.8 (L) 01/06/2021   MCV 98.5 01/06/2021   PLT 168 01/06/2021   Lab Results  Component Value Date   NA 137 01/06/2021   K 5.3 (H) 01/06/2021   CO2 25 01/06/2021   GLUCOSE 160 (H) 01/06/2021   BUN 44 (H) 01/06/2021   CREATININE 2.05 (H) 01/06/2021   BILITOT 0.9 01/02/2021   ALKPHOS 79 01/02/2021   AST 20 01/02/2021   ALT 18 01/02/2021   PROT 8.2 (H) 01/02/2021   ALBUMIN 4.1 01/02/2021   CALCIUM 8.1 (L) 01/06/2021   ANIONGAP 8 01/06/2021   No results found for: CHOL No results found for: HDL No results found for: LDLCALC No results found for: TRIG No results found for: CHOLHDL Lab Results  Component Value Date   HGBA1C 8.2 (H) 01/02/2021      Assessment & Plan:   Problem List Items Addressed This Visit      Cardiovascular and Mediastinum   Essential hypertension    Blood pressure is stable on today's examination      CAD (coronary artery disease) - Primary     Patient denies any chest pain or fluttering of the heart.        Respiratory   Hypoxia    Stable at the present time.        Endocrine   Diabetes mellitus ([place])    - The patient's blood sugar is under control on med. - The patient will continue the current treatment regimen.  - I encouraged the patient to regularly check blood sugar.  - I encouraged the patient to monitor diet. I encouraged the patient to eat low-carb and low-sugar to help prevent blood sugar spikes.  - I encouraged the patient to continue following their prescribed treatment plan for diabetes - I informed the patient to get help if blood sugar drops below 54mg /dL, or if suddenly have trouble thinking clearly or breathing.        I recommended to the patient that he should go ahead with radiotherapy  No orders of the defined types were placed in this encounter.   Follow-up: No follow-ups on file.    [name], MD

## 2021-05-05 ENCOUNTER — Ambulatory Visit
Admission: RE | Admit: 2021-05-05 | Discharge: 2021-05-05 | Disposition: A | Discharge status: Home / Self Care | Payer: Medicare HMO | Source: Ambulatory Visit | Attending: Radiation Oncology | Admitting: Radiation Oncology

## 2021-05-05 DIAGNOSIS — Z510 Encounter for antineoplastic radiation therapy: Secondary | ICD-10-CM | POA: Diagnosis present

## 2021-05-05 DIAGNOSIS — C44311 Basal cell carcinoma of skin of nose: Secondary | ICD-10-CM | POA: Insufficient documentation

## 2021-05-09 DIAGNOSIS — Z510 Encounter for antineoplastic radiation therapy: Secondary | ICD-10-CM | POA: Diagnosis not present

## 2021-05-09 DIAGNOSIS — C44311 Basal cell carcinoma of skin of nose: Secondary | ICD-10-CM | POA: Diagnosis not present

## 2021-05-11 ENCOUNTER — Ambulatory Visit
Admission: RE | Admit: 2021-05-11 | Discharge: 2021-05-11 | Disposition: A | Discharge status: Home / Self Care | Payer: Medicare HMO | Source: Ambulatory Visit | Attending: Radiation Oncology | Admitting: Radiation Oncology

## 2021-05-11 DIAGNOSIS — C44311 Basal cell carcinoma of skin of nose: Secondary | ICD-10-CM | POA: Diagnosis not present

## 2021-05-11 DIAGNOSIS — Z510 Encounter for antineoplastic radiation therapy: Secondary | ICD-10-CM | POA: Diagnosis not present

## 2021-05-12 ENCOUNTER — Ambulatory Visit
Admission: RE | Admit: 2021-05-12 | Discharge: 2021-05-12 | Disposition: A | Discharge status: Home / Self Care | Payer: Medicare HMO | Source: Ambulatory Visit | Attending: Radiation Oncology | Admitting: Radiation Oncology

## 2021-05-12 DIAGNOSIS — Z510 Encounter for antineoplastic radiation therapy: Secondary | ICD-10-CM | POA: Diagnosis not present

## 2021-05-12 DIAGNOSIS — C44311 Basal cell carcinoma of skin of nose: Secondary | ICD-10-CM | POA: Diagnosis not present

## 2021-05-13 ENCOUNTER — Ambulatory Visit
Admission: RE | Admit: 2021-05-13 | Discharge: 2021-05-13 | Disposition: A | Discharge status: Home / Self Care | Payer: Medicare HMO | Source: Ambulatory Visit | Attending: Radiation Oncology | Admitting: Radiation Oncology

## 2021-05-13 DIAGNOSIS — Z510 Encounter for antineoplastic radiation therapy: Secondary | ICD-10-CM | POA: Diagnosis not present

## 2021-05-13 DIAGNOSIS — C44311 Basal cell carcinoma of skin of nose: Secondary | ICD-10-CM | POA: Diagnosis not present

## 2021-05-15 ENCOUNTER — Other Ambulatory Visit: Payer: Self-pay | Admitting: Internal Medicine

## 2021-05-17 ENCOUNTER — Ambulatory Visit
Admission: RE | Admit: 2021-05-17 | Discharge: 2021-05-17 | Disposition: A | Discharge status: Home / Self Care | Payer: Medicare HMO | Source: Ambulatory Visit | Attending: Radiation Oncology | Admitting: Radiation Oncology

## 2021-05-17 DIAGNOSIS — C44311 Basal cell carcinoma of skin of nose: Secondary | ICD-10-CM | POA: Diagnosis not present

## 2021-05-17 DIAGNOSIS — Z510 Encounter for antineoplastic radiation therapy: Secondary | ICD-10-CM | POA: Diagnosis not present

## 2021-05-18 ENCOUNTER — Ambulatory Visit
Admission: RE | Admit: 2021-05-18 | Discharge: 2021-05-18 | Disposition: A | Discharge status: Home / Self Care | Payer: Medicare HMO | Source: Ambulatory Visit | Attending: Radiation Oncology | Admitting: Radiation Oncology

## 2021-05-18 DIAGNOSIS — Z510 Encounter for antineoplastic radiation therapy: Secondary | ICD-10-CM | POA: Diagnosis present

## 2021-05-18 DIAGNOSIS — C44311 Basal cell carcinoma of skin of nose: Secondary | ICD-10-CM | POA: Insufficient documentation

## 2021-05-19 ENCOUNTER — Ambulatory Visit
Admission: RE | Admit: 2021-05-19 | Discharge: 2021-05-19 | Disposition: A | Discharge status: Home / Self Care | Payer: Medicare HMO | Source: Ambulatory Visit | Attending: Radiation Oncology | Admitting: Radiation Oncology

## 2021-05-19 DIAGNOSIS — C44311 Basal cell carcinoma of skin of nose: Secondary | ICD-10-CM | POA: Diagnosis not present

## 2021-05-19 DIAGNOSIS — Z510 Encounter for antineoplastic radiation therapy: Secondary | ICD-10-CM | POA: Diagnosis not present

## 2021-05-20 ENCOUNTER — Ambulatory Visit
Admission: RE | Admit: 2021-05-20 | Discharge: 2021-05-20 | Disposition: A | Discharge status: Home / Self Care | Payer: Medicare HMO | Source: Ambulatory Visit | Attending: Radiation Oncology | Admitting: Radiation Oncology

## 2021-05-20 DIAGNOSIS — Z510 Encounter for antineoplastic radiation therapy: Secondary | ICD-10-CM | POA: Diagnosis not present

## 2021-05-20 DIAGNOSIS — C44311 Basal cell carcinoma of skin of nose: Secondary | ICD-10-CM | POA: Diagnosis not present

## 2021-05-23 ENCOUNTER — Ambulatory Visit
Admission: RE | Admit: 2021-05-23 | Discharge: 2021-05-23 | Disposition: A | Discharge status: Home / Self Care | Payer: Medicare HMO | Source: Ambulatory Visit | Attending: Radiation Oncology | Admitting: Radiation Oncology

## 2021-05-23 ENCOUNTER — Other Ambulatory Visit: Payer: Self-pay | Admitting: Internal Medicine

## 2021-05-23 DIAGNOSIS — C44311 Basal cell carcinoma of skin of nose: Secondary | ICD-10-CM | POA: Diagnosis not present

## 2021-05-23 DIAGNOSIS — Z510 Encounter for antineoplastic radiation therapy: Secondary | ICD-10-CM | POA: Diagnosis not present

## 2021-05-24 ENCOUNTER — Ambulatory Visit
Admission: RE | Admit: 2021-05-24 | Discharge: 2021-05-24 | Disposition: A | Discharge status: Home / Self Care | Payer: Medicare HMO | Source: Ambulatory Visit | Attending: Radiation Oncology | Admitting: Radiation Oncology

## 2021-05-24 DIAGNOSIS — Z510 Encounter for antineoplastic radiation therapy: Secondary | ICD-10-CM | POA: Diagnosis not present

## 2021-05-24 DIAGNOSIS — C44311 Basal cell carcinoma of skin of nose: Secondary | ICD-10-CM | POA: Diagnosis not present

## 2021-05-25 ENCOUNTER — Ambulatory Visit
Admission: RE | Admit: 2021-05-25 | Discharge: 2021-05-25 | Disposition: A | Discharge status: Home / Self Care | Payer: Medicare HMO | Source: Ambulatory Visit | Attending: Radiation Oncology | Admitting: Radiation Oncology

## 2021-05-25 DIAGNOSIS — Z510 Encounter for antineoplastic radiation therapy: Secondary | ICD-10-CM | POA: Diagnosis not present

## 2021-05-25 DIAGNOSIS — C44311 Basal cell carcinoma of skin of nose: Secondary | ICD-10-CM | POA: Diagnosis not present

## 2021-05-26 ENCOUNTER — Ambulatory Visit
Admission: RE | Admit: 2021-05-26 | Discharge: 2021-05-26 | Disposition: A | Discharge status: Home / Self Care | Payer: Medicare HMO | Source: Ambulatory Visit | Attending: Radiation Oncology | Admitting: Radiation Oncology

## 2021-05-26 DIAGNOSIS — Z510 Encounter for antineoplastic radiation therapy: Secondary | ICD-10-CM | POA: Diagnosis not present

## 2021-05-26 DIAGNOSIS — C44311 Basal cell carcinoma of skin of nose: Secondary | ICD-10-CM | POA: Diagnosis not present

## 2021-05-27 ENCOUNTER — Ambulatory Visit
Admission: RE | Admit: 2021-05-27 | Discharge: 2021-05-27 | Disposition: A | Discharge status: Home / Self Care | Payer: Medicare HMO | Source: Ambulatory Visit | Attending: Radiation Oncology | Admitting: Radiation Oncology

## 2021-05-27 DIAGNOSIS — C44311 Basal cell carcinoma of skin of nose: Secondary | ICD-10-CM | POA: Diagnosis not present

## 2021-05-27 DIAGNOSIS — Z510 Encounter for antineoplastic radiation therapy: Secondary | ICD-10-CM | POA: Diagnosis not present

## 2021-05-30 ENCOUNTER — Ambulatory Visit
Admission: RE | Admit: 2021-05-30 | Discharge: 2021-05-30 | Disposition: A | Discharge status: Home / Self Care | Payer: Medicare HMO | Source: Ambulatory Visit | Attending: Radiation Oncology | Admitting: Radiation Oncology

## 2021-05-30 DIAGNOSIS — C44311 Basal cell carcinoma of skin of nose: Secondary | ICD-10-CM | POA: Diagnosis not present

## 2021-05-30 DIAGNOSIS — Z510 Encounter for antineoplastic radiation therapy: Secondary | ICD-10-CM | POA: Diagnosis not present

## 2021-05-31 ENCOUNTER — Other Ambulatory Visit: Payer: Self-pay

## 2021-05-31 ENCOUNTER — Telehealth: Payer: Self-pay

## 2021-05-31 ENCOUNTER — Ambulatory Visit
Admission: RE | Admit: 2021-05-31 | Discharge: 2021-05-31 | Disposition: A | Discharge status: Home / Self Care | Payer: Medicare HMO | Source: Ambulatory Visit | Attending: Radiation Oncology | Admitting: Radiation Oncology

## 2021-05-31 ENCOUNTER — Encounter: Payer: Self-pay | Admitting: Dermatology

## 2021-05-31 ENCOUNTER — Ambulatory Visit (INDEPENDENT_AMBULATORY_CARE_PROVIDER_SITE_OTHER): Payer: Medicare HMO | Admitting: Dermatology

## 2021-05-31 DIAGNOSIS — D492 Neoplasm of unspecified behavior of bone, soft tissue, and skin: Secondary | ICD-10-CM

## 2021-05-31 DIAGNOSIS — L720 Epidermal cyst: Secondary | ICD-10-CM | POA: Diagnosis not present

## 2021-05-31 DIAGNOSIS — C4441 Basal cell carcinoma of skin of scalp and neck: Secondary | ICD-10-CM

## 2021-05-31 DIAGNOSIS — C44311 Basal cell carcinoma of skin of nose: Secondary | ICD-10-CM | POA: Diagnosis not present

## 2021-05-31 DIAGNOSIS — D485 Neoplasm of uncertain behavior of skin: Secondary | ICD-10-CM

## 2021-05-31 DIAGNOSIS — C44519 Basal cell carcinoma of skin of other part of trunk: Secondary | ICD-10-CM | POA: Diagnosis not present

## 2021-05-31 DIAGNOSIS — Z510 Encounter for antineoplastic radiation therapy: Secondary | ICD-10-CM | POA: Diagnosis not present

## 2021-05-31 MED ORDER — MUPIROCIN 2 % EX OINT: 1.0000 "application " | TOPICAL_OINTMENT | Freq: Every day | CUTANEOUS | 0 refills | Status: DC

## 2021-05-31 NOTE — Progress Notes (Signed)
Follow-Up Visit   Subjective  Michael MATSUO is a 87 y.o. male who presents for the following: Basal Cell Carcinoma (Biopsy proven BCC of left medial clavicle - [facility] today).  Accompanied by daughter  The following portions of the chart were reviewed this encounter and updated as appropriate:   Tobacco  Allergies  Meds  Problems  Med Hx  Surg Hx  Fam Hx      Review of Systems:  No other skin or systemic complaints except as noted in HPI or Assessment and Plan.  Objective  Well appearing patient in no apparent distress; mood and affect are within normal limits.  A focused examination was performed including chest. Relevant physical exam findings are noted in the Assessment and Plan.  L med clavicle Healing biopsy site  L med clavicle 1.2 cm firm SQ nodule    Assessment & Plan  Basal cell carcinoma (BCC) of skin of neck L neck / med clavicle  Skin excision  Lesion length (cm):  5.6 Lesion width (cm):  1.5 Margin per side (cm):  0.2 Total excision diameter (cm):  6 Informed consent: discussed and consent obtained   Timeout: patient name, date of birth, surgical site, and procedure verified   Procedure prep:  Patient was prepped and draped in usual sterile fashion Prep type:  Isopropyl alcohol and povidone-iodine Anesthesia: the lesion was anesthetized in a standard fashion   Anesthetic:  1% lidocaine w/ epinephrine 1-100,000 buffered w/ 8.4% NaHCO3 Instrument used: #15 blade   Hemostasis achieved with: pressure   Hemostasis achieved with comment:  Electrocautery Outcome: patient tolerated procedure well with no complications   Post-procedure details: sterile dressing applied and wound care instructions given   Dressing type: bandage and pressure dressing (mupirocin)    Skin repair Complexity:  Complex Final length (cm):  7.5 Reason for type of repair: reduce tension to allow closure, reduce the risk of dehiscence, infection, and necrosis, reduce  subcutaneous dead space and avoid a hematoma, allow closure of the large defect, preserve normal anatomy, preserve normal anatomical and functional relationships and enhance both functionality and cosmetic results   Undermining: area extensively undermined   Undermining comment:  Undermining defect 2.0 cm Subcutaneous layers (deep stitches):  Suture size:  3-0 Suture type: Vicryl (polyglactin 910)   Subcutaneous suture technique: inverted dermal. Fine/surface layer approximation (top stitches):  Suture size:  3-0 Suture type: nylon   Stitches: simple running   Suture removal (days):  7 Hemostasis achieved with: suture and pressure Outcome: patient tolerated procedure well with no complications   Post-procedure details: sterile dressing applied and wound care instructions given   Dressing type: bandage and pressure dressing (mupirocin)    mupirocin ointment (BACTROBAN) 2 % Apply 1 application topically daily. With dressing changes  Specimen 1 - Surgical pathology Differential Diagnosis: Biopsy proven BCC Check Margins:Yes [id number]  Neoplasm of uncertain behavior of skin L neck / med clavicle  Skin excision  Lesion length (cm):  1.2 Lesion width (cm):  1.2 Margin per side (cm):  0 Total excision diameter (cm):  1.2 Informed consent: discussed and consent obtained   Timeout: patient name, date of birth, surgical site, and procedure verified   Procedure prep:  Patient was prepped and draped in usual sterile fashion Prep type:  Isopropyl alcohol and povidone-iodine Anesthesia: the lesion was anesthetized in a standard fashion   Anesthetic:  1% lidocaine w/ epinephrine 1-100,000 buffered w/ 8.4% NaHCO3 Instrument used: #15 blade   Hemostasis achieved with: pressure  Hemostasis achieved with comment:  Electrocautery Outcome: patient tolerated procedure well with no complications   Post-procedure details: sterile dressing applied and wound care instructions given   Dressing  type: bandage and pressure dressing (mupirocin)   Additional details:  Simple excision performed today   Specimen 2 - Surgical pathology Differential Diagnosis: D48.5 r/o cyst vs other  Check Margins: No 1.2 cm firm SQ nodule  Return in about 1 week (around 06/07/2021) for suture removal.  I, Michael Simon, CMA, am acting as scribe for Michael Ser, MD .  Documentation: I have reviewed the above documentation for accuracy and completeness, and I agree with the above.  Michael Ser, MD

## 2021-05-31 NOTE — Telephone Encounter (Signed)
Spoke with patient regarding surgery. He is doing fine/hd  

## 2021-05-31 NOTE — Patient Instructions (Signed)
Wound Care Instructions  Cleanse wound gently with soap and water once a day then pat dry with clean gauze. Apply a thing coat of Petrolatum (petroleum jelly, "Vaseline") over the wound (unless you have an allergy to this). We recommend that you use a new, sterile tube of Vaseline. Do not pick or remove scabs. Do not remove the yellow or white "healing tissue" from the base of the wound.  Cover the wound with fresh, clean, nonstick gauze and secure with paper tape. You may use Band-Aids in place of gauze and tape if the would is small enough, but would recommend trimming much of the tape off as there is often too much. Sometimes Band-Aids can irritate the skin.  You should call the office for your biopsy report after 1 week if you have not already been contacted.  If you experience any problems, such as abnormal amounts of bleeding, swelling, significant bruising, significant pain, or evidence of infection, please call the office immediately.  FOR ADULT SURGERY PATIENTS: If you need something for pain relief you may take 1 extra strength Tylenol (acetaminophen) AND 2 Ibuprofen (200mg each) together every 4 hours as needed for pain. (do not take these if you are allergic to them or if you have a reason you should not take them.) Typically, you may only need pain medication for 1 to 3 days.   If you have any questions or concerns for your doctor, please call our main line at [phone] and press option 4 to reach your doctor's medical assistant. If no one answers, please leave a voicemail as directed and we will return your call as soon as possible. Messages left after 4 pm will be answered the following business day.   You may also send us a message via MyChart. We typically respond to MyChart messages within 1-2 business days.  For prescription refills, please ask your pharmacy to contact our office. Our fax number is [phone].  If you have an urgent issue when the clinic is closed that  cannot wait until the next business day, you can page your doctor at the number below.    Please note that while we do our best to be available for urgent issues outside of office hours, we are not available 24/7.   If you have an urgent issue and are unable to reach us, you may choose to seek medical care at your doctor's office, retail clinic, urgent care center, or emergency room.  If you have a medical emergency, please immediately call 911 or go to the emergency department.  Pager Numbers  - Dr. Kowalski: [phone]  - Dr. Moye: [phone]  - Dr. Stewart: [phone]  In the event of inclement weather, please call our main line at [phone] for an update on the status of any delays or closures.  Dermatology Medication Tips: Please keep the boxes that topical medications come in in order to help keep track of the instructions about where and how to use these. Pharmacies typically print the medication instructions only on the boxes and not directly on the medication tubes.   If your medication is too expensive, please contact our office at [phone] option 4 or send us a message through MyChart.   We are unable to tell what your co-pay for medications will be in advance as this is different depending on your insurance coverage. However, we may be able to find a substitute medication at lower cost or fill out paperwork to get insurance to cover a needed   medication.   If a prior authorization is required to get your medication covered by your insurance company, please allow us 1-2 business days to complete this process.  Drug prices often vary depending on where the prescription is filled and some pharmacies may offer cheaper prices.  The website www.goodrx.com contains coupons for medications through different pharmacies. The prices here do not account for what the cost may be with help from insurance (it may be cheaper with your insurance), but the website can give you the  price if you did not use any insurance.  - You can print the associated coupon and take it with your prescription to the pharmacy.  - You may also stop by our office during regular business hours and pick up a GoodRx coupon card.  - If you need your prescription sent electronically to a different pharmacy, notify our office through [place] MyChart or by phone at [phone] option 4.   

## 2021-06-01 ENCOUNTER — Ambulatory Visit
Admission: RE | Admit: 2021-06-01 | Discharge: 2021-06-01 | Disposition: A | Discharge status: Home / Self Care | Payer: Medicare HMO | Source: Ambulatory Visit | Attending: Radiation Oncology | Admitting: Radiation Oncology

## 2021-06-01 DIAGNOSIS — C44311 Basal cell carcinoma of skin of nose: Secondary | ICD-10-CM | POA: Diagnosis not present

## 2021-06-01 DIAGNOSIS — Z510 Encounter for antineoplastic radiation therapy: Secondary | ICD-10-CM | POA: Diagnosis not present

## 2021-06-02 ENCOUNTER — Ambulatory Visit
Admission: RE | Admit: 2021-06-02 | Discharge: 2021-06-02 | Disposition: A | Discharge status: Home / Self Care | Payer: Medicare HMO | Source: Ambulatory Visit | Attending: Radiation Oncology | Admitting: Radiation Oncology

## 2021-06-02 DIAGNOSIS — C44311 Basal cell carcinoma of skin of nose: Secondary | ICD-10-CM | POA: Diagnosis not present

## 2021-06-02 DIAGNOSIS — Z510 Encounter for antineoplastic radiation therapy: Secondary | ICD-10-CM | POA: Diagnosis not present

## 2021-06-03 ENCOUNTER — Ambulatory Visit
Admission: RE | Admit: 2021-06-03 | Discharge: 2021-06-03 | Disposition: A | Discharge status: Home / Self Care | Payer: Medicare HMO | Source: Ambulatory Visit | Attending: Radiation Oncology | Admitting: Radiation Oncology

## 2021-06-03 DIAGNOSIS — Z510 Encounter for antineoplastic radiation therapy: Secondary | ICD-10-CM | POA: Diagnosis not present

## 2021-06-03 DIAGNOSIS — C44311 Basal cell carcinoma of skin of nose: Secondary | ICD-10-CM | POA: Diagnosis not present

## 2021-06-06 ENCOUNTER — Ambulatory Visit
Admission: RE | Admit: 2021-06-06 | Discharge: 2021-06-06 | Disposition: A | Discharge status: Home / Self Care | Payer: Medicare HMO | Source: Ambulatory Visit | Attending: Radiation Oncology | Admitting: Radiation Oncology

## 2021-06-06 DIAGNOSIS — Z510 Encounter for antineoplastic radiation therapy: Secondary | ICD-10-CM | POA: Diagnosis not present

## 2021-06-06 DIAGNOSIS — C44311 Basal cell carcinoma of skin of nose: Secondary | ICD-10-CM | POA: Diagnosis not present

## 2021-06-07 ENCOUNTER — Ambulatory Visit (INDEPENDENT_AMBULATORY_CARE_PROVIDER_SITE_OTHER): Payer: Medicare HMO | Admitting: Dermatology

## 2021-06-07 ENCOUNTER — Other Ambulatory Visit: Payer: Self-pay

## 2021-06-07 ENCOUNTER — Encounter: Payer: Self-pay | Admitting: Dermatology

## 2021-06-07 ENCOUNTER — Ambulatory Visit
Admission: RE | Admit: 2021-06-07 | Discharge: 2021-06-07 | Disposition: A | Discharge status: Home / Self Care | Payer: Medicare HMO | Source: Ambulatory Visit | Attending: Radiation Oncology | Admitting: Radiation Oncology

## 2021-06-07 DIAGNOSIS — C44311 Basal cell carcinoma of skin of nose: Secondary | ICD-10-CM | POA: Diagnosis not present

## 2021-06-07 DIAGNOSIS — I447 Left bundle-branch block, unspecified: Secondary | ICD-10-CM | POA: Diagnosis not present

## 2021-06-07 DIAGNOSIS — I483 Typical atrial flutter: Secondary | ICD-10-CM | POA: Diagnosis not present

## 2021-06-07 DIAGNOSIS — E7800 Pure hypercholesterolemia, unspecified: Secondary | ICD-10-CM | POA: Diagnosis not present

## 2021-06-07 DIAGNOSIS — I2510 Atherosclerotic heart disease of native coronary artery without angina pectoris: Secondary | ICD-10-CM | POA: Diagnosis not present

## 2021-06-07 DIAGNOSIS — Z85828 Personal history of other malignant neoplasm of skin: Secondary | ICD-10-CM

## 2021-06-07 DIAGNOSIS — E119 Type 2 diabetes mellitus without complications: Secondary | ICD-10-CM | POA: Diagnosis not present

## 2021-06-07 DIAGNOSIS — I10 Essential (primary) hypertension: Secondary | ICD-10-CM | POA: Diagnosis not present

## 2021-06-07 DIAGNOSIS — Z510 Encounter for antineoplastic radiation therapy: Secondary | ICD-10-CM | POA: Diagnosis not present

## 2021-06-07 DIAGNOSIS — L578 Other skin changes due to chronic exposure to nonionizing radiation: Secondary | ICD-10-CM

## 2021-06-07 DIAGNOSIS — I2584 Coronary atherosclerosis due to calcified coronary lesion: Secondary | ICD-10-CM | POA: Diagnosis not present

## 2021-06-07 NOTE — Progress Notes (Signed)
   Follow-Up Visit   Subjective  Michael Simon is a 87 y.o. male who presents for the following: suture removal .  The following portions of the chart were reviewed this encounter and updated as appropriate:   Tobacco  Allergies  Meds  Problems  Med Hx  Surg Hx  Fam Hx      Review of Systems:  No other skin or systemic complaints except as noted in HPI or Assessment and Plan.  Objective  Well appearing patient in no apparent distress; mood and affect are within normal limits.  A focused examination was performed including the chest and face. Relevant physical exam findings are noted in the Assessment and Plan.   Assessment & Plan  History of basal cell carcinoma of skin - margins free L neck / clavicle Healed well. Sutures removed; steri strips applied.  Actinic Damage - chronic, secondary to cumulative UV radiation exposure/sun exposure over time - diffuse scaly erythematous macules with underlying dyspigmentation - Recommend daily broad spectrum sunscreen SPF 30+ to sun-exposed areas, reapply every 2 hours as needed.  - Recommend staying in the shade or wearing long sleeves, sun glasses (UVA+UVB protection) and wide brim hats (4-inch brim around the entire circumference of the hat). - Call for new or changing lesions.  Return in about 4 months (around 10/07/2021).  Luther Redo, CMA, am acting as scribe for Sarina Ser, MD .  Documentation: I have reviewed the above documentation for accuracy and completeness, and I agree with the above.  Sarina Ser, MD

## 2021-06-07 NOTE — Patient Instructions (Signed)

## 2021-06-08 ENCOUNTER — Ambulatory Visit
Admission: RE | Admit: 2021-06-08 | Discharge: 2021-06-08 | Disposition: A | Discharge status: Home / Self Care | Payer: Medicare HMO | Source: Ambulatory Visit | Attending: Radiation Oncology | Admitting: Radiation Oncology

## 2021-06-08 DIAGNOSIS — Z510 Encounter for antineoplastic radiation therapy: Secondary | ICD-10-CM | POA: Diagnosis not present

## 2021-06-08 DIAGNOSIS — C44311 Basal cell carcinoma of skin of nose: Secondary | ICD-10-CM | POA: Diagnosis not present

## 2021-06-09 ENCOUNTER — Ambulatory Visit
Admission: RE | Admit: 2021-06-09 | Discharge: 2021-06-09 | Disposition: A | Discharge status: Home / Self Care | Payer: Medicare HMO | Source: Ambulatory Visit | Attending: Radiation Oncology | Admitting: Radiation Oncology

## 2021-06-09 DIAGNOSIS — Z510 Encounter for antineoplastic radiation therapy: Secondary | ICD-10-CM | POA: Diagnosis not present

## 2021-06-09 DIAGNOSIS — C44311 Basal cell carcinoma of skin of nose: Secondary | ICD-10-CM | POA: Diagnosis not present

## 2021-06-10 ENCOUNTER — Ambulatory Visit
Admission: RE | Admit: 2021-06-10 | Discharge: 2021-06-10 | Disposition: A | Discharge status: Home / Self Care | Payer: Medicare HMO | Source: Ambulatory Visit | Attending: Radiation Oncology | Admitting: Radiation Oncology

## 2021-06-10 ENCOUNTER — Encounter: Payer: Self-pay | Admitting: Dermatology

## 2021-06-10 DIAGNOSIS — Z510 Encounter for antineoplastic radiation therapy: Secondary | ICD-10-CM | POA: Diagnosis not present

## 2021-06-10 DIAGNOSIS — C44311 Basal cell carcinoma of skin of nose: Secondary | ICD-10-CM | POA: Diagnosis not present

## 2021-06-13 ENCOUNTER — Ambulatory Visit
Admission: RE | Admit: 2021-06-13 | Discharge: 2021-06-13 | Disposition: A | Discharge status: Home / Self Care | Payer: Medicare HMO | Source: Ambulatory Visit | Attending: Radiation Oncology | Admitting: Radiation Oncology

## 2021-06-13 DIAGNOSIS — C44311 Basal cell carcinoma of skin of nose: Secondary | ICD-10-CM | POA: Diagnosis not present

## 2021-06-13 DIAGNOSIS — Z510 Encounter for antineoplastic radiation therapy: Secondary | ICD-10-CM | POA: Diagnosis not present

## 2021-06-14 ENCOUNTER — Ambulatory Visit
Admission: RE | Admit: 2021-06-14 | Discharge: 2021-06-14 | Disposition: A | Discharge status: Home / Self Care | Payer: Medicare HMO | Source: Ambulatory Visit | Attending: Radiation Oncology | Admitting: Radiation Oncology

## 2021-06-14 DIAGNOSIS — Z510 Encounter for antineoplastic radiation therapy: Secondary | ICD-10-CM | POA: Diagnosis not present

## 2021-06-14 DIAGNOSIS — C44311 Basal cell carcinoma of skin of nose: Secondary | ICD-10-CM | POA: Diagnosis not present

## 2021-06-15 ENCOUNTER — Ambulatory Visit
Admission: RE | Admit: 2021-06-15 | Discharge: 2021-06-15 | Disposition: A | Discharge status: Home / Self Care | Payer: Medicare HMO | Source: Ambulatory Visit | Attending: Radiation Oncology | Admitting: Radiation Oncology

## 2021-06-15 DIAGNOSIS — Z510 Encounter for antineoplastic radiation therapy: Secondary | ICD-10-CM | POA: Diagnosis not present

## 2021-06-15 DIAGNOSIS — C44311 Basal cell carcinoma of skin of nose: Secondary | ICD-10-CM | POA: Diagnosis not present

## 2021-06-30 ENCOUNTER — Emergency Department: Payer: Medicare HMO

## 2021-06-30 ENCOUNTER — Other Ambulatory Visit: Payer: Self-pay

## 2021-06-30 ENCOUNTER — Emergency Department
Admission: EM | Admit: 2021-06-30 | Discharge: 2021-06-30 | Disposition: A | Discharge status: Home / Self Care | Payer: Medicare HMO | Attending: Emergency Medicine | Admitting: Emergency Medicine

## 2021-06-30 ENCOUNTER — Ambulatory Visit
Admission: EM | Admit: 2021-06-30 | Discharge: 2021-06-30 | Disposition: A | Discharge status: Home / Self Care | Payer: Medicare HMO

## 2021-06-30 ENCOUNTER — Encounter: Payer: Self-pay | Admitting: Emergency Medicine

## 2021-06-30 DIAGNOSIS — Z794 Long term (current) use of insulin: Secondary | ICD-10-CM | POA: Diagnosis not present

## 2021-06-30 DIAGNOSIS — I2510 Atherosclerotic heart disease of native coronary artery without angina pectoris: Secondary | ICD-10-CM | POA: Diagnosis not present

## 2021-06-30 DIAGNOSIS — Z7951 Long term (current) use of inhaled steroids: Secondary | ICD-10-CM | POA: Diagnosis not present

## 2021-06-30 DIAGNOSIS — E1169 Type 2 diabetes mellitus with other specified complication: Secondary | ICD-10-CM | POA: Insufficient documentation

## 2021-06-30 DIAGNOSIS — Z79899 Other long term (current) drug therapy: Secondary | ICD-10-CM | POA: Diagnosis not present

## 2021-06-30 DIAGNOSIS — Z87891 Personal history of nicotine dependence: Secondary | ICD-10-CM | POA: Diagnosis not present

## 2021-06-30 DIAGNOSIS — Z85828 Personal history of other malignant neoplasm of skin: Secondary | ICD-10-CM | POA: Insufficient documentation

## 2021-06-30 DIAGNOSIS — Z951 Presence of aortocoronary bypass graft: Secondary | ICD-10-CM | POA: Diagnosis not present

## 2021-06-30 DIAGNOSIS — E785 Hyperlipidemia, unspecified: Secondary | ICD-10-CM | POA: Diagnosis not present

## 2021-06-30 DIAGNOSIS — K5900 Constipation, unspecified: Secondary | ICD-10-CM | POA: Insufficient documentation

## 2021-06-30 DIAGNOSIS — I10 Essential (primary) hypertension: Secondary | ICD-10-CM | POA: Insufficient documentation

## 2021-06-30 DIAGNOSIS — Z96653 Presence of artificial knee joint, bilateral: Secondary | ICD-10-CM | POA: Insufficient documentation

## 2021-06-30 DIAGNOSIS — K5902 Outlet dysfunction constipation: Secondary | ICD-10-CM | POA: Diagnosis not present

## 2021-06-30 DIAGNOSIS — R109 Unspecified abdominal pain: Secondary | ICD-10-CM | POA: Diagnosis not present

## 2021-06-30 DIAGNOSIS — Z7901 Long term (current) use of anticoagulants: Secondary | ICD-10-CM | POA: Insufficient documentation

## 2021-06-30 NOTE — ED Provider Notes (Signed)
[facility] Emergency Department Provider Note  ____________________________________________  Time seen: Approximately 3:34 PM  I have reviewed the triage vital signs and the nursing notes.   HISTORY  Chief Complaint Constipation and Abdominal Pain   HPI Michael Simon is a 87 y.o. male presenting to the emergency department for treatment and evaluation of constipation.  He currently denies abdominal pain.  Last normal bowel movement was bowel a week ago.  He has been using over-the-counter medications including laxatives and stool softeners without any relief.  Past Medical History:  Diagnosis Date   Basal cell carcinoma 04/14/2021   left medial clavicle EDC 05/31/2021   Basal cell carcinoma 04/14/2021   nose tip    Basal cell carcinoma (BCC) of skin of nose 02/15/2021   CAD (coronary artery disease)    Diverticulitis 2022   HLD (hyperlipidemia)    HTN (hypertension)    LBBB (left bundle branch block)    Myocardial infarction ([place])    Osteoarthritis    Type II diabetes mellitus [facility])     Patient Active Problem List   Diagnosis Date Noted   Basal cell carcinoma (BCC) of skin of nose 02/15/2021   Dyspnea 01/11/2021   Diverticulitis    Hypoxia    Acute respiratory failure with hypoxia ([place]) 01/05/2021   Anemia 01/05/2021   AKI (acute kidney injury) ([place]) 01/05/2021   Acute diverticulitis 01/04/2021   Atrial flutter ([place]) 01/02/2021   Bronchitis 12/21/2020   Suspected COVID-19 virus infection 12/21/2020   Annual physical exam 12/07/2020   Paronychia of great toe, right 04/23/2020   Fever 05/30/2018   Weakness 02/02/2018   Abscess of right earlobe 02/02/2018   Generalized weakness 02/02/2018   Hematochezia 07/26/2015   Diabetes mellitus ([place]) 07/26/2015   Essential hypertension 07/26/2015   HLD (hyperlipidemia) 07/26/2015   CAD (coronary artery disease) 07/26/2015    Past Surgical History:  Procedure Laterality Date   CARDIAC  CATHETERIZATION     CATARACT EXTRACTION     COLONOSCOPY     COLONOSCOPY WITH PROPOFOL N/A 08/26/2019   Procedure: COLONOSCOPY WITH PROPOFOL;  Surgeon: [name], MD;  Location: [facility] ENDOSCOPY;  Service: Gastroenterology;  Laterality: N/A;   COLONOSCOPY WITH PROPOFOL N/A 09/22/2019   Procedure: COLONOSCOPY WITH PROPOFOL;  Surgeon: [name], MD;  Location: [facility] ENDOSCOPY;  Service: Gastroenterology;  Laterality: N/A;  pediatric colonoscope needed   CORONARY ARTERY BYPASS GRAFT  2012   EYE SURGERY     INGUINAL HERNIA REPAIR     JOINT REPLACEMENT     knees    Prior to Admission medications   Medication Sig Start Date End Date Taking? Authorizing Provider  albuterol (PROVENTIL) (2.5 MG/3ML) 0.083% nebulizer solution Take 3 mLs (2.5 mg total) by nebulization every 6 (six) hours as needed for wheezing or shortness of breath. 02/15/21   [name], MD  albuterol (VENTOLIN HFA) 108 (90 Base) MCG/ACT inhaler TAKE 2 PUFFS BY MOUTH EVERY 6 HOURS AS NEEDED FOR WHEEZE OR SHORTNESS OF BREATH 02/02/21   [name], MD  amiodarone (PACERONE) 200 MG tablet Take 1 tablet (200 mg total) by mouth 2 (two) times daily. 01/06/21   [name], MD  apixaban (ELIQUIS) 2.5 MG TABS tablet Take 1 tablet (2.5 mg total) by mouth 2 (two) times daily. 01/06/21   [name], MD  budesonide-formoterol [facility]) 80-4.5 MCG/ACT inhaler Inhale 2 puffs into the lungs 2 (two) times daily. 01/06/21   [name], MD  Cyanocobalamin 1000 MCG TBCR Take  1,000 mcg by mouth daily.     [provider]  fluticasone (FLONASE) 50 MCG/ACT nasal spray Place 2 sprays into both nostrils daily. 01/07/21   [name], MD  furosemide (LASIX) 20 MG tablet TAKE 1 TABLET BY MOUTH EVERY DAY 03/02/21   [name], MD  Insulin Pen Needle (PEN NEEDLES) 33G X 4 MM MISC Use to administer insulin daily 01/11/21   [name], MD  levofloxacin (LEVAQUIN) 500 MG tablet TAKE 1 TABLET (500 MG TOTAL) BY MOUTH DAILY.  03/24/21   Beckie Salts, FNP  lisinopril (ZESTRIL) 30 MG tablet TAKE 1 TABLET BY MOUTH EVERY DAY 05/24/21   [name], MD  loratadine (CLARITIN) 10 MG tablet Take 1 tablet (10 mg total) by mouth daily. 01/07/21   [name], MD  lovastatin (MEVACOR) 40 MG tablet TAKE 1 TABLET BY MOUTH EVERYDAY AT BEDTIME 02/16/21   [name], MD  mupirocin ointment (BACTROBAN) 2 % Apply 1 application topically daily. With dressing changes 05/31/21   [name], MD  pantoprazole (PROTONIX) 40 MG tablet TAKE 1 TABLET BY MOUTH EVERY DAY 08/10/20   [name], MD  tiotropium (SPIRIVA HANDIHALER) 18 MCG inhalation capsule Place 1 capsule (18 mcg total) into inhaler and inhale daily. 01/06/21   [name], MD  TRESIBA FLEXTOUCH 100 UNIT/ML FlexTouch Pen INJECT 15 UNITS SUBCUTANEOUSLY DAILY 02/21/21   [name], MD    Allergies Patient has no known allergies.  Family History  Problem Relation Age of Onset   Arthritis Mother    Heart disease Mother    Heart disease Other     Social History Social History   Tobacco Use   Smoking status: Former    Types: Cigarettes    Quit date: 03/03/2008    Years since quitting: 13.3   Smokeless tobacco: Never  Vaping Use   Vaping Use: Never used  Substance Use Topics   Alcohol use: No    Alcohol/week: 0.0 standard drinks   Drug use: No    Review of Systems Constitutional: Negative for fever. ENT: Negative for sore throat. Respiratory: Negative for cough or shortness of breath Gastrointestinal: Positive abdominal pain.  No nausea, no vomiting.  No diarrhea.  Positive constipation Musculoskeletal: Negative for generalized body aches. Skin: Negative for rash/lesion/wound. Neurological: Negative for headaches, focal weakness or numbness.  ____________________________________________   PHYSICAL EXAM:  VITAL SIGNS: ED Triage Vitals  Enc Vitals Group     BP 06/30/21 1251 (!) 118/53     Pulse Rate 06/30/21 1251 67     Resp 06/30/21  1251 18     Temp 06/30/21 1251 98.4 F (36.9 C)     Temp Source 06/30/21 1251 Oral     SpO2 06/30/21 1251 98 %     Weight 06/30/21 1246 168 lb 6.9 oz (76.4 kg)     Height 06/30/21 1246 5\' 9"  (1.753 m)     Head Circumference --      Peak Flow --      Pain Score --      Pain Loc --      Pain Edu? --      Excl. in [place]? --     Constitutional: Alert and oriented. Well appearing and in no acute distress. Eyes: Conjunctivae are normal.  Head: Atraumatic. Nose: No congestion/rhinnorhea. Mouth/Throat: Mucous membranes are moist. Neck: No stridor.  Cardiovascular: Normal rate, regular rhythm. Good peripheral circulation. Respiratory: Normal respiratory effort. Musculoskeletal: Full ROM throughout.  Abdominal: Soft, bowel sounds  present x 4 quadrants.  Neurologic:  Normal speech and language. No gross focal neurologic deficits are appreciated. Speech is normal. No gait instability. Skin:  Skin is warm, dry and intact. No rash noted. Psychiatric: Mood and affect are normal. Speech and behavior are normal.  ____________________________________________   LABS (all labs ordered are listed, but only abnormal results are displayed)  Labs Reviewed - No data to display ____________________________________________  EKG  Not indicated. ____________________________________________  RADIOLOGY   ____________________________________________   PROCEDURES  Moderate stool throughout with moderate formed stool in rectum. ____________________________________________   INITIAL IMPRESSION / ASSESSMENT AND PLAN / ED COURSE  Clinical Course as of 06/30/21 1713  Thu Jun 30, 2021  1504 DG Abd Portable 2 Views [CT]    Clinical Course User Index [CT] Sherrie George B, FNP   Patient reports tha after x-ray and while waiting for ER room assignment, he went to the restroom and had a large bowel movement.  He states that he now feels much better and would like to be discharged home.  Patient was  advised that he should follow-up with his primary care provider or gastrointestinal specialist if constipation becomes a recurrent issue.  He was advised to return to the emergency department for abdominal pain or for any symptoms of concern if unable to see his primary care provider.  Pertinent labs & imaging results that were available during my care of the patient were reviewed by me and considered in my medical decision making (see chart for details). ____________________________________________   FINAL CLINICAL IMPRESSION(S) / ED DIAGNOSES  Final diagnoses:  Constipation, unspecified constipation type       Victorino Dike, FNP 06/30/21 1714    Blake Divine, MD 06/30/21 2337

## 2021-06-30 NOTE — ED Triage Notes (Addendum)
Patient is being discharged from the Urgent Care and sent to the Emergency Department via private vehicle with son . Per Serafina Royals, patient is in need of higher level of care due to need for higher level of care. Patient is aware and verbalizes understanding of plan of care.  Vitals:   06/30/21 1120  BP: 96/60  Pulse: 77  Resp: 18  Temp: 98 F (36.7 C)  SpO2: 98%

## 2021-06-30 NOTE — Discharge Instructions (Addendum)
MiraLax or stool softener daily unless diarrhea.  Follow up with primary care.

## 2021-06-30 NOTE — ED Provider Notes (Signed)
Chief Complaint   Chief Complaint  Patient presents with   Abdominal Pain     Subjective, HPI  Michael Simon is a very pleasant 87 y.o. male who presents with abdominal pain and constipation for the last 8 days.  Patient states that he has not had any bowel movement whatsoever for 8 days.  He also reports that his last bowel movement was "explosive".  Patient reports that his umbilical hernia has been more bothersome as of late.  Patient states that he has had decreased urine output and reports "dribbling" of urine in the mornings.  Patient states he has a constant urge to defecate, but states that he is not able to pass stool.  Patient reports sharp pain in his abdominal region.  Patient reports use of over-the-counter laxatives and stool softeners without relief.  Patient reports a history of diverticulitis.  Patient denies any vomiting, nausea, heartburn, fever, chills, early satiety.  Patient also denies any blood in urine or stool.  He also does not report any bleeding to rectum with straining to defecate.  History obtained from patient.   Patient's problem list, past medical and social history, medications, and allergies were reviewed by me and updated in Epic.    ROS  See HPI.  Objective   Vitals:   06/30/21 1120  BP: 96/60  Pulse: 77  Resp: 18  Temp: 98 F (36.7 C)  SpO2: 98%    Vital signs and nursing note reviewed.   No LMP for male patient.   General: Appears well-developed and well-nourished. No acute distress.  HEENT Head: Normocephalic and atraumatic.  Eyes: Conjunctivae and EOM are normal. No eye drainage or scleral icterus bilaterally.  Ears: Hearing grossly intact, no drainage or visible deformity.  Nose: No nasal deviation or rhinorrhea.  Mouth/Throat: No stridor or tracheal deviation.  Neck: Normal range of motion, neck is supple.  Cardiovascular: Normal rate Pulm/Chest: No respiratory distress. Breath sounds normal bilaterally without wheezes,  rhonchi, or rales.  Abdominal: Soft, distended, and tender without rebound or guarding. Bowel tones normotonic in all quadrants.  Neurological: Alert and oriented to person, place, and time.  Skin: Skin is warm and dry.  Psychiatric: Normal mood, affect, behavior, and thought content.   Assessment & Plan  1. Constipation due to outlet dysfunction  No orders of the defined types were placed in this encounter.    87 y.o. male presents with abdominal pain and constipation for the last 8 days.  Patient states that he has not had any bowel movement whatsoever for 8 days.  He also reports that his last bowel movement was "explosive".  Patient reports that his umbilical hernia has been more bothersome as of late.  Patient states that he has had decreased urine output and reports "dribbling" of urine in the mornings.  Patient states he has a constant urge to defecate, but states that he is not able to pass stool.  Patient reports sharp pain in his abdominal region.  Patient reports use of over-the-counter laxatives and stool softeners without relief.  Patient reports a history of diverticulitis.  Patient denies any vomiting, nausea, heartburn, fever, chills, early satiety.  Patient also denies any blood in urine or stool.  He also does not report any bleeding to rectum with straining to defecate.  Chart review completed.  Upon pressing on Michael Simon abdomen he reported 8-9/10 pain with palpation and given his symptoms, I feel as if he would be best served in the emergency department to  rule out obstruction.  Patient stable on discharge.  Patient to report to [facility] emergency department via private vehicle driven by his family member who was present with him during our exam today.  Return as needed.  Plan:   Discharge Instructions      Your current condition warrants further evaluation and/or treatment which exceed services available to you in this urgent care setting. I have discussed with  you your currrent condition and the need for further evaluation and/or treatment in an emergency department setting. In response to my medical recommendation, you have opted to go to the emergency department at: [place], FNP-C 06/30/21   This note was partially made with the aid of speech-to-text dictation; typographical errors are not intentional.    Serafina Royals, [place] 06/30/21 1216

## 2021-06-30 NOTE — Discharge Instructions (Addendum)
Your current condition warrants further evaluation and/or treatment which exceed services available to you in this urgent care setting. I have discussed with you your currrent condition and the need for further evaluation and/or treatment in an emergency department setting. In response to my medical recommendation, you have opted to go to the emergency department at: [facility]

## 2021-06-30 NOTE — ED Triage Notes (Signed)
Pt presents with lower abdominal pain.  Has not had a BM in 8 days.  The last BM was "explosive".  Pt has umbilical hernia which has been more bothersome in last week.  Abdomen not TTP but pt reports feeling distended.  Denies nausea, fever.  States he only urinates a little at a time.  (Also only drinking one bottle of water per day plus two cups of water in morning.)   Pt also reports having clots on tissues every morning when blowing nose.  Just finished radiation for cancer on nose last Wednesday.

## 2021-06-30 NOTE — ED Provider Notes (Signed)
HPI: Pt is a 87 y.o. male who presents with complaints of constipation   The patient p/w  constipation, Pt denies any pain   ROS: Denies fever, chest pain, vomiting  Past Medical History:  Diagnosis Date   Basal cell carcinoma 04/14/2021   left medial clavicle [facility] 05/31/2021   Basal cell carcinoma 04/14/2021   nose tip    Basal cell carcinoma (BCC) of skin of nose 02/15/2021   CAD (coronary artery disease)    Diverticulitis 2022   HLD (hyperlipidemia)    HTN (hypertension)    LBBB (left bundle branch block)    Myocardial infarction ([place])    Osteoarthritis    Type II diabetes mellitus ([place])    Vitals:   06/30/21 1251  BP: (!) 118/53  Pulse: 67  Resp: 18  Temp: 98.4 F (36.9 C)  SpO2: 98%    Focused Physical Exam: Gen: No acute distress Head: atraumatic, normocephalic Eyes: Extraocular movements grossly intact; conjunctiva clear CV: RRR Lung: No increased WOB, no stridor GI: ND, no obvious masses Neuro: Alert and awake  Medical Decision Making and Plan: Given the patient's initial medical screening exam, the following diagnostic evaluation has been ordered. The patient will be placed in the appropriate treatment space, once one is available, to complete the evaluation and treatment. I have discussed the plan of care with the patient and I have advised the patient that an ED physician or mid-level practitioner will reevaluate their condition after the test results have been received, as the results may give them additional insight into the type of treatment they may need.   Diagnostics: xray. Pt denies abdominal pain at this time.   Treatments: none immediately   Vanessa [place], MD 06/30/21 1254

## 2021-06-30 NOTE — ED Triage Notes (Signed)
Presents from home with constipation  denies any abd pain at present or n/v  last BM was about 1 week ago

## 2021-07-11 ENCOUNTER — Ambulatory Visit: Payer: Medicare HMO | Admitting: Radiation Oncology

## 2021-07-26 ENCOUNTER — Encounter: Payer: Self-pay | Admitting: Internal Medicine

## 2021-07-26 ENCOUNTER — Other Ambulatory Visit: Payer: Self-pay

## 2021-07-26 ENCOUNTER — Ambulatory Visit (INDEPENDENT_AMBULATORY_CARE_PROVIDER_SITE_OTHER): Payer: Medicare HMO | Admitting: Internal Medicine

## 2021-07-26 DIAGNOSIS — E1159 Type 2 diabetes mellitus with other circulatory complications: Secondary | ICD-10-CM

## 2021-07-26 DIAGNOSIS — I2510 Atherosclerotic heart disease of native coronary artery without angina pectoris: Secondary | ICD-10-CM | POA: Diagnosis not present

## 2021-07-26 DIAGNOSIS — Z794 Long term (current) use of insulin: Secondary | ICD-10-CM

## 2021-07-26 DIAGNOSIS — I483 Typical atrial flutter: Secondary | ICD-10-CM | POA: Diagnosis not present

## 2021-07-26 DIAGNOSIS — I10 Essential (primary) hypertension: Secondary | ICD-10-CM | POA: Diagnosis not present

## 2021-07-26 DIAGNOSIS — R531 Weakness: Secondary | ICD-10-CM | POA: Diagnosis not present

## 2021-07-26 NOTE — Assessment & Plan Note (Signed)
Drink a lot of fluid

## 2021-07-26 NOTE — Progress Notes (Signed)
Established Patient Office Visit  Subjective:  Patient ID: Michael Simon, male    DOB: 10-Dec-1933  Age: 87 y.o. MRN: [id number]  CC:  Chief Complaint  Patient presents with   Diabetes    Patient checked blood sugar from home and it was 167    Diabetes   Michael Simon presents for general checkup.  He denies any chest pain shortness of breath or paroxysmal nocturnal dyspnea.  There is no swelling of the legs and there is no abdominal pain.  Past Medical History:  Diagnosis Date   Basal cell carcinoma 04/14/2021   left medial clavicle EDC 05/31/2021   Basal cell carcinoma 04/14/2021   nose tip    Basal cell carcinoma (BCC) of skin of nose 02/15/2021   CAD (coronary artery disease)    Diverticulitis 2022   HLD (hyperlipidemia)    HTN (hypertension)    LBBB (left bundle branch block)    Myocardial infarction ([place])    Osteoarthritis    Type II diabetes mellitus ([place])     Past Surgical History:  Procedure Laterality Date   CARDIAC CATHETERIZATION     CATARACT EXTRACTION     COLONOSCOPY     COLONOSCOPY WITH PROPOFOL N/A 08/26/2019   Procedure: COLONOSCOPY WITH PROPOFOL;  Surgeon: [name], MD;  Location: [facility] ENDOSCOPY;  Service: Gastroenterology;  Laterality: N/A;   COLONOSCOPY WITH PROPOFOL N/A 09/22/2019   Procedure: COLONOSCOPY WITH PROPOFOL;  Surgeon: [name], MD;  Location: [facility] ENDOSCOPY;  Service: Gastroenterology;  Laterality: N/A;  pediatric colonoscope needed   CORONARY ARTERY BYPASS GRAFT  2012   EYE SURGERY     INGUINAL HERNIA REPAIR     JOINT REPLACEMENT     knees    Family History  Problem Relation Age of Onset   Arthritis Mother    Heart disease Mother    Heart disease Other     Social History   Socioeconomic History   Marital status: Widowed    Spouse name: Not on file   Number of children: Not on file   Years of education: Not on file   Highest education level: Not on file  Occupational History   Not on file  Tobacco Use    Smoking status: Former    Types: Cigarettes    Quit date: 03/03/2008    Years since quitting: 13.4   Smokeless tobacco: Never  Vaping Use   Vaping Use: Never used  Substance and Sexual Activity   Alcohol use: No    Alcohol/week: 0.0 standard drinks   Drug use: No   Sexual activity: Not on file  Other Topics Concern   Not on file  Social History Narrative   Not on file   Social Determinants of Health   Financial Resource Strain: Not on file  Food Insecurity: Not on file  Transportation Needs: Not on file  Physical Activity: Not on file  Stress: Not on file  Social Connections: Not on file  Intimate Partner Violence: Not on file     Current Outpatient Medications:    albuterol (PROVENTIL) (2.5 MG/3ML) 0.083% nebulizer solution, Take 3 mLs (2.5 mg total) by nebulization every 6 (six) hours as needed for wheezing or shortness of breath., Disp: 150 mL, Rfl: 1   albuterol (VENTOLIN HFA) 108 (90 Base) MCG/ACT inhaler, TAKE 2 PUFFS BY MOUTH EVERY 6 HOURS AS NEEDED FOR WHEEZE OR SHORTNESS OF BREATH, Disp: 8.5 each, Rfl: 6   amiodarone (PACERONE) 200 MG tablet, Take 1 tablet (200  mg total) by mouth 2 (two) times daily., Disp: 60 tablet, Rfl: 1   apixaban (ELIQUIS) 2.5 MG TABS tablet, Take 1 tablet (2.5 mg total) by mouth 2 (two) times daily., Disp: 60 tablet, Rfl: 2   budesonide-formoterol (SYMBICORT) 80-4.5 MCG/ACT inhaler, Inhale 2 puffs into the lungs 2 (two) times daily., Disp: 1 each, Rfl: 2   Cyanocobalamin 1000 MCG TBCR, Take 1,000 mcg by mouth daily. , Disp: , Rfl:    fluticasone (FLONASE) 50 MCG/ACT nasal spray, Place 2 sprays into both nostrils daily., Disp: 9.9 mL, Rfl: 0   furosemide (LASIX) 20 MG tablet, TAKE 1 TABLET BY MOUTH EVERY DAY, Disp: 90 tablet, Rfl: 1   Insulin Pen Needle (PEN NEEDLES) 33G X 4 MM MISC, Use to administer insulin daily, Disp: 100 each, Rfl: 6   levofloxacin (LEVAQUIN) 500 MG tablet, TAKE 1 TABLET (500 MG TOTAL) BY MOUTH DAILY., Disp: 7 tablet, Rfl:  0   lisinopril (ZESTRIL) 30 MG tablet, TAKE 1 TABLET BY MOUTH EVERY DAY, Disp: 90 tablet, Rfl: 3   loratadine (CLARITIN) 10 MG tablet, Take 1 tablet (10 mg total) by mouth daily., Disp: 30 tablet, Rfl: 1   lovastatin (MEVACOR) 40 MG tablet, TAKE 1 TABLET BY MOUTH EVERYDAY AT BEDTIME, Disp: 90 tablet, Rfl: 2   mupirocin ointment (BACTROBAN) 2 %, Apply 1 application topically daily. With dressing changes, Disp: 22 g, Rfl: 0   pantoprazole (PROTONIX) 40 MG tablet, TAKE 1 TABLET BY MOUTH EVERY DAY, Disp: 90 tablet, Rfl: 3   tiotropium (SPIRIVA HANDIHALER) 18 MCG inhalation capsule, Place 1 capsule (18 mcg total) into inhaler and inhale daily., Disp: 30 capsule, Rfl: 2   TRESIBA FLEXTOUCH 100 UNIT/ML FlexTouch Pen, INJECT 15 UNITS SUBCUTANEOUSLY DAILY, Disp: 3 mL, Rfl: 4   No Known Allergies  ROS Review of Systems  Constitutional: Negative.   HENT: Negative.    Eyes: Negative.   Respiratory: Negative.    Cardiovascular: Negative.   Gastrointestinal: Negative.   Endocrine: Negative.   Genitourinary: Negative.   Musculoskeletal: Negative.   Skin: Negative.   Allergic/Immunologic: Negative.   Neurological: Negative.   Hematological: Negative.   Psychiatric/Behavioral: Negative.    All other systems reviewed and are negative.    Objective:    Physical Exam Vitals reviewed.  Constitutional:      Appearance: Normal appearance.  HENT:     Mouth/Throat:     Mouth: Mucous membranes are moist.  Eyes:     Pupils: Pupils are equal, round, and reactive to light.  Neck:     Vascular: No carotid bruit.  Cardiovascular:     Rate and Rhythm: Normal rate and regular rhythm.     Pulses: Normal pulses.     Heart sounds: Normal heart sounds.  Pulmonary:     Effort: Pulmonary effort is normal.     Breath sounds: Normal breath sounds.  Abdominal:     General: Bowel sounds are normal.     Palpations: Abdomen is soft. There is no hepatomegaly, splenomegaly or mass.     Tenderness: There is no  abdominal tenderness.     Hernia: No hernia is present.  Musculoskeletal:     Cervical back: Neck supple.     Right lower leg: No edema.     Left lower leg: No edema.  Skin:    Findings: No rash.  Neurological:     Mental Status: He is alert and oriented to person, place, and time.     Motor: No weakness.  Psychiatric:  Mood and Affect: Mood normal.        Behavior: Behavior normal.    BP 118/62   Pulse 76   Ht 5\' 9"  (1.753 m)   Wt 173 lb (78.5 kg)   BMI 25.55 kg/m  Wt Readings from Last 3 Encounters:  07/26/21 173 lb (78.5 kg)  06/30/21 168 lb 6.9 oz (76.4 kg)  04/28/21 168 lb 8 oz (76.4 kg)     Health Maintenance Due  Topic Date Due   COVID-19 Vaccine (1) Never done   FOOT EXAM  Never done   OPHTHALMOLOGY EXAM  Never done   TETANUS/TDAP  Never done   Zoster Vaccines- Shingrix (1 of 2) Never done   PNA vac Low Risk Adult (2 of 2 - PCV13) 03/10/2012   HEMOGLOBIN A1C  07/02/2021   INFLUENZA VACCINE  07/18/2021    There are no preventive care reminders to display for this patient.  Lab Results  Component Value Date   TSH 3.698 01/02/2021   Lab Results  Component Value Date   WBC 10.2 01/06/2021   HGB 9.9 (L) 01/06/2021   HCT 31.8 (L) 01/06/2021   MCV 98.5 01/06/2021   PLT 168 01/06/2021   Lab Results  Component Value Date   NA 137 01/06/2021   K 5.3 (H) 01/06/2021   CO2 25 01/06/2021   GLUCOSE 160 (H) 01/06/2021   BUN 44 (H) 01/06/2021   CREATININE 2.05 (H) 01/06/2021   BILITOT 0.9 01/02/2021   ALKPHOS 79 01/02/2021   AST 20 01/02/2021   ALT 18 01/02/2021   PROT 8.2 (H) 01/02/2021   ALBUMIN 4.1 01/02/2021   CALCIUM 8.1 (L) 01/06/2021   ANIONGAP 8 01/06/2021   No results found for: CHOL No results found for: HDL No results found for: LDLCALC No results found for: TRIG No results found for: CHOLHDL Lab Results  Component Value Date   HGBA1C 8.2 (H) 01/02/2021      Assessment & Plan:   Problem List Items Addressed This Visit        Cardiovascular and Mediastinum   Essential hypertension     Patient denies any chest pain or shortness of breath there is no history of palpitation or paroxysmal nocturnal dyspnea   patient was advised to follow low-salt low-cholesterol diet    ideally I want to keep systolic blood pressure below 130 mmHg, patient was asked to check blood pressure one times a week and give me a report on that.  Patient will be follow-up in 3 months  or earlier as needed, patient will call me back for any change in the cardiovascular symptoms Patient was advised to buy a book from local bookstore concerning blood pressure and read several chapters  every day.  This will be supplemented by some of the material we will give him from the office.  Patient should also utilize other resources like YouTube and Internet to learn more about the blood pressure and the diet.       CAD (coronary artery disease)    Stable at the present time       Atrial flutter ([place])    Stable         Endocrine   Diabetes mellitus ([place])    - The patient's blood sugar is labile on med. - The patient will continue the current treatment regimen.  - I encouraged the patient to regularly check blood sugar.  - I encouraged the patient to monitor diet. I encouraged the patient to eat low-carb  and low-sugar to help prevent blood sugar spikes.  - I encouraged the patient to continue following their prescribed treatment plan for diabetes - I informed the patient to get help if blood sugar drops below 54mg /dL, or if suddenly have trouble thinking clearly or breathing.  Patient was advised to buy a book on diabetes from a local bookstore or from [place]  Patient should read 2 chapters every day to keep the motivation going, this is in addition to some of the materials we provided them from the office.  There are other resources on the Internet like YouTube and wilkipedia to get an education on the diabetes         Other   Generalized  weakness    Drink a lot of fluid        No orders of the defined types were placed in this encounter.   Follow-up: No follow-ups on file.    [name], MD

## 2021-07-26 NOTE — Assessment & Plan Note (Signed)
Stable at the present time. 

## 2021-07-26 NOTE — Assessment & Plan Note (Signed)

## 2021-07-26 NOTE — Assessment & Plan Note (Signed)
Stable

## 2021-07-26 NOTE — Assessment & Plan Note (Signed)

## 2021-08-01 ENCOUNTER — Ambulatory Visit (INDEPENDENT_AMBULATORY_CARE_PROVIDER_SITE_OTHER): Payer: Medicare HMO | Admitting: Internal Medicine

## 2021-08-01 DIAGNOSIS — J9601 Acute respiratory failure with hypoxia: Secondary | ICD-10-CM | POA: Diagnosis not present

## 2021-08-01 DIAGNOSIS — I10 Essential (primary) hypertension: Secondary | ICD-10-CM

## 2021-08-01 DIAGNOSIS — Z794 Long term (current) use of insulin: Secondary | ICD-10-CM

## 2021-08-01 DIAGNOSIS — E1159 Type 2 diabetes mellitus with other circulatory complications: Secondary | ICD-10-CM

## 2021-08-01 DIAGNOSIS — I2510 Atherosclerotic heart disease of native coronary artery without angina pectoris: Secondary | ICD-10-CM

## 2021-08-01 DIAGNOSIS — R3915 Urgency of urination: Secondary | ICD-10-CM

## 2021-08-01 LAB — POCT URINALYSIS DIPSTICK
Bilirubin, UA: NEGATIVE
Blood, UA: NEGATIVE
Glucose, UA: NEGATIVE
Ketones, UA: NEGATIVE
Leukocytes, UA: NEGATIVE
Nitrite, UA: NEGATIVE
Protein, UA: NEGATIVE
Spec Grav, UA: 1.02 (ref 1.010–1.025)
Urobilinogen, UA: 0.2 E.U./dL
pH, UA: 6 (ref 5.0–8.0)

## 2021-08-05 ENCOUNTER — Ambulatory Visit: Payer: Medicare HMO | Admitting: Radiation Oncology

## 2021-08-08 ENCOUNTER — Other Ambulatory Visit: Payer: Self-pay | Admitting: Internal Medicine

## 2021-08-10 ENCOUNTER — Ambulatory Visit: Payer: Medicare HMO | Admitting: Radiation Oncology

## 2021-08-22 ENCOUNTER — Encounter: Payer: Self-pay | Admitting: Internal Medicine

## 2021-08-22 DIAGNOSIS — R3915 Urgency of urination: Secondary | ICD-10-CM | POA: Insufficient documentation

## 2021-08-22 NOTE — Assessment & Plan Note (Signed)

## 2021-08-22 NOTE — Assessment & Plan Note (Signed)

## 2021-08-22 NOTE — Progress Notes (Signed)
Established Patient Office Visit  Subjective:  Patient ID: Michael Simon, male    DOB: [birth year]  Age: 87 y.o. MRN: [id number]  CC: No chief complaint on file.   HPI  Michael Simon presents for check up  Past Medical History:  Diagnosis Date   Basal cell carcinoma 04/14/2021   left medial clavicle EDC 05/31/2021   Basal cell carcinoma 04/14/2021   nose tip    Basal cell carcinoma (BCC) of skin of nose 02/15/2021   CAD (coronary artery disease)    Diverticulitis 2022   HLD (hyperlipidemia)    HTN (hypertension)    LBBB (left bundle branch block)    Myocardial infarction ([place])    Osteoarthritis    Type II diabetes mellitus ([place])     Past Surgical History:  Procedure Laterality Date   CARDIAC CATHETERIZATION     CATARACT EXTRACTION     COLONOSCOPY     COLONOSCOPY WITH PROPOFOL N/A 08/26/2019   Procedure: COLONOSCOPY WITH PROPOFOL;  Surgeon: [name], MD;  Location: [facility] ENDOSCOPY;  Service: Gastroenterology;  Laterality: N/A;   COLONOSCOPY WITH PROPOFOL N/A 09/22/2019   Procedure: COLONOSCOPY WITH PROPOFOL;  Surgeon: [name], MD;  Location: [facility] ENDOSCOPY;  Service: Gastroenterology;  Laterality: N/A;  pediatric colonoscope needed   CORONARY ARTERY BYPASS GRAFT  2012   EYE SURGERY     INGUINAL HERNIA REPAIR     JOINT REPLACEMENT     knees    Family History  Problem Relation Age of Onset   Arthritis Mother    Heart disease Mother    Heart disease Other     Social History   Socioeconomic History   Marital status: Widowed    Spouse name: Not on file   Number of children: Not on file   Years of education: Not on file   Highest education level: Not on file  Occupational History   Not on file  Tobacco Use   Smoking status: Former    Types: Cigarettes    Quit date: 03/03/2008    Years since quitting: 13.4   Smokeless tobacco: Never  Vaping Use   Vaping Use: Never used  Substance and Sexual Activity   Alcohol use: No    Alcohol/week: 0.0  standard drinks   Drug use: No   Sexual activity: Not on file  Other Topics Concern   Not on file  Social History Narrative   Not on file   Social Determinants of Health   Financial Resource Strain: Not on file  Food Insecurity: Not on file  Transportation Needs: Not on file  Physical Activity: Not on file  Stress: Not on file  Social Connections: Not on file  Intimate Partner Violence: Not on file     Current Outpatient Medications:    albuterol (PROVENTIL) (2.5 MG/3ML) 0.083% nebulizer solution, Take 3 mLs (2.5 mg total) by nebulization every 6 (six) hours as needed for wheezing or shortness of breath., Disp: 150 mL, Rfl: 1   albuterol (VENTOLIN HFA) 108 (90 Base) MCG/ACT inhaler, TAKE 2 PUFFS BY MOUTH EVERY 6 HOURS AS NEEDED FOR WHEEZE OR SHORTNESS OF BREATH, Disp: 8.5 each, Rfl: 6   amiodarone (PACERONE) 200 MG tablet, Take 1 tablet (200 mg total) by mouth 2 (two) times daily., Disp: 60 tablet, Rfl: 1   apixaban (ELIQUIS) 2.5 MG TABS tablet, Take 1 tablet (2.5 mg total) by mouth 2 (two) times daily., Disp: 60 tablet, Rfl: 2   budesonide-formoterol (SYMBICORT) 80-4.5 MCG/ACT inhaler, Inhale 2  puffs into the lungs 2 (two) times daily., Disp: 1 each, Rfl: 2   Cyanocobalamin 1000 MCG TBCR, Take 1,000 mcg by mouth daily. , Disp: , Rfl:    fluticasone (FLONASE) 50 MCG/ACT nasal spray, Place 2 sprays into both nostrils daily., Disp: 9.9 mL, Rfl: 0   furosemide (LASIX) 20 MG tablet, TAKE 1 TABLET BY MOUTH EVERY DAY, Disp: 90 tablet, Rfl: 1   Insulin Pen Needle (PEN NEEDLES) 33G X 4 MM MISC, Use to administer insulin daily, Disp: 100 each, Rfl: 6   levofloxacin (LEVAQUIN) 500 MG tablet, TAKE 1 TABLET (500 MG TOTAL) BY MOUTH DAILY., Disp: 7 tablet, Rfl: 0   lisinopril (ZESTRIL) 30 MG tablet, TAKE 1 TABLET BY MOUTH EVERY DAY, Disp: 90 tablet, Rfl: 3   loratadine (CLARITIN) 10 MG tablet, Take 1 tablet (10 mg total) by mouth daily., Disp: 30 tablet, Rfl: 1   lovastatin (MEVACOR) 40 MG tablet,  TAKE 1 TABLET BY MOUTH EVERYDAY AT BEDTIME, Disp: 90 tablet, Rfl: 2   mupirocin ointment (BACTROBAN) 2 %, Apply 1 application topically daily. With dressing changes, Disp: 22 g, Rfl: 0   pantoprazole (PROTONIX) 40 MG tablet, TAKE 1 TABLET BY MOUTH EVERY DAY, Disp: 90 tablet, Rfl: 3   tiotropium (SPIRIVA HANDIHALER) 18 MCG inhalation capsule, Place 1 capsule (18 mcg total) into inhaler and inhale daily., Disp: 30 capsule, Rfl: 2   TRESIBA FLEXTOUCH 100 UNIT/ML FlexTouch Pen, INJECT 15 UNITS SUBCUTANEOUSLY DAILY, Disp: 3 mL, Rfl: 4   No Known Allergies  ROS Review of Systems  Constitutional:  Negative for chills.  HENT:  Negative for congestion.   Respiratory:  Positive for cough.   Allergic/Immunologic: Negative for food allergies.  Neurological:  Positive for tremors.     Objective:    Physical Exam Cardiovascular:     Rate and Rhythm: Normal rate.  Pulmonary:     Breath sounds: Wheezing present.  Musculoskeletal:        General: Normal range of motion.  Skin:    General: Skin is warm.  Neurological:     General: No focal deficit present.    There were no vitals taken for this visit. Wt Readings from Last 3 Encounters:  07/26/21 173 lb (78.5 kg)  06/30/21 168 lb 6.9 oz (76.4 kg)  04/28/21 168 lb 8 oz (76.4 kg)     Health Maintenance Due  Topic Date Due   FOOT EXAM  Never done   OPHTHALMOLOGY EXAM  Never done   TETANUS/TDAP  Never done   Zoster Vaccines- Shingrix (1 of 2) Never done   PNA vac Low Risk Adult (2 of 2 - PCV13) 03/10/2012   COVID-19 Vaccine (3 - Pfizer risk series) 04/08/2020   HEMOGLOBIN A1C  07/02/2021   INFLUENZA VACCINE  07/18/2021    There are no preventive care reminders to display for this patient.  Lab Results  Component Value Date   TSH 3.698 01/02/2021   Lab Results  Component Value Date   WBC 10.2 01/06/2021   HGB 9.9 (L) 01/06/2021   HCT 31.8 (L) 01/06/2021   MCV 98.5 01/06/2021   PLT 168 01/06/2021   Lab Results  Component  Value Date   NA 137 01/06/2021   K 5.3 (H) 01/06/2021   CO2 25 01/06/2021   GLUCOSE 160 (H) 01/06/2021   BUN 44 (H) 01/06/2021   CREATININE 2.05 (H) 01/06/2021   BILITOT 0.9 01/02/2021   ALKPHOS 79 01/02/2021   AST 20 01/02/2021   ALT 18 01/02/2021  PROT 8.2 (H) 01/02/2021   ALBUMIN 4.1 01/02/2021   CALCIUM 8.1 (L) 01/06/2021   ANIONGAP 8 01/06/2021   No results found for: CHOL No results found for: HDL No results found for: LDLCALC No results found for: TRIG No results found for: CHOLHDL Lab Results  Component Value Date   HGBA1C 8.2 (H) 01/02/2021      Assessment & Plan:   Problem List Items Addressed This Visit       Cardiovascular and Mediastinum   Essential hypertension    The following hypertensive lifestyle modification were recommended and discussed:  1. Limiting alcohol intake to less than 1 oz/day of ethanol:(24 oz of beer or 8 oz of wine or 2 oz of 100-proof whiskey). 2. Take baby ASA 81 mg daily. 3. Importance of regular aerobic exercise and losing weight. 4. Reduce dietary saturated fat and cholesterol intake for overall cardiovascular health. 5. Maintaining adequate dietary potassium, calcium, and magnesium intake. 6. Regular monitoring of the blood pressure. 7. Reduce sodium intake to less than 100 mmol/day (less than 2.3 gm of sodium or less than 6 gm of sodium choride)       CAD (coronary artery disease)    Denies any chest pain or shortness of breath        Respiratory   Acute respiratory failure with hypoxia (HCC)    Stable at the present time        Endocrine   Diabetes mellitus ([place])    - The patient's blood sugar is labile on med. - The patient will continue the current treatment regimen.  - I encouraged the patient to regularly check blood sugar.  - I encouraged the patient to monitor diet. I encouraged the patient to eat low-carb and low-sugar to help prevent blood sugar spikes.  - I encouraged the patient to continue following  their prescribed treatment plan for diabetes - I informed the patient to get help if blood sugar drops below 54mg /dL, or if suddenly have trouble thinking clearly or breathing.  Patient was advised to buy a book on diabetes from a local bookstore or from [place]  Patient should read 2 chapters every day to keep the motivation going, this is in addition to some of the materials we provided them from the office.  There are other resources on the Internet like YouTube and wilkipedia to get an education on the diabetes        Other   Urgency of urination - Primary    We will check we will check urine specimen was taken, dipstick was unremarkable      Relevant Orders   POCT urinalysis dipstick (Completed)    No orders of the defined types were placed in this encounter.   Follow-up: No follow-ups on file.    [name], MD

## 2021-08-22 NOTE — Assessment & Plan Note (Signed)
We will check we will check urine specimen was taken, dipstick was unremarkable

## 2021-08-22 NOTE — Assessment & Plan Note (Signed)
Denies any chest pain or shortness of breath 

## 2021-08-22 NOTE — Assessment & Plan Note (Signed)
Stable at the present time. 

## 2021-08-29 ENCOUNTER — Other Ambulatory Visit (INDEPENDENT_AMBULATORY_CARE_PROVIDER_SITE_OTHER): Payer: Medicare HMO

## 2021-08-29 DIAGNOSIS — R3915 Urgency of urination: Secondary | ICD-10-CM

## 2021-08-29 LAB — POCT URINALYSIS DIPSTICK
Bilirubin, UA: NEGATIVE
Blood, UA: NEGATIVE
Glucose, UA: NEGATIVE
Ketones, UA: POSITIVE
Leukocytes, UA: NEGATIVE
Nitrite, UA: NEGATIVE
Protein, UA: POSITIVE — AB
Spec Grav, UA: 1.02 (ref 1.010–1.025)
Urobilinogen, UA: 0.2 E.U./dL
pH, UA: 6 (ref 5.0–8.0)

## 2021-08-31 ENCOUNTER — Ambulatory Visit (INDEPENDENT_AMBULATORY_CARE_PROVIDER_SITE_OTHER): Payer: Medicare HMO | Admitting: Urology

## 2021-08-31 ENCOUNTER — Encounter: Payer: Self-pay | Admitting: Urology

## 2021-08-31 ENCOUNTER — Encounter: Payer: Self-pay | Admitting: Internal Medicine

## 2021-08-31 ENCOUNTER — Other Ambulatory Visit: Payer: Self-pay

## 2021-08-31 ENCOUNTER — Ambulatory Visit (INDEPENDENT_AMBULATORY_CARE_PROVIDER_SITE_OTHER): Payer: Medicare HMO | Admitting: Internal Medicine

## 2021-08-31 VITALS — BP 138/66 | HR 70 | Ht 69.0 in | Wt 171.1 lb

## 2021-08-31 VITALS — BP 138/66 | HR 70 | Ht 68.0 in | Wt 170.0 lb

## 2021-08-31 DIAGNOSIS — I2510 Atherosclerotic heart disease of native coronary artery without angina pectoris: Secondary | ICD-10-CM

## 2021-08-31 DIAGNOSIS — I483 Typical atrial flutter: Secondary | ICD-10-CM

## 2021-08-31 DIAGNOSIS — Z794 Long term (current) use of insulin: Secondary | ICD-10-CM

## 2021-08-31 DIAGNOSIS — I10 Essential (primary) hypertension: Secondary | ICD-10-CM | POA: Diagnosis not present

## 2021-08-31 DIAGNOSIS — E1159 Type 2 diabetes mellitus with other circulatory complications: Secondary | ICD-10-CM

## 2021-08-31 DIAGNOSIS — Z23 Encounter for immunization: Secondary | ICD-10-CM

## 2021-08-31 DIAGNOSIS — N401 Enlarged prostate with lower urinary tract symptoms: Secondary | ICD-10-CM

## 2021-08-31 DIAGNOSIS — R531 Weakness: Secondary | ICD-10-CM

## 2021-08-31 DIAGNOSIS — N138 Other obstructive and reflux uropathy: Secondary | ICD-10-CM

## 2021-08-31 DIAGNOSIS — N4282 Prostatosis syndrome: Secondary | ICD-10-CM | POA: Diagnosis not present

## 2021-08-31 DIAGNOSIS — R339 Retention of urine, unspecified: Secondary | ICD-10-CM

## 2021-08-31 LAB — BLADDER SCAN AMB NON-IMAGING

## 2021-08-31 MED ORDER — LEVOFLOXACIN 500 MG PO TABS
500.0000 mg | ORAL_TABLET | Freq: Every day | ORAL | 0 refills | Status: DC
Start: 2021-08-31 — End: 2021-09-10

## 2021-08-31 MED ORDER — TAMSULOSIN HCL 0.4 MG PO CAPS: 0.4000 mg | ORAL_CAPSULE | Freq: Every day | ORAL | 3 refills | Status: DC

## 2021-08-31 NOTE — Progress Notes (Signed)
Patient ID: Michael Simon, male   DOB: 1933-08-26, 87 y.o.   MRN: [id number] Simple Catheter Placement  Due to urinary retention patient is present today for a foley cath placement.  Patient was cleaned and prepped in a sterile fashion with betadine. A 20 FR foley catheter was inserted, urine return was noted  847ml, urine was dark in color.  The balloon was filled with 10cc of sterile water.  A leg bag was attached for drainage. Patient was also given a night bag to take home and was given instruction on how to change from one bag to another.  Patient was given instruction on proper catheter care.  Patient tolerated well, no complications were noted   Performed by: Dr. [name]  Additional notes/ Follow up: 10 days for Voiding Trial

## 2021-08-31 NOTE — Assessment & Plan Note (Signed)
Stable on medication

## 2021-08-31 NOTE — Progress Notes (Signed)
08/31/21 3:30 PM   Michael Simon 1933-02-23 [id number]  CC: Urinary symptoms, urinary retention  HPI: I saw Mr. Michael Simon as an urgent add-on in clinic today for urinary retention.  He is a comorbid 87 year old male on anticoagulation with Eliquis who reports a few months of worsening urinary symptoms with urgency, frequency, weak stream, dribbling, some incontinence, and feeling of incomplete emptying.  He is also having some burning and pelvic pain.  He denies any prior prostate or bladder surgeries.  He denies any history of urinary retention.  He was admitted in January 2022 with diverticulitis.  I personally viewed and interpreted those images that show a 34 g prostate and no hydronephrosis.  Much of the history is obtained from his son today.  Urinalysis with PCP 2 days ago was benign.  PVR 860 mL in clinic today.  DRE today by PCP reportedly showed very enlarged prostate.   PMH: Past Medical History:  Diagnosis Date   Basal cell carcinoma 04/14/2021   left medial clavicle EDC 05/31/2021   Basal cell carcinoma 04/14/2021   nose tip    Basal cell carcinoma (BCC) of skin of nose 02/15/2021   CAD (coronary artery disease)    Diverticulitis 2022   HLD (hyperlipidemia)    HTN (hypertension)    LBBB (left bundle branch block)    Myocardial infarction ([place])    Osteoarthritis    Type II diabetes mellitus ([place])     Surgical History: Past Surgical History:  Procedure Laterality Date   CARDIAC CATHETERIZATION     CATARACT EXTRACTION     COLONOSCOPY     COLONOSCOPY WITH PROPOFOL N/A 08/26/2019   Procedure: COLONOSCOPY WITH PROPOFOL;  Surgeon: [name], MD;  Location: [facility] ENDOSCOPY;  Service: Gastroenterology;  Laterality: N/A;   COLONOSCOPY WITH PROPOFOL N/A 09/22/2019   Procedure: COLONOSCOPY WITH PROPOFOL;  Surgeon: [name], MD;  Location: [facility] ENDOSCOPY;  Service: Gastroenterology;  Laterality: N/A;  pediatric colonoscope needed   CORONARY ARTERY BYPASS GRAFT  2012    EYE SURGERY     INGUINAL HERNIA REPAIR     JOINT REPLACEMENT     knees     Family History: Family History  Problem Relation Age of Onset   Arthritis Mother    Heart disease Mother    Heart disease Other     Social History:  reports that he quit smoking about 13 years ago. His smoking use included cigarettes. He has never used smokeless tobacco. He reports that he does not drink alcohol and does not use drugs.  Physical Exam: BP 138/66   Pulse 70   Ht 5\' 8"  (1.727 m)   Wt 170 lb (77.1 kg)   BMI 25.85 kg/m    Constitutional:  Alert and oriented, No acute distress. Cardiovascular: No clubbing, cyanosis, or edema. Respiratory: Normal respiratory effort, no increased work of breathing. GI: Abdomen is soft, nontender, nondistended, no abdominal masses   Laboratory Data: Reviewed, see HPI  Pertinent Imaging: I have personally viewed and interpreted the CT from January 2022 showing a 30 g prostate.   Nursing unable to place an 48 [place] coud Foley.  Patient was prepped and draped in standard sterile fashion.  On exam, the phallus was widely patent.  A 20 F Coloplast catheter passed easily into the bladder with return of clear yellow urine.  10 cc were placed in the balloon, the catheter was connected to dependent drainage, and the Foley was secured to the thigh.   Assessment & Plan:  87 year old male with urinary retention likely at least over the last few weeks to months with some overflow incontinence.  Catheter was placed today, and he was started on Flomax.  No clinical or laboratory evidence of infection.  Follow-up in 1 to 2 weeks for voiding trial and PVR Continue Flomax  Michael Madrid, MD 08/31/2021  [facility] [address], [place], [place] [address] [phone]

## 2021-08-31 NOTE — Assessment & Plan Note (Signed)
Patient has trouble passing water Prostate examination revealed prostate to be significantly enlarged, I think he is having urinary tract obstruction due to prostatic enlargement He was started on Flomax and Levaquin He was advised to go to the hospital if he develops any urinary retention In the meantime we will make an appointment for him to see a GU specialist, PSA was drawn in this office along with a CBC

## 2021-08-31 NOTE — Assessment & Plan Note (Addendum)
Patient denies any chest pain or shortness of breath there is no history of palpitation or paroxysmal nocturnal dyspnea

## 2021-08-31 NOTE — Assessment & Plan Note (Signed)
We will get a CBC and Medbery

## 2021-08-31 NOTE — Patient Instructions (Signed)
Benign Prostatic Hyperplasia Benign prostatic hyperplasia (BPH) is an enlarged prostate gland that is caused by the normal aging process and not by cancer. The prostate is a walnut-sized gland that is involved in the production of semen. It is located in front of the rectum and below the bladder. The bladder stores urine and the urethra is the tube that carries the urine out of the body. The prostate may get bigger as a man gets older. An enlarged prostate can press on the urethra. This can make it harder to pass urine. The build-up of urine in the bladder can cause infection. Back pressure and infection may progress to bladder damage and kidney (renal) failure. What are the causes? This condition is part of a normal aging process. However, not all men develop problems from this condition. If the prostate enlarges away from the urethra, urine flow will not be blocked. If it enlarges toward the urethra and compresses it, there will be problems passing urine. What increases the risk? This condition is more likely to develop in men over the age of 50 years. What are the signs or symptoms? Symptoms of this condition include: Getting up often during the night to urinate. Needing to urinate frequently during the day. Difficulty starting urine flow. Decrease in size and strength of your urine stream. Leaking (dribbling) after urinating. Inability to pass urine. This needs immediate treatment. Inability to completely empty your bladder. Pain when you pass urine. This is more common if there is also an infection. Urinary tract infection (UTI). How is this diagnosed? This condition is diagnosed based on your medical history, a physical exam, and your symptoms. Tests will also be done, such as: A post-void bladder scan. This measures any amount of urine that may remain in your bladder after you finish urinating. A digital rectal exam. In a rectal exam, your health care provider checks your prostate by  putting a lubricated, gloved finger into your rectum to feel the back of your prostate gland. This exam detects the size of your gland and any abnormal lumps or growths. An exam of your urine (urinalysis). A prostate specific antigen (PSA) screening. This is a blood test used to screen for prostate cancer. An ultrasound. This test uses sound waves to electronically produce a picture of your prostate gland. Your health care provider may refer you to a specialist in kidney and prostate diseases (urologist). How is this treated? Once symptoms begin, your health care provider will monitor your condition (active surveillance or watchful waiting). Treatment for this condition will depend on the severity of your condition. Treatment may include: Observation and yearly exams. This may be the only treatment needed if your condition and symptoms are mild. Medicines to relieve your symptoms, including: Medicines to shrink the prostate. Medicines to relax the muscle of the prostate. Surgery in severe cases. Surgery may include: Prostatectomy. In this procedure, the prostate tissue is removed completely through an open incision or with a laparoscope or robotics. Transurethral resection of the prostate (TURP). In this procedure, a tool is inserted through the opening at the tip of the penis (urethra). It is used to cut away tissue of the inner core of the prostate. The pieces are removed through the same opening of the penis. This removes the blockage. Transurethral incision (TUIP). In this procedure, small cuts are made in the prostate. This lessens the prostate's pressure on the urethra. Transurethral microwave thermotherapy (TUMT). This procedure uses microwaves to create heat. The heat destroys and removes a   small amount of prostate tissue. Transurethral needle ablation (TUNA). This procedure uses radio frequencies to destroy and remove a small amount of prostate tissue. Interstitial laser coagulation (ILC).  This procedure uses a laser to destroy and remove a small amount of prostate tissue. Transurethral electrovaporization (TUVP). This procedure uses electrodes to destroy and remove a small amount of prostate tissue. Prostatic urethral lift. This procedure inserts an implant to push the lobes of the prostate away from the urethra. Follow these instructions at home: Take over-the-counter and prescription medicines only as told by your health care provider. Monitor your symptoms for any changes. Contact your health care provider with any changes. Avoid drinking large amounts of liquid before going to bed or out in public. Avoid or reduce how much caffeine or alcohol you drink. Give yourself time when you urinate. Keep all follow-up visits as told by your health care provider. This is important. Contact a health care provider if: You have unexplained back pain. Your symptoms do not get better with treatment. You develop side effects from the medicine you are taking. Your urine becomes very dark or has a bad smell. Your lower abdomen becomes distended and you have trouble passing your urine. Get help right away if: You have a fever or chills. You suddenly cannot urinate. You feel lightheaded, or very dizzy, or you faint. There are large amounts of blood or clots in the urine. Your urinary problems become hard to manage. You develop moderate to severe low back or flank pain. The flank is the side of your body between the ribs and the hip. These symptoms may represent a serious problem that is an emergency. Do not wait to see if the symptoms will go away. Get medical help right away. Call your local emergency services (911 in the [place] Do not drive yourself to the hospital. Summary Benign prostatic hyperplasia (BPH) is an enlarged prostate that is caused by the normal aging process and not by cancer. An enlarged prostate can press on the urethra. This can make it hard to pass urine. This  condition is part of a normal aging process and is more likely to develop in men over the age of 50 years. Get help right away if you suddenly cannot urinate. This information is not intended to replace advice given to you by your health care provider. Make sure you discuss any questions you have with your health care provider. Document Revised: 03/16/2021 Document Reviewed: 08/12/2020 Elsevier Patient Education  2022 [employer]  

## 2021-08-31 NOTE — Progress Notes (Signed)
Established Patient Office Visit  Subjective:  Patient ID: Michael Simon, male    DOB: 1933/02/11  Age: 87 y.o. MRN: [id number]  CC:  Chief Complaint  Patient presents with   Urinary Tract Infection    Urinary Tract Infection  Patient has trouble passing water prostate was found to be enlarged  [name] presents for check up  Past Medical History:  Diagnosis Date   Basal cell carcinoma 04/14/2021   left medial clavicle EDC 05/31/2021   Basal cell carcinoma 04/14/2021   nose tip    Basal cell carcinoma (BCC) of skin of nose 02/15/2021   CAD (coronary artery disease)    Diverticulitis 2022   HLD (hyperlipidemia)    HTN (hypertension)    LBBB (left bundle branch block)    Myocardial infarction ([place])    Osteoarthritis    Type II diabetes mellitus ([place])     Past Surgical History:  Procedure Laterality Date   CARDIAC CATHETERIZATION     CATARACT EXTRACTION     COLONOSCOPY     COLONOSCOPY WITH PROPOFOL N/A 08/26/2019   Procedure: COLONOSCOPY WITH PROPOFOL;  Surgeon: [name], MD;  Location[facility] ENDOSCOPY;  Service: Gastroenterology;  Laterality: N/A;   COLONOSCOPY WITH PROPOFOL N/A 09/22/2019   Procedure: COLONOSCOPY WITH PROPOFOL;  Surgeon: [name], MD;  Location: [facility] ENDOSCOPY;  Service: Gastroenterology;  Laterality: N/A;  pediatric colonoscope needed   CORONARY ARTERY BYPASS GRAFT  2012   EYE SURGERY     INGUINAL HERNIA REPAIR     JOINT REPLACEMENT     knees    Family History  Problem Relation Age of Onset   Arthritis Mother    Heart disease Mother    Heart disease Other     Social History   Socioeconomic History   Marital status: Widowed    Spouse name: Not on file   Number of children: Not on file   Years of education: Not on file   Highest education level: Not on file  Occupational History   Not on file  Tobacco Use   Smoking status: Former    Types: Cigarettes    Quit date: 03/03/2008    Years since quitting: 13.5    Smokeless tobacco: Never  Vaping Use   Vaping Use: Never used  Substance and Sexual Activity   Alcohol use: No    Alcohol/week: 0.0 standard drinks   Drug use: No   Sexual activity: Not on file  Other Topics Concern   Not on file  Social History Narrative   Not on file   Social Determinants of Health   Financial Resource Strain: Not on file  Food Insecurity: Not on file  Transportation Needs: Not on file  Physical Activity: Not on file  Stress: Not on file  Social Connections: Not on file  Intimate Partner Violence: Not on file     Current Outpatient Medications:    levofloxacin (LEVAQUIN) 500 MG tablet, Take 1 tablet (500 mg total) by mouth daily for 7 days., Disp: 7 tablet, Rfl: 0   tamsulosin (FLOMAX) 0.4 MG CAPS capsule, Take 1 capsule (0.4 mg total) by mouth daily., Disp: 30 capsule, Rfl: 3   albuterol (PROVENTIL) (2.5 MG/3ML) 0.083% nebulizer solution, Take 3 mLs (2.5 mg total) by nebulization every 6 (six) hours as needed for wheezing or shortness of breath., Disp: 150 mL, Rfl: 1   albuterol (VENTOLIN HFA) 108 (90 Base) MCG/ACT inhaler, TAKE 2 PUFFS BY MOUTH EVERY 6 HOURS AS NEEDED FOR  WHEEZE OR SHORTNESS OF BREATH, Disp: 8.5 each, Rfl: 6   amiodarone (PACERONE) 200 MG tablet, Take 1 tablet (200 mg total) by mouth 2 (two) times daily., Disp: 60 tablet, Rfl: 1   apixaban (ELIQUIS) 2.5 MG TABS tablet, Take 1 tablet (2.5 mg total) by mouth 2 (two) times daily., Disp: 60 tablet, Rfl: 2   budesonide-formoterol (SYMBICORT) 80-4.5 MCG/ACT inhaler, Inhale 2 puffs into the lungs 2 (two) times daily., Disp: 1 each, Rfl: 2   Cyanocobalamin 1000 MCG TBCR, Take 1,000 mcg by mouth daily. , Disp: , Rfl:    fluticasone (FLONASE) 50 MCG/ACT nasal spray, Place 2 sprays into both nostrils daily., Disp: 9.9 mL, Rfl: 0   furosemide (LASIX) 20 MG tablet, TAKE 1 TABLET BY MOUTH EVERY DAY, Disp: 90 tablet, Rfl: 1   Insulin Pen Needle (PEN NEEDLES) 33G X 4 MM MISC, Use to administer insulin daily,  Disp: 100 each, Rfl: 6   levofloxacin (LEVAQUIN) 500 MG tablet, TAKE 1 TABLET (500 MG TOTAL) BY MOUTH DAILY., Disp: 7 tablet, Rfl: 0   lisinopril (ZESTRIL) 30 MG tablet, TAKE 1 TABLET BY MOUTH EVERY DAY, Disp: 90 tablet, Rfl: 3   loratadine (CLARITIN) 10 MG tablet, Take 1 tablet (10 mg total) by mouth daily., Disp: 30 tablet, Rfl: 1   lovastatin (MEVACOR) 40 MG tablet, TAKE 1 TABLET BY MOUTH EVERYDAY AT BEDTIME, Disp: 90 tablet, Rfl: 2   mupirocin ointment (BACTROBAN) 2 %, Apply 1 application topically daily. With dressing changes, Disp: 22 g, Rfl: 0   pantoprazole (PROTONIX) 40 MG tablet, TAKE 1 TABLET BY MOUTH EVERY DAY, Disp: 90 tablet, Rfl: 3   tiotropium (SPIRIVA HANDIHALER) 18 MCG inhalation capsule, Place 1 capsule (18 mcg total) into inhaler and inhale daily., Disp: 30 capsule, Rfl: 2   TRESIBA FLEXTOUCH 100 UNIT/ML FlexTouch Pen, INJECT 15 UNITS SUBCUTANEOUSLY DAILY, Disp: 3 mL, Rfl: 4   No Known Allergies  ROS Review of Systems  Constitutional: Negative.   HENT: Negative.    Eyes: Negative.   Respiratory: Negative.    Cardiovascular: Negative.   Gastrointestinal: Negative.   Endocrine: Negative.   Genitourinary: Negative.   Musculoskeletal: Negative.   Skin: Negative.   Allergic/Immunologic: Negative.   Neurological: Negative.   Hematological: Negative.   Psychiatric/Behavioral: Negative.    All other systems reviewed and are negative.    Objective:    Physical Exam Vitals reviewed.  Constitutional:      Appearance: Normal appearance.  HENT:     Mouth/Throat:     Mouth: Mucous membranes are moist.  Eyes:     Pupils: Pupils are equal, round, and reactive to light.  Neck:     Vascular: No carotid bruit.  Cardiovascular:     Rate and Rhythm: Normal rate and regular rhythm.     Pulses: Normal pulses.     Heart sounds: Normal heart sounds.  Pulmonary:     Effort: Pulmonary effort is normal.     Breath sounds: Normal breath sounds.  Abdominal:     General:  Bowel sounds are normal.     Palpations: Abdomen is soft. There is no hepatomegaly, splenomegaly or mass.     Tenderness: There is no abdominal tenderness.     Hernia: No hernia is present.  Genitourinary:    Comments: Prostate is massively enlarged Musculoskeletal:     Cervical back: Neck supple.     Right lower leg: No edema.     Left lower leg: No edema.  Skin:  Findings: No rash.  Neurological:     Mental Status: He is alert and oriented to person, place, and time.     Motor: No weakness.  Psychiatric:        Mood and Affect: Mood normal.        Behavior: Behavior normal.    BP 138/66   Pulse 70   Ht 5\' 9"  (1.753 m)   Wt 171 lb 1.6 oz (77.6 kg)   BMI 25.27 kg/m  Wt Readings from Last 3 Encounters:  08/31/21 171 lb 1.6 oz (77.6 kg)  07/26/21 173 lb (78.5 kg)  06/30/21 168 lb 6.9 oz (76.4 kg)     Health Maintenance Due  Topic Date Due   FOOT EXAM  Never done   OPHTHALMOLOGY EXAM  Never done   TETANUS/TDAP  Never done   Zoster Vaccines- Shingrix (1 of 2) Never done   PNA vac Low Risk Adult (2 of 2 - PCV13) 03/10/2012   COVID-19 Vaccine (3 - Pfizer risk series) 04/08/2020   HEMOGLOBIN A1C  07/02/2021   INFLUENZA VACCINE  07/18/2021    There are no preventive care reminders to display for this patient.  Lab Results  Component Value Date   TSH 3.698 01/02/2021   Lab Results  Component Value Date   WBC 10.2 01/06/2021   HGB 9.9 (L) 01/06/2021   HCT 31.8 (L) 01/06/2021   MCV 98.5 01/06/2021   PLT 168 01/06/2021   Lab Results  Component Value Date   NA 137 01/06/2021   K 5.3 (H) 01/06/2021   CO2 25 01/06/2021   GLUCOSE 160 (H) 01/06/2021   BUN 44 (H) 01/06/2021   CREATININE 2.05 (H) 01/06/2021   BILITOT 0.9 01/02/2021   ALKPHOS 79 01/02/2021   AST 20 01/02/2021   ALT 18 01/02/2021   PROT 8.2 (H) 01/02/2021   ALBUMIN 4.1 01/02/2021   CALCIUM 8.1 (L) 01/06/2021   ANIONGAP 8 01/06/2021   No results found for: CHOL No results found for: HDL No  results found for: LDLCALC No results found for: TRIG No results found for: CHOLHDL Lab Results  Component Value Date   HGBA1C 8.2 (H) 01/02/2021      Assessment & Plan:   Problem List Items Addressed This Visit       Cardiovascular and Mediastinum   Essential hypertension     Patient denies any chest pain or shortness of breath there is no history of palpitation or paroxysmal nocturnal dyspnea       Coronary artery disease involving native heart without angina pectoris    Stable on medication      Atrial flutter ([place])    Stable        Endocrine   Diabetes mellitus ([place])    We will draw a complete metabolic panel which includes glucose        Genitourinary   Prostatitis syndrome - Primary    Patient has trouble passing water Prostate examination revealed prostate to be significantly enlarged, I think he is having urinary tract obstruction due to prostatic enlargement He was started on Flomax and Levaquin He was advised to go to the hospital if he develops any urinary retention In the meantime we will make an appointment for him to see a GU specialist, PSA was drawn in this office along with a CBC      Relevant Medications   tamsulosin (FLOMAX) 0.4 MG CAPS capsule   levofloxacin (LEVAQUIN) 500 MG tablet     Other  Generalized weakness    We will get a CBC and Medbery       Meds ordered this encounter  Medications   tamsulosin (FLOMAX) 0.4 MG CAPS capsule    Sig: Take 1 capsule (0.4 mg total) by mouth daily.    Dispense:  30 capsule    Refill:  3   levofloxacin (LEVAQUIN) 500 MG tablet    Sig: Take 1 tablet (500 mg total) by mouth daily for 7 days.    Dispense:  7 tablet    Refill:  0    Follow-up: No follow-ups on file.    [name], MD

## 2021-08-31 NOTE — Assessment & Plan Note (Signed)
We will draw a complete metabolic panel which includes glucose

## 2021-08-31 NOTE — Assessment & Plan Note (Signed)
Stable

## 2021-09-01 ENCOUNTER — Observation Stay
Admit: 2021-09-01 | Discharge: 2021-09-01 | Disposition: A | Discharge status: Home / Self Care | Payer: Medicare HMO | Attending: Internal Medicine | Admitting: Internal Medicine

## 2021-09-01 ENCOUNTER — Other Ambulatory Visit: Payer: Self-pay

## 2021-09-01 ENCOUNTER — Emergency Department: Payer: Medicare HMO

## 2021-09-01 ENCOUNTER — Inpatient Hospital Stay
Admission: EM | Admit: 2021-09-01 | Discharge: 2021-09-10 | DRG: 669 | Disposition: A | Discharge status: Home Health | Payer: Medicare HMO | Attending: Hospitalist | Admitting: Hospitalist

## 2021-09-01 DIAGNOSIS — R531 Weakness: Secondary | ICD-10-CM | POA: Diagnosis not present

## 2021-09-01 DIAGNOSIS — R0600 Dyspnea, unspecified: Secondary | ICD-10-CM

## 2021-09-01 DIAGNOSIS — N179 Acute kidney failure, unspecified: Secondary | ICD-10-CM | POA: Diagnosis present

## 2021-09-01 DIAGNOSIS — J90 Pleural effusion, not elsewhere classified: Secondary | ICD-10-CM | POA: Diagnosis not present

## 2021-09-01 DIAGNOSIS — N184 Chronic kidney disease, stage 4 (severe): Secondary | ICD-10-CM | POA: Diagnosis not present

## 2021-09-01 DIAGNOSIS — I252 Old myocardial infarction: Secondary | ICD-10-CM

## 2021-09-01 DIAGNOSIS — I5022 Chronic systolic (congestive) heart failure: Secondary | ICD-10-CM | POA: Diagnosis present

## 2021-09-01 DIAGNOSIS — I517 Cardiomegaly: Secondary | ICD-10-CM | POA: Diagnosis not present

## 2021-09-01 DIAGNOSIS — R739 Hyperglycemia, unspecified: Secondary | ICD-10-CM | POA: Diagnosis not present

## 2021-09-01 DIAGNOSIS — Z20822 Contact with and (suspected) exposure to covid-19: Secondary | ICD-10-CM | POA: Diagnosis not present

## 2021-09-01 DIAGNOSIS — I10 Essential (primary) hypertension: Secondary | ICD-10-CM | POA: Diagnosis present

## 2021-09-01 DIAGNOSIS — D62 Acute posthemorrhagic anemia: Secondary | ICD-10-CM | POA: Diagnosis not present

## 2021-09-01 DIAGNOSIS — Z8249 Family history of ischemic heart disease and other diseases of the circulatory system: Secondary | ICD-10-CM | POA: Diagnosis not present

## 2021-09-01 DIAGNOSIS — R310 Gross hematuria: Secondary | ICD-10-CM | POA: Diagnosis not present

## 2021-09-01 DIAGNOSIS — I447 Left bundle-branch block, unspecified: Secondary | ICD-10-CM | POA: Diagnosis present

## 2021-09-01 DIAGNOSIS — E118 Type 2 diabetes mellitus with unspecified complications: Secondary | ICD-10-CM | POA: Diagnosis not present

## 2021-09-01 DIAGNOSIS — Z7901 Long term (current) use of anticoagulants: Secondary | ICD-10-CM | POA: Diagnosis not present

## 2021-09-01 DIAGNOSIS — Z87891 Personal history of nicotine dependence: Secondary | ICD-10-CM

## 2021-09-01 DIAGNOSIS — R0689 Other abnormalities of breathing: Secondary | ICD-10-CM | POA: Diagnosis not present

## 2021-09-01 DIAGNOSIS — I482 Chronic atrial fibrillation, unspecified: Secondary | ICD-10-CM | POA: Diagnosis not present

## 2021-09-01 DIAGNOSIS — N401 Enlarged prostate with lower urinary tract symptoms: Secondary | ICD-10-CM | POA: Diagnosis present

## 2021-09-01 DIAGNOSIS — M25551 Pain in right hip: Secondary | ICD-10-CM | POA: Diagnosis present

## 2021-09-01 DIAGNOSIS — I5031 Acute diastolic (congestive) heart failure: Secondary | ICD-10-CM | POA: Diagnosis not present

## 2021-09-01 DIAGNOSIS — I2510 Atherosclerotic heart disease of native coronary artery without angina pectoris: Secondary | ICD-10-CM | POA: Diagnosis present

## 2021-09-01 DIAGNOSIS — Z79899 Other long term (current) drug therapy: Secondary | ICD-10-CM | POA: Diagnosis not present

## 2021-09-01 DIAGNOSIS — E785 Hyperlipidemia, unspecified: Secondary | ICD-10-CM | POA: Diagnosis present

## 2021-09-01 DIAGNOSIS — E1122 Type 2 diabetes mellitus with diabetic chronic kidney disease: Secondary | ICD-10-CM | POA: Diagnosis present

## 2021-09-01 DIAGNOSIS — D500 Iron deficiency anemia secondary to blood loss (chronic): Secondary | ICD-10-CM | POA: Diagnosis present

## 2021-09-01 DIAGNOSIS — N138 Other obstructive and reflux uropathy: Secondary | ICD-10-CM | POA: Diagnosis present

## 2021-09-01 DIAGNOSIS — R319 Hematuria, unspecified: Secondary | ICD-10-CM

## 2021-09-01 DIAGNOSIS — Z794 Long term (current) use of insulin: Secondary | ICD-10-CM

## 2021-09-01 DIAGNOSIS — Z951 Presence of aortocoronary bypass graft: Secondary | ICD-10-CM | POA: Diagnosis not present

## 2021-09-01 DIAGNOSIS — Z85828 Personal history of other malignant neoplasm of skin: Secondary | ICD-10-CM

## 2021-09-01 DIAGNOSIS — M6281 Muscle weakness (generalized): Secondary | ICD-10-CM | POA: Diagnosis not present

## 2021-09-01 DIAGNOSIS — R338 Other retention of urine: Secondary | ICD-10-CM | POA: Diagnosis present

## 2021-09-01 DIAGNOSIS — N3289 Other specified disorders of bladder: Secondary | ICD-10-CM | POA: Diagnosis not present

## 2021-09-01 DIAGNOSIS — I4892 Unspecified atrial flutter: Secondary | ICD-10-CM | POA: Diagnosis not present

## 2021-09-01 DIAGNOSIS — J449 Chronic obstructive pulmonary disease, unspecified: Secondary | ICD-10-CM | POA: Diagnosis present

## 2021-09-01 DIAGNOSIS — Z7951 Long term (current) use of inhaled steroids: Secondary | ICD-10-CM

## 2021-09-01 DIAGNOSIS — I959 Hypotension, unspecified: Secondary | ICD-10-CM | POA: Diagnosis present

## 2021-09-01 DIAGNOSIS — R0902 Hypoxemia: Secondary | ICD-10-CM | POA: Diagnosis not present

## 2021-09-01 DIAGNOSIS — I5021 Acute systolic (congestive) heart failure: Secondary | ICD-10-CM

## 2021-09-01 DIAGNOSIS — N3001 Acute cystitis with hematuria: Secondary | ICD-10-CM | POA: Diagnosis not present

## 2021-09-01 DIAGNOSIS — I130 Hypertensive heart and chronic kidney disease with heart failure and stage 1 through stage 4 chronic kidney disease, or unspecified chronic kidney disease: Secondary | ICD-10-CM | POA: Diagnosis not present

## 2021-09-01 DIAGNOSIS — R4182 Altered mental status, unspecified: Secondary | ICD-10-CM | POA: Diagnosis not present

## 2021-09-01 DIAGNOSIS — M9908 Segmental and somatic dysfunction of rib cage: Secondary | ICD-10-CM

## 2021-09-01 DIAGNOSIS — T191XXA Foreign body in bladder, initial encounter: Secondary | ICD-10-CM | POA: Diagnosis not present

## 2021-09-01 DIAGNOSIS — R404 Transient alteration of awareness: Secondary | ICD-10-CM | POA: Diagnosis not present

## 2021-09-01 DIAGNOSIS — R52 Pain, unspecified: Secondary | ICD-10-CM | POA: Diagnosis not present

## 2021-09-01 DIAGNOSIS — J9601 Acute respiratory failure with hypoxia: Secondary | ICD-10-CM | POA: Diagnosis not present

## 2021-09-01 DIAGNOSIS — N390 Urinary tract infection, site not specified: Secondary | ICD-10-CM | POA: Diagnosis present

## 2021-09-01 DIAGNOSIS — E119 Type 2 diabetes mellitus without complications: Secondary | ICD-10-CM

## 2021-09-01 HISTORY — DX: Acute systolic (congestive) heart failure: I50.21

## 2021-09-01 LAB — CBC WITH DIFFERENTIAL/PLATELET
Abs Immature Granulocytes: 0.05 10*3/uL (ref 0.00–0.07)
Absolute Monocytes: 744 cells/uL (ref 200–950)
Basophils Absolute: 56 cells/uL (ref 0–200)
Basophils Absolute: 0 10*3/uL (ref 0.0–0.1)
Basophils Relative: 0.6 %
Basophils Relative: 0 %
Eosinophils Absolute: 177 cells/uL (ref 15–500)
Eosinophils Absolute: 0.1 10*3/uL (ref 0.0–0.5)
Eosinophils Relative: 1.9 %
Eosinophils Relative: 1 %
HCT: 32.6 % — ABNORMAL LOW (ref 38.5–50.0)
HCT: 25.4 % — ABNORMAL LOW (ref 39.0–52.0)
Hemoglobin: 10.8 g/dL — ABNORMAL LOW (ref 13.2–17.1)
Hemoglobin: 8.4 g/dL — ABNORMAL LOW (ref 13.0–17.0)
Immature Granulocytes: 1 %
Lymphocytes Relative: 15 %
Lymphs Abs: 1888 cells/uL (ref 850–3900)
Lymphs Abs: 1.4 10*3/uL (ref 0.7–4.0)
MCH: 33.2 pg — ABNORMAL HIGH (ref 27.0–33.0)
MCH: 33.6 pg (ref 26.0–34.0)
MCHC: 33.1 g/dL (ref 32.0–36.0)
MCHC: 33.1 g/dL (ref 30.0–36.0)
MCV: 100.3 fL — ABNORMAL HIGH (ref 80.0–100.0)
MCV: 101.6 fL — ABNORMAL HIGH (ref 80.0–100.0)
MPV: 12 fL (ref 7.5–12.5)
Monocytes Absolute: 0.6 10*3/uL (ref 0.1–1.0)
Monocytes Relative: 8 %
Monocytes Relative: 6 %
Neutro Abs: 6436 cells/uL (ref 1500–7800)
Neutro Abs: 7 10*3/uL (ref 1.7–7.7)
Neutrophils Relative %: 69.2 %
Neutrophils Relative %: 77 %
Platelets: 172 10*3/uL (ref 140–400)
Platelets: 132 10*3/uL — ABNORMAL LOW (ref 150–400)
RBC: 3.25 10*6/uL — ABNORMAL LOW (ref 4.20–5.80)
RBC: 2.5 MIL/uL — ABNORMAL LOW (ref 4.22–5.81)
RDW: 12.6 % (ref 11.0–15.0)
RDW: 13.8 % (ref 11.5–15.5)
Total Lymphocyte: 20.3 %
WBC: 9.3 10*3/uL (ref 3.8–10.8)
WBC: 9.2 10*3/uL (ref 4.0–10.5)
nRBC: 0 % (ref 0.0–0.2)

## 2021-09-01 LAB — COMPLETE METABOLIC PANEL WITH GFR
AG Ratio: 1.5 (calc) (ref 1.0–2.5)
ALT: 16 U/L (ref 9–46)
AST: 24 U/L (ref 10–35)
Albumin: 4.6 g/dL (ref 3.6–5.1)
Alkaline phosphatase (APISO): 163 U/L — ABNORMAL HIGH (ref 35–144)
BUN/Creatinine Ratio: 16 (calc) (ref 6–22)
BUN: 42 mg/dL — ABNORMAL HIGH (ref 7–25)
CO2: 28 mmol/L (ref 20–32)
Calcium: 8.8 mg/dL (ref 8.6–10.3)
Chloride: 101 mmol/L (ref 98–110)
Creat: 2.67 mg/dL — ABNORMAL HIGH (ref 0.70–1.22)
Globulin: 3 g/dL (calc) (ref 1.9–3.7)
Glucose, Bld: 122 mg/dL — ABNORMAL HIGH (ref 65–99)
Potassium: 4.6 mmol/L (ref 3.5–5.3)
Sodium: 141 mmol/L (ref 135–146)
Total Bilirubin: 0.7 mg/dL (ref 0.2–1.2)
Total Protein: 7.6 g/dL (ref 6.1–8.1)
eGFR: 23 mL/min/{1.73_m2} — ABNORMAL LOW (ref >=60)

## 2021-09-01 LAB — ECHOCARDIOGRAM COMPLETE
AR max vel: 1.51 cm2
AV Area VTI: 1.33 cm2
AV Area mean vel: 1.6 cm2
AV Mean grad: 2.3 mmHg
AV Peak grad: 4.2 mmHg
Ao pk vel: 1.02 m/s
Area-P 1/2: 3.2 cm2
Height: 68 in
S' Lateral: 3.3 cm
Weight: 2716.07 oz

## 2021-09-01 LAB — URINALYSIS, ROUTINE W REFLEX MICROSCOPIC
Bacteria, UA: NONE SEEN
RBC / HPF: >50 RBC/hpf — ABNORMAL HIGH (ref 0–5)
Specific Gravity, Urine: 1.02 (ref 1.005–1.030)
Squamous Epithelial / HPF: NONE SEEN (ref 0–5)
WBC, UA: >50 WBC/hpf — ABNORMAL HIGH (ref 0–5)

## 2021-09-01 LAB — PROTIME-INR
INR: 1.5 — ABNORMAL HIGH (ref 0.8–1.2)
Prothrombin Time: 18.1 seconds — ABNORMAL HIGH (ref 11.4–15.2)

## 2021-09-01 LAB — COMPREHENSIVE METABOLIC PANEL
ALT: 13 U/L (ref 0–44)
AST: 25 U/L (ref 15–41)
Albumin: 3.7 g/dL (ref 3.5–5.0)
Alkaline Phosphatase: 111 U/L (ref 38–126)
Anion gap: 11 (ref 5–15)
BUN: 48 mg/dL — ABNORMAL HIGH (ref 8–23)
CO2: 25 mmol/L (ref 22–32)
Calcium: 7.8 mg/dL — ABNORMAL LOW (ref 8.9–10.3)
Chloride: 101 mmol/L (ref 98–111)
Creatinine, Ser: 2.85 mg/dL — ABNORMAL HIGH (ref 0.61–1.24)
GFR, Estimated: 21 mL/min — ABNORMAL LOW (ref >60)
Glucose, Bld: 234 mg/dL — ABNORMAL HIGH (ref 70–99)
Potassium: 4 mmol/L (ref 3.5–5.1)
Sodium: 137 mmol/L (ref 135–145)
Total Bilirubin: 0.7 mg/dL (ref 0.3–1.2)
Total Protein: 6.2 g/dL — ABNORMAL LOW (ref 6.5–8.1)

## 2021-09-01 LAB — CBG MONITORING, ED: Glucose-Capillary: 162 mg/dL — ABNORMAL HIGH (ref 70–99)

## 2021-09-01 LAB — LACTIC ACID, PLASMA
Lactic Acid, Venous: 2.4 mmol/L (ref 0.5–1.9)
Lactic Acid, Venous: 1.3 mmol/L (ref 0.5–1.9)

## 2021-09-01 LAB — TROPONIN I (HIGH SENSITIVITY)
Troponin I (High Sensitivity): 20 ng/L — ABNORMAL HIGH (ref <18)
Troponin I (High Sensitivity): 18 ng/L — ABNORMAL HIGH (ref <18)

## 2021-09-01 LAB — RESP PANEL BY RT-PCR (FLU A&B, COVID) ARPGX2
Influenza A by PCR: NEGATIVE
Influenza B by PCR: NEGATIVE
SARS Coronavirus 2 by RT PCR: NEGATIVE

## 2021-09-01 LAB — HEMOGLOBIN A1C
Hgb A1c MFr Bld: 7.6 % — ABNORMAL HIGH (ref 4.8–5.6)
Mean Plasma Glucose: 171.42 mg/dL

## 2021-09-01 LAB — PSA: PSA: 13.53 ng/mL — ABNORMAL HIGH (ref <=4.00)

## 2021-09-01 LAB — HEMOGLOBIN AND HEMATOCRIT, BLOOD
HCT: 24.6 % — ABNORMAL LOW (ref 39.0–52.0)
Hemoglobin: 8.3 g/dL — ABNORMAL LOW (ref 13.0–17.0)

## 2021-09-01 MED ORDER — ONDANSETRON HCL 4 MG/2ML IJ SOLN
4.0000 mg | Freq: Four times a day (QID) | INTRAMUSCULAR | Status: DC | PRN
  Administered 2021-09-06: 4 mg via INTRAVENOUS

## 2021-09-01 MED ORDER — SODIUM CHLORIDE 0.9 % IV SOLN: INTRAVENOUS | Status: DC

## 2021-09-01 MED ORDER — MUPIROCIN 2 % EX OINT: 1.0000 "application " | TOPICAL_OINTMENT | Freq: Every day | CUTANEOUS | Status: DC

## 2021-09-01 MED ORDER — ONDANSETRON HCL 4 MG PO TABS: 4.0000 mg | ORAL_TABLET | Freq: Four times a day (QID) | ORAL | Status: DC | PRN

## 2021-09-01 MED ORDER — FLUTICASONE PROPIONATE 50 MCG/ACT NA SUSP: 2.0000 | Freq: Every day | NASAL | Status: DC

## 2021-09-01 MED ORDER — SODIUM CHLORIDE 0.9 % IV SOLN
2.0000 g | INTRAVENOUS | Status: DC
  Filled 2021-09-01: qty 20

## 2021-09-01 MED ORDER — ALBUTEROL SULFATE (2.5 MG/3ML) 0.083% IN NEBU: 2.5000 mg | INHALATION_SOLUTION | Freq: Four times a day (QID) | RESPIRATORY_TRACT | Status: DC | PRN

## 2021-09-01 MED ORDER — SODIUM CHLORIDE 0.9 % IV BOLUS
1000.0000 mL | Freq: Once | INTRAVENOUS | Status: AC
  Administered 2021-09-01: 1000 mL via INTRAVENOUS

## 2021-09-01 MED ORDER — SODIUM CHLORIDE 0.9 % IV SOLN
2.0000 g | Freq: Once | INTRAVENOUS | Status: AC
  Administered 2021-09-01: 2 g via INTRAVENOUS
  Filled 2021-09-01: qty 20

## 2021-09-01 MED ORDER — VITAMIN B-12 1000 MCG PO TABS
1000.0000 ug | ORAL_TABLET | Freq: Every day | ORAL | Status: DC
  Administered 2021-09-01 – 2021-09-10 (×9): 1000 ug via ORAL
  Filled 2021-09-01 (×10): qty 1

## 2021-09-01 MED ORDER — PANTOPRAZOLE SODIUM 40 MG PO TBEC
40.0000 mg | DELAYED_RELEASE_TABLET | Freq: Every day | ORAL | Status: DC
  Administered 2021-09-02 – 2021-09-10 (×8): 40 mg via ORAL
  Filled 2021-09-01 (×9): qty 1

## 2021-09-01 MED ORDER — TAMSULOSIN HCL 0.4 MG PO CAPS
0.4000 mg | ORAL_CAPSULE | Freq: Every day | ORAL | Status: DC
  Administered 2021-09-02 – 2021-09-10 (×8): 0.4 mg via ORAL
  Filled 2021-09-01 (×8): qty 1

## 2021-09-01 MED ORDER — PRAVASTATIN SODIUM 20 MG PO TABS
40.0000 mg | ORAL_TABLET | Freq: Every day | ORAL | Status: DC
  Administered 2021-09-01 – 2021-09-09 (×8): 40 mg via ORAL
  Filled 2021-09-01 (×9): qty 2

## 2021-09-01 MED ORDER — AMIODARONE HCL 200 MG PO TABS
200.0000 mg | ORAL_TABLET | Freq: Two times a day (BID) | ORAL | Status: DC
  Administered 2021-09-01 – 2021-09-10 (×15): 200 mg via ORAL
  Filled 2021-09-01 (×17): qty 1

## 2021-09-01 MED ORDER — MOMETASONE FURO-FORMOTEROL FUM 100-5 MCG/ACT IN AERO: 2.0000 | INHALATION_SPRAY | Freq: Two times a day (BID) | RESPIRATORY_TRACT | Status: DC

## 2021-09-01 MED ORDER — INSULIN ASPART 100 UNIT/ML IJ SOLN
0.0000 [IU] | Freq: Three times a day (TID) | INTRAMUSCULAR | Status: DC
  Administered 2021-09-01: 3 [IU] via SUBCUTANEOUS
  Administered 2021-09-02: 2 [IU] via SUBCUTANEOUS
  Administered 2021-09-02 – 2021-09-03 (×2): 3 [IU] via SUBCUTANEOUS
  Administered 2021-09-04: 2 [IU] via SUBCUTANEOUS
  Administered 2021-09-04: 3 [IU] via SUBCUTANEOUS
  Administered 2021-09-04 – 2021-09-05 (×2): 2 [IU] via SUBCUTANEOUS
  Filled 2021-09-01 (×8): qty 1

## 2021-09-01 MED ORDER — LACTATED RINGERS IV BOLUS
1000.0000 mL | Freq: Once | INTRAVENOUS | Status: AC
  Administered 2021-09-01: 1000 mL via INTRAVENOUS

## 2021-09-01 MED ORDER — LORATADINE 10 MG PO TABS: 10.0000 mg | ORAL_TABLET | Freq: Every day | ORAL | Status: DC

## 2021-09-01 NOTE — H&P (Addendum)
History and Physical    Michael Simon IYM:[id number] DOB: 1933/02/20 DOA: 09/01/2021  PCP: [name], MD   Patient coming from: Home  I have personally briefly reviewed patient's old medical records in [place]  Chief Complaint: Weakness/change in mental status  Most of the history was obtained from patient's son at the bedside.  HPI: Michael Simon is a 87 y.o. male with medical history significant for chronic urinary retention status post insertion of Foley catheter on 08/31/21, diabetes mellitus, hypertension, chronic A. fib on anticoagulation therapy who presents to the ER via EMS for evaluation of mental status changes. Patient's son states that he had seen neurology 1 day prior to his admission for symptoms related to chronic urinary retention and had a Foley catheter placed.  He states that the Foley catheter insertion was difficult and multiple attempts were made prior to getting it in.  He states that patient had blood in the bag after the procedure and continued bleeding even after they went home. On the morning of his admission patient was found unresponsive in his chair and so EMS was called.  He was hypotensive with blood pressure of 60/30 and received IV fluids via EMS.  At the time of my evaluation he is awake and alert blood pressure has improved. He denies having any abdominal pain, no fever, no chills, no changes in his bowel habits, no melena stools, no hematochezia, no headache, no leg swelling, no blurred vision, no palpitations, no diaphoresis, no difficulty swallowing, no cough. Labs show sodium 137, potassium 4.0, chloride 101, bicarb 25, glucose 234, BUN 48, creatinine 2.85, calcium 7.8, alkaline phosphatase 111, albumin 3.7, AST 25, ALT 346.2, total bilirubin 0.7, troponin 20, lactic acid 2.4, white count 9.2, hemoglobin 8.4 down from 10.8 on 09/14, hematocrit 25.4, MCV 101, RDW 13.8, platelet count 132 Urine analysis shows some pyuria Chest x-ray  reviewed by me shows cardiomegaly with pulmonary vascular congestion     ED Course: Patient is an 87 year old male who presents to the ER via EMS for evaluation of change in mental status.  Patient was said to have developed hematuria following Foley catheter insertion and in the field had a blood pressure of 60/30. He responded to IV fluid resuscitation and is now awake and alert. He has a 2.4 g drop in his H&H over the last 24 hours and will be referred to  observation status for further evaluation.    Review of Systems: As per HPI otherwise all other systems reviewed and negative.    Past Medical History:  Diagnosis Date   Basal cell carcinoma 04/14/2021   left medial clavicle EDC 05/31/2021   Basal cell carcinoma 04/14/2021   nose tip    Basal cell carcinoma (BCC) of skin of nose 02/15/2021   CAD (coronary artery disease)    Diverticulitis 2022   HLD (hyperlipidemia)    HTN (hypertension)    LBBB (left bundle branch block)    Myocardial infarction ([place])    Osteoarthritis    Type II diabetes mellitus ([place])     Past Surgical History:  Procedure Laterality Date   CARDIAC CATHETERIZATION     CATARACT EXTRACTION     COLONOSCOPY     COLONOSCOPY WITH PROPOFOL N/A 08/26/2019   Procedure: COLONOSCOPY WITH PROPOFOL;  Surgeon: [name], MD;  Location: [facility] ENDOSCOPY;  Service: Gastroenterology;  Laterality: N/A;   COLONOSCOPY WITH PROPOFOL N/A 09/22/2019   Procedure: COLONOSCOPY WITH PROPOFOL;  Surgeon: [name], MD;  Location: [facility]  ENDOSCOPY;  Service: Gastroenterology;  Laterality: N/A;  pediatric colonoscope needed   CORONARY ARTERY BYPASS GRAFT  2012   EYE SURGERY     INGUINAL HERNIA REPAIR     JOINT REPLACEMENT     knees     reports that he quit smoking about 13 years ago. His smoking use included cigarettes. He has never used smokeless tobacco. He reports that he does not drink alcohol and does not use drugs.  No Known Allergies  Family History  Problem Relation  Age of Onset   Arthritis Mother    Heart disease Mother    Heart disease Other       Prior to Admission medications   Medication Sig Start Date End Date Taking? Authorizing Provider  albuterol (PROVENTIL) (2.5 MG/3ML) 0.083% nebulizer solution Take 3 mLs (2.5 mg total) by nebulization every 6 (six) hours as needed for wheezing or shortness of breath. 02/15/21   [name], MD  albuterol (VENTOLIN HFA) 108 (90 Base) MCG/ACT inhaler TAKE 2 PUFFS BY MOUTH EVERY 6 HOURS AS NEEDED FOR WHEEZE OR SHORTNESS OF BREATH 02/02/21   [name], MD  amiodarone (PACERONE) 200 MG tablet Take 1 tablet (200 mg total) by mouth 2 (two) times daily. 01/06/21   [name], MD  apixaban (ELIQUIS) 2.5 MG TABS tablet Take 1 tablet (2.5 mg total) by mouth 2 (two) times daily. 01/06/21   [name], MD  budesonide-formoterol [facility]) 80-4.5 MCG/ACT inhaler Inhale 2 puffs into the lungs 2 (two) times daily. 01/06/21   [name], MD  Cyanocobalamin 1000 MCG TBCR Take 1,000 mcg by mouth daily.     [provider]  fluticasone (FLONASE) 50 MCG/ACT nasal spray Place 2 sprays into both nostrils daily. 01/07/21   [name], MD  furosemide (LASIX) 20 MG tablet TAKE 1 TABLET BY MOUTH EVERY DAY 03/02/21   [name], MD  Insulin Pen Needle (PEN NEEDLES) 33G X 4 MM MISC Use to administer insulin daily 01/11/21   [name], MD  levofloxacin (LEVAQUIN) 500 MG tablet Take 1 tablet (500 mg total) by mouth daily for 7 days. 08/31/21 09/07/21  [name], MD  lisinopril (ZESTRIL) 30 MG tablet TAKE 1 TABLET BY MOUTH EVERY DAY 05/24/21   [name], MD  loratadine (CLARITIN) 10 MG tablet Take 1 tablet (10 mg total) by mouth daily. 01/07/21   [name], MD  lovastatin (MEVACOR) 40 MG tablet TAKE 1 TABLET BY MOUTH EVERYDAY AT BEDTIME 02/16/21   [name], MD  mupirocin ointment (BACTROBAN) 2 % Apply 1 application topically daily. With dressing changes 05/31/21   [name],  MD  pantoprazole (PROTONIX) 40 MG tablet TAKE 1 TABLET BY MOUTH EVERY DAY 08/08/21   [name], MD  tamsulosin (FLOMAX) 0.4 MG CAPS capsule Take 1 capsule (0.4 mg total) by mouth daily. 08/31/21   [name], MD  tiotropium (SPIRIVA HANDIHALER) 18 MCG inhalation capsule Place 1 capsule (18 mcg total) into inhaler and inhale daily. 01/06/21   [name], MD  TRESIBA FLEXTOUCH 100 UNIT/ML FlexTouch Pen INJECT 15 UNITS SUBCUTANEOUSLY DAILY 02/21/21   [name], MD    Physical Exam: Vitals:   09/01/21 1018 09/01/21 1030 09/01/21 1045 09/01/21 1100  BP:  105/60 96/72 124/67  Pulse:    71  Resp:  14 14 16   Temp: (!) 97.5 F (36.4 C)     TempSrc: Oral     SpO2:    100%  Weight:  Height:         Vitals:   09/01/21 1018 09/01/21 1030 09/01/21 1045 09/01/21 1100  BP:  105/60 96/72 124/67  Pulse:    71  Resp:  14 14 16   Temp: (!) 97.5 F (36.4 C)     TempSrc: Oral     SpO2:    100%  Weight:      Height:          Constitutional: Alert and oriented x 3 . Not in any apparent distress.  Appears weak and frail HEENT:      Head: Normocephalic and atraumatic.         Eyes: PERLA, EOMI, Conjunctivae pallor. Sclera is non-icteric.       Mouth/Throat: Mucous membranes are moist.       Neck: Supple with no signs of meningismus. Cardiovascular: Regular rate and rhythm. No murmurs, gallops, or rubs. 2+ symmetrical distal pulses are present . No JVD. No LE edema Respiratory: Respiratory effort normal .Lungs sounds clear bilaterally. No wheezes, crackles, or rhonchi.  Gastrointestinal: Soft, non tender, and non distended with positive bowel sounds.  Genitourinary: No CVA tenderness.  Foley catheter in place with gross hematuria Musculoskeletal: Nontender with normal range of motion in all extremities. No cyanosis, or erythema of extremities. Neurologic:  Face is symmetric. Moving all extremities. No gross focal neurologic deficits .  Generalized weakness Skin: Skin is warm,  dry.  No rash or ulcers Psychiatric: Mood and affect are normal    Labs on Admission: I have personally reviewed following labs and imaging studies  CBC: Recent Labs  Lab 08/31/21 1322 09/01/21 1004  WBC 9.3 9.2  NEUTROABS 6,436 7.0  HGB 10.8* 8.4*  HCT 32.6* 25.4*  MCV 100.3* 101.6*  PLT 172 841*   Basic Metabolic Panel: Recent Labs  Lab 08/31/21 1322 09/01/21 1004  NA 141 137  K 4.6 4.0  CL 101 101  CO2 28 25  GLUCOSE 122* 234*  BUN 42* 48*  CREATININE 2.67* 2.85*  CALCIUM 8.8 7.8*   GFR: Estimated Creatinine Clearance: 18 mL/min (A) (by C-G formula based on SCr of 2.85 mg/dL (H)). Liver Function Tests: Recent Labs  Lab 08/31/21 1322 09/01/21 1004  AST 24 25  ALT 16 13  ALKPHOS  --  111  BILITOT 0.7 0.7  PROT 7.6 6.2*  ALBUMIN  --  3.7   No results for input(s): LIPASE, AMYLASE in the last 168 hours. No results for input(s): AMMONIA in the last 168 hours. Coagulation Profile: Recent Labs  Lab 09/01/21 1004  INR 1.5*   Cardiac Enzymes: No results for input(s): CKTOTAL, CKMB, CKMBINDEX, TROPONINI in the last 168 hours. BNP (last 3 results) No results for input(s): PROBNP in the last 8760 hours. HbA1C: No results for input(s): HGBA1C in the last 72 hours. CBG: No results for input(s): GLUCAP in the last 168 hours. Lipid Profile: No results for input(s): CHOL, HDL, LDLCALC, TRIG, CHOLHDL, LDLDIRECT in the last 72 hours. Thyroid Function Tests: No results for input(s): TSH, T4TOTAL, FREET4, T3FREE, THYROIDAB in the last 72 hours. Anemia Panel: No results for input(s): VITAMINB12, FOLATE, FERRITIN, TIBC, IRON, RETICCTPCT in the last 72 hours. Urine analysis:    Component Value Date/Time   COLORURINE RED (A) 09/01/2021 1004   APPEARANCEUR TURBID (A) 09/01/2021 1004   LABSPEC 1.020 09/01/2021 1004   PHURINE  09/01/2021 1004    TEST NOT REPORTED DUE TO COLOR INTERFERENCE OF URINE PIGMENT   GLUCOSEU (A) 09/01/2021 1004  TEST NOT REPORTED DUE TO  COLOR INTERFERENCE OF URINE PIGMENT   HGBUR (A) 09/01/2021 1004    TEST NOT REPORTED DUE TO COLOR INTERFERENCE OF URINE PIGMENT   BILIRUBINUR (A) 09/01/2021 1004    TEST NOT REPORTED DUE TO COLOR INTERFERENCE OF URINE PIGMENT   BILIRUBINUR Negative 08/29/2021 1303   KETONESUR (A) 09/01/2021 1004    TEST NOT REPORTED DUE TO COLOR INTERFERENCE OF URINE PIGMENT   PROTEINUR (A) 09/01/2021 1004    TEST NOT REPORTED DUE TO COLOR INTERFERENCE OF URINE PIGMENT   UROBILINOGEN 0.2 08/29/2021 1303   NITRITE (A) 09/01/2021 1004    TEST NOT REPORTED DUE TO COLOR INTERFERENCE OF URINE PIGMENT   LEUKOCYTESUR (A) 09/01/2021 1004    TEST NOT REPORTED DUE TO COLOR INTERFERENCE OF URINE PIGMENT    Radiological Exams on Admission: DG Chest Portable 1 View  Result Date: 09/01/2021 CLINICAL DATA:  Weakness EXAM: PORTABLE CHEST 1 VIEW COMPARISON:  01/05/2021 FINDINGS: Prior CABG. Cardiomegaly, vascular congestion. No confluent opacities or effusions. No acute bony abnormality. IMPRESSION: Cardiomegaly, vascular congestion. Electronically Signed   By: Rolm Baptise M.D.   On: 09/01/2021 10:17     Assessment/Plan Principal Problem:   Acute blood loss anemia Active Problems:   Diabetes mellitus (HCC)   Essential hypertension   Generalized weakness   Atrial flutter (HCC)   CKD stage 4 due to type 2 diabetes mellitus (HCC)   Hematuria, gross   Acute lower UTI      Acute blood loss anemia Secondary to gross hematuria Patient has had a drop in his hemoglobin over the last 24 hours from 10.8 >> 8.4 Will monitor serial H&H and transfuse as needed Will order CBI Hold apixaban for now   Urinary retention requiring chronic indwelling Foley catheter Patient had a Foley catheter placed on 08/31/21 due to chronic urinary retention Follow-up with urology as an outpatient Continue Flomax   History of atrial flutter On chronic anticoagulation with apixaban Hold Eliquis for now due to gross  hematuria Continue amiodarone for rate control     Diabetes mellitus with complications of stage IV chronic kidney disease Maintain consistent carbohydrate diet Glycemic control with sliding scale insulin     UTI Patient has pyuria Will treat empirically with Rocephin 2 g IV daily Hold Levaquin that was started as an outpatient     Hypertension/CHF Hold furosemide and lisinopril due to hypotension.    DVT prophylaxis: SCD Code Status: full code  Family Communication: Greater than 50% of time was spent discussing patient's condition and plan of care with his son at the bedside.  All questions and concerns have been addressed.  He verbalized understanding and agreed with the plan.  CODE STATUS was discussed and he is a full code. Disposition Plan: Back to previous home environment Consults called: urology Status: Observation    Levell Tavano MD [facility]     09/01/2021, 12:18 PM

## 2021-09-01 NOTE — ED Triage Notes (Signed)
Pt arrives via EMS from home- pt was unresponsive when EMS arrived- pt was also hypotensive at 60/30 initially- pt was seen here yesterday and had a foley placed by urology- pt foley has 280ml of blood in bag- pt more awake now and able to answer Dr Ellender Hose questions

## 2021-09-01 NOTE — ED Notes (Signed)
Blood cultures collected prior to ceftriaxone administration

## 2021-09-01 NOTE — Consult Note (Signed)
CODE SEPSIS - PHARMACY COMMUNICATION  **Broad Spectrum Antibiotics should be administered within 1 hour of Sepsis diagnosis**  Time Code Sepsis Called/Page Received: 1048  Antibiotics Ordered: 1045  Time of 1st antibiotic administration: 1107  Additional action taken by pharmacy: None  If necessary, Name of Provider/Nurse Contacted: [place], PharmD Pharmacy Resident  09/01/2021 10:49 AM

## 2021-09-01 NOTE — Sepsis Progress Note (Signed)
eLink is monitoring this Code Sepsis. °

## 2021-09-01 NOTE — ED Notes (Signed)
Pt sitting on side of bed eating lunch.

## 2021-09-01 NOTE — ED Notes (Signed)
ECHO at bedside.

## 2021-09-01 NOTE — ED Notes (Signed)
Patient at majority of dinner- son at bedside and assisted with feeding

## 2021-09-01 NOTE — Consult Note (Signed)
Urology Consult  I have been asked to see the patient by Dr. Francine Graven, for evaluation and management of gross hematuria.  Chief Complaint: Gross hematuria, AMS  History of Present Illness: Michael Simon is a 87 y.o. year old male on anticoagulation with Eliquis seen in our clinic yesterday by Dr. Diamantina Providence and found to have urinary retention requiring Foley catheter placement who was brought to the ED this morning after developing gross hematuria and AMS at home. Upon EMS arrival, he was found to be hypotensive to 60/30.  Admission vitals notable for hypotension of 104/53 and bradycardia of 54. He has been afebrile. Admission labs notable for WBC count 9.2; Hgb 8.4 (10.8 yesterday); creatinine 2.85 (baseline 2.1); lactate 2.4; and UA with red color, >50 RBCs/hpf, and >50 WBCs/hpf. Urine culture is pending. Code sepsis has been initiated; on antibiotics as below.  Per Dr. Doristine Counter note yesterday, patient reported a few months of worsening urinary symptoms including urgency, frequency, weak stream, dribbling, incontinence, and the sensation of incomplete emptying.  His PVR in clinic was 860 mL prompting Foley catheter placement.  Dr. Diamantina Providence was able to place a 20 [place] silicone catheter into the bladder with no evidence of gross hematuria at the time.  Foley catheter in place today draining red urine with small clots visualized in the tubing. Patient is not able to contribute significantly to HPI today.  He states he developed gross hematuria at some point yesterday.  He denies discomfort.  Anti-infectives (From admission, onward)    Start     Dose/Rate Route Frequency Ordered Stop   09/02/21 1100  cefTRIAXone (ROCEPHIN) 2 g in sodium chloride 0.9 % 100 mL IVPB        2 g 200 mL/hr over 30 Minutes Intravenous Every 24 hours 09/01/21 1224     09/01/21 1045  cefTRIAXone (ROCEPHIN) 2 g in sodium chloride 0.9 % 100 mL IVPB        2 g 200 mL/hr over 30 Minutes Intravenous  Once 09/01/21 1044  09/01/21 1138       Past Medical History:  Diagnosis Date   Basal cell carcinoma 04/14/2021   left medial clavicle EDC 05/31/2021   Basal cell carcinoma 04/14/2021   nose tip    Basal cell carcinoma (BCC) of skin of nose 02/15/2021   CAD (coronary artery disease)    Diverticulitis 2022   HLD (hyperlipidemia)    HTN (hypertension)    LBBB (left bundle branch block)    Myocardial infarction ([place])    Osteoarthritis    Type II diabetes mellitus ([place])     Past Surgical History:  Procedure Laterality Date   CARDIAC CATHETERIZATION     CATARACT EXTRACTION     COLONOSCOPY     COLONOSCOPY WITH PROPOFOL N/A 08/26/2019   Procedure: COLONOSCOPY WITH PROPOFOL;  Surgeon: [name], MD;  Location: [facility] ENDOSCOPY;  Service: Gastroenterology;  Laterality: N/A;   COLONOSCOPY WITH PROPOFOL N/A 09/22/2019   Procedure: COLONOSCOPY WITH PROPOFOL;  Surgeon: [name], MD;  Location: [facility] ENDOSCOPY;  Service: Gastroenterology;  Laterality: N/A;  pediatric colonoscope needed   CORONARY ARTERY BYPASS GRAFT  2012   EYE SURGERY     INGUINAL HERNIA REPAIR     JOINT REPLACEMENT     knees    Home Medications:  No outpatient medications have been marked as taking for the 09/01/21 encounter [facility] Encounter).    Allergies: No Known Allergies  Family History  Problem Relation Age of Onset   Arthritis Mother  Heart disease Mother    Heart disease Other     Social History:  reports that he quit smoking about 13 years ago. His smoking use included cigarettes. He has never used smokeless tobacco. He reports that he does not drink alcohol and does not use drugs.  ROS: A complete review of systems was performed.  All systems are negative except for pertinent findings as noted.  Physical Exam:  Vital signs in last 24 hours: Temp:  [97.5 F (36.4 C)] 97.5 F (36.4 C) (09/15 1018) Pulse Rate:  [54-119] 119 (09/15 1245) Resp:  [12-19] 18 (09/15 1200) BP: (96-138)/(53-72) 103/57 (09/15  1245) SpO2:  [86 %-100 %] 86 % (09/15 1245) Weight:  [77 kg-77.1 kg] 77 kg (09/15 1001) Constitutional:  Alert, no acute distress HEENT: [place] AT, moist mucus membranes Cardiovascular: No clubbing, cyanosis, or edema Respiratory: Normal respiratory effort Skin: No rashes, bruises or suspicious lesions Neurologic: Grossly intact, no focal deficits, moving all 4 extremities Psychiatric: Normal mood and affect  Laboratory Data:  Recent Labs    08/31/21 1322 09/01/21 1004  WBC 9.3 9.2  HGB 10.8* 8.4*  HCT 32.6* 25.4*   Recent Labs    08/31/21 1322 09/01/21 1004  NA 141 137  K 4.6 4.0  CL 101 101  CO2 28 25  GLUCOSE 122* 234*  BUN 42* 48*  CREATININE 2.67* 2.85*  CALCIUM 8.8 7.8*   Recent Labs    09/01/21 1004  INR 1.5*   Urinalysis    Component Value Date/Time   COLORURINE RED (A) 09/01/2021 1004   APPEARANCEUR TURBID (A) 09/01/2021 1004   LABSPEC 1.020 09/01/2021 1004   PHURINE  09/01/2021 1004    TEST NOT REPORTED DUE TO COLOR INTERFERENCE OF URINE PIGMENT   GLUCOSEU (A) 09/01/2021 1004    TEST NOT REPORTED DUE TO COLOR INTERFERENCE OF URINE PIGMENT   HGBUR (A) 09/01/2021 1004    TEST NOT REPORTED DUE TO COLOR INTERFERENCE OF URINE PIGMENT   BILIRUBINUR (A) 09/01/2021 1004    TEST NOT REPORTED DUE TO COLOR INTERFERENCE OF URINE PIGMENT   BILIRUBINUR Negative 08/29/2021 1303   KETONESUR (A) 09/01/2021 1004    TEST NOT REPORTED DUE TO COLOR INTERFERENCE OF URINE PIGMENT   PROTEINUR (A) 09/01/2021 1004    TEST NOT REPORTED DUE TO COLOR INTERFERENCE OF URINE PIGMENT   UROBILINOGEN 0.2 08/29/2021 1303   NITRITE (A) 09/01/2021 1004    TEST NOT REPORTED DUE TO COLOR INTERFERENCE OF URINE PIGMENT   LEUKOCYTESUR (A) 09/01/2021 1004    TEST NOT REPORTED DUE TO COLOR INTERFERENCE OF URINE PIGMENT   Results for orders placed or performed during the hospital encounter of 01/02/21  SARS CORONAVIRUS 2 (TAT 6-24 HRS) Nasopharyngeal Nasopharyngeal Swab     Status: None    Collection Time: 01/02/21  8:51 PM   Specimen: Nasopharyngeal Swab  Result Value Ref Range Status   SARS Coronavirus 2 NEGATIVE NEGATIVE Final    Comment: (NOTE) SARS-CoV-2 target nucleic acids are NOT DETECTED.  The SARS-CoV-2 RNA is generally detectable in upper and lower respiratory specimens during the acute phase of infection. Negative results do not preclude SARS-CoV-2 infection, do not rule out co-infections with other pathogens, and should not be used as the sole basis for treatment or other patient management decisions. Negative results must be combined with clinical observations, patient history, and epidemiological information. The expected result is Negative.  Fact Sheet for Patients: SugarRoll.be  Fact Sheet for Healthcare Providers: https://www.woods-mathews.com/  This test is  not yet approved or cleared by the [place] and  has been authorized for detection and/or diagnosis of SARS-CoV-2 by FDA under an Emergency Use Authorization (EUA). This EUA will remain  in effect (meaning this test can be used) for the duration of the COVID-19 declaration under Se ction 564(b)(1) of the Act, 21 U.S.C. section 360bbb-3(b)(1), unless the authorization is terminated or revoked sooner.  Performed at [facility], [place] [address] [place], [place] [address]   Urine culture     Status: None   Collection Time: 01/03/21  9:19 AM   Specimen: Urine, Random  Result Value Ref Range Status   Specimen Description   Final    URINE, RANDOM Performed at [facility], [address], [place], [place] [address]    Special Requests   Final    NONE Performed at [facility], [address], [place], [place] [address]    Culture   Final    NO GROWTH Performed at [facility], [place] [address] [place], [place] [address]    Report Status 01/05/2021 FINAL  Final   Assessment & Plan:  87 year old comorbid  male on Eliquis for anticoagulation with a recent history of urinary retention and overflow incontinence requiring Foley catheter placement in clinic with Dr. Diamantina Providence yesterday who subsequently developed gross hematuria and was brought to the ED this morning with altered mental status and hypotension with concerns for urosepsis.  Using clean technique, I irrigated the patient's Foley catheter at the bedside today.  I instilled 500 cc of sterile water into the patient's bladder via the Foley catheter in place and was able to evacuate approximately 3 cc of clot material.  The catheter irrigated easily and urine rapidly cleared to pink.  Patient tolerated well.  Hypotension possibly secondary to postobstructive diuresis in the setting of urinary retention with bladder decompression yesterday.  Agree with empiric antibiotics and following cultures.  With rapid improvement and gross hematuria upon manual irrigation at the bedside today, I do not think he requires CBI.  Recommend ongoing manual irrigation as needed if he starts passing clots or his catheter stops draining.  I have placed orders for this today.  Recommendations: -Hold Eliquis to allow urine to clear -Continue empiric antibiotics and follow urine cultures -Manually irrigate Foley catheter as needed, see above -Outpatient voiding trial in 1 to 2 weeks, already scheduled  Thank you for involving me in this patient's care, I will continue to follow along.  Debroah Loop, PA-C 09/01/2021 12:56 PM

## 2021-09-01 NOTE — Progress Notes (Signed)
*  PRELIMINARY RESULTS* Echocardiogram 2D Echocardiogram has been performed.  Sherrie Sport 09/01/2021, 1:37 PM

## 2021-09-01 NOTE — ED Provider Notes (Signed)
[facility] Emergency Department Provider Note  ____________________________________________   Event Date/Time   First MD Initiated Contact with Patient 09/01/21 [phone]     (approximate)  I have reviewed the triage vital signs and the nursing notes.   HISTORY  Chief Complaint Hypotension and Altered Mental Status    HPI Michael Simon is a 87 y.o. male with past medical history as below here with generalized weakness.  The patient was just seen and had a traumatic Foley placement requiring urology referral.  He had acute urinary retention which seems to be a recurring issue for him.  He reportedly felt fine and went home.  He reportedly was feeling generally weak this morning when he woke up.  He told family that he was not feeling well.  He then became less responsive and was unable to get up due to weakness.  On EMS arrival, the patient was disoriented, minimally responsive, with blood pressures in the 82N systolic.  He has been given fluids and laid down, and has since become increasingly awake.  On my assessment, the patient states he feels somewhat weak but otherwise denies complaints.  Denies any chest pain shortness of breath.  Denies any abdominal pain.  He has had persistently bloody urine output since the catheter was placed.  Denies any flank pain.  No other complaints.  Remainder of history limited due to mild confusion.    Past Medical History:  Diagnosis Date   Basal cell carcinoma 04/14/2021   left medial clavicle EDC 05/31/2021   Basal cell carcinoma 04/14/2021   nose tip    Basal cell carcinoma (BCC) of skin of nose 02/15/2021   CAD (coronary artery disease)    Diverticulitis 2022   HLD (hyperlipidemia)    HTN (hypertension)    LBBB (left bundle branch block)    Myocardial infarction ([place])    Osteoarthritis    Type II diabetes mellitus [facility])     Patient Active Problem List   Diagnosis Date Noted   Acute blood loss anemia  09/01/2021   Prostatitis syndrome 08/31/2021   Urgency of urination 08/22/2021   Basal cell carcinoma (BCC) of skin of nose 02/15/2021   Dyspnea 01/11/2021   Diverticulitis    Hypoxia    Acute respiratory failure with hypoxia ([place]) 01/05/2021   Anemia 01/05/2021   AKI (acute kidney injury) ([place]) 01/05/2021   Acute diverticulitis 01/04/2021   Atrial flutter ([place]) 01/02/2021   Bronchitis 12/21/2020   Suspected COVID-19 virus infection 12/21/2020   Annual physical exam 12/07/2020   Paronychia of great toe, right 04/23/2020   Fever 05/30/2018   Weakness 02/02/2018   Abscess of right earlobe 02/02/2018   Generalized weakness 02/02/2018   Hematochezia 07/26/2015   Diabetes mellitus ([place]) 07/26/2015   Essential hypertension 07/26/2015   HLD (hyperlipidemia) 07/26/2015   Coronary artery disease involving native heart without angina pectoris 07/26/2015    Past Surgical History:  Procedure Laterality Date   CARDIAC CATHETERIZATION     CATARACT EXTRACTION     COLONOSCOPY     COLONOSCOPY WITH PROPOFOL N/A 08/26/2019   Procedure: COLONOSCOPY WITH PROPOFOL;  Surgeon: [name], MD;  [facility] ENDOSCOPY;  Service: Gastroenterology;  Laterality: N/A;   COLONOSCOPY WITH PROPOFOL N/A 09/22/2019   Procedure: COLONOSCOPY WITH PROPOFOL;  Surgeon: [name], MD;  Location: [facility] ENDOSCOPY;  Service: Gastroenterology;  Laterality: N/A;  pediatric colonoscope needed   CORONARY ARTERY BYPASS GRAFT  2012   EYE SURGERY     INGUINAL  HERNIA REPAIR     JOINT REPLACEMENT     knees    Prior to Admission medications   Medication Sig Start Date End Date Taking? Authorizing Provider  albuterol (PROVENTIL) (2.5 MG/3ML) 0.083% nebulizer solution Take 3 mLs (2.5 mg total) by nebulization every 6 (six) hours as needed for wheezing or shortness of breath. 02/15/21   [name], MD  albuterol (VENTOLIN HFA) 108 (90 Base) MCG/ACT inhaler TAKE 2 PUFFS BY MOUTH EVERY 6 HOURS AS NEEDED FOR WHEEZE OR SHORTNESS  OF BREATH 02/02/21   [name], MD  amiodarone (PACERONE) 200 MG tablet Take 1 tablet (200 mg total) by mouth 2 (two) times daily. 01/06/21   [name], MD  apixaban (ELIQUIS) 2.5 MG TABS tablet Take 1 tablet (2.5 mg total) by mouth 2 (two) times daily. 01/06/21   [name], MD  budesonide-formoterol [facility]) 80-4.5 MCG/ACT inhaler Inhale 2 puffs into the lungs 2 (two) times daily. 01/06/21   [name], MD  Cyanocobalamin 1000 MCG TBCR Take 1,000 mcg by mouth daily.     [provider]  fluticasone (FLONASE) 50 MCG/ACT nasal spray Place 2 sprays into both nostrils daily. 01/07/21   [name], MD  furosemide (LASIX) 20 MG tablet TAKE 1 TABLET BY MOUTH EVERY DAY 03/02/21   [name], MD  Insulin Pen Needle (PEN NEEDLES) 33G X 4 MM MISC Use to administer insulin daily 01/11/21   [name], MD  levofloxacin (LEVAQUIN) 500 MG tablet Take 1 tablet (500 mg total) by mouth daily for 7 days. 08/31/21 09/07/21  [name], MD  lisinopril (ZESTRIL) 30 MG tablet TAKE 1 TABLET BY MOUTH EVERY DAY 05/24/21   [name], MD  loratadine (CLARITIN) 10 MG tablet Take 1 tablet (10 mg total) by mouth daily. 01/07/21   [name], MD  lovastatin (MEVACOR) 40 MG tablet TAKE 1 TABLET BY MOUTH EVERYDAY AT BEDTIME 02/16/21   [name], MD  mupirocin ointment (BACTROBAN) 2 % Apply 1 application topically daily. With dressing changes 05/31/21   [name], MD  pantoprazole (PROTONIX) 40 MG tablet TAKE 1 TABLET BY MOUTH EVERY DAY 08/08/21   [name], MD  tamsulosin (FLOMAX) 0.4 MG CAPS capsule Take 1 capsule (0.4 mg total) by mouth daily. 08/31/21   [name], MD  tiotropium (SPIRIVA HANDIHALER) 18 MCG inhalation capsule Place 1 capsule (18 mcg total) into inhaler and inhale daily. 01/06/21   [name], MD  TRESIBA FLEXTOUCH 100 UNIT/ML FlexTouch Pen INJECT 15 UNITS SUBCUTANEOUSLY DAILY 02/21/21   [name], MD    Allergies Patient has no  known allergies.  Family History  Problem Relation Age of Onset   Arthritis Mother    Heart disease Mother    Heart disease Other     Social History Social History   Tobacco Use   Smoking status: Former    Types: Cigarettes    Quit date: 03/03/2008    Years since quitting: 13.5   Smokeless tobacco: Never  Vaping Use   Vaping Use: Never used  Substance Use Topics   Alcohol use: No    Alcohol/week: 0.0 standard drinks   Drug use: No    Review of Systems  Review of Systems   ____________________________________________  PHYSICAL EXAM:      VITAL SIGNS: ED Triage Vitals  Enc Vitals Group     BP 09/01/21 1000 (!) 104/53     Pulse Rate 09/01/21 1000 (!) 54  Resp 09/01/21 1000 18     Temp --      Temp src --      SpO2 09/01/21 1000 97 %     Weight 09/01/21 1001 169 lb 12.1 oz (77 kg)     Height 09/01/21 1001 5\' 8"  (1.727 m)     Head Circumference --      Peak Flow --      Pain Score 09/01/21 1001 0     Pain Loc --      Pain Edu? --      Excl. in [place]? --      Physical Exam Vitals and nursing note reviewed.  Constitutional:      General: He is not in acute distress.    Appearance: He is well-developed.  HENT:     Head: Normocephalic and atraumatic.     Mouth/Throat:     Mouth: Mucous membranes are dry.  Eyes:     Conjunctiva/sclera: Conjunctivae normal.  Cardiovascular:     Rate and Rhythm: Normal rate and regular rhythm.     Heart sounds: Normal heart sounds.  Pulmonary:     Effort: Pulmonary effort is normal. No respiratory distress.     Breath sounds: No wheezing.  Abdominal:     General: Abdomen is flat. There is no distension.  Genitourinary:    Comments: Foley catheter in place draining grossly bloody urine Musculoskeletal:     Cervical back: Neck supple.  Skin:    General: Skin is warm.     Capillary Refill: Capillary refill takes less than 2 seconds.     Findings: No rash.  Neurological:     Mental Status: He is alert.     Motor: No  abnormal muscle tone.     Comments: Oriented to person and place but not time.  Does not recall details leading up to his current ED visit.  Follows commands.  Strength out of 5 bilateral upper and lower extremities.  Normal sensation to light touch.      ____________________________________________   LABS (all labs ordered are listed, but only abnormal results are displayed)  Labs Reviewed  CBC WITH DIFFERENTIAL/PLATELET - Abnormal; Notable for the following components:      Result Value   RBC 2.50 (*)    Hemoglobin 8.4 (*)    HCT 25.4 (*)    MCV 101.6 (*)    Platelets 132 (*)    All other components within normal limits  COMPREHENSIVE METABOLIC PANEL - Abnormal; Notable for the following components:   Glucose, Bld 234 (*)    BUN 48 (*)    Creatinine, Ser 2.85 (*)    Calcium 7.8 (*)    Total Protein 6.2 (*)    GFR, Estimated 21 (*)    All other components within normal limits  LACTIC ACID, PLASMA - Abnormal; Notable for the following components:   Lactic Acid, Venous 2.4 (*)    All other components within normal limits  URINALYSIS, ROUTINE W REFLEX MICROSCOPIC - Abnormal; Notable for the following components:   Color, Urine RED (*)    APPearance TURBID (*)    Glucose, UA   (*)    Value: TEST NOT REPORTED DUE TO COLOR INTERFERENCE OF URINE PIGMENT   Hgb urine dipstick   (*)    Value: TEST NOT REPORTED DUE TO COLOR INTERFERENCE OF URINE PIGMENT   Bilirubin Urine   (*)    Value: TEST NOT REPORTED DUE TO COLOR INTERFERENCE OF URINE PIGMENT  Ketones, ur   (*)    Value: TEST NOT REPORTED DUE TO COLOR INTERFERENCE OF URINE PIGMENT   Protein, ur   (*)    Value: TEST NOT REPORTED DUE TO COLOR INTERFERENCE OF URINE PIGMENT   Nitrite   (*)    Value: TEST NOT REPORTED DUE TO COLOR INTERFERENCE OF URINE PIGMENT   Leukocytes,Ua   (*)    Value: TEST NOT REPORTED DUE TO COLOR INTERFERENCE OF URINE PIGMENT   RBC / HPF >50 (*)    WBC, UA >50 (*)    All other components within normal  limits  PROTIME-INR - Abnormal; Notable for the following components:   Prothrombin Time 18.1 (*)    INR 1.5 (*)    All other components within normal limits  TROPONIN I (HIGH SENSITIVITY) - Abnormal; Notable for the following components:   Troponin I (High Sensitivity) 20 (*)    All other components within normal limits  CULTURE, BLOOD (ROUTINE X 2)  CULTURE, BLOOD (ROUTINE X 2)  URINE CULTURE  RESP PANEL BY RT-PCR (FLU A&B, COVID) ARPGX2  LACTIC ACID, PLASMA  TYPE AND SCREEN  TROPONIN I (HIGH SENSITIVITY)    ____________________________________________  EKG: Atrial fibrillation, ventricular rate 69.  QRS 222.  QTc 562.  No acute ST elevations.  Repull abnormality.  No significant change when compared to prior. ________________________________________  RADIOLOGY All imaging, including plain films, CT scans, and ultrasounds, independently reviewed by me, and interpretations confirmed via formal radiology reads.  ED MD interpretation:   Chest x-ray: Cardiomegaly  Official radiology report(s): DG Chest Portable 1 View  Result Date: 09/01/2021 CLINICAL DATA:  Weakness EXAM: PORTABLE CHEST 1 VIEW COMPARISON:  01/05/2021 FINDINGS: Prior CABG. Cardiomegaly, vascular congestion. No confluent opacities or effusions. No acute bony abnormality. IMPRESSION: Cardiomegaly, vascular congestion. Electronically Signed   By: Rolm Baptise M.D.   On: 09/01/2021 10:17    ____________________________________________  PROCEDURES   Procedure(s) performed (including Critical Care):  Procedures  ____________________________________________  INITIAL IMPRESSION / MDM / [place] / ED COURSE  As part of my medical decision making, I reviewed the following data within the [place] notes reviewed and incorporated, Old chart reviewed, Notes from prior ED visits, and [place] Controlled Substance Database       *Michael Simon was evaluated in Emergency  Department on 09/01/2021 for the symptoms described in the history of present illness. He was evaluated in the context of the global COVID-19 pandemic, which necessitated consideration that the patient might be at risk for infection with the SARS-CoV-2 virus that causes COVID-19. Institutional protocols and algorithms that pertain to the evaluation of patients at risk for COVID-19 are in a state of rapid change based on information released by regulatory bodies including the CDC and federal and state organizations. These policies and algorithms were followed during the patient's care in the ED.  Some ED evaluations and interventions may be delayed as a result of limited staffing during the pandemic.*     Medical Decision Making: 87 year old male here with transient hypotension and decreased level of consciousness.  On arrival, the patient appears mildly dehydrated.  He has gross hematuria via Foley catheter placed yesterday.  Lab work shows significant hemoglobin drop compared to yesterday, likely due to his gross hematuria.  He also appears moderately dehydrated with slightly elevated BUN to creatinine ratio and lactic acid of 2.4.  He has mild pyuria, unclear if this is secondary to recent instrumentation versus UTI, although  I suspect his symptoms were more so secondary to transient hypotension in the setting of blood loss and dehydration.  Will start fluids, antibiotics.  Discussed with Dr. Caprice Beaver of urology, recommends to leave Foley in place with irrigation as needed.  The catheter seems to be draining well without need for CBI at this time.  Will plan to admit for observation, fluids, and trending of labs. lab work.  ____________________________________________  FINAL CLINICAL IMPRESSION(S) / ED DIAGNOSES  Final diagnoses:  Acute cystitis with hematuria  Blood loss anemia     MEDICATIONS GIVEN DURING THIS VISIT:  Medications  lactated ringers bolus 1,000 mL (0 mLs Intravenous Stopped  09/01/21 1139)  cefTRIAXone (ROCEPHIN) 2 g in sodium chloride 0.9 % 100 mL IVPB (0 g Intravenous Stopped 09/01/21 1138)  sodium chloride 0.9 % bolus 1,000 mL (1,000 mLs Intravenous New Bag/Given 09/01/21 1140)     ED Discharge Orders     None        Note:  This document was prepared using Dragon voice recognition software and may include unintentional dictation errors.   Duffy Bruce, MD 09/01/21 1201

## 2021-09-01 NOTE — ED Notes (Signed)
Urology at bedside.

## 2021-09-02 DIAGNOSIS — R310 Gross hematuria: Secondary | ICD-10-CM

## 2021-09-02 DIAGNOSIS — D62 Acute posthemorrhagic anemia: Secondary | ICD-10-CM

## 2021-09-02 DIAGNOSIS — E1122 Type 2 diabetes mellitus with diabetic chronic kidney disease: Secondary | ICD-10-CM

## 2021-09-02 DIAGNOSIS — R4182 Altered mental status, unspecified: Secondary | ICD-10-CM

## 2021-09-02 DIAGNOSIS — N3289 Other specified disorders of bladder: Secondary | ICD-10-CM

## 2021-09-02 DIAGNOSIS — R338 Other retention of urine: Secondary | ICD-10-CM

## 2021-09-02 DIAGNOSIS — I959 Hypotension, unspecified: Secondary | ICD-10-CM

## 2021-09-02 DIAGNOSIS — N184 Chronic kidney disease, stage 4 (severe): Secondary | ICD-10-CM

## 2021-09-02 LAB — BASIC METABOLIC PANEL
Anion gap: 9 (ref 5–15)
BUN: 42 mg/dL — ABNORMAL HIGH (ref 8–23)
CO2: 22 mmol/L (ref 22–32)
Calcium: 7.9 mg/dL — ABNORMAL LOW (ref 8.9–10.3)
Chloride: 106 mmol/L (ref 98–111)
Creatinine, Ser: 2.43 mg/dL — ABNORMAL HIGH (ref 0.61–1.24)
GFR, Estimated: 25 mL/min — ABNORMAL LOW (ref >60)
Glucose, Bld: 82 mg/dL (ref 70–99)
Potassium: 3.8 mmol/L (ref 3.5–5.1)
Sodium: 137 mmol/L (ref 135–145)

## 2021-09-02 LAB — GLUCOSE, CAPILLARY
Glucose-Capillary: 79 mg/dL (ref 70–99)
Glucose-Capillary: 147 mg/dL — ABNORMAL HIGH (ref 70–99)
Glucose-Capillary: 164 mg/dL — ABNORMAL HIGH (ref 70–99)
Glucose-Capillary: 105 mg/dL — ABNORMAL HIGH (ref 70–99)
Glucose-Capillary: 111 mg/dL — ABNORMAL HIGH (ref 70–99)

## 2021-09-02 LAB — HEMOGLOBIN AND HEMATOCRIT, BLOOD
HCT: 25.8 % — ABNORMAL LOW (ref 39.0–52.0)
Hemoglobin: 8.4 g/dL — ABNORMAL LOW (ref 13.0–17.0)

## 2021-09-02 MED ORDER — CHLORHEXIDINE GLUCONATE CLOTH 2 % EX PADS
6.0000 | MEDICATED_PAD | Freq: Every day | CUTANEOUS | Status: DC
  Administered 2021-09-03 – 2021-09-10 (×7): 6 via TOPICAL

## 2021-09-02 MED ORDER — SODIUM CHLORIDE 0.9 % IV SOLN
1.0000 g | INTRAVENOUS | Status: DC
  Administered 2021-09-02 – 2021-09-03 (×2): 1 g via INTRAVENOUS
  Filled 2021-09-02: qty 1
  Filled 2021-09-02: qty 10

## 2021-09-02 NOTE — Plan of Care (Signed)
  Problem: Education: Goal: Knowledge of General Education information will improve Description: Including pain rating scale, medication(s)/side effects and non-pharmacologic comfort measures Outcome: Progressing   Problem: Health Behavior/Discharge Planning: Goal: Ability to manage health-related needs will improve Outcome: Progressing   Problem: Clinical Measurements: Goal: Ability to maintain clinical measurements within normal limits will improve Outcome: Progressing Goal: Will remain free from infection Outcome: Progressing Goal: Diagnostic test results will improve Outcome: Progressing Goal: Respiratory complications will improve Outcome: Progressing Goal: Cardiovascular complication will be avoided Outcome: Progressing   Problem: Activity: Goal: Risk for activity intolerance will decrease Outcome: Progressing   Problem: Nutrition: Goal: Adequate nutrition will be maintained Outcome: Progressing   Problem: Coping: Goal: Level of anxiety will decrease Outcome: Progressing   Problem: Elimination: Goal: Will not experience complications related to bowel motility Outcome: Progressing Goal: Will not experience complications related to urinary retention Outcome: Progressing   Problem: Pain Managment: Goal: General experience of comfort will improve Outcome: Progressing   Problem: Skin Integrity: Goal: Risk for impaired skin integrity will decrease Outcome: Progressing  URO AT BEDSIDE IRRIGATING FC - SMALL CLOTS RETURNED AND URINE DK RED - FC DRAINING WNL

## 2021-09-02 NOTE — Progress Notes (Signed)
Urology Consult Follow Up  Subjective: Patient resting comfortably.    VSS afebrile. H & H stable at 8.4 and 25.8, respectively.  Serum creatinine down trending to 2.43  Blood and urine cultures still pending   Anti-infectives: Anti-infectives (From admission, onward)    Start     Dose/Rate Route Frequency Ordered Stop   09/02/21 1100  cefTRIAXone (ROCEPHIN) 2 g in sodium chloride 0.9 % 100 mL IVPB        2 g 200 mL/hr over 30 Minutes Intravenous Every 24 hours 09/01/21 1224     09/01/21 1045  cefTRIAXone (ROCEPHIN) 2 g in sodium chloride 0.9 % 100 mL IVPB        2 g 200 mL/hr over 30 Minutes Intravenous  Once 09/01/21 1044 09/01/21 1138       Current Facility-Administered Medications  Medication Dose Route Frequency Provider Last Rate Last Admin   0.9 %  sodium chloride infusion   Intravenous Continuous Agbata, Tochukwu, MD 100 mL/hr at 09/02/21 0050 New Bag at 09/02/21 0050   albuterol (PROVENTIL) (2.5 MG/3ML) 0.083% nebulizer solution 2.5 mg  2.5 mg Nebulization Q6H PRN Agbata, Tochukwu, MD       amiodarone (PACERONE) tablet 200 mg  200 mg Oral BID Agbata, Tochukwu, MD   200 mg at 09/01/21 2356   cefTRIAXone (ROCEPHIN) 2 g in sodium chloride 0.9 % 100 mL IVPB  2 g Intravenous Q24H Agbata, Tochukwu, MD       insulin aspart (novoLOG) injection 0-15 Units  0-15 Units Subcutaneous TID WC Agbata, Tochukwu, MD   3 Units at 09/01/21 1757   ondansetron (ZOFRAN) tablet 4 mg  4 mg Oral Q6H PRN Agbata, Tochukwu, MD       Or   ondansetron (ZOFRAN) injection 4 mg  4 mg Intravenous Q6H PRN Agbata, Tochukwu, MD       pantoprazole (PROTONIX) EC tablet 40 mg  40 mg Oral Daily Agbata, Tochukwu, MD       pravastatin (PRAVACHOL) tablet 40 mg  40 mg Oral q1800 Agbata, Tochukwu, MD   40 mg at 09/01/21 1757   tamsulosin (FLOMAX) capsule 0.4 mg  0.4 mg Oral Daily Agbata, Tochukwu, MD       vitamin B-12 (CYANOCOBALAMIN) tablet 1,000 mcg  1,000 mcg Oral Daily Agbata, Tochukwu, MD   1,000 mcg at 09/01/21  1757     Objective: Vital signs in last 24 hours: Temp:  [97.5 F (36.4 C)-98.3 F (36.8 C)] 97.6 F (36.4 C) (09/16 0616) Pulse Rate:  [54-119] 70 (09/16 0616) Resp:  [12-20] 16 (09/16 0616) BP: (91-127)/(49-72) 108/49 (09/16 0616) SpO2:  [86 %-100 %] 95 % (09/16 0616) Weight:  [77 kg] 77 kg (09/15 1001)  Intake/Output from previous day: 09/15 0701 - 09/16 0700 In: 2540.1 [I.V.:540.1; IV Piggyback:2000] Out: 1500 [Urine:1500] Intake/Output this shift: Total I/O In: 0  Out: 650 [Urine:650]   Physical Exam Constitutional:  Well nourished. Alert and oriented, No acute distress. HEENT: [place] AT, moist mucus membranes.  Trachea midline Cardiovascular: No clubbing, cyanosis, or edema. Respiratory: Normal respiratory effort, no increased work of breathing.   GU: No CVA tenderness.  No bladder fullness or masses.  Patient with uncircumcised phallus. Foreskin easily retracted  Urethral meatus is patent.  No penile discharge. No penile lesions or rashes.  Foley in place draining dark red urine with some clots in the tube.  Neurologic: Grossly intact, no focal deficits, moving all 4 extremities. Psychiatric: Normal mood and affect.   Lab Results:  Recent  Labs    08/31/21 1322 09/01/21 1004 09/01/21 1751 09/02/21 0445  WBC 9.3 9.2  --   --   HGB 10.8* 8.4* 8.3* 8.4*  HCT 32.6* 25.4* 24.6* 25.8*  PLT 172 132*  --   --    BMET Recent Labs    09/01/21 1004 09/02/21 0445  NA 137 137  K 4.0 3.8  CL 101 106  CO2 25 22  GLUCOSE 234* 82  BUN 48* 42*  CREATININE 2.85* 2.43*  CALCIUM 7.8* 7.9*   PT/INR Recent Labs    09/01/21 1004  LABPROT 18.1*  INR 1.5*   ABG No results for input(s): PHART, HCO3 in the last 72 hours.  Invalid input(s): PCO2, PO2  Studies/Results: DG Chest Portable 1 View  Result Date: 09/01/2021 CLINICAL DATA:  Weakness EXAM: PORTABLE CHEST 1 VIEW COMPARISON:  01/05/2021 FINDINGS: Prior CABG. Cardiomegaly, vascular congestion. No confluent  opacities or effusions. No acute bony abnormality. IMPRESSION: Cardiomegaly, vascular congestion. Electronically Signed   By: Rolm Baptise M.D.   On: 09/01/2021 10:17   ECHOCARDIOGRAM COMPLETE  Result Date: 09/01/2021    ECHOCARDIOGRAM REPORT   Patient Name:   Michael Simon Date of Exam: 09/01/2021 Medical Rec #:  [id number]            Height:       68.0 in Accession #:    [id number]           Weight:       169.8 lb Date of Birth:  [birth year]             BSA:          1.906 m Patient Age:    87 years             BP:           127/56 mmHg Patient Gender: M                    HR:           82 bpm. Exam Location:  [facility] Procedure: 2D Echo, Cardiac Doppler and Color Doppler Indications:     CHF- acute diastolic J49.70  History:         Patient has no prior history of Echocardiogram examinations.                  CAD and Previous Myocardial Infarction, Prior CABG; Risk                  Factors:Hypertension and Diabetes.  Sonographer:     Sherrie Sport Referring Phys:  [place] Diagnosing Phys: Neoma Laming  Sonographer Comments: Suboptimal apical window. IMPRESSIONS  1. Left ventricular ejection fraction, by estimation, is 35 to 40%. The left ventricle has moderately decreased function. The left ventricle has no regional wall motion abnormalities. There is mild concentric left ventricular hypertrophy. Left ventricular diastolic parameters are indeterminate.  2. Right ventricular systolic function is normal. The right ventricular size is normal.  3. Left atrial size was mild to moderately dilated.  4. Right atrial size was mild to moderately dilated.  5. The pericardial effusion is circumferential.  6. The mitral valve is normal in structure. Mild mitral valve regurgitation. No evidence of mitral stenosis.  7. The aortic valve is normal in structure. Aortic valve regurgitation is not visualized. No aortic stenosis is present.  8. The inferior vena cava is normal in size with greater than 50% respiratory  variability, suggesting right  atrial pressure of 3 mmHg. FINDINGS  Left Ventricle: Left ventricular ejection fraction, by estimation, is 35 to 40%. The left ventricle has moderately decreased function. The left ventricle has no regional wall motion abnormalities. The left ventricular internal cavity size was normal in size. There is mild concentric left ventricular hypertrophy. Left ventricular diastolic parameters are indeterminate. Right Ventricle: The right ventricular size is normal. No increase in right ventricular wall thickness. Right ventricular systolic function is normal. Left Atrium: Left atrial size was mild to moderately dilated. Right Atrium: Right atrial size was mild to moderately dilated. Pericardium: Trivial pericardial effusion is present. The pericardial effusion is circumferential. Mitral Valve: The mitral valve is normal in structure. Mild mitral valve regurgitation. No evidence of mitral valve stenosis. Tricuspid Valve: The tricuspid valve is normal in structure. Tricuspid valve regurgitation is mild . No evidence of tricuspid stenosis. Aortic Valve: The aortic valve is normal in structure. Aortic valve regurgitation is not visualized. No aortic stenosis is present. Aortic valve mean gradient measures 2.3 mmHg. Aortic valve peak gradient measures 4.2 mmHg. Aortic valve area, by VTI measures 1.33 cm. Pulmonic Valve: The pulmonic valve was normal in structure. Pulmonic valve regurgitation is not visualized. No evidence of pulmonic stenosis. Aorta: The aortic root is normal in size and structure. Venous: The inferior vena cava is normal in size with greater than 50% respiratory variability, suggesting right atrial pressure of 3 mmHg. IAS/Shunts: No atrial level shunt detected by color flow Doppler.  LEFT VENTRICLE PLAX 2D LVIDd:         4.07 cm LVIDs:         3.30 cm LV PW:         1.44 cm LV IVS:        1.03 cm LVOT diam:     1.90 cm LV SV:         27 LV SV Index:   14 LVOT Area:     2.84 cm   RIGHT VENTRICLE RV Basal diam:  4.11 cm RV S prime:     6.96 cm/s TAPSE (M-mode): 3.7 cm LEFT ATRIUM             Index       RIGHT ATRIUM           Index LA diam:        4.30 cm 2.26 cm/m  RA Area:     14.50 cm LA Vol (A2C):   62.8 ml 32.95 ml/m RA Volume:   39.90 ml  20.93 ml/m LA Vol (A4C):   52.7 ml 27.65 ml/m LA Biplane Vol: 57.4 ml 30.11 ml/m  AORTIC VALVE                   PULMONIC VALVE AV Area (Vmax):    1.51 cm    PV Vmax:        0.92 m/s AV Area (Vmean):   1.60 cm    PV Peak grad:   3.4 mmHg AV Area (VTI):     1.33 cm    RVOT Peak grad: 5 mmHg AV Vmax:           102.10 cm/s AV Vmean:          67.800 cm/s AV VTI:            0.202 m AV Peak Grad:      4.2 mmHg AV Mean Grad:      2.3 mmHg LVOT Vmax:  54.20 cm/s LVOT Vmean:        38.300 cm/s LVOT VTI:          0.095 m LVOT/AV VTI ratio: 0.47  AORTA Ao Root diam: 3.30 cm MITRAL VALVE MV Area (PHT): 3.20 cm     SHUNTS MV Decel Time: 237 msec     Systemic VTI:  0.09 m MV E velocity: 100.00 cm/s  Systemic Diam: 1.90 cm Neoma Laming Electronically signed by Neoma Laming Signature Date/Time: 09/01/2021/7:29:28 PM    Final     Bladder Irrigation Patient was cleaned and prepped in a sterile fashion. 1000 ml of saline/sterile water was instilled and irrigated into the bladder with a 35ml [place] syringe through the catheter in place until light pink with return of 20 cc of clot.    Assessment and Plan: 87 year old who presented to our office on 08/31/2021 in urinary retention.  Foley was placed for a PVR of 900 cc.  He then presented to the ED 09/01/2021 for gross hematuria, hypotension, mental status change and AKI.    Recommendations: -hold Eliquis until urine clears -continue empiric antibiotics and follow urine cultures -continue to trend CBC  -manually irrigate Foley catheter as needed -outpatient voiding trial in 1 to 2 weeks, already scheduled   LOS: 1 day    [facility] 09/02/2021

## 2021-09-02 NOTE — Progress Notes (Addendum)
PROGRESS NOTE    Michael Simon  GXQ:[id number] DOB: [birth year] DOA: 09/01/2021 PCP: [name], MD  Outpatient Specialists: urology    Brief Narrative:   From admission hpi: Michael Simon is a 87 y.o. male with medical history significant for chronic urinary retention status post insertion of Foley catheter on 08/31/21, diabetes mellitus, hypertension, chronic A. fib on anticoagulation therapy who presents to the ER via EMS for evaluation of mental status changes. Patient's son states that he had seen neurology 1 day prior to his admission for symptoms related to chronic urinary retention and had a Foley catheter placed.  He states that the Foley catheter insertion was difficult and multiple attempts were made prior to getting it in.  He states that patient had blood in the bag after the procedure and continued bleeding even after they went home. On the morning of his admission patient was found unresponsive in his chair and so EMS was called.  He was hypotensive with blood pressure of 60/30 and received IV fluids via EMS.  At the time of my evaluation he is awake and alert blood pressure has improved. He denies having any abdominal pain, no fever, no chills, no changes in his bowel habits, no melena stools, no hematochezia, no headache, no leg swelling, no blurred vision, no palpitations, no diaphoresis, no difficulty swallowing, no cough.   Assessment & Plan:   Principal Problem:   Acute blood loss anemia Active Problems:   Diabetes mellitus (HCC)   Essential hypertension   Generalized weakness   Atrial flutter (HCC)   CKD stage 4 due to type 2 diabetes mellitus (HCC)   Hematuria, gross   Acute lower UTI   Acute blood loss anemia (ABLA)  # BPH with obstruction and urinary retention Foley placed 9/14, traumatic - maintain foley, TOV scheduled w/ urology as outpatient - cont flomax - empiric ceftriaxone while awaiting results of urine culture  # Acute blood loss  anemia secondary to traumatic Foley Irrigated by urology 9/15, now appears to be clearing. Hgb in 8s from baseline 10. - urology following - monitor hgb - holding apixaban  # Hypotension # unresponsiveness # History hypertension Likely 2/2 combination of hypovolemia (reduced po, post-obstructive diuresis) and anemia. Resolved - monitor - hold home lasix, lisinopril - family desires pt consult, will order  # AKI on ckd3b/4 Baseline cr appears to be around 2, here upper 2s on presentation, 2.43 today, likely 2/2 obstruction - monitor - gentle fluids  # A flutter Rate controlled - holding apixaban as above - cont home amio  # T2DM Glucose wnl here - SSI, holdin ghome meds   DVT prophylaxis: SCDs Code Status: full Family Communication: son updated @ bedside 9/16  Level of care: Med-Surg Status is: Inpatient  Remains inpatient appropriate because:Inpatient level of care appropriate due to severity of illness  Dispo: The patient is from: Home              Anticipated d/c is to: Home              Patient currently is not medically stable to d/c.   Difficult to place patient No        Consultants:  urology  Procedures: none  Antimicrobials:  Ceftriaxone 9/15>    Subjective: This morning feeling well, denies pain, no confusion  Objective: Vitals:   09/01/21 2203 09/02/21 0047 09/02/21 0616 09/02/21 0902  BP: (!) 91/57 125/70 (!) 108/49 (!) 113/57  Pulse: 76 72 70 60  Resp: 16 20 16 18   Temp:  98.3 F (36.8 C) 97.6 F (36.4 C) 98 F (36.7 C)  TempSrc:  Oral    SpO2: 100% 97% 95% 99%  Weight:      Height:        Intake/Output Summary (Last 24 hours) at 09/02/2021 0957 Last data filed at 09/02/2021 0720 Gross per 24 hour  Intake 2540.14 ml  Output 2150 ml  Net 390.14 ml   Filed Weights   09/01/21 1001  Weight: 77 kg    Examination:  General exam: Appears calm and comfortable  Respiratory system: Clear to auscultation save for rales at  bases Cardiovascular system: S1 & S2 heard, RRR. No JVD, murmurs, rubs, gallops or clicks. No pedal edema. Gastrointestinal system: Abdomen is nondistended, soft and nontender. No organomegaly or masses felt. Normal bowel sounds heard. Central nervous system: Alert and oriented. No focal neurological deficits. Extremities: Symmetric 5 x 5 power. Skin: No rashes, lesions or ulcers Psychiatry: Judgement and insight appear normal. Mood & affect appropriate.     Data Reviewed: I have personally reviewed following labs and imaging studies  CBC: Recent Labs  Lab 08/31/21 1322 09/01/21 1004 09/01/21 1751 09/02/21 0445  WBC 9.3 9.2  --   --   NEUTROABS 6,436 7.0  --   --   HGB 10.8* 8.4* 8.3* 8.4*  HCT 32.6* 25.4* 24.6* 25.8*  MCV 100.3* 101.6*  --   --   PLT 172 132*  --   --    Basic Metabolic Panel: Recent Labs  Lab 08/31/21 1322 09/01/21 1004 09/02/21 0445  NA 141 137 137  K 4.6 4.0 3.8  CL 101 101 106  CO2 28 25 22   GLUCOSE 122* 234* 82  BUN 42* 48* 42*  CREATININE 2.67* 2.85* 2.43*  CALCIUM 8.8 7.8* 7.9*   GFR: Estimated Creatinine Clearance: 21.1 mL/min (A) (by C-G formula based on SCr of 2.43 mg/dL (H)). Liver Function Tests: Recent Labs  Lab 08/31/21 1322 09/01/21 1004  AST 24 25  ALT 16 13  ALKPHOS  --  111  BILITOT 0.7 0.7  PROT 7.6 6.2*  ALBUMIN  --  3.7   No results for input(s): LIPASE, AMYLASE in the last 168 hours. No results for input(s): AMMONIA in the last 168 hours. Coagulation Profile: Recent Labs  Lab 09/01/21 1004  INR 1.5*   Cardiac Enzymes: No results for input(s): CKTOTAL, CKMB, CKMBINDEX, TROPONINI in the last 168 hours. BNP (last 3 results) No results for input(s): PROBNP in the last 8760 hours. HbA1C: Recent Labs    09/01/21 1004  HGBA1C 7.6*   CBG: Recent Labs  Lab 09/01/21 1737 09/02/21 0820  GLUCAP 162* 79   Lipid Profile: No results for input(s): CHOL, HDL, LDLCALC, TRIG, CHOLHDL, LDLDIRECT in the last 72  hours. Thyroid Function Tests: No results for input(s): TSH, T4TOTAL, FREET4, T3FREE, THYROIDAB in the last 72 hours. Anemia Panel: No results for input(s): VITAMINB12, FOLATE, FERRITIN, TIBC, IRON, RETICCTPCT in the last 72 hours. Urine analysis:    Component Value Date/Time   COLORURINE RED (A) 09/01/2021 1004   APPEARANCEUR TURBID (A) 09/01/2021 1004   LABSPEC 1.020 09/01/2021 1004   PHURINE  09/01/2021 1004    TEST NOT REPORTED DUE TO COLOR INTERFERENCE OF URINE PIGMENT   GLUCOSEU (A) 09/01/2021 1004    TEST NOT REPORTED DUE TO COLOR INTERFERENCE OF URINE PIGMENT   HGBUR (A) 09/01/2021 1004    TEST NOT REPORTED DUE TO COLOR INTERFERENCE OF  URINE PIGMENT   BILIRUBINUR (A) 09/01/2021 1004    TEST NOT REPORTED DUE TO COLOR INTERFERENCE OF URINE PIGMENT   BILIRUBINUR Negative 08/29/2021 1303   KETONESUR (A) 09/01/2021 1004    TEST NOT REPORTED DUE TO COLOR INTERFERENCE OF URINE PIGMENT   PROTEINUR (A) 09/01/2021 1004    TEST NOT REPORTED DUE TO COLOR INTERFERENCE OF URINE PIGMENT   UROBILINOGEN 0.2 08/29/2021 1303   NITRITE (A) 09/01/2021 1004    TEST NOT REPORTED DUE TO COLOR INTERFERENCE OF URINE PIGMENT   LEUKOCYTESUR (A) 09/01/2021 1004    TEST NOT REPORTED DUE TO COLOR INTERFERENCE OF URINE PIGMENT   Sepsis Labs: @LABRCNTIP (procalcitonin:4,lacticidven:4)  ) Recent Results (from the past 240 hour(s))  Blood culture (routine x 2)     Status: None (Preliminary result)   Collection Time: 09/01/21 11:08 AM   Specimen: BLOOD  Result Value Ref Range Status   Specimen Description BLOOD  [facility]  Final   Special Requests   Final    BOTTLES DRAWN AEROBIC AND ANAEROBIC Blood Culture results may not be optimal due to an inadequate volume of blood received in culture bottles   Culture   Final    NO GROWTH < 24 HOURS Performed at [facility], [address] [place], [place] [address]    Report Status PENDING  Incomplete  Blood culture (routine x 2)     Status: None  (Preliminary result)   Collection Time: 09/01/21 11:08 AM   Specimen: BLOOD  Result Value Ref Range Status   Specimen Description BLOOD RIGHT ANTECUBITAL  Final   Special Requests   Final    BOTTLES DRAWN AEROBIC AND ANAEROBIC Blood Culture adequate volume   Culture   Final    NO GROWTH < 24 HOURS Performed at [facility], [address] [place],  [address]    Report Status PENDING  Incomplete  Resp Panel by RT-PCR (Flu A&B, Covid) Nasopharyngeal Swab     Status: None   Collection Time: 09/01/21 12:53 PM   Specimen: Nasopharyngeal Swab; Nasopharyngeal(NP) swabs in vial transport medium  Result Value Ref Range Status   SARS Coronavirus 2 by RT PCR NEGATIVE NEGATIVE Final    Comment: (NOTE) SARS-CoV-2 target nucleic acids are NOT DETECTED.  The SARS-CoV-2 RNA is generally detectable in upper respiratory specimens during the acute phase of infection. The lowest concentration of SARS-CoV-2 viral copies this assay can detect is 138 copies/mL. A negative result does not preclude SARS-Cov-2 infection and should not be used as the sole basis for treatment or other patient management decisions. A negative result may occur with  improper specimen collection/handling, submission of specimen other than nasopharyngeal swab, presence of viral mutation(s) within the areas targeted by this assay, and inadequate number of viral copies(<138 copies/mL). A negative result must be combined with clinical observations, patient history, and epidemiological information. The expected result is Negative.  Fact Sheet for Patients:  EntrepreneurPulse.com.au  Fact Sheet for Healthcare Providers:  IncredibleEmployment.be  This test is no t yet approved or cleared by the [place] FDA and  has been authorized for detection and/or diagnosis of SARS-CoV-2 by FDA under an Emergency Use Authorization (EUA). This EUA will remain  in effect (meaning this  test can be used) for the duration of the COVID-19 declaration under Section 564(b)(1) of the Act, 21 U.S.C.section 360bbb-3(b)(1), unless the authorization is terminated  or revoked sooner.       Influenza A by PCR NEGATIVE NEGATIVE Final   Influenza  B by PCR NEGATIVE NEGATIVE Final    Comment: (NOTE) The Xpert Xpress SARS-CoV-2/FLU/RSV plus assay is intended as an aid in the diagnosis of influenza from Nasopharyngeal swab specimens and should not be used as a sole basis for treatment. Nasal washings and aspirates are unacceptable for Xpert Xpress SARS-CoV-2/FLU/RSV testing.  Fact Sheet for Patients: EntrepreneurPulse.com.au  Fact Sheet for Healthcare Providers: IncredibleEmployment.be  This test is not yet approved or cleared by the [place] FDA and has been authorized for detection and/or diagnosis of SARS-CoV-2 by FDA under an Emergency Use Authorization (EUA). This EUA will remain in effect (meaning this test can be used) for the duration of the COVID-19 declaration under Section 564(b)(1) of the Act, 21 U.S.C. section 360bbb-3(b)(1), unless the authorization is terminated or revoked.  Performed at [facility], [address], [place], [place] [address]          Radiology Studies: DG Chest Portable 1 View  Result Date: 09/01/2021 CLINICAL DATA:  Weakness EXAM: PORTABLE CHEST 1 VIEW COMPARISON:  01/05/2021 FINDINGS: Prior CABG. Cardiomegaly, vascular congestion. No confluent opacities or effusions. No acute bony abnormality. IMPRESSION: Cardiomegaly, vascular congestion. Electronically Signed   By: Rolm Baptise M.D.   On: 09/01/2021 10:17   ECHOCARDIOGRAM COMPLETE  Result Date: 09/01/2021    ECHOCARDIOGRAM REPORT   Patient Name:   Michael Simon Date of Exam: 09/01/2021 Medical Rec #:  [id number]            Height:       68.0 in Accession #:    [id number]           Weight:       169.8 lb Date of Birth:  [birth year]              BSA:          1.906 m Patient Age:    82 years             BP:           127/56 mmHg Patient Gender: M                    HR:           82 bpm. Exam Location:  [facility] Procedure: 2D Echo, Cardiac Doppler and Color Doppler Indications:     CHF- acute diastolic I77.82  History:         Patient has no prior history of Echocardiogram examinations.                  CAD and Previous Myocardial Infarction, Prior CABG; Risk                  Factors:Hypertension and Diabetes.  Sonographer:     Sherrie Sport Referring Phys:  Adrian Diagnosing Phys: Neoma Laming  Sonographer Comments: Suboptimal apical window. IMPRESSIONS  1. Left ventricular ejection fraction, by estimation, is 35 to 40%. The left ventricle has moderately decreased function. The left ventricle has no regional wall motion abnormalities. There is mild concentric left ventricular hypertrophy. Left ventricular diastolic parameters are indeterminate.  2. Right ventricular systolic function is normal. The right ventricular size is normal.  3. Left atrial size was mild to moderately dilated.  4. Right atrial size was mild to moderately dilated.  5. The pericardial effusion is circumferential.  6. The mitral valve is normal in structure. Mild mitral valve regurgitation. No evidence of mitral stenosis.  7. The aortic valve is  normal in structure. Aortic valve regurgitation is not visualized. No aortic stenosis is present.  8. The inferior vena cava is normal in size with greater than 50% respiratory variability, suggesting right atrial pressure of 3 mmHg. FINDINGS  Left Ventricle: Left ventricular ejection fraction, by estimation, is 35 to 40%. The left ventricle has moderately decreased function. The left ventricle has no regional wall motion abnormalities. The left ventricular internal cavity size was normal in size. There is mild concentric left ventricular hypertrophy. Left ventricular diastolic parameters are indeterminate. Right Ventricle:  The right ventricular size is normal. No increase in right ventricular wall thickness. Right ventricular systolic function is normal. Left Atrium: Left atrial size was mild to moderately dilated. Right Atrium: Right atrial size was mild to moderately dilated. Pericardium: Trivial pericardial effusion is present. The pericardial effusion is circumferential. Mitral Valve: The mitral valve is normal in structure. Mild mitral valve regurgitation. No evidence of mitral valve stenosis. Tricuspid Valve: The tricuspid valve is normal in structure. Tricuspid valve regurgitation is mild . No evidence of tricuspid stenosis. Aortic Valve: The aortic valve is normal in structure. Aortic valve regurgitation is not visualized. No aortic stenosis is present. Aortic valve mean gradient measures 2.3 mmHg. Aortic valve peak gradient measures 4.2 mmHg. Aortic valve area, by VTI measures 1.33 cm. Pulmonic Valve: The pulmonic valve was normal in structure. Pulmonic valve regurgitation is not visualized. No evidence of pulmonic stenosis. Aorta: The aortic root is normal in size and structure. Venous: The inferior vena cava is normal in size with greater than 50% respiratory variability, suggesting right atrial pressure of 3 mmHg. IAS/Shunts: No atrial level shunt detected by color flow Doppler.  LEFT VENTRICLE PLAX 2D LVIDd:         4.07 cm LVIDs:         3.30 cm LV PW:         1.44 cm LV IVS:        1.03 cm LVOT diam:     1.90 cm LV SV:         27 LV SV Index:   14 LVOT Area:     2.84 cm  RIGHT VENTRICLE RV Basal diam:  4.11 cm RV S prime:     6.96 cm/s TAPSE (M-mode): 3.7 cm LEFT ATRIUM             Index       RIGHT ATRIUM           Index LA diam:        4.30 cm 2.26 cm/m  RA Area:     14.50 cm LA Vol (A2C):   62.8 ml 32.95 ml/m RA Volume:   39.90 ml  20.93 ml/m LA Vol (A4C):   52.7 ml 27.65 ml/m LA Biplane Vol: 57.4 ml 30.11 ml/m  AORTIC VALVE                   PULMONIC VALVE AV Area (Vmax):    1.51 cm    PV Vmax:        0.92  m/s AV Area (Vmean):   1.60 cm    PV Peak grad:   3.4 mmHg AV Area (VTI):     1.33 cm    RVOT Peak grad: 5 mmHg AV Vmax:           102.10 cm/s AV Vmean:          67.800 cm/s AV VTI:  0.202 m AV Peak Grad:      4.2 mmHg AV Mean Grad:      2.3 mmHg LVOT Vmax:         54.20 cm/s LVOT Vmean:        38.300 cm/s LVOT VTI:          0.095 m LVOT/AV VTI ratio: 0.47  AORTA Ao Root diam: 3.30 cm MITRAL VALVE MV Area (PHT): 3.20 cm     SHUNTS MV Decel Time: 237 msec     Systemic VTI:  0.09 m MV E velocity: 100.00 cm/s  Systemic Diam: 1.90 cm Neoma Laming Electronically signed by Neoma Laming Signature Date/Time: 09/01/2021/7:29:28 PM    Final         Scheduled Meds:  amiodarone  200 mg Oral BID   insulin aspart  0-15 Units Subcutaneous TID WC   pantoprazole  40 mg Oral Daily   pravastatin  40 mg Oral q1800   tamsulosin  0.4 mg Oral Daily   vitamin B-12  1,000 mcg Oral Daily   Continuous Infusions:  sodium chloride 100 mL/hr at 09/02/21 0050   cefTRIAXone (ROCEPHIN)  IV       LOS: 1 day    Time spent: 34 min    Desma Maxim, MD [facility]   If 7PM-7AM, please contact night-coverage [email or web address] Password [email or web address] 09/02/2021, 9:57 AM

## 2021-09-02 NOTE — Evaluation (Signed)
Physical Therapy Evaluation Patient Details Name: Michael Simon MRN: [id number] DOB: [birth year] Today's Date: 09/02/2021  History of Present Illness  Pt is an 87 y.o. male with medical history significant for chronic urinary retention status post insertion of Foley catheter on 08/31/21, diabetes mellitus, hypertension, chronic A. fib on anticoagulation therapy who presents to the ER via EMS for evaluation of mental status changes.   Clinical Impression  Patient alert, oriented to self (difficulty recalling birthday) identified he was in the hospital but not by name, disoriented to date. Seemed to be different from baseline per family, but able to provide PLOF (verified by family in room). Pt has family members that work together to provide nearly 24/7 assistance for patient as needed, previously independent for mobility.   The patient demonstrated sit <> stand initially with RW and minA for posterior lean, but with repetition and time progressed to being able to stand with CGA without RW at end of session. BP assessed in standing, 97/56 asymptomatic. Returned to sitting and a few exercises performed and BP reassessed in standing with 110s/50s. The patient was able to ambulate ~138ft with RW total, two sitting rest breaks (led by PT based on WOB). No LOB noted.  Overall the patient demonstrated deficits (see "PT Problem List") that impede the patient's functional abilities, safety, and mobility and would benefit from skilled PT intervention. Recommendation is HHPT with supervision for mobility/OOB.     Recommendations for follow up therapy are one component of a multi-disciplinary discharge planning process, led by the attending physician.  Recommendations may be updated based on patient status, additional functional criteria and insurance authorization.  Follow Up Recommendations Home health PT;Supervision for mobility/OOB    Equipment Recommendations  None recommended by PT     Recommendations for Other Services       Precautions / Restrictions Precautions Precautions: Fall Restrictions Weight Bearing Restrictions: No      Mobility  Bed Mobility               General bed mobility comments: pt up in chair at start/end of session    Transfers Overall transfer level: Needs assistance Equipment used: Rolling walker (2 wheeled);None Transfers: Sit to/from Stand Sit to Stand: Min guard;Min assist         General transfer comment: able to stand at end of session with CGA without RW, initially needed minA to come up into standing  Ambulation/Gait Ambulation/Gait assistance: Min guard   Assistive device: Rolling walker (2 wheeled)       General Gait Details: ambulated ~156ft with RW, one seated rest break, ER of RLE noted throughout mobility  Stairs            Wheelchair Mobility    Modified Rankin (Stroke Patients Only)       Balance Overall balance assessment: Needs assistance Sitting-balance support: Feet supported Sitting balance-Leahy Scale: Good       Standing balance-Leahy Scale: Fair Standing balance comment: improved stability noted with RW                             Pertinent Vitals/Pain Pain Assessment: No/denies pain    Home Living Family/patient expects to be discharged to:: Private residence Living Arrangements: Children (daughter works 3-7PM) Available Help at Discharge: Family;Available 24 hours/day Type of Home: House Home Access: Stairs to enter Entrance Stairs-Rails: Right Entrance Stairs-Number of Steps: 3 Home Layout: One level Home Equipment: Grab bars - toilet;Grab bars -  tub/shower;Walker - 2 wheels;Shower seat;Cane - single point;Walker - 4 wheels      Prior Function Level of Independence: Independent         Comments: ADLs and light IADLs, pt daughters assist with cooking/cleaning     Hand Dominance   Dominant Hand: Right    Extremity/Trunk Assessment                 Communication   Communication: No difficulties  Cognition Arousal/Alertness: Awake/alert Behavior During Therapy: WFL for tasks assessed/performed                                   General Comments: family seem to indicate pt not at baseline cognitive function. oriented to self (difficulty recalling birthday), unable to state date      General Comments      Exercises     Assessment/Plan    PT Assessment Patient needs continued PT services  PT Problem List Decreased strength;Decreased activity tolerance;Decreased balance;Decreased mobility       PT Treatment Interventions Therapeutic exercise;DME instruction;Gait training;Balance training;Stair training;Neuromuscular re-education;Functional mobility training;Therapeutic activities;Patient/family education    PT Goals (Current goals can be found in the Care Plan section)  Acute Rehab PT Goals Patient Stated Goal: to get up and walk PT Goal Formulation: With patient Time For Goal Achievement: 09/16/21 Potential to Achieve Goals: Good    Frequency Min 2X/week   Barriers to discharge        Co-evaluation               AM-PAC PT "6 Clicks" Mobility  Outcome Measure Help needed turning from your back to your side while in a flat bed without using bedrails?: A Little Help needed moving from lying on your back to sitting on the side of a flat bed without using bedrails?: A Little Help needed moving to and from a bed to a chair (including a wheelchair)?: A Little Help needed standing up from a chair using your arms (e.g., wheelchair or bedside chair)?: A Little Help needed to walk in hospital room?: A Little Help needed climbing 3-5 steps with a railing? : A Little 6 Click Score: 18    End of Session Equipment Utilized During Treatment: Gait belt Activity Tolerance: Patient tolerated treatment well Patient left: in chair;with call bell/phone within reach;with chair alarm set Nurse  Communication: Mobility status PT Visit Diagnosis: Other abnormalities of gait and mobility (R26.89);Difficulty in walking, not elsewhere classified (R26.2)    Time: 9191-6606 PT Time Calculation (min) (ACUTE ONLY): 39 min   Charges:   PT Evaluation $PT Eval Low Complexity: 1 Low PT Treatments $Therapeutic Exercise: 8-22 mins $Therapeutic Activity: 8-22 mins      Lieutenant Diego PT, DPT 2:33 PM,09/02/21

## 2021-09-02 NOTE — Progress Notes (Signed)
O2 at 88% on RA. Head of bed raised to 30%. Administered 2L O2 via nasal cannula. O2 at 100% on 2L.

## 2021-09-03 ENCOUNTER — Inpatient Hospital Stay: Payer: Medicare HMO

## 2021-09-03 ENCOUNTER — Other Ambulatory Visit: Payer: Self-pay

## 2021-09-03 DIAGNOSIS — D62 Acute posthemorrhagic anemia: Secondary | ICD-10-CM | POA: Diagnosis not present

## 2021-09-03 LAB — VITAMIN B12: Vitamin B-12: 1599 pg/mL — ABNORMAL HIGH (ref 180–914)

## 2021-09-03 LAB — CBC
HCT: 24.5 % — ABNORMAL LOW (ref 39.0–52.0)
Hemoglobin: 8.1 g/dL — ABNORMAL LOW (ref 13.0–17.0)
MCH: 33.3 pg (ref 26.0–34.0)
MCHC: 33.1 g/dL (ref 30.0–36.0)
MCV: 100.8 fL — ABNORMAL HIGH (ref 80.0–100.0)
Platelets: 145 10*3/uL — ABNORMAL LOW (ref 150–400)
RBC: 2.43 MIL/uL — ABNORMAL LOW (ref 4.22–5.81)
RDW: 13.8 % (ref 11.5–15.5)
WBC: 8.5 10*3/uL (ref 4.0–10.5)
nRBC: 0 % (ref 0.0–0.2)

## 2021-09-03 LAB — BASIC METABOLIC PANEL
Anion gap: 8 (ref 5–15)
BUN: 36 mg/dL — ABNORMAL HIGH (ref 8–23)
CO2: 24 mmol/L (ref 22–32)
Calcium: 8 mg/dL — ABNORMAL LOW (ref 8.9–10.3)
Chloride: 103 mmol/L (ref 98–111)
Creatinine, Ser: 1.95 mg/dL — ABNORMAL HIGH (ref 0.61–1.24)
GFR, Estimated: 33 mL/min — ABNORMAL LOW (ref >60)
Glucose, Bld: 133 mg/dL — ABNORMAL HIGH (ref 70–99)
Potassium: 3.9 mmol/L (ref 3.5–5.1)
Sodium: 135 mmol/L (ref 135–145)

## 2021-09-03 LAB — GLUCOSE, CAPILLARY
Glucose-Capillary: 115 mg/dL — ABNORMAL HIGH (ref 70–99)
Glucose-Capillary: 141 mg/dL — ABNORMAL HIGH (ref 70–99)
Glucose-Capillary: 167 mg/dL — ABNORMAL HIGH (ref 70–99)
Glucose-Capillary: 137 mg/dL — ABNORMAL HIGH (ref 70–99)

## 2021-09-03 LAB — URINE CULTURE: Culture: NO GROWTH

## 2021-09-03 NOTE — Progress Notes (Signed)
PT Cancellation Note  Patient Details Name: Michael Simon MRN: [id number] DOB: 1933/07/16   Cancelled Treatment:    Reason Eval/Treat Not Completed: Patient at procedure or test/unavailable   Pt out of room during attempt.  Will attempt again at a later date.   Chesley Noon 09/03/2021, 3:34 PM

## 2021-09-03 NOTE — Plan of Care (Signed)

## 2021-09-03 NOTE — Progress Notes (Addendum)
PROGRESS NOTE    Michael Simon  ZOX:[id number] DOB: 1933/10/11 DOA: 09/01/2021 PCP: [name], MD  Outpatient Specialists: urology    Brief Narrative:   From admission hpi: Michael Simon is a 87 y.o. male with medical history significant for chronic urinary retention status post insertion of Foley catheter on 08/31/21, diabetes mellitus, hypertension, chronic A. fib on anticoagulation therapy who presents to the ER via EMS for evaluation of mental status changes. Patient's son states that he had seen neurology 1 day prior to his admission for symptoms related to chronic urinary retention and had a Foley catheter placed.  He states that the Foley catheter insertion was difficult and multiple attempts were made prior to getting it in.  He states that patient had blood in the bag after the procedure and continued bleeding even after they went home. On the morning of his admission patient was found unresponsive in his chair and so EMS was called.  He was hypotensive with blood pressure of 60/30 and received IV fluids via EMS.  At the time of my evaluation he is awake and alert blood pressure has improved. He denies having any abdominal pain, no fever, no chills, no changes in his bowel habits, no melena stools, no hematochezia, no headache, no leg swelling, no blurred vision, no palpitations, no diaphoresis, no difficulty swallowing, no cough.   Assessment & Plan:   Principal Problem:   Acute blood loss anemia Active Problems:   Diabetes mellitus (HCC)   Essential hypertension   Generalized weakness   Atrial flutter (HCC)   CKD stage 4 due to type 2 diabetes mellitus (HCC)   Hematuria, gross   Acute lower UTI   Acute blood loss anemia (ABLA)  # BPH with obstruction and urinary retention Foley placed 9/14, traumatic - maintain foley, TOV scheduled w/ urology as outpatient - cont flomax - stop ceftriaxone, urine culture is neg  # Acute blood loss anemia secondary to  traumatic Foley Irrigated by urology 9/15, now appears to be clearing. Hgb in 8s from baseline 10. Discussed w/ dr. Jeffie Pollock today. Hematuria persists. Dr. Jeffie Pollock advises bladder u/s. If no retained clot, ok to d/c home holding eliquis, with close outpt urology f/u - monitor hgb - f/u bladder u/s - holding apixaban  # Hypotension # unresponsiveness # History hypertension Likely 2/2 combination of hypovolemia (reduced po, post-obstructive diuresis) and anemia. Resolved - monitor - hold home lasix, lisinopril - PT consulted, advising HH PT  # AKI on ckd3b/4 Baseline cr appears to be around 2, here upper 2s on presentation, 1.93 today, likely 2/2 obstruction - monitor - d/c fluids  # A flutter Rate controlled - holding apixaban as above - cont home amio  # T2DM Glucose wnl here - SSI, holdin ghome meds   DVT prophylaxis: SCDs Code Status: full Family Communication: son updated @ bedside 9/17  Level of care: Med-Surg Status is: Inpatient  Remains inpatient appropriate because:Inpatient level of care appropriate due to severity of illness  Dispo: The patient is from: Home              Anticipated d/c is to: Home with home health              Patient currently is not medically stable to d/c.   Difficult to place patient No        Consultants:  urology  Procedures: none  Antimicrobials:  Ceftriaxone 9/15>9/17   Subjective: This morning feeling well, denies pain  Objective: Vitals:  09/02/21 2128 09/03/21 0527 09/03/21 0754 09/03/21 1203  BP: (!) 96/50 114/66 106/64 102/65  Pulse: 76 85 83 (!) 59  Resp: 14 15 16 18   Temp: 98.4 F (36.9 C) (!) 97.4 F (36.3 C) 97.7 F (36.5 C) 98.2 F (36.8 C)  TempSrc: Oral Axillary    SpO2: 100% 95% 99% 94%  Weight:      Height:        Intake/Output Summary (Last 24 hours) at 09/03/2021 1413 Last data filed at 09/03/2021 1209 Gross per 24 hour  Intake 1714.29 ml  Output 3050 ml  Net -1335.71 ml   Filed Weights    09/01/21 1001  Weight: 77 kg    Examination:  General exam: Appears calm and comfortable  Respiratory system: Clear to auscultation save for rales at bases Cardiovascular system: S1 & S2 heard, RRR. No JVD, murmurs, rubs, gallops or clicks. No pedal edema. Gastrointestinal system: Abdomen is nondistended, soft and nontender. No organomegaly or masses felt. Normal bowel sounds heard. Central nervous system: Alert and oriented. No focal neurological deficits. Extremities: Symmetric 5 x 5 power. Skin: No rashes, lesions or ulcers Psychiatry: Judgement and insight appear normal. Mood & affect appropriate.     Data Reviewed: I have personally reviewed following labs and imaging studies  CBC: Recent Labs  Lab 08/31/21 1322 09/01/21 1004 09/01/21 1751 09/02/21 0445 09/03/21 0516  WBC 9.3 9.2  --   --  8.5  NEUTROABS 6,436 7.0  --   --   --   HGB 10.8* 8.4* 8.3* 8.4* 8.1*  HCT 32.6* 25.4* 24.6* 25.8* 24.5*  MCV 100.3* 101.6*  --   --  100.8*  PLT 172 132*  --   --  124*   Basic Metabolic Panel: Recent Labs  Lab 08/31/21 1322 09/01/21 1004 09/02/21 0445 09/03/21 0516  NA 141 137 137 135  K 4.6 4.0 3.8 3.9  CL 101 101 106 103  CO2 28 25 22 24   GLUCOSE 122* 234* 82 133*  BUN 42* 48* 42* 36*  CREATININE 2.67* 2.85* 2.43* 1.95*  CALCIUM 8.8 7.8* 7.9* 8.0*   GFR: Estimated Creatinine Clearance: 26.3 mL/min (A) (by C-G formula based on SCr of 1.95 mg/dL (H)). Liver Function Tests: Recent Labs  Lab 08/31/21 1322 09/01/21 1004  AST 24 25  ALT 16 13  ALKPHOS  --  111  BILITOT 0.7 0.7  PROT 7.6 6.2*  ALBUMIN  --  3.7   No results for input(s): LIPASE, AMYLASE in the last 168 hours. No results for input(s): AMMONIA in the last 168 hours. Coagulation Profile: Recent Labs  Lab 09/01/21 1004  INR 1.5*   Cardiac Enzymes: No results for input(s): CKTOTAL, CKMB, CKMBINDEX, TROPONINI in the last 168 hours. BNP (last 3 results) No results for input(s): PROBNP in the  last 8760 hours. HbA1C: Recent Labs    09/01/21 1004  HGBA1C 7.6*   CBG: Recent Labs  Lab 09/02/21 1628 09/02/21 2022 09/02/21 2151 09/03/21 0804 09/03/21 1207  GLUCAP 164* 105* 111* 115* 141*   Lipid Profile: No results for input(s): CHOL, HDL, LDLCALC, TRIG, CHOLHDL, LDLDIRECT in the last 72 hours. Thyroid Function Tests: No results for input(s): TSH, T4TOTAL, FREET4, T3FREE, THYROIDAB in the last 72 hours. Anemia Panel: No results for input(s): VITAMINB12, FOLATE, FERRITIN, TIBC, IRON, RETICCTPCT in the last 72 hours. Urine analysis:    Component Value Date/Time   COLORURINE RED (A) 09/01/2021 1004   APPEARANCEUR TURBID (A) 09/01/2021 1004   LABSPEC 1.020 09/01/2021  [place]  09/01/2021 1004    TEST NOT REPORTED DUE TO COLOR INTERFERENCE OF URINE PIGMENT   GLUCOSEU (A) 09/01/2021 1004    TEST NOT REPORTED DUE TO COLOR INTERFERENCE OF URINE PIGMENT   HGBUR (A) 09/01/2021 1004    TEST NOT REPORTED DUE TO COLOR INTERFERENCE OF URINE PIGMENT   BILIRUBINUR (A) 09/01/2021 1004    TEST NOT REPORTED DUE TO COLOR INTERFERENCE OF URINE PIGMENT   BILIRUBINUR Negative 08/29/2021 1303   KETONESUR (A) 09/01/2021 1004    TEST NOT REPORTED DUE TO COLOR INTERFERENCE OF URINE PIGMENT   PROTEINUR (A) 09/01/2021 1004    TEST NOT REPORTED DUE TO COLOR INTERFERENCE OF URINE PIGMENT   UROBILINOGEN 0.2 08/29/2021 1303   NITRITE (A) 09/01/2021 1004    TEST NOT REPORTED DUE TO COLOR INTERFERENCE OF URINE PIGMENT   LEUKOCYTESUR (A) 09/01/2021 1004    TEST NOT REPORTED DUE TO COLOR INTERFERENCE OF URINE PIGMENT   Sepsis Labs: @LABRCNTIP (procalcitonin:4,lacticidven:4)  ) Recent Results (from the past 240 hour(s))  Urine Culture     Status: None   Collection Time: 09/01/21 10:04 AM   Specimen: Urine, Clean Catch  Result Value Ref Range Status   Specimen Description   Final    URINE, CLEAN CATCH Performed at [facility], [address], [place], [place] [address]     Special Requests   Final    NONE Performed at [facility], [address] [place], [place] [address]    Culture   Final    NO GROWTH Performed at [facility], [place] [address], [place], [place] [address]    Report Status 09/03/2021 FINAL  Final  Blood culture (routine x 2)     Status: None (Preliminary result)   Collection Time: 09/01/21 11:08 AM   Specimen: BLOOD  Result Value Ref Range Status   Specimen Description BLOOD  [facility]  Final   Special Requests   Final    BOTTLES DRAWN AEROBIC AND ANAEROBIC Blood Culture results may not be optimal due to an inadequate volume of blood received in culture bottles   Culture   Final    NO GROWTH 2 DAYS Performed at [facility], [address], [place], [place] [address]    Report Status PENDING  Incomplete  Blood culture (routine x 2)     Status: None (Preliminary result)   Collection Time: 09/01/21 11:08 AM   Specimen: BLOOD  Result Value Ref Range Status   Specimen Description BLOOD RIGHT ANTECUBITAL  Final   Special Requests   Final    BOTTLES DRAWN AEROBIC AND ANAEROBIC Blood Culture adequate volume   Culture   Final    NO GROWTH 2 DAYS Performed at [facility], [address] [place], [place] [address]    Report Status PENDING  Incomplete  Resp Panel by RT-PCR (Flu A&B, Covid) Nasopharyngeal Swab     Status: None   Collection Time: 09/01/21 12:53 PM   Specimen: Nasopharyngeal Swab; Nasopharyngeal(NP) swabs in vial transport medium  Result Value Ref Range Status   SARS Coronavirus 2 by RT PCR NEGATIVE NEGATIVE Final    Comment: (NOTE) SARS-CoV-2 target nucleic acids are NOT DETECTED.  The SARS-CoV-2 RNA is generally detectable in upper respiratory specimens during the acute phase of infection. The lowest concentration of SARS-CoV-2 viral copies this assay can detect is 138 copies/mL. A negative result does not preclude SARS-Cov-2 infection and should not be used as the sole basis  for  treatment or other patient management decisions. A negative result may occur with  improper specimen collection/handling, submission of specimen other than nasopharyngeal swab, presence of viral mutation(s) within the areas targeted by this assay, and inadequate number of viral copies(<138 copies/mL). A negative result must be combined with clinical observations, patient history, and epidemiological information. The expected result is Negative.  Fact Sheet for Patients:  EntrepreneurPulse.com.au  Fact Sheet for Healthcare Providers:  IncredibleEmployment.be  This test is no t yet approved or cleared by the [place] FDA and  has been authorized for detection and/or diagnosis of SARS-CoV-2 by FDA under an Emergency Use Authorization (EUA). This EUA will remain  in effect (meaning this test can be used) for the duration of the COVID-19 declaration under Section 564(b)(1) of the Act, 21 U.S.C.section 360bbb-3(b)(1), unless the authorization is terminated  or revoked sooner.       Influenza A by PCR NEGATIVE NEGATIVE Final   Influenza B by PCR NEGATIVE NEGATIVE Final    Comment: (NOTE) The Xpert Xpress SARS-CoV-2/FLU/RSV plus assay is intended as an aid in the diagnosis of influenza from Nasopharyngeal swab specimens and should not be used as a sole basis for treatment. Nasal washings and aspirates are unacceptable for Xpert Xpress SARS-CoV-2/FLU/RSV testing.  Fact Sheet for Patients: EntrepreneurPulse.com.au  Fact Sheet for Healthcare Providers: IncredibleEmployment.be  This test is not yet approved or cleared by the [place] FDA and has been authorized for detection and/or diagnosis of SARS-CoV-2 by FDA under an Emergency Use Authorization (EUA). This EUA will remain in effect (meaning this test can be used) for the duration of the COVID-19 declaration under Section 564(b)(1) of the Act, 21  U.S.C. section 360bbb-3(b)(1), unless the authorization is terminated or revoked.  Performed at [facility], [address] [place], [place] [address]          Radiology Studies: No results found.      Scheduled Meds:  amiodarone  200 mg Oral BID   Chlorhexidine Gluconate Cloth  6 each Topical Daily   insulin aspart  0-15 Units Subcutaneous TID WC   pantoprazole  40 mg Oral Daily   pravastatin  40 mg Oral q1800   tamsulosin  0.4 mg Oral Daily   vitamin B-12  1,000 mcg Oral Daily   Continuous Infusions:  sodium chloride 50 mL/hr at 09/03/21 0315   cefTRIAXone (ROCEPHIN)  IV 1 g (09/03/21 1020)     LOS: 2 days    Time spent: 25 min    Desma Maxim, MD [facility]   If 7PM-7AM, please contact night-coverage [email or web address] Password [facility] 09/03/2021, 2:13 PM

## 2021-09-04 DIAGNOSIS — E1122 Type 2 diabetes mellitus with diabetic chronic kidney disease: Secondary | ICD-10-CM

## 2021-09-04 DIAGNOSIS — R338 Other retention of urine: Secondary | ICD-10-CM

## 2021-09-04 DIAGNOSIS — D62 Acute posthemorrhagic anemia: Secondary | ICD-10-CM

## 2021-09-04 DIAGNOSIS — N184 Chronic kidney disease, stage 4 (severe): Secondary | ICD-10-CM

## 2021-09-04 DIAGNOSIS — N3289 Other specified disorders of bladder: Secondary | ICD-10-CM

## 2021-09-04 DIAGNOSIS — R310 Gross hematuria: Secondary | ICD-10-CM

## 2021-09-04 DIAGNOSIS — I959 Hypotension, unspecified: Secondary | ICD-10-CM

## 2021-09-04 DIAGNOSIS — R4182 Altered mental status, unspecified: Secondary | ICD-10-CM

## 2021-09-04 LAB — CBC
HCT: 22.5 % — ABNORMAL LOW (ref 39.0–52.0)
Hemoglobin: 7.3 g/dL — ABNORMAL LOW (ref 13.0–17.0)
MCH: 32.4 pg (ref 26.0–34.0)
MCHC: 32.4 g/dL (ref 30.0–36.0)
MCV: 100 fL (ref 80.0–100.0)
Platelets: 137 10*3/uL — ABNORMAL LOW (ref 150–400)
RBC: 2.25 MIL/uL — ABNORMAL LOW (ref 4.22–5.81)
RDW: 13.9 % (ref 11.5–15.5)
WBC: 7.2 10*3/uL (ref 4.0–10.5)
nRBC: 0 % (ref 0.0–0.2)

## 2021-09-04 LAB — BASIC METABOLIC PANEL
Anion gap: 8 (ref 5–15)
BUN: 28 mg/dL — ABNORMAL HIGH (ref 8–23)
CO2: 27 mmol/L (ref 22–32)
Calcium: 8 mg/dL — ABNORMAL LOW (ref 8.9–10.3)
Chloride: 101 mmol/L (ref 98–111)
Creatinine, Ser: 1.66 mg/dL — ABNORMAL HIGH (ref 0.61–1.24)
GFR, Estimated: 40 mL/min — ABNORMAL LOW (ref >60)
Glucose, Bld: 125 mg/dL — ABNORMAL HIGH (ref 70–99)
Potassium: 4 mmol/L (ref 3.5–5.1)
Sodium: 136 mmol/L (ref 135–145)

## 2021-09-04 LAB — GLUCOSE, CAPILLARY
Glucose-Capillary: 123 mg/dL — ABNORMAL HIGH (ref 70–99)
Glucose-Capillary: 197 mg/dL — ABNORMAL HIGH (ref 70–99)
Glucose-Capillary: 137 mg/dL — ABNORMAL HIGH (ref 70–99)
Glucose-Capillary: 206 mg/dL — ABNORMAL HIGH (ref 70–99)

## 2021-09-04 LAB — PREPARE RBC (CROSSMATCH)

## 2021-09-04 MED ORDER — SODIUM CHLORIDE 0.9% IV SOLUTION: Freq: Once | INTRAVENOUS | Status: AC

## 2021-09-04 MED ORDER — ACETAMINOPHEN 500 MG PO TABS
1000.0000 mg | ORAL_TABLET | Freq: Four times a day (QID) | ORAL | Status: DC | PRN
  Administered 2021-09-04 – 2021-09-10 (×10): 1000 mg via ORAL
  Filled 2021-09-04 (×10): qty 2

## 2021-09-04 NOTE — TOC Initial Note (Signed)
Transition of Care [facility]) - Initial/Assessment Note    Patient Details  Name: Michael Simon MRN: [id number] Date of Birth: Aug 29, 1933  Transition of [facility] CM/SW Contact:    Candie Chroman, LCSW Phone Number: 09/04/2021, 1:54 PM  Clinical Narrative:  Readmission prevention screen complete. Patient not fully oriented. Daughter at bedside. CSW introduced role and explained that discharge planning would be discussed. Patient lives home with his other daughter. The one at bedside lives across the street. PCP is [name], MD. His children drive him to appointments. [facility] [place]. No issues obtaining medications. No home health prior to admission. Notified them of home health recommendation from PT. Daughter agreeable. No agency preference. Patient worked with [employer] earlier this year. Left voicemail on intake line. Daughter also interested in an aide or OT to assist with bathing. Patient uses a single-point cane at home. Daughter asked about getting a walker. Sent secure chat to PT to confirm it is okay to order one for him. No further concerns. CSW encouraged patient's daughter to contact CSW as needed. CSW will continue to follow patient and his daughter for support and facilitate return home when stable.                Expected Discharge Plan: [place] Barriers to Discharge: Continued Medical Work up   Patient Goals and CMS Choice        Expected Discharge Plan and Services Expected Discharge Plan: [place] Choice: [place] arrangements for the past 2 months: Single Family Home                                      Prior Living Arrangements/Services Living arrangements for the past 2 months: Single Family Home Lives with:: Adult Children Patient language and need for interpreter reviewed:: Yes Do you feel safe going back to the place where you live?: Yes      Need for Family  Participation in Patient Care: Yes (Comment) Care giver support system in place?: Yes (comment) Current home services: DME Criminal Activity/Legal Involvement Pertinent to Current Situation/Hospitalization: No - Comment as needed  Activities of Daily Living Home Assistive Devices/Equipment: Cane (specify quad or straight) ADL Screening (condition at time of admission) Patient's cognitive ability adequate to safely complete daily activities?: Yes Is the patient deaf or have difficulty hearing?: Yes Does the patient have difficulty seeing, even when wearing glasses/contacts?: No Does the patient have difficulty concentrating, remembering, or making decisions?: No Patient able to express need for assistance with ADLs?: Yes Does the patient have difficulty dressing or bathing?: No Independently performs ADLs?: Yes (appropriate for developmental age) Does the patient have difficulty walking or climbing stairs?: Yes Weakness of Legs: None Weakness of Arms/Hands: None  Permission Sought/Granted Permission sought to share information with : Facility Sport and exercise psychologist, Family Supports          Permission granted to share info w Relationship: Daughter     Emotional Assessment Appearance:: Appears stated age Attitude/Demeanor/Rapport: Unable to Assess Affect (typically observed): Unable to Assess Orientation: : Oriented to Self, Oriented to Place Alcohol / Substance Use: Not Applicable Psych Involvement: No (comment)  Admission diagnosis:  Blood loss anemia [D50.0] Acute blood loss anemia [D62] Acute cystitis with hematuria [N30.01] Acute blood loss anemia (ABLA) [D62] Patient Active Problem List  Diagnosis Date Noted   Acute blood loss anemia 09/01/2021   CKD stage 4 due to type 2 diabetes mellitus ([place]) 09/01/2021   Hematuria, gross 09/01/2021   Acute lower UTI 09/01/2021   Acute blood loss anemia (ABLA) 09/01/2021   Prostatitis syndrome 08/31/2021   Urgency of urination  08/22/2021   Basal cell carcinoma (BCC) of skin of nose 02/15/2021   Dyspnea 01/11/2021   Diverticulitis    Hypoxia    Acute respiratory failure with hypoxia ([place]) 01/05/2021   Anemia 01/05/2021   AKI (acute kidney injury) ([place]) 01/05/2021   Acute diverticulitis 01/04/2021   Atrial flutter ([place]) 01/02/2021   Bronchitis 12/21/2020   Suspected COVID-19 virus infection 12/21/2020   Annual physical exam 12/07/2020   Paronychia of great toe, right 04/23/2020   Fever 05/30/2018   Weakness 02/02/2018   Abscess of right earlobe 02/02/2018   Generalized weakness 02/02/2018   Hematochezia 07/26/2015   Diabetes mellitus ([place]) 07/26/2015   Essential hypertension 07/26/2015   HLD (hyperlipidemia) 07/26/2015   Coronary artery disease involving native heart without angina pectoris 07/26/2015   PCP:  [name], MD Pharmacy[facility]   [id number] [place] [place] - [address] [place] [address] Phone: [phone] Fax: [phone]     Social Determinants of Health (SDOH) Interventions    Readmission Risk Interventions Readmission Risk Prevention Plan 09/04/2021  Transportation Screening Complete  PCP or Specialist Appt within 3-5 Days Complete  HRI or Home Care Consult Complete  Social Work Consult for [place] Planning/Counseling Complete  Palliative Care Screening Not Applicable  Medication Review Press photographer) Complete  Some recent data might be hidden

## 2021-09-04 NOTE — Plan of Care (Signed)

## 2021-09-04 NOTE — Progress Notes (Signed)
Subjective: Patient reports no complaints. He's worried about how much the hospital stay will cost. Bladder US yesterday showed a nondistended bladder, foley and minimal clot. CT a/p did not show significant BPH or upper tract lesion in Jan 2022. Urology was asked to reevaluate given drop in his hemoglobin to 7.3.  Objective: Vital signs in last 24 hours: Temp:  [97.8 F (36.6 C)-98.3 F (36.8 C)] 98 F (36.7 C) (09/18 1156) Pulse Rate:  [41-79] 50 (09/18 1156) Resp:  [14-17] 17 (09/18 1156) BP: (107-120)/(44-93) 107/93 (09/18 1156) SpO2:  [96 %-99 %] 99 % (09/18 1156)  Intake/Output from previous day: 09/17 0701 - 09/18 0700 In: -  Out: 4750 [Urine:4750] Intake/Output this shift: No intake/output data recorded.  Physical Exam:  NAD Resting in bed Alert and interactive Abd- soft, NT GU - foley in place - dark maroon urine with some soft clots/sludge  Procedure: the Foley catheter was irrigated and some small soft clots obtained, but it irrigated to clear/pink rather quickly.  Foley irrigated several more times and observed there does not seem to be significant ongoing bleeding.   Lab Results: Recent Labs    09/02/21 0445 09/03/21 0516 09/04/21 0530  HGB 8.4* 8.1* 7.3*  HCT 25.8* 24.5* 22.5*   BMET Recent Labs    09/03/21 0516 09/04/21 0530  NA 135 136  K 3.9 4.0  CL 103 101  CO2 24 27  GLUCOSE 133* 125*  BUN 36* 28*  CREATININE 1.95* 1.66*  CALCIUM 8.0* 8.0*   No results for input(s): LABPT, INR in the last 72 hours. No results for input(s): LABURIN in the last 72 hours. Results for orders placed or performed during the hospital encounter of 09/01/21  Urine Culture     Status: None   Collection Time: 09/01/21 10:04 AM   Specimen: Urine, Clean Catch  Result Value Ref Range Status   Specimen Description   Final    URINE, CLEAN CATCH Performed at [facility], [address] [place], [place] [address]    Special Requests   Final     NONE Performed at [facility], [address] [place], [place] [address]    Culture   Final    NO GROWTH Performed at [facility], [place] [address], [place], [place] [address]    Report Status 09/03/2021 FINAL  Final  Blood culture (routine x 2)     Status: None (Preliminary result)   Collection Time: 09/01/21 11:08 AM   Specimen: BLOOD  Result Value Ref Range Status   Specimen Description BLOOD  [facility]  Final   Special Requests   Final    BOTTLES DRAWN AEROBIC AND ANAEROBIC Blood Culture results may not be optimal due to an inadequate volume of blood received in culture bottles   Culture   Final    NO GROWTH 3 DAYS Performed at [facility], [address] [place], [place] [address]    Report Status PENDING  Incomplete  Blood culture (routine x 2)     Status: None (Preliminary result)   Collection Time: 09/01/21 11:08 AM   Specimen: BLOOD  Result Value Ref Range Status   Specimen Description BLOOD RIGHT ANTECUBITAL  Final   Special Requests   Final    BOTTLES DRAWN AEROBIC AND ANAEROBIC Blood Culture adequate volume   Culture   Final    NO GROWTH 3 DAYS Performed at [facility], [address] [place], [place] [address]    Report Status  PENDING  Incomplete  Resp Panel by RT-PCR (Flu A&B, Covid) Nasopharyngeal Swab     Status: None   Collection Time: 09/01/21 12:53 PM   Specimen: Nasopharyngeal Swab; Nasopharyngeal(NP) swabs in vial transport medium  Result Value Ref Range Status   SARS Coronavirus 2 by RT PCR NEGATIVE NEGATIVE Final    Comment: (NOTE) SARS-CoV-2 target nucleic acids are NOT DETECTED.  The SARS-CoV-2 RNA is generally detectable in upper respiratory specimens during the acute phase of infection. The lowest concentration of SARS-CoV-2 viral copies this assay can detect is 138 copies/mL. A negative result does not preclude SARS-Cov-2 infection and should not be used as the sole basis for treatment or other patient  management decisions. A negative result may occur with  improper specimen collection/handling, submission of specimen other than nasopharyngeal swab, presence of viral mutation(s) within the areas targeted by this assay, and inadequate number of viral copies(<138 copies/mL). A negative result must be combined with clinical observations, patient history, and epidemiological information. The expected result is Negative.  Fact Sheet for Patients:  EntrepreneurPulse.com.au  Fact Sheet for Healthcare Providers:  IncredibleEmployment.be  This test is no t yet approved or cleared by the [place] FDA and  has been authorized for detection and/or diagnosis of SARS-CoV-2 by FDA under an Emergency Use Authorization (EUA). This EUA will remain  in effect (meaning this test can be used) for the duration of the COVID-19 declaration under Section 564(b)(1) of the Act, 21 U.S.C.section 360bbb-3(b)(1), unless the authorization is terminated  or revoked sooner.       Influenza A by PCR NEGATIVE NEGATIVE Final   Influenza B by PCR NEGATIVE NEGATIVE Final    Comment: (NOTE) The Xpert Xpress SARS-CoV-2/FLU/RSV plus assay is intended as an aid in the diagnosis of influenza from Nasopharyngeal swab specimens and should not be used as a sole basis for treatment. Nasal washings and aspirates are unacceptable for Xpert Xpress SARS-CoV-2/FLU/RSV testing.  Fact Sheet for Patients: EntrepreneurPulse.com.au  Fact Sheet for Healthcare Providers: IncredibleEmployment.be  This test is not yet approved or cleared by the [place] FDA and has been authorized for detection and/or diagnosis of SARS-CoV-2 by FDA under an Emergency Use Authorization (EUA). This EUA will remain in effect (meaning this test can be used) for the duration of the COVID-19 declaration under Section 564(b)(1) of the Act, 21 U.S.C. section 360bbb-3(b)(1),  unless the authorization is terminated or revoked.  Performed at [facility], [address] [place], [place] [address]     Studies/Results: US PELVIS LIMITED (TRANSABDOMINAL ONLY)  Result Date: 09/03/2021 CLINICAL DATA:  Hematuria after traumatic Foley placement. EXAM: LIMITED ULTRASOUND OF PELVIS TECHNIQUE: Limited transabdominal ultrasound examination of the pelvis was performed. COMPARISON:  None. FINDINGS: Echogenic debris is seen within the thick-walled bladder. The Foley catheter is noted in the bladder. IMPRESSION: Echogenic debris in the thick-walled bladder may be due to blood products given the history. Electronically Signed   By: Dorise Bullion III M.D.   On: 09/03/2021 16:43    Assessment/Plan: 87 year old who presented to our office on 08/31/2021 in urinary retention.  Foley was placed for a PVR of 900 cc.  He then presented to the ED 09/01/2021 for gross hematuria, hypotension, mental status change and AKI.    Gross hematuria - likely from hemorraghic cystitis from bladder distention. Urine cx no growth. Good UOP. Cr improving each day.  He does not appear to have significant ongoing bleeding.  Urinary retention - continue tamsulosin for voiding  trial in the future.    LOS: 3 days   Festus Aloe 09/04/2021, 12:20 PM

## 2021-09-04 NOTE — Progress Notes (Signed)
PROGRESS NOTE    Michael Simon  NTI:[id number] DOB: [birth year] DOA: 09/01/2021 PCP: [name], MD  Outpatient Specialists: urology    Brief Narrative:   From admission hpi: Michael Simon is a 87 y.o. male with medical history significant for chronic urinary retention status post insertion of Foley catheter on 08/31/21, diabetes mellitus, hypertension, chronic A. fib on anticoagulation therapy who presents to the ER via EMS for evaluation of mental status changes. Patient's son states that he had seen neurology 1 day prior to his admission for symptoms related to chronic urinary retention and had a Foley catheter placed.  He states that the Foley catheter insertion was difficult and multiple attempts were made prior to getting it in.  He states that patient had blood in the bag after the procedure and continued bleeding even after they went home. On the morning of his admission patient was found unresponsive in his chair and so EMS was called.  He was hypotensive with blood pressure of 60/30 and received IV fluids via EMS.  At the time of my evaluation he is awake and alert blood pressure has improved. He denies having any abdominal pain, no fever, no chills, no changes in his bowel habits, no melena stools, no hematochezia, no headache, no leg swelling, no blurred vision, no palpitations, no diaphoresis, no difficulty swallowing, no cough.   Assessment & Plan:   Principal Problem:   Acute blood loss anemia Active Problems:   Diabetes mellitus (HCC)   Essential hypertension   Generalized weakness   Atrial flutter (HCC)   CKD stage 4 due to type 2 diabetes mellitus (HCC)   Hematuria, gross   Acute lower UTI   Acute blood loss anemia (ABLA)  # BPH with obstruction and urinary retention Foley placed 9/14, traumatic - maintain foley, TOV scheduled w/ urology as outpatient - cont flomax - stopped ceftriaxone, urine culture is neg  # Acute blood loss anemia secondary  to traumatic Foley Irrigated by urology 9/15, now appears to be clearing. Hgb in 8s from baseline 10, now today 7.3. Discussed w/ dr. Jeffie Pollock yesterday, advised bladder u/s which showed some clot, and irrigation which has been performed several times. Hgb has dropped today, urine remains bloody -i've reached out to urology and requested they evaluate the patient today - transfuse for hgb less than 8 given hx cad, so will order 1 unit today - holding apixaban  # Hypotension # unresponsiveness # History hypertension Likely 2/2 combination of hypovolemia (reduced po, post-obstructive diuresis) and anemia. Resolved - monitor - hold home lasix, lisinopril - PT consulted, advising HH PT  # AKI on ckd3b/4 Baseline cr appears to be around 2, here upper 2s on presentation, 1.66 today, aki likely 2/2 obstruction. Given improving kidney function it is possible antihypertensives also contributing to prior decreased kidney function - monitor  # A flutter # Hx CAD s/p cabg Rate controlled - holding apixaban as above - cont home amio  # T2DM Glucose wnl here - SSI, holdin ghome meds   DVT prophylaxis: SCDs Code Status: full Family Communication: son updated telephonically 9/18  Level of care: Med-Surg Status is: Inpatient  Remains inpatient appropriate because:Inpatient level of care appropriate due to severity of illness  Dispo: The patient is from: Home              Anticipated d/c is to: Home with home health              Patient currently is not medically  stable to d/c.   Difficult to place patient No    Consultants:  urology  Procedures: none  Antimicrobials:  Ceftriaxone 9/15>9/17   Subjective: This morning feeling well, denies pain, hematuria persists  Objective: Vitals:   09/03/21 1705 09/03/21 2031 09/04/21 0305 09/04/21 0728  BP: 109/61 (!) 108/44 120/71 118/85  Pulse: (!) 41 79 71 (!) 54  Resp: 14 14 14 17   Temp: 98.1 F (36.7 C) 98.3 F (36.8 C) 98.3 F  (36.8 C) 97.8 F (36.6 C)  TempSrc:  Oral Oral   SpO2: 97% 97% 98% 96%  Weight:      Height:        Intake/Output Summary (Last 24 hours) at 09/04/2021 1052 Last data filed at 09/04/2021 0300 Gross per 24 hour  Intake --  Output 2750 ml  Net -2750 ml   Filed Weights   09/01/21 1001  Weight: 77 kg    Examination:  General exam: Appears calm and comfortable  Respiratory system: Clear to auscultation save for rales at bases Cardiovascular system: S1 & S2 heard, RRR. No JVD, murmurs, rubs, gallops or clicks. No pedal edema. Gastrointestinal system: Abdomen is nondistended, soft and nontender. No organomegaly or masses felt. Normal bowel sounds heard. Central nervous system: Alert and oriented. No focal neurological deficits. Extremities: Symmetric 5 x 5 power. Skin: No rashes, lesions or ulcers Psychiatry: Judgement and insight appear normal. Mood & affect appropriate.     Data Reviewed: I have personally reviewed following labs and imaging studies  CBC: Recent Labs  Lab 08/31/21 1322 09/01/21 1004 09/01/21 1751 09/02/21 0445 09/03/21 0516 09/04/21 0530  WBC 9.3 9.2  --   --  8.5 7.2  NEUTROABS 6,436 7.0  --   --   --   --   HGB 10.8* 8.4* 8.3* 8.4* 8.1* 7.3*  HCT 32.6* 25.4* 24.6* 25.8* 24.5* 22.5*  MCV 100.3* 101.6*  --   --  100.8* 100.0  PLT 172 132*  --   --  145* 563*   Basic Metabolic Panel: Recent Labs  Lab 08/31/21 1322 09/01/21 1004 09/02/21 0445 09/03/21 0516 09/04/21 0530  NA 141 137 137 135 136  K 4.6 4.0 3.8 3.9 4.0  CL 101 101 106 103 101  CO2 28 25 22 24 27   GLUCOSE 122* 234* 82 133* 125*  BUN 42* 48* 42* 36* 28*  CREATININE 2.67* 2.85* 2.43* 1.95* 1.66*  CALCIUM 8.8 7.8* 7.9* 8.0* 8.0*   GFR: Estimated Creatinine Clearance: 30.9 mL/min (A) (by C-G formula based on SCr of 1.66 mg/dL (H)). Liver Function Tests: Recent Labs  Lab 08/31/21 1322 09/01/21 1004  AST 24 25  ALT 16 13  ALKPHOS  --  111  BILITOT 0.7 0.7  PROT 7.6 6.2*   ALBUMIN  --  3.7   No results for input(s): LIPASE, AMYLASE in the last 168 hours. No results for input(s): AMMONIA in the last 168 hours. Coagulation Profile: Recent Labs  Lab 09/01/21 1004  INR 1.5*   Cardiac Enzymes: No results for input(s): CKTOTAL, CKMB, CKMBINDEX, TROPONINI in the last 168 hours. BNP (last 3 results) No results for input(s): PROBNP in the last 8760 hours. HbA1C: No results for input(s): HGBA1C in the last 72 hours.  CBG: Recent Labs  Lab 09/03/21 0804 09/03/21 1207 09/03/21 1744 09/03/21 2049 09/04/21 0732  GLUCAP 115* 141* 167* 137* 123*   Lipid Profile: No results for input(s): CHOL, HDL, LDLCALC, TRIG, CHOLHDL, LDLDIRECT in the last 72 hours. Thyroid Function Tests:  No results for input(s): TSH, T4TOTAL, FREET4, T3FREE, THYROIDAB in the last 72 hours. Anemia Panel: Recent Labs    09/03/21 0920  VITAMINB12 1,599*   Urine analysis:    Component Value Date/Time   COLORURINE RED (A) 09/01/2021 1004   APPEARANCEUR TURBID (A) 09/01/2021 1004   LABSPEC 1.020 09/01/2021 1004   PHURINE  09/01/2021 1004    TEST NOT REPORTED DUE TO COLOR INTERFERENCE OF URINE PIGMENT   GLUCOSEU (A) 09/01/2021 1004    TEST NOT REPORTED DUE TO COLOR INTERFERENCE OF URINE PIGMENT   HGBUR (A) 09/01/2021 1004    TEST NOT REPORTED DUE TO COLOR INTERFERENCE OF URINE PIGMENT   BILIRUBINUR (A) 09/01/2021 1004    TEST NOT REPORTED DUE TO COLOR INTERFERENCE OF URINE PIGMENT   BILIRUBINUR Negative 08/29/2021 1303   KETONESUR (A) 09/01/2021 1004    TEST NOT REPORTED DUE TO COLOR INTERFERENCE OF URINE PIGMENT   PROTEINUR (A) 09/01/2021 1004    TEST NOT REPORTED DUE TO COLOR INTERFERENCE OF URINE PIGMENT   UROBILINOGEN 0.2 08/29/2021 1303   NITRITE (A) 09/01/2021 1004    TEST NOT REPORTED DUE TO COLOR INTERFERENCE OF URINE PIGMENT   LEUKOCYTESUR (A) 09/01/2021 1004    TEST NOT REPORTED DUE TO COLOR INTERFERENCE OF URINE PIGMENT   Sepsis  Labs: @LABRCNTIP (procalcitonin:4,lacticidven:4)  ) Recent Results (from the past 240 hour(s))  Urine Culture     Status: None   Collection Time: 09/01/21 10:04 AM   Specimen: Urine, Clean Catch  Result Value Ref Range Status   Specimen Description   Final    URINE, CLEAN CATCH Performed at [facility], [address] [place], [place] [address]    Special Requests   Final    NONE Performed at [facility], [address], [place], [place] [address]    Culture   Final    NO GROWTH Performed at [facility], [place] [address], [place], [place] [address]    Report Status 09/03/2021 FINAL  Final  Blood culture (routine x 2)     Status: None (Preliminary result)   Collection Time: 09/01/21 11:08 AM   Specimen: BLOOD  Result Value Ref Range Status   Specimen Description BLOOD  [facility]  Final   Special Requests   Final    BOTTLES DRAWN AEROBIC AND ANAEROBIC Blood Culture results may not be optimal due to an inadequate volume of blood received in culture bottles   Culture   Final    NO GROWTH 3 DAYS Performed at [facility], [address] [place], [place] [address]    Report Status PENDING  Incomplete  Blood culture (routine x 2)     Status: None (Preliminary result)   Collection Time: 09/01/21 11:08 AM   Specimen: BLOOD  Result Value Ref Range Status   Specimen Description BLOOD RIGHT ANTECUBITAL  Final   Special Requests   Final    BOTTLES DRAWN AEROBIC AND ANAEROBIC Blood Culture adequate volume   Culture   Final    NO GROWTH 3 DAYS Performed at [facility], [address], [place], [place] [address]    Report Status PENDING  Incomplete  Resp Panel by RT-PCR (Flu A&B, Covid) Nasopharyngeal Swab     Status: None   Collection Time: 09/01/21 12:53 PM   Specimen: Nasopharyngeal Swab; Nasopharyngeal(NP) swabs in vial transport medium  Result Value Ref Range Status   SARS Coronavirus 2 by RT PCR NEGATIVE NEGATIVE Final    Comment:  (  NOTE) SARS-CoV-2 target nucleic acids are NOT DETECTED.  The SARS-CoV-2 RNA is generally detectable in upper respiratory specimens during the acute phase of infection. The lowest concentration of SARS-CoV-2 viral copies this assay can detect is 138 copies/mL. A negative result does not preclude SARS-Cov-2 infection and should not be used as the sole basis for treatment or other patient management decisions. A negative result may occur with  improper specimen collection/handling, submission of specimen other than nasopharyngeal swab, presence of viral mutation(s) within the areas targeted by this assay, and inadequate number of viral copies(<138 copies/mL). A negative result must be combined with clinical observations, patient history, and epidemiological information. The expected result is Negative.  Fact Sheet for Patients:  EntrepreneurPulse.com.au  Fact Sheet for Healthcare Providers:  IncredibleEmployment.be  This test is no t yet approved or cleared by the [place] FDA and  has been authorized for detection and/or diagnosis of SARS-CoV-2 by FDA under an Emergency Use Authorization (EUA). This EUA will remain  in effect (meaning this test can be used) for the duration of the COVID-19 declaration under Section 564(b)(1) of the Act, 21 U.S.C.section 360bbb-3(b)(1), unless the authorization is terminated  or revoked sooner.       Influenza A by PCR NEGATIVE NEGATIVE Final   Influenza B by PCR NEGATIVE NEGATIVE Final    Comment: (NOTE) The Xpert Xpress SARS-CoV-2/FLU/RSV plus assay is intended as an aid in the diagnosis of influenza from Nasopharyngeal swab specimens and should not be used as a sole basis for treatment. Nasal washings and aspirates are unacceptable for Xpert Xpress SARS-CoV-2/FLU/RSV testing.  Fact Sheet for Patients: EntrepreneurPulse.com.au  Fact Sheet for Healthcare  Providers: IncredibleEmployment.be  This test is not yet approved or cleared by the [place] FDA and has been authorized for detection and/or diagnosis of SARS-CoV-2 by FDA under an Emergency Use Authorization (EUA). This EUA will remain in effect (meaning this test can be used) for the duration of the COVID-19 declaration under Section 564(b)(1) of the Act, 21 U.S.C. section 360bbb-3(b)(1), unless the authorization is terminated or revoked.  Performed at [facility], [address], [place], [place] [address]          Radiology Studies: US PELVIS LIMITED (TRANSABDOMINAL ONLY)  Result Date: 09/03/2021 CLINICAL DATA:  Hematuria after traumatic Foley placement. EXAM: LIMITED ULTRASOUND OF PELVIS TECHNIQUE: Limited transabdominal ultrasound examination of the pelvis was performed. COMPARISON:  None. FINDINGS: Echogenic debris is seen within the thick-walled bladder. The Foley catheter is noted in the bladder. IMPRESSION: Echogenic debris in the thick-walled bladder may be due to blood products given the history. Electronically Signed   By: Dorise Bullion III M.D.   On: 09/03/2021 16:43        Scheduled Meds:  amiodarone  200 mg Oral BID   Chlorhexidine Gluconate Cloth  6 each Topical Daily   insulin aspart  0-15 Units Subcutaneous TID WC   pantoprazole  40 mg Oral Daily   pravastatin  40 mg Oral q1800   tamsulosin  0.4 mg Oral Daily   vitamin B-12  1,000 mcg Oral Daily   Continuous Infusions:     LOS: 3 days    Time spent: 25 min    Desma Maxim, MD [facility]   If 7PM-7AM, please contact night-coverage [email or web address] Password [facility] 09/04/2021, 10:52 AM

## 2021-09-05 ENCOUNTER — Ambulatory Visit: Payer: Medicare HMO | Admitting: Internal Medicine

## 2021-09-05 DIAGNOSIS — R338 Other retention of urine: Secondary | ICD-10-CM | POA: Diagnosis not present

## 2021-09-05 DIAGNOSIS — I959 Hypotension, unspecified: Secondary | ICD-10-CM | POA: Diagnosis not present

## 2021-09-05 DIAGNOSIS — R310 Gross hematuria: Secondary | ICD-10-CM | POA: Diagnosis not present

## 2021-09-05 DIAGNOSIS — D62 Acute posthemorrhagic anemia: Secondary | ICD-10-CM | POA: Diagnosis not present

## 2021-09-05 LAB — TYPE AND SCREEN
ABO/RH(D): O POS
Antibody Screen: NEGATIVE
Unit division: 0

## 2021-09-05 LAB — BASIC METABOLIC PANEL
Anion gap: 5 (ref 5–15)
BUN: 28 mg/dL — ABNORMAL HIGH (ref 8–23)
CO2: 27 mmol/L (ref 22–32)
Calcium: 8.2 mg/dL — ABNORMAL LOW (ref 8.9–10.3)
Chloride: 103 mmol/L (ref 98–111)
Creatinine, Ser: 1.83 mg/dL — ABNORMAL HIGH (ref 0.61–1.24)
GFR, Estimated: 35 mL/min — ABNORMAL LOW (ref >60)
Glucose, Bld: 135 mg/dL — ABNORMAL HIGH (ref 70–99)
Potassium: 4.1 mmol/L (ref 3.5–5.1)
Sodium: 135 mmol/L (ref 135–145)

## 2021-09-05 LAB — CBC
HCT: 23.8 % — ABNORMAL LOW (ref 39.0–52.0)
Hemoglobin: 8.2 g/dL — ABNORMAL LOW (ref 13.0–17.0)
MCH: 34 pg (ref 26.0–34.0)
MCHC: 34.5 g/dL (ref 30.0–36.0)
MCV: 98.8 fL (ref 80.0–100.0)
Platelets: 136 10*3/uL — ABNORMAL LOW (ref 150–400)
RBC: 2.41 MIL/uL — ABNORMAL LOW (ref 4.22–5.81)
RDW: 14.9 % (ref 11.5–15.5)
WBC: 7.8 10*3/uL (ref 4.0–10.5)
nRBC: 0 % (ref 0.0–0.2)

## 2021-09-05 LAB — GLUCOSE, CAPILLARY
Glucose-Capillary: 124 mg/dL — ABNORMAL HIGH (ref 70–99)
Glucose-Capillary: 147 mg/dL — ABNORMAL HIGH (ref 70–99)
Glucose-Capillary: 119 mg/dL — ABNORMAL HIGH (ref 70–99)

## 2021-09-05 LAB — BPAM RBC
Blood Product Expiration Date: 202209192359
ISSUE DATE / TIME: 202209181625
Unit Type and Rh: 9500

## 2021-09-05 MED ORDER — SODIUM CHLORIDE 0.9 % IV SOLN: INTRAVENOUS | Status: DC

## 2021-09-05 NOTE — Progress Notes (Addendum)
PROGRESS NOTE    Michael Simon  WYO:[id number] DOB: [birth year] DOA: 09/01/2021 PCP: [name], MD  Outpatient Specialists: urology    Brief Narrative:   From admission hpi: Michael Simon is a 87 y.o. male with medical history significant for chronic urinary retention status post insertion of Foley catheter on 08/31/21, diabetes mellitus, hypertension, chronic A. fib on anticoagulation therapy who presents to the ER via EMS for evaluation of mental status changes. Patient's son states that he had seen neurology 1 day prior to his admission for symptoms related to chronic urinary retention and had a Foley catheter placed.  He states that the Foley catheter insertion was difficult and multiple attempts were made prior to getting it in.  He states that patient had blood in the bag after the procedure and continued bleeding even after they went home. On the morning of his admission patient was found unresponsive in his chair and so EMS was called.  He was hypotensive with blood pressure of 60/30 and received IV fluids via EMS.  At the time of my evaluation he is awake and alert blood pressure has improved. He denies having any abdominal pain, no fever, no chills, no changes in his bowel habits, no melena stools, no hematochezia, no headache, no leg swelling, no blurred vision, no palpitations, no diaphoresis, no difficulty swallowing, no cough.   Assessment & Plan:   Principal Problem:   Acute blood loss anemia Active Problems:   Diabetes mellitus (HCC)   Essential hypertension   Generalized weakness   Atrial flutter (HCC)   CKD stage 4 due to type 2 diabetes mellitus (HCC)   Hematuria, gross   Acute lower UTI   Acute blood loss anemia (ABLA)  # BPH with obstruction and urinary retention Foley placed 9/14, traumatic - maintain foley, TOV scheduled w/ urology as outpatient - cont flomax - stopped ceftriaxone, urine culture is neg  # Acute blood loss anemia secondary  to traumatic Foley Irrigated by urology 9/15, now appears to be clearing. Hgb baseline around 10, here dropped to 7s, transfused 1 unit 9/18 and appropriate increaes today to 8.2. Urology saw yesterday, thought bleeding clearing somewhat. Today continues to have red urine in tube of foley - npo at midnight. If urine hasn't significantly cleared in the AM, urology plans to take back for cystoscopy - holding apixaban for now  # Hypotension # unresponsiveness # History hypertension Likely 2/2 combination of hypovolemia (reduced po, post-obstructive diuresis) and anemia. Resolved though BPs soft - re-start maintenance fluids today @ 100 - holding home lasix, lisinopril - PT consulted, advising HH PT, those orders placed today  # AKI on ckd3b/4 Baseline cr appears to be around 2, here upper 2s on presentation, now stable in upper 1s; aki likely 2/2 obstruction. Given improving kidney function it is possible antihypertensives also contributing to prior decreased kidney function - monitor  # A flutter # Hx CAD s/p cabg Rate controlled - holding apixaban as above - cont home amio  # T2DM Glucose wnl here - hold home meds - daily fasting glucose   DVT prophylaxis: SCDs Code Status: full Family Communication: son updated telephonically 9/18  Level of care: Med-Surg Status is: Inpatient  Remains inpatient appropriate because:Inpatient level of care appropriate due to severity of illness  Dispo: The patient is from: Home              Anticipated d/c is to: Home with home health  Patient currently is not medically stable to d/c.   Difficult to place patient No    Consultants:  urology  Procedures: none  Antimicrobials:  Ceftriaxone 9/15>9/17   Subjective: This morning feeling well, denies pain, hematuria persists  Objective: Vitals:   09/04/21 1759 09/04/21 2038 09/05/21 0400 09/05/21 0806  BP: (!) 112/59 100/66 (!) 117/40 (!) 92/58  Pulse: (!) 52 78 62 65   Resp: 17 16 18 17   Temp: 98.5 F (36.9 C) 98.5 F (36.9 C) 98 F (36.7 C) (!) 97.5 F (36.4 C)  TempSrc: Oral Oral Oral Oral  SpO2: 98% 95% 95% 92%  Weight:      Height:        Intake/Output Summary (Last 24 hours) at 09/05/2021 0947 Last data filed at 09/05/2021 0649 Gross per 24 hour  Intake 660 ml  Output 1650 ml  Net -990 ml   Filed Weights   09/01/21 1001  Weight: 77 kg    Examination:  General exam: Appears calm and comfortable  Respiratory system: Clear to auscultation save for rales at bases Cardiovascular system: S1 & S2 heard, RRR. No JVD, murmurs, rubs, gallops or clicks. No pedal edema. Gastrointestinal system: Abdomen is nondistended, soft and nontender. No organomegaly or masses felt. Normal bowel sounds heard. Central nervous system: Alert and oriented. No focal neurological deficits. Extremities: Symmetric 5 x 5 power. Skin: No rashes, lesions or ulcers Psychiatry: Judgement and insight appear normal. Mood & affect appropriate.     Data Reviewed: I have personally reviewed following labs and imaging studies  CBC: Recent Labs  Lab 08/31/21 1322 09/01/21 1004 09/01/21 1751 09/02/21 0445 09/03/21 0516 09/04/21 0530 09/05/21 0426  WBC 9.3 9.2  --   --  8.5 7.2 7.8  NEUTROABS 6,436 7.0  --   --   --   --   --   HGB 10.8* 8.4* 8.3* 8.4* 8.1* 7.3* 8.2*  HCT 32.6* 25.4* 24.6* 25.8* 24.5* 22.5* 23.8*  MCV 100.3* 101.6*  --   --  100.8* 100.0 98.8  PLT 172 132*  --   --  145* 137* 676*   Basic Metabolic Panel: Recent Labs  Lab 09/01/21 1004 09/02/21 0445 09/03/21 0516 09/04/21 0530 09/05/21 0426  NA 137 137 135 136 135  K 4.0 3.8 3.9 4.0 4.1  CL 101 106 103 101 103  CO2 25 22 24 27 27   GLUCOSE 234* 82 133* 125* 135*  BUN 48* 42* 36* 28* 28*  CREATININE 2.85* 2.43* 1.95* 1.66* 1.83*  CALCIUM 7.8* 7.9* 8.0* 8.0* 8.2*   GFR: Estimated Creatinine Clearance: 28 mL/min (A) (by C-G formula based on SCr of 1.83 mg/dL (H)). Liver Function  Tests: Recent Labs  Lab 08/31/21 1322 09/01/21 1004  AST 24 25  ALT 16 13  ALKPHOS  --  111  BILITOT 0.7 0.7  PROT 7.6 6.2*  ALBUMIN  --  3.7   No results for input(s): LIPASE, AMYLASE in the last 168 hours. No results for input(s): AMMONIA in the last 168 hours. Coagulation Profile: Recent Labs  Lab 09/01/21 1004  INR 1.5*   Cardiac Enzymes: No results for input(s): CKTOTAL, CKMB, CKMBINDEX, TROPONINI in the last 168 hours. BNP (last 3 results) No results for input(s): PROBNP in the last 8760 hours. HbA1C: No results for input(s): HGBA1C in the last 72 hours.  CBG: Recent Labs  Lab 09/04/21 0732 09/04/21 1151 09/04/21 1706 09/04/21 2041 09/05/21 0800  GLUCAP 123* 197* 137* 206* 124*   Lipid Profile:  No results for input(s): CHOL, HDL, LDLCALC, TRIG, CHOLHDL, LDLDIRECT in the last 72 hours. Thyroid Function Tests: No results for input(s): TSH, T4TOTAL, FREET4, T3FREE, THYROIDAB in the last 72 hours. Anemia Panel: Recent Labs    09/03/21 0920  VITAMINB12 1,599*   Urine analysis:    Component Value Date/Time   COLORURINE RED (A) 09/01/2021 1004   APPEARANCEUR TURBID (A) 09/01/2021 1004   LABSPEC 1.020 09/01/2021 1004   PHURINE  09/01/2021 1004    TEST NOT REPORTED DUE TO COLOR INTERFERENCE OF URINE PIGMENT   GLUCOSEU (A) 09/01/2021 1004    TEST NOT REPORTED DUE TO COLOR INTERFERENCE OF URINE PIGMENT   HGBUR (A) 09/01/2021 1004    TEST NOT REPORTED DUE TO COLOR INTERFERENCE OF URINE PIGMENT   BILIRUBINUR (A) 09/01/2021 1004    TEST NOT REPORTED DUE TO COLOR INTERFERENCE OF URINE PIGMENT   BILIRUBINUR Negative 08/29/2021 1303   KETONESUR (A) 09/01/2021 1004    TEST NOT REPORTED DUE TO COLOR INTERFERENCE OF URINE PIGMENT   PROTEINUR (A) 09/01/2021 1004    TEST NOT REPORTED DUE TO COLOR INTERFERENCE OF URINE PIGMENT   UROBILINOGEN 0.2 08/29/2021 1303   NITRITE (A) 09/01/2021 1004    TEST NOT REPORTED DUE TO COLOR INTERFERENCE OF URINE PIGMENT    LEUKOCYTESUR (A) 09/01/2021 1004    TEST NOT REPORTED DUE TO COLOR INTERFERENCE OF URINE PIGMENT   Sepsis Labs: @LABRCNTIP (procalcitonin:4,lacticidven:4)  ) Recent Results (from the past 240 hour(s))  Urine Culture     Status: None   Collection Time: 09/01/21 10:04 AM   Specimen: Urine, Clean Catch  Result Value Ref Range Status   Specimen Description   Final    URINE, CLEAN CATCH Performed at [facility], [address], [place], [place] [address]    Special Requests   Final    NONE Performed at [facility], [address], [place], [place] [address]    Culture   Final    NO GROWTH Performed at [facility], Day [address] [place], [place] [address]    Report Status 09/03/2021 FINAL  Final  Blood culture (routine x 2)     Status: None (Preliminary result)   Collection Time: 09/01/21 11:08 AM   Specimen: BLOOD  Result Value Ref Range Status   Specimen Description BLOOD  [facility]  Final   Special Requests   Final    BOTTLES DRAWN AEROBIC AND ANAEROBIC Blood Culture results may not be optimal due to an inadequate volume of blood received in culture bottles   Culture   Final    NO GROWTH 4 DAYS Performed at [facility], [address], [place], [place] [address]    Report Status PENDING  Incomplete  Blood culture (routine x 2)     Status: None (Preliminary result)   Collection Time: 09/01/21 11:08 AM   Specimen: BLOOD  Result Value Ref Range Status   Specimen Description BLOOD RIGHT ANTECUBITAL  Final   Special Requests   Final    BOTTLES DRAWN AEROBIC AND ANAEROBIC Blood Culture adequate volume   Culture   Final    NO GROWTH 4 DAYS Performed at [facility], [address] [place], [place] [address]    Report Status PENDING  Incomplete  Resp Panel by RT-PCR (Flu A&B, Covid) Nasopharyngeal Swab     Status: None   Collection Time: 09/01/21 12:53 PM   Specimen: Nasopharyngeal Swab; Nasopharyngeal(NP) swabs in vial transport  medium  Result Value  Ref Range Status   SARS Coronavirus 2 by RT PCR NEGATIVE NEGATIVE Final    Comment: (NOTE) SARS-CoV-2 target nucleic acids are NOT DETECTED.  The SARS-CoV-2 RNA is generally detectable in upper respiratory specimens during the acute phase of infection. The lowest concentration of SARS-CoV-2 viral copies this assay can detect is 138 copies/mL. A negative result does not preclude SARS-Cov-2 infection and should not be used as the sole basis for treatment or other patient management decisions. A negative result may occur with  improper specimen collection/handling, submission of specimen other than nasopharyngeal swab, presence of viral mutation(s) within the areas targeted by this assay, and inadequate number of viral copies(<138 copies/mL). A negative result must be combined with clinical observations, patient history, and epidemiological information. The expected result is Negative.  Fact Sheet for Patients:  EntrepreneurPulse.com.au  Fact Sheet for Healthcare Providers:  IncredibleEmployment.be  This test is no t yet approved or cleared by the [place] FDA and  has been authorized for detection and/or diagnosis of SARS-CoV-2 by FDA under an Emergency Use Authorization (EUA). This EUA will remain  in effect (meaning this test can be used) for the duration of the COVID-19 declaration under Section 564(b)(1) of the Act, 21 U.S.C.section 360bbb-3(b)(1), unless the authorization is terminated  or revoked sooner.       Influenza A by PCR NEGATIVE NEGATIVE Final   Influenza B by PCR NEGATIVE NEGATIVE Final    Comment: (NOTE) The Xpert Xpress SARS-CoV-2/FLU/RSV plus assay is intended as an aid in the diagnosis of influenza from Nasopharyngeal swab specimens and should not be used as a sole basis for treatment. Nasal washings and aspirates are unacceptable for Xpert Xpress SARS-CoV-2/FLU/RSV testing.  Fact Sheet for  Patients: EntrepreneurPulse.com.au  Fact Sheet for Healthcare Providers: IncredibleEmployment.be  This test is not yet approved or cleared by the [place] FDA and has been authorized for detection and/or diagnosis of SARS-CoV-2 by FDA under an Emergency Use Authorization (EUA). This EUA will remain in effect (meaning this test can be used) for the duration of the COVID-19 declaration under Section 564(b)(1) of the Act, 21 U.S.C. section 360bbb-3(b)(1), unless the authorization is terminated or revoked.  Performed at [facility], [address], [place], [place] [address]          Radiology Studies: US PELVIS LIMITED (TRANSABDOMINAL ONLY)  Result Date: 09/03/2021 CLINICAL DATA:  Hematuria after traumatic Foley placement. EXAM: LIMITED ULTRASOUND OF PELVIS TECHNIQUE: Limited transabdominal ultrasound examination of the pelvis was performed. COMPARISON:  None. FINDINGS: Echogenic debris is seen within the thick-walled bladder. The Foley catheter is noted in the bladder. IMPRESSION: Echogenic debris in the thick-walled bladder may be due to blood products given the history. Electronically Signed   By: Dorise Bullion III M.D.   On: 09/03/2021 16:43        Scheduled Meds:  amiodarone  200 mg Oral BID   Chlorhexidine Gluconate Cloth  6 each Topical Daily   insulin aspart  0-15 Units Subcutaneous TID WC   pantoprazole  40 mg Oral Daily   pravastatin  40 mg Oral q1800   tamsulosin  0.4 mg Oral Daily   vitamin B-12  1,000 mcg Oral Daily   Continuous Infusions:     LOS: 4 days    Time spent: 25 min    Desma Maxim, MD [facility]   If 7PM-7AM, please contact night-coverage [email or web address] Password [facility] 09/05/2021, 9:47 AM

## 2021-09-05 NOTE — Progress Notes (Signed)
Urology Inpatient Progress Note  Subjective: No acute events overnight. He received 1 unit PRBCs yesterday; hemoglobin has risen to 8.2.  Creatinine stable at 1.83. Foley catheter in place draining maroon urine. He is accompanied today by his son at the bedside.  Patient reports he lives with his daughter, who would not likely be able to perform manual catheter irrigation at home.  No acute concerns today.  He denies pain.  He has been ambulatory.  Anti-infectives: Anti-infectives (From admission, onward)    Start     Dose/Rate Route Frequency Ordered Stop   09/02/21 1100  cefTRIAXone (ROCEPHIN) 2 g in sodium chloride 0.9 % 100 mL IVPB  Status:  Discontinued        2 g 200 mL/hr over 30 Minutes Intravenous Every 24 hours 09/01/21 1224 09/02/21 1004   09/02/21 1100  cefTRIAXone (ROCEPHIN) 1 g in sodium chloride 0.9 % 100 mL IVPB  Status:  Discontinued        1 g 200 mL/hr over 30 Minutes Intravenous Every 24 hours 09/02/21 1004 09/03/21 1414   09/01/21 1045  cefTRIAXone (ROCEPHIN) 2 g in sodium chloride 0.9 % 100 mL IVPB        2 g 200 mL/hr over 30 Minutes Intravenous  Once 09/01/21 1044 09/01/21 1138       Current Facility-Administered Medications  Medication Dose Route Frequency Provider Last Rate Last Admin   0.9 %  sodium chloride infusion   Intravenous Continuous Wouk, Ailene Rud, MD       acetaminophen (TYLENOL) tablet 1,000 mg  1,000 mg Oral Q6H PRN Gwynne Edinger, MD   1,000 mg at 09/05/21 0646   albuterol (PROVENTIL) (2.5 MG/3ML) 0.083% nebulizer solution 2.5 mg  2.5 mg Nebulization Q6H PRN Agbata, Tochukwu, MD       amiodarone (PACERONE) tablet 200 mg  200 mg Oral BID Agbata, Tochukwu, MD   200 mg at 09/05/21 0923   Chlorhexidine Gluconate Cloth 2 % PADS 6 each  6 each Topical Daily Gwynne Edinger, MD   6 each at 09/05/21 0853   ondansetron (ZOFRAN) tablet 4 mg  4 mg Oral Q6H PRN Agbata, Tochukwu, MD       Or   ondansetron (ZOFRAN) injection 4 mg  4 mg  Intravenous Q6H PRN Agbata, Tochukwu, MD       pantoprazole (PROTONIX) EC tablet 40 mg  40 mg Oral Daily Agbata, Tochukwu, MD   40 mg at 09/05/21 0852   pravastatin (PRAVACHOL) tablet 40 mg  40 mg Oral q1800 Agbata, Tochukwu, MD   40 mg at 09/04/21 1840   tamsulosin (FLOMAX) capsule 0.4 mg  0.4 mg Oral Daily Agbata, Tochukwu, MD   0.4 mg at 09/05/21 3007   vitamin B-12 (CYANOCOBALAMIN) tablet 1,000 mcg  1,000 mcg Oral Daily Agbata, Tochukwu, MD   1,000 mcg at 09/05/21 0852   Objective: Vital signs in last 24 hours: Temp:  [97.5 F (36.4 C)-98.8 F (37.1 C)] 97.5 F (36.4 C) (09/19 0806) Pulse Rate:  [52-78] 65 (09/19 0806) Resp:  [16-18] 17 (09/19 0806) BP: (89-117)/(40-66) 92/58 (09/19 0806) SpO2:  [92 %-100 %] 92 % (09/19 0806)  Intake/Output from previous day: 09/18 0701 - 09/19 0700 In: 660 [Blood:660] Out: 6226 [Urine:1650] Intake/Output this shift: No intake/output data recorded.  Physical Exam Vitals and nursing note reviewed.  Constitutional:      General: He is not in acute distress.    Appearance: He is not ill-appearing, toxic-appearing or diaphoretic.  HENT:  Head: Normocephalic and atraumatic.  Pulmonary:     Effort: Pulmonary effort is normal. No respiratory distress.  Skin:    General: Skin is warm and dry.  Neurological:     Mental Status: He is alert and oriented to person, place, and time.  Psychiatric:        Mood and Affect: Mood normal.        Behavior: Behavior normal.    Lab Results:  Recent Labs    09/04/21 0530 09/05/21 0426  WBC 7.2 7.8  HGB 7.3* 8.2*  HCT 22.5* 23.8*  PLT 137* 136*   BMET Recent Labs    09/04/21 0530 09/05/21 0426  NA 136 135  K 4.0 4.1  CL 101 103  CO2 27 27  GLUCOSE 125* 135*  BUN 28* 28*  CREATININE 1.66* 1.83*  CALCIUM 8.0* 8.2*   Assessment & Plan: 87 year old male admitted with gross hematuria, hypotension, AMS, and AKI 1 day after Foley catheter placement in our clinic for urinary retention.   Eliquis remains held, however he has continued to have intermittent gross hematuria and clot formation requiring manual irrigation as well as blood transfusion yesterday.  Worsened gross hematuria noted today.  I irrigated the patient's catheter at the bedside today with 400 cc of sterile saline.  I was able to evacuate approximately 10 cc of clot material from the bladder and his urine rapidly cleared to clear in color.  Catheter irrigated easily upon conclusion of the procedure.  Given his recurrent gross hematuria and clot formation, I am concerned that he is not ready for discharge as he is at high risk for recurrent clot formation and catheter obstruction.  That said, there is no evidence of active bleeding with urine promptly clearing upon manual irrigation, so I do not feel that CBI is necessary at this time.  Recommendations: -Continue Foley catheter and hand irrigate as needed -N.p.o. at midnight -Urology to reassess in the morning.  If his gross hematuria has worsened again, recommend proceeding to the OR for cystoscopy with possible bladder fulguration and possible clot evacuation with Dr. Clance Boll, PA-C 09/05/2021

## 2021-09-05 NOTE — Progress Notes (Addendum)
Physical Therapy Treatment Patient Details Name: Michael Simon MRN: [id number] DOB: 13-May-1933 Today's Date: 09/05/2021   History of Present Illness Pt is an 87 y.o. male with medical history significant for chronic urinary retention status post insertion of Foley catheter on 08/31/21, diabetes mellitus, hypertension, chronic A. fib on anticoagulation therapy who presents to the ER via EMS for evaluation of mental status changes.    PT Comments    Pt alert, pleasant and cooperative throughout treatment.  Pt initially denied OOB mobility but agreed with gentle encouragement. Pt is disoriented to year, and day of week but is oriented to name, DOB, and family member names, & situation. Bed mobility requires MOD A for trunk, BLE assist & verbal cues HOB elevated. Transfers w/ MIN GUARD for safety and cues for hand placement with RW. By end of session pt demonstrated good technique sit > stand w/ pushing off BUE from chair and good control. Pt still requires cues for stand > sit for hand placement in order to control descent. Ambulated w/ RW, MIN GUARD w/ 2 seated rests from fatigue. Cues for keeping COM under BOS necessary for safety. Skilled PT intervention is indicated to address deficits in function, mobility, and to return to PLOF as able.  Discharge recommendations remain HHPT.   Recommendations for follow up therapy are one component of a multi-disciplinary discharge planning process, led by the attending physician.  Recommendations may be updated based on patient status, additional functional criteria and insurance authorization.  Follow Up Recommendations  Home health PT;Supervision for mobility/OOB     Equipment Recommendations  Rolling walker with 5" wheels    Recommendations for Other Services       Precautions / Restrictions Precautions Precautions: Fall Restrictions Weight Bearing Restrictions: No     Mobility  Bed Mobility Overal bed mobility: Needs Assistance Bed  Mobility: Supine to Sit     Supine to sit: Mod assist     General bed mobility comments: trunk and BLE assist with pt providing 50% effort    Transfers Overall transfer level: Needs assistance Equipment used: Rolling walker (2 wheeled);None Transfers: Sit to/from Stand Sit to Stand: Min guard         General transfer comment: able to push off chair w/ BUE prior to grabing walker with good control, cues for hand placement when reaching for RW  Ambulation/Gait Ambulation/Gait assistance: Min guard Gait Distance (Feet): 200 Feet Assistive device: Rolling walker (2 wheeled) Gait Pattern/deviations: Trunk flexed;Narrow base of support;Decreased step length - right;Decreased step length - left Gait velocity: decreased   General Gait Details: R leaning, cues for staying within RW and reducing hip and trunk flexion to maintain COM over BOS; pt required 2x seated rest breaks for fatigue, HR 101   Stairs             Wheelchair Mobility    Modified Rankin (Stroke Patients Only)       Balance Overall balance assessment: Needs assistance   Sitting balance-Leahy Scale: Good     Standing balance support: During functional activity;Bilateral upper extremity supported Standing balance-Leahy Scale: Fair Standing balance comment: requires BUE support for dynamic tasks                            Cognition Arousal/Alertness: Awake/alert Behavior During Therapy: WFL for tasks assessed/performed Overall Cognitive Status: No family/caregiver present to determine baseline cognitive functioning  General Comments: pt is oriented to self, DOB,and family member names and some past history (death of his wife); disoriented to year, day of week      Exercises Other Exercises Other Exercises: Vital assessment due to hypotension (supine) 94/57, 66, 100% (seated) 103/65, 72, 96%    General Comments        Pertinent  Vitals/Pain Pain Assessment: No/denies pain    Home Living                      Prior Function            PT Goals (current goals can now be found in the care plan section) Progress towards PT goals: Progressing toward goals    Frequency    Min 2X/week      PT Plan Current plan remains appropriate    Co-evaluation              AM-PAC PT "6 Clicks" Mobility   Outcome Measure  Help needed turning from your back to your side while in a flat bed without using bedrails?: A Little Help needed moving from lying on your back to sitting on the side of a flat bed without using bedrails?: A Little Help needed moving to and from a bed to a chair (including a wheelchair)?: A Little Help needed standing up from a chair using your arms (e.g., wheelchair or bedside chair)?: A Little Help needed to walk in hospital room?: A Little Help needed climbing 3-5 steps with a railing? : A Little 6 Click Score: 18    End of Session Equipment Utilized During Treatment: Gait belt Activity Tolerance: Patient tolerated treatment well Patient left: in chair;with call bell/phone within reach;with chair alarm set Nurse Communication: Mobility status PT Visit Diagnosis: Other abnormalities of gait and mobility (R26.89);Difficulty in walking, not elsewhere classified (R26.2)     Time: 5638-9373 PT Time Calculation (min) (ACUTE ONLY): 46 min  Charges:                       [employer], SPT

## 2021-09-05 NOTE — Care Management Important Message (Signed)
Important Message  Patient Details  Name: Michael Simon MRN: [id number] Date of Birth: 1933-04-09   Medicare Important Message Given:  Yes     Dannette Barbara 09/05/2021, 12:17 PM

## 2021-09-05 NOTE — TOC Progression Note (Signed)
Transition of Care [employer]) - Progression Note    Patient Details  Name: Michael Simon MRN: [id number] Date of Birth: 09-07-1933  Transition of Care [facility]) CM/SW Contact  Su Hilt, RN Phone Number: 09/05/2021, 9:48 AM  Clinical Narrative:   Spoke with the Daughter Janann Colonel on the phone, she is going to check to see if he has a walker at home, she thinks he might, she will call me back to let me know, The patient has used [facility] in the past, I reached out to Judson Roch to confirm that they will accept the patient again,     Expected Discharge Plan: [place] Barriers to Discharge: Continued Medical Work up  Expected Discharge Plan and Services Expected Discharge Plan: [place] arrangements for the past 2 months: Single Family Home                                       Social Determinants of Health (SDOH) Interventions    Readmission Risk Interventions Readmission Risk Prevention Plan 09/04/2021  Transportation Screening Complete  PCP or Specialist Appt within 3-5 Days Complete  HRI or Home Care Consult Complete  Social Work Consult for [place] Planning/Counseling Complete  Palliative Care Screening Not Applicable  Medication Review [employer]) Complete  Some recent data might be hidden

## 2021-09-06 ENCOUNTER — Encounter: Payer: Self-pay | Admitting: Internal Medicine

## 2021-09-06 ENCOUNTER — Inpatient Hospital Stay: Payer: Medicare HMO | Admitting: Anesthesiology

## 2021-09-06 ENCOUNTER — Encounter
Admission: EM | Disposition: A | Discharge status: Home Health | Payer: Self-pay | Source: Home / Self Care | Attending: Obstetrics and Gynecology

## 2021-09-06 DIAGNOSIS — R338 Other retention of urine: Secondary | ICD-10-CM

## 2021-09-06 DIAGNOSIS — R310 Gross hematuria: Secondary | ICD-10-CM

## 2021-09-06 DIAGNOSIS — D62 Acute posthemorrhagic anemia: Secondary | ICD-10-CM | POA: Diagnosis not present

## 2021-09-06 HISTORY — PX: TRANSURETHRAL RESECTION OF BLADDER TUMOR: SHX2575

## 2021-09-06 HISTORY — PX: CYSTOSCOPY WITH FULGERATION: SHX6638

## 2021-09-06 LAB — CBC
HCT: 22 % — ABNORMAL LOW (ref 39.0–52.0)
Hemoglobin: 7.2 g/dL — ABNORMAL LOW (ref 13.0–17.0)
MCH: 33.5 pg (ref 26.0–34.0)
MCHC: 32.7 g/dL (ref 30.0–36.0)
MCV: 102.3 fL — ABNORMAL HIGH (ref 80.0–100.0)
Platelets: 133 10*3/uL — ABNORMAL LOW (ref 150–400)
RBC: 2.15 MIL/uL — ABNORMAL LOW (ref 4.22–5.81)
RDW: 14.6 % (ref 11.5–15.5)
WBC: 7.3 10*3/uL (ref 4.0–10.5)
nRBC: 0 % (ref 0.0–0.2)

## 2021-09-06 LAB — GLUCOSE, CAPILLARY
Glucose-Capillary: 118 mg/dL — ABNORMAL HIGH (ref 70–99)
Glucose-Capillary: 122 mg/dL — ABNORMAL HIGH (ref 70–99)

## 2021-09-06 LAB — CULTURE, BLOOD (ROUTINE X 2)
Culture: NO GROWTH
Culture: NO GROWTH
Special Requests: ADEQUATE

## 2021-09-06 LAB — BASIC METABOLIC PANEL
Anion gap: 6 (ref 5–15)
BUN: 25 mg/dL — ABNORMAL HIGH (ref 8–23)
CO2: 25 mmol/L (ref 22–32)
Calcium: 7.7 mg/dL — ABNORMAL LOW (ref 8.9–10.3)
Chloride: 106 mmol/L (ref 98–111)
Creatinine, Ser: 1.72 mg/dL — ABNORMAL HIGH (ref 0.61–1.24)
GFR, Estimated: 38 mL/min — ABNORMAL LOW (ref >60)
Glucose, Bld: 108 mg/dL — ABNORMAL HIGH (ref 70–99)
Potassium: 4 mmol/L (ref 3.5–5.1)
Sodium: 137 mmol/L (ref 135–145)

## 2021-09-06 LAB — PREPARE RBC (CROSSMATCH)

## 2021-09-06 SURGERY — CYSTOSCOPY, WITH BLADDER FULGURATION
Anesthesia: General

## 2021-09-06 MED ORDER — DEXAMETHASONE SODIUM PHOSPHATE 10 MG/ML IJ SOLN
INTRAMUSCULAR | Status: DC | PRN
  Administered 2021-09-06: 10 mg via INTRAVENOUS

## 2021-09-06 MED ORDER — CEFAZOLIN (ANCEF) 1 G IV SOLR: 2.0000 g | INTRAVENOUS | Status: DC

## 2021-09-06 MED ORDER — PHENYLEPHRINE HCL (PRESSORS) 10 MG/ML IV SOLN
INTRAVENOUS | Status: DC | PRN
  Administered 2021-09-06: 200 ug via INTRAVENOUS
  Administered 2021-09-06 (×3): 100 ug via INTRAVENOUS

## 2021-09-06 MED ORDER — SODIUM CHLORIDE 0.9% IV SOLUTION: Freq: Once | INTRAVENOUS | Status: DC

## 2021-09-06 MED ORDER — CEFAZOLIN SODIUM-DEXTROSE 2-4 GM/100ML-% IV SOLN
2.0000 g | INTRAVENOUS | Status: AC
  Administered 2021-09-06: 2 g via INTRAVENOUS

## 2021-09-06 MED ORDER — ONDANSETRON HCL 4 MG/2ML IJ SOLN: 4.0000 mg | Freq: Once | INTRAMUSCULAR | Status: DC | PRN

## 2021-09-06 MED ORDER — SODIUM CHLORIDE 0.9 % IR SOLN
3000.0000 mL | Status: DC
  Administered 2021-09-06 – 2021-09-07 (×2): 3000 mL

## 2021-09-06 MED ORDER — DEXAMETHASONE SODIUM PHOSPHATE 10 MG/ML IJ SOLN
INTRAMUSCULAR | Status: AC
  Filled 2021-09-06: qty 1

## 2021-09-06 MED ORDER — VASOPRESSIN 20 UNIT/ML IV SOLN
INTRAVENOUS | Status: DC | PRN
  Administered 2021-09-06 (×2): 1 [IU] via INTRAVENOUS

## 2021-09-06 MED ORDER — LACTATED RINGERS IV SOLN: INTRAVENOUS | Status: DC | PRN

## 2021-09-06 MED ORDER — ONDANSETRON HCL 4 MG/2ML IJ SOLN
INTRAMUSCULAR | Status: AC
  Filled 2021-09-06: qty 2

## 2021-09-06 MED ORDER — FENTANYL CITRATE (PF) 100 MCG/2ML IJ SOLN: 25.0000 ug | INTRAMUSCULAR | Status: DC | PRN

## 2021-09-06 MED ORDER — LIDOCAINE HCL (CARDIAC) PF 100 MG/5ML IV SOSY
PREFILLED_SYRINGE | INTRAVENOUS | Status: DC | PRN
  Administered 2021-09-06: 80 mg via INTRAVENOUS

## 2021-09-06 MED ORDER — FENTANYL CITRATE (PF) 100 MCG/2ML IJ SOLN
INTRAMUSCULAR | Status: AC
  Filled 2021-09-06: qty 2

## 2021-09-06 MED ORDER — LIDOCAINE HCL (PF) 2 % IJ SOLN
INTRAMUSCULAR | Status: AC
  Filled 2021-09-06: qty 5

## 2021-09-06 MED ORDER — PROPOFOL 10 MG/ML IV BOLUS
INTRAVENOUS | Status: DC | PRN
  Administered 2021-09-06: 50 mg via INTRAVENOUS

## 2021-09-06 MED ORDER — PROPOFOL 10 MG/ML IV BOLUS
INTRAVENOUS | Status: AC
  Filled 2021-09-06: qty 20

## 2021-09-06 MED ORDER — CEFAZOLIN SODIUM-DEXTROSE 2-4 GM/100ML-% IV SOLN
INTRAVENOUS | Status: AC
  Filled 2021-09-06: qty 100

## 2021-09-06 SURGICAL SUPPLY — 36 items
BAG DRAIN CYSTO-URO LG1000N (MISCELLANEOUS) ×2 IMPLANT
BAG URINE DRAIN 2000ML AR STRL (UROLOGICAL SUPPLIES) ×3 IMPLANT
CATH FOLEY 2WAY 18X30 (CATHETERS) IMPLANT
CATH FOLEY 2WAY SIL 18X30 (CATHETERS)
CATH FOLEY 3WAY 30CC 18FR (CATHETERS) ×1 IMPLANT
CATH URET FLEX-TIP 2 LUMEN 10F (CATHETERS) ×2 IMPLANT
DRAPE UTILITY 15X26 TOWEL STRL (DRAPES) ×2 IMPLANT
DRSG TELFA 4X3 1S NADH ST (GAUZE/BANDAGES/DRESSINGS) ×2 IMPLANT
ELECT BIVAP BIPO 22/24 DONUT (ELECTROSURGICAL) ×2
ELECT LOOP 22F BIPOLAR SML (ELECTROSURGICAL)
ELECT REM PT RETURN 9FT ADLT (ELECTROSURGICAL) ×2
ELECTRD BIVAP BIPO 22/24 DONUT (ELECTROSURGICAL) IMPLANT
ELECTRODE LOOP 22F BIPOLAR SML (ELECTROSURGICAL) IMPLANT
ELECTRODE REM PT RTRN 9FT ADLT (ELECTROSURGICAL) ×1 IMPLANT
GAUZE 4X4 16PLY ~~LOC~~+RFID DBL (SPONGE) ×4 IMPLANT
GLOVE SURG UNDER POLY LF SZ7.5 (GLOVE) ×2 IMPLANT
GOWN STRL REUS W/ TWL LRG LVL3 (GOWN DISPOSABLE) ×1 IMPLANT
GOWN STRL REUS W/ TWL XL LVL3 (GOWN DISPOSABLE) ×1 IMPLANT
GOWN STRL REUS W/TWL LRG LVL3 (GOWN DISPOSABLE) ×1
GOWN STRL REUS W/TWL XL LVL3 (GOWN DISPOSABLE) ×1
GOWN STRL REUS W/TWL XL LVL4 (GOWN DISPOSABLE) ×2 IMPLANT
GUIDEWIRE STR DUAL SENSOR (WIRE) ×4 IMPLANT
IV NS IRRIG 3000ML ARTHROMATIC (IV SOLUTION) ×4 IMPLANT
KIT TURNOVER CYSTO (KITS) ×2 IMPLANT
LOOP CUT BIPOLAR 24F LRG (ELECTROSURGICAL) IMPLANT
MANIFOLD NEPTUNE II (INSTRUMENTS) ×2 IMPLANT
PACK CYSTO AR (MISCELLANEOUS) ×2 IMPLANT
SET CYSTO W/LG BORE CLAMP LF (SET/KITS/TRAYS/PACK) ×2 IMPLANT
SET IRRIG Y TYPE TUR BLADDER L (SET/KITS/TRAYS/PACK) ×2 IMPLANT
SHEATH URETERAL 12FRX35CM (MISCELLANEOUS) ×2 IMPLANT
SURGILUBE 2OZ TUBE FLIPTOP (MISCELLANEOUS) ×2 IMPLANT
SYR TOOMEY IRRIG 70ML (MISCELLANEOUS) ×2
SYRINGE TOOMEY IRRIG 70ML (MISCELLANEOUS) ×1 IMPLANT
WATER STERILE IRR 1000ML POUR (IV SOLUTION) ×2 IMPLANT
WATER STERILE IRR 3000ML UROMA (IV SOLUTION) ×2 IMPLANT
WATER STERILE IRR 500ML POUR (IV SOLUTION) ×2 IMPLANT

## 2021-09-06 NOTE — Plan of Care (Signed)

## 2021-09-06 NOTE — Progress Notes (Signed)
Urology Consult Follow Up  Subjective: No complaints this morning Foley catheter draining maroon-colored urine.  Anti-infectives: Anti-infectives (From admission, onward)    Start     Dose/Rate Route Frequency Ordered Stop   09/02/21 1100  cefTRIAXone (ROCEPHIN) 2 g in sodium chloride 0.9 % 100 mL IVPB  Status:  Discontinued        2 g 200 mL/hr over 30 Minutes Intravenous Every 24 hours 09/01/21 1224 09/02/21 1004   09/02/21 1100  cefTRIAXone (ROCEPHIN) 1 g in sodium chloride 0.9 % 100 mL IVPB  Status:  Discontinued        1 g 200 mL/hr over 30 Minutes Intravenous Every 24 hours 09/02/21 1004 09/03/21 1414   09/01/21 1045  cefTRIAXone (ROCEPHIN) 2 g in sodium chloride 0.9 % 100 mL IVPB        2 g 200 mL/hr over 30 Minutes Intravenous  Once 09/01/21 1044 09/01/21 1138       Current Facility-Administered Medications  Medication Dose Route Frequency Provider Last Rate Last Admin   0.9 %  sodium chloride infusion   Intravenous Continuous Gwynne Edinger, MD 100 mL/hr at 09/05/21 1332 New Bag at 09/05/21 1332   acetaminophen (TYLENOL) tablet 1,000 mg  1,000 mg Oral Q6H PRN Gwynne Edinger, MD   1,000 mg at 09/06/21 0143   albuterol (PROVENTIL) (2.5 MG/3ML) 0.083% nebulizer solution 2.5 mg  2.5 mg Nebulization Q6H PRN Agbata, Tochukwu, MD       amiodarone (PACERONE) tablet 200 mg  200 mg Oral BID Agbata, Tochukwu, MD   200 mg at 09/05/21 2304   Chlorhexidine Gluconate Cloth 2 % PADS 6 each  6 each Topical Daily Gwynne Edinger, MD   6 each at 09/05/21 0853   ondansetron (ZOFRAN) tablet 4 mg  4 mg Oral Q6H PRN Agbata, Tochukwu, MD       Or   ondansetron (ZOFRAN) injection 4 mg  4 mg Intravenous Q6H PRN Agbata, Tochukwu, MD       pantoprazole (PROTONIX) EC tablet 40 mg  40 mg Oral Daily Agbata, Tochukwu, MD   40 mg at 09/05/21 0852   pravastatin (PRAVACHOL) tablet 40 mg  40 mg Oral q1800 Agbata, Tochukwu, MD   40 mg at 09/05/21 1740   tamsulosin (FLOMAX) capsule 0.4 mg  0.4 mg  Oral Daily Agbata, Tochukwu, MD   0.4 mg at 09/05/21 6144   vitamin B-12 (CYANOCOBALAMIN) tablet 1,000 mcg  1,000 mcg Oral Daily Agbata, Tochukwu, MD   1,000 mcg at 09/05/21 0852     Objective: Vital signs in last 24 hours: Temp:  [97.4 F (36.3 C)-97.8 F (36.6 C)] 97.7 F (36.5 C) (09/20 0858) Pulse Rate:  [69-92] 69 (09/20 0858) Resp:  [16-18] 16 (09/20 0858) BP: (100-120)/(57-70) 120/67 (09/20 0858) SpO2:  [92 %-99 %] 99 % (09/20 0858)  Intake/Output from previous day: 09/19 0701 - 09/20 0700 In: 1440.6 [I.V.:1440.6] Out: 960 [Urine:960] Intake/Output this shift: No intake/output data recorded.   Physical Exam: Alert, in no acute distress Abdomen soft, nontender GU Foley with maroon-colored urine, not irrigated  Lab Results:  Recent Labs    09/05/21 0426 09/06/21 0444  WBC 7.8 7.3  HGB 8.2* 7.2*  HCT 23.8* 22.0*  PLT 136* 133*   BMET Recent Labs    09/05/21 0426 09/06/21 0444  NA 135 137  K 4.1 4.0  CL 103 106  CO2 27 25  GLUCOSE 135* 108*  BUN 28* 25*  CREATININE 1.83* 1.72*  CALCIUM 8.2*  7.7*     Assessment: Urinary retention with persistent hematuria May be secondary to diffuse cystitis from chronic retention  Plan: Discussed cystoscopy under sedation today with possible fulguration, biopsy or TURBT Patient is agreeable to proceed I discussed with his son who had no questions and agrees with proceeding with cystoscopy     LOS: 5 days    Abbie Sons 09/06/2021

## 2021-09-06 NOTE — Interval H&P Note (Signed)
History and Physical Interval Note: CV:RRR Lungs: clear  09/06/2021 12:50 PM  Michael Simon  has presented today for surgery, with the diagnosis of Remove small pieces of tissue from bladder Destroy abnormal growths or tissue of bladder.  The various methods of treatment have been discussed with the patient and family. After consideration of risks, benefits and other options for treatment, the patient has consented to  Procedure(s): CYSTOSCOPY WITH FULGERATION-Possible Bladder Biospy (N/A) TRANSURETHRAL RESECTION OF BLADDER TUMOR (TURBT)-Possible (N/A) as a surgical intervention.  The patient's history has been reviewed, patient examined, no change in status, stable for surgery.  I have reviewed the patient's chart and labs.  Questions were answered to the patient's satisfaction.     [place]

## 2021-09-06 NOTE — Anesthesia Procedure Notes (Signed)
Procedure Name: LMA Insertion Date/Time: 09/06/2021 1:33 PM Performed by: Rona Ravens, CRNA Pre-anesthesia Checklist: Patient identified, Patient being monitored, Timeout performed, Emergency Drugs available and Suction available Patient Re-evaluated:Patient Re-evaluated prior to induction Oxygen Delivery Method: Circle system utilized Preoxygenation: Pre-oxygenation with 100% oxygen Induction Type: IV induction Ventilation: Mask ventilation without difficulty LMA: LMA inserted LMA Size: 5.0 Tube type: Oral Number of attempts: 1 Placement Confirmation: positive ETCO2 and breath sounds checked- equal and bilateral Tube secured with: Tape Dental Injury: Teeth and Oropharynx as per pre-operative assessment

## 2021-09-06 NOTE — Op Note (Signed)
Preoperative diagnosis:  Gross hematuria Urinary retention  Postoperative diagnosis:  Same  Procedure: Cystoscopy with clot evacuation Fulguration bladder neck vessels  Surgeon: Abbie Sons, MD  Anesthesia: General  Complications: None  Intraoperative findings:  Cystoscopy: Urethra normal in caliber without stricture.  Mild-moderate lateral lobe enlargement with hypervascularity.  Prominent vessels bladder neck several oozing Bladder mucosa without solid or papillary lesions.  Inflammatory changes bladder base secondary to indwelling Foley. UOs normal in appearance  EBL: Minimal  Specimens: None  Indication: STANLY SI is a 87 y.o. seen in our office 08/31/2021 with urinary retention and >800 mL in the bladder.  A Foley catheter was placed and he presented to the ED the following morning with hematuria and hypotension.  He has been on Eliquis which has been held however has had persistent hematuria which clears on irrigation and has not required CBI.  Due to persistent bleeding patient and family have elected to pursue cystoscopy.  After reviewing the management options for treatment, he elected to proceed with the above surgical procedure(s). We have discussed the potential benefits and risks of the procedure, side effects of the proposed treatment, the likelihood of the patient achieving the goals of the procedure, and any potential problems that might occur during the procedure or recuperation. Informed consent has been obtained.  Description of procedure:  The patient was taken to the operating room and general anesthesia was induced.  The patient was placed in the dorsal lithotomy position, prepped and draped in the usual sterile fashion, and preoperative antibiotics were administered. A preoperative time-out was performed.   A 21 French cystoscope was lubricated, passed per urethra and advanced proximally under direct vision into the bladder.  A moderate amount of  clot was noted which was removed via irrigation.  Panendoscopy then performed with findings as described above. After completion of panendoscopy the cystoscope was removed.  A 24 French resectoscope sheath with Timberlake obturator was lubricated, passed per urethra and advanced into the bladder without difficulty.  An Iglesias resectoscope with button electrode was then placed into the sheath.  Oozing vessels of the bladder neck were fulgurated as well as prominent prostatic vasculature.  Multiple superficial vessels just inside the bladder neck away from the trigone or also fulgurated.  At the completion of the procedure no bleeding was noted with the irrigant inflow off.  The resectoscope was removed and an 66 [place] three-way Foley catheter was placed without difficulty.  The balloon was inflated with 15 mL of sterile water.  Catheter was irrigated with clear effluent.  The cath was placed on low-flow CBI with clear effluent.  After anesthetic reversal he was transported to the PACU in stable condition.  Plan: Overnight CBI   Abbie Sons, M.D.

## 2021-09-06 NOTE — H&P (View-Only) (Signed)
Urology Consult Follow Up  Subjective: No complaints this morning Foley catheter draining maroon-colored urine.  Anti-infectives: Anti-infectives (From admission, onward)    Start     Dose/Rate Route Frequency Ordered Stop   09/02/21 1100  cefTRIAXone (ROCEPHIN) 2 g in sodium chloride 0.9 % 100 mL IVPB  Status:  Discontinued        2 g 200 mL/hr over 30 Minutes Intravenous Every 24 hours 09/01/21 1224 09/02/21 1004   09/02/21 1100  cefTRIAXone (ROCEPHIN) 1 g in sodium chloride 0.9 % 100 mL IVPB  Status:  Discontinued        1 g 200 mL/hr over 30 Minutes Intravenous Every 24 hours 09/02/21 1004 09/03/21 1414   09/01/21 1045  cefTRIAXone (ROCEPHIN) 2 g in sodium chloride 0.9 % 100 mL IVPB        2 g 200 mL/hr over 30 Minutes Intravenous  Once 09/01/21 1044 09/01/21 1138       Current Facility-Administered Medications  Medication Dose Route Frequency Provider Last Rate Last Admin   0.9 %  sodium chloride infusion   Intravenous Continuous Gwynne Edinger, MD 100 mL/hr at 09/05/21 1332 New Bag at 09/05/21 1332   acetaminophen (TYLENOL) tablet 1,000 mg  1,000 mg Oral Q6H PRN Gwynne Edinger, MD   1,000 mg at 09/06/21 0143   albuterol (PROVENTIL) (2.5 MG/3ML) 0.083% nebulizer solution 2.5 mg  2.5 mg Nebulization Q6H PRN Agbata, Tochukwu, MD       amiodarone (PACERONE) tablet 200 mg  200 mg Oral BID Agbata, Tochukwu, MD   200 mg at 09/05/21 2304   Chlorhexidine Gluconate Cloth 2 % PADS 6 each  6 each Topical Daily Gwynne Edinger, MD   6 each at 09/05/21 0853   ondansetron (ZOFRAN) tablet 4 mg  4 mg Oral Q6H PRN Agbata, Tochukwu, MD       Or   ondansetron (ZOFRAN) injection 4 mg  4 mg Intravenous Q6H PRN Agbata, Tochukwu, MD       pantoprazole (PROTONIX) EC tablet 40 mg  40 mg Oral Daily Agbata, Tochukwu, MD   40 mg at 09/05/21 0852   pravastatin (PRAVACHOL) tablet 40 mg  40 mg Oral q1800 Agbata, Tochukwu, MD   40 mg at 09/05/21 1740   tamsulosin (FLOMAX) capsule 0.4 mg  0.4 mg  Oral Daily Agbata, Tochukwu, MD   0.4 mg at 09/05/21 7096   vitamin B-12 (CYANOCOBALAMIN) tablet 1,000 mcg  1,000 mcg Oral Daily Agbata, Tochukwu, MD   1,000 mcg at 09/05/21 0852     Objective: Vital signs in last 24 hours: Temp:  [97.4 F (36.3 C)-97.8 F (36.6 C)] 97.7 F (36.5 C) (09/20 0858) Pulse Rate:  [69-92] 69 (09/20 0858) Resp:  [16-18] 16 (09/20 0858) BP: (100-120)/(57-70) 120/67 (09/20 0858) SpO2:  [92 %-99 %] 99 % (09/20 0858)  Intake/Output from previous day: 09/19 0701 - 09/20 0700 In: 1440.6 [I.V.:1440.6] Out: 960 [Urine:960] Intake/Output this shift: No intake/output data recorded.   Physical Exam: Alert, in no acute distress Abdomen soft, nontender GU Foley with maroon-colored urine, not irrigated  Lab Results:  Recent Labs    09/05/21 0426 09/06/21 0444  WBC 7.8 7.3  HGB 8.2* 7.2*  HCT 23.8* 22.0*  PLT 136* 133*   BMET Recent Labs    09/05/21 0426 09/06/21 0444  NA 135 137  K 4.1 4.0  CL 103 106  CO2 27 25  GLUCOSE 135* 108*  BUN 28* 25*  CREATININE 1.83* 1.72*  CALCIUM 8.2*  7.7*     Assessment: Urinary retention with persistent hematuria May be secondary to diffuse cystitis from chronic retention  Plan: Discussed cystoscopy under sedation today with possible fulguration, biopsy or TURBT Patient is agreeable to proceed I discussed with his son who had no questions and agrees with proceeding with cystoscopy     LOS: 5 days    Abbie Sons 09/06/2021

## 2021-09-06 NOTE — Progress Notes (Signed)
PROGRESS NOTE    Michael Simon  FAO:[id number] DOB: [birth year] DOA: 09/01/2021 PCP: [name], MD  Outpatient Specialists: urology    Brief Narrative:   From admission hpi: Michael Simon is a 87 y.o. male with medical history significant for chronic urinary retention status post insertion of Foley catheter on 08/31/21, diabetes mellitus, hypertension, chronic A. fib on anticoagulation therapy who presents to the ER via EMS for evaluation of mental status changes. Patient's son states that he had seen neurology 1 day prior to his admission for symptoms related to chronic urinary retention and had a Foley catheter placed.  He states that the Foley catheter insertion was difficult and multiple attempts were made prior to getting it in.  He states that patient had blood in the bag after the procedure and continued bleeding even after they went home. On the morning of his admission patient was found unresponsive in his chair and so EMS was called.  He was hypotensive with blood pressure of 60/30 and received IV fluids via EMS.  At the time of my evaluation he is awake and alert blood pressure has improved. He denies having any abdominal pain, no fever, no chills, no changes in his bowel habits, no melena stools, no hematochezia, no headache, no leg swelling, no blurred vision, no palpitations, no diaphoresis, no difficulty swallowing, no cough.   Assessment & Plan:   Principal Problem:   Acute blood loss anemia Active Problems:   Diabetes mellitus (HCC)   Essential hypertension   Generalized weakness   Atrial flutter (HCC)   CKD stage 4 due to type 2 diabetes mellitus (HCC)   Hematuria, gross   Acute lower UTI   Acute blood loss anemia (ABLA)  # BPH with obstruction and urinary retention Foley placed 9/14, traumatic - maintain foley, TOV scheduled w/ urology as outpatient - cont flomax - stopped ceftriaxone, urine culture is neg  # Acute blood loss anemia secondary  to traumatic Foley Continues to have blood in urine, has been irrigated multiple times. S/p 1 u prbc, this morning hgb 7.2 from 8s yesterday, will transfuse additional unit.  - 1 u prbc - given persistent bleeding urology plans cystoscopy today - holding apixaban for now  # Hypotension # unresponsiveness # History hypertension Likely 2/2 combination of hypovolemia (reduced po, post-obstructive diuresis) and anemia. Resolved though BPs soft - holding home lasix, lisinopril - PT consulted, advising HH PT, those orders placed today  # AKI on ckd3b/4 Baseline cr appears to be around 2, here upper 2s on presentation, now stable in upper 1s; aki likely 2/2 obstruction. Given improving kidney function it is possible antihypertensives also contributing to prior decreased kidney function - monitor  # A flutter # Hx CAD s/p cabg Rate controlled - holding apixaban as above - cont home amio  # T2DM Glucose wnl here - hold home meds - daily fasting glucose   DVT prophylaxis: SCDs Code Status: full Family Communication: son updated @ bedside 9/20  Level of care: Med-Surg Status is: Inpatient  Remains inpatient appropriate because:Inpatient level of care appropriate due to severity of illness  Dispo: The patient is from: Home              Anticipated d/c is to: Home with home health              Patient currently is not medically stable to d/c.   Difficult to place patient No    Consultants:  urology  Procedures: none  Antimicrobials:  Ceftriaxone 9/15>9/17   Subjective: This morning feeling well, denies pain, hematuria persists  Objective: Vitals:   09/05/21 0806 09/05/21 1708 09/05/21 2124 09/06/21 0858  BP: (!) 92/58 (!) 119/57 100/70 120/67  Pulse: 65 72 92 69  Resp: 17 18 17 16   Temp: (!) 97.5 F (36.4 C) (!) 97.4 F (36.3 C) 97.8 F (36.6 C) 97.7 F (36.5 C)  TempSrc: Oral  Oral Oral  SpO2: 92% 92% 94% 99%  Weight:      Height:        Intake/Output  Summary (Last 24 hours) at 09/06/2021 0951 Last data filed at 09/06/2021 0400 Gross per 24 hour  Intake 1440.58 ml  Output 960 ml  Net 480.58 ml   Filed Weights   09/01/21 1001  Weight: 77 kg    Examination:  General exam: Appears calm and comfortable  Respiratory system: Clear to auscultation save for rales at bases Cardiovascular system: S1 & S2 heard, RRR. No JVD, murmurs, rubs, gallops or clicks. No pedal edema. Gastrointestinal system: Abdomen is nondistended, soft and nontender. No organomegaly or masses felt. Normal bowel sounds heard. Central nervous system: Alert and oriented. No focal neurological deficits. Extremities: Symmetric 5 x 5 power. Skin: No rashes, lesions or ulcers Psychiatry: Judgement and insight appear normal. Mood & affect appropriate.     Data Reviewed: I have personally reviewed following labs and imaging studies  CBC: Recent Labs  Lab 08/31/21 1322 09/01/21 1004 09/01/21 1751 09/02/21 0445 09/03/21 0516 09/04/21 0530 09/05/21 0426 09/06/21 0444  WBC 9.3 9.2  --   --  8.5 7.2 7.8 7.3  NEUTROABS 6,436 7.0  --   --   --   --   --   --   HGB 10.8* 8.4*   < > 8.4* 8.1* 7.3* 8.2* 7.2*  HCT 32.6* 25.4*   < > 25.8* 24.5* 22.5* 23.8* 22.0*  MCV 100.3* 101.6*  --   --  100.8* 100.0 98.8 102.3*  PLT 172 132*  --   --  145* 137* 136* 133*   < > = values in this interval not displayed.   Basic Metabolic Panel: Recent Labs  Lab 09/02/21 0445 09/03/21 0516 09/04/21 0530 09/05/21 0426 09/06/21 0444  NA 137 135 136 135 137  K 3.8 3.9 4.0 4.1 4.0  CL 106 103 101 103 106  CO2 22 24 27 27 25   GLUCOSE 82 133* 125* 135* 108*  BUN 42* 36* 28* 28* 25*  CREATININE 2.43* 1.95* 1.66* 1.83* 1.72*  CALCIUM 7.9* 8.0* 8.0* 8.2* 7.7*   GFR: Estimated Creatinine Clearance: 29.8 mL/min (A) (by C-G formula based on SCr of 1.72 mg/dL (H)). Liver Function Tests: Recent Labs  Lab 08/31/21 1322 09/01/21 1004  AST 24 25  ALT 16 13  ALKPHOS  --  111  BILITOT  0.7 0.7  PROT 7.6 6.2*  ALBUMIN  --  3.7   No results for input(s): LIPASE, AMYLASE in the last 168 hours. No results for input(s): AMMONIA in the last 168 hours. Coagulation Profile: Recent Labs  Lab 09/01/21 1004  INR 1.5*   Cardiac Enzymes: No results for input(s): CKTOTAL, CKMB, CKMBINDEX, TROPONINI in the last 168 hours. BNP (last 3 results) No results for input(s): PROBNP in the last 8760 hours. HbA1C: No results for input(s): HGBA1C in the last 72 hours.  CBG: Recent Labs  Lab 09/04/21 1706 09/04/21 2041 09/05/21 0800 09/05/21 1707 09/05/21 2129  GLUCAP 137* 206* 124* 147* 119*   Lipid  Profile: No results for input(s): CHOL, HDL, LDLCALC, TRIG, CHOLHDL, LDLDIRECT in the last 72 hours. Thyroid Function Tests: No results for input(s): TSH, T4TOTAL, FREET4, T3FREE, THYROIDAB in the last 72 hours. Anemia Panel: No results for input(s): VITAMINB12, FOLATE, FERRITIN, TIBC, IRON, RETICCTPCT in the last 72 hours.  Urine analysis:    Component Value Date/Time   COLORURINE RED (A) 09/01/2021 1004   APPEARANCEUR TURBID (A) 09/01/2021 1004   LABSPEC 1.020 09/01/2021 1004   PHURINE  09/01/2021 1004    TEST NOT REPORTED DUE TO COLOR INTERFERENCE OF URINE PIGMENT   GLUCOSEU (A) 09/01/2021 1004    TEST NOT REPORTED DUE TO COLOR INTERFERENCE OF URINE PIGMENT   HGBUR (A) 09/01/2021 1004    TEST NOT REPORTED DUE TO COLOR INTERFERENCE OF URINE PIGMENT   BILIRUBINUR (A) 09/01/2021 1004    TEST NOT REPORTED DUE TO COLOR INTERFERENCE OF URINE PIGMENT   BILIRUBINUR Negative 08/29/2021 1303   KETONESUR (A) 09/01/2021 1004    TEST NOT REPORTED DUE TO COLOR INTERFERENCE OF URINE PIGMENT   PROTEINUR (A) 09/01/2021 1004    TEST NOT REPORTED DUE TO COLOR INTERFERENCE OF URINE PIGMENT   UROBILINOGEN 0.2 08/29/2021 1303   NITRITE (A) 09/01/2021 1004    TEST NOT REPORTED DUE TO COLOR INTERFERENCE OF URINE PIGMENT   LEUKOCYTESUR (A) 09/01/2021 1004    TEST NOT REPORTED DUE TO COLOR  INTERFERENCE OF URINE PIGMENT   Sepsis Labs: @LABRCNTIP (procalcitonin:4,lacticidven:4)  ) Recent Results (from the past 240 hour(s))  Urine Culture     Status: None   Collection Time: 09/01/21 10:04 AM   Specimen: Urine, Clean Catch  Result Value Ref Range Status   Specimen Description   Final    URINE, CLEAN CATCH Performed at [facility], [address], [place], [place] [address]    Special Requests   Final    NONE Performed at [facility], [address], [place], [place] [address]    Culture   Final    NO GROWTH Performed at [facility], [place] [address] [place], [place] [address]    Report Status 09/03/2021 FINAL  Final  Blood culture (routine x 2)     Status: None   Collection Time: 09/01/21 11:08 AM   Specimen: BLOOD  Result Value Ref Range Status   Specimen Description BLOOD  [facility]  Final   Special Requests   Final    BOTTLES DRAWN AEROBIC AND ANAEROBIC Blood Culture results may not be optimal due to an inadequate volume of blood received in culture bottles   Culture   Final    NO GROWTH 5 DAYS Performed at [facility], [address], [place], [place] [address]    Report Status 09/06/2021 FINAL  Final  Blood culture (routine x 2)     Status: None   Collection Time: 09/01/21 11:08 AM   Specimen: BLOOD  Result Value Ref Range Status   Specimen Description BLOOD RIGHT ANTECUBITAL  Final   Special Requests   Final    BOTTLES DRAWN AEROBIC AND ANAEROBIC Blood Culture adequate volume   Culture   Final    NO GROWTH 5 DAYS Performed at [facility], [address] [place], [place] [address]    Report Status 09/06/2021 FINAL  Final  Resp Panel by RT-PCR (Flu A&B, Covid) Nasopharyngeal Swab     Status: None   Collection Time: 09/01/21 12:53 PM   Specimen: Nasopharyngeal Swab; Nasopharyngeal(NP) swabs in vial transport medium  Result Value Ref Range Status   SARS Coronavirus 2 by RT PCR NEGATIVE NEGATIVE Final     Comment: (NOTE) SARS-CoV-2 target nucleic acids are NOT DETECTED.  The SARS-CoV-2 RNA is generally detectable in upper respiratory specimens during the acute phase of infection. The lowest concentration of SARS-CoV-2 viral copies this assay can detect is 138 copies/mL. A negative result does not preclude SARS-Cov-2 infection and should not be used as the sole basis for treatment or other patient management decisions. A negative result may occur with  improper specimen collection/handling, submission of specimen other than nasopharyngeal swab, presence of viral mutation(s) within the areas targeted by this assay, and inadequate number of viral copies(<138 copies/mL). A negative result must be combined with clinical observations, patient history, and epidemiological information. The expected result is Negative.  Fact Sheet for Patients:  EntrepreneurPulse.com.au  Fact Sheet for Healthcare Providers:  IncredibleEmployment.be  This test is no t yet approved or cleared by the [place] FDA and  has been authorized for detection and/or diagnosis of SARS-CoV-2 by FDA under an Emergency Use Authorization (EUA). This EUA will remain  in effect (meaning this test can be used) for the duration of the COVID-19 declaration under Section 564(b)(1) of the Act, 21 U.S.C.section 360bbb-3(b)(1), unless the authorization is terminated  or revoked sooner.       Influenza A by PCR NEGATIVE NEGATIVE Final   Influenza B by PCR NEGATIVE NEGATIVE Final    Comment: (NOTE) The Xpert Xpress SARS-CoV-2/FLU/RSV plus assay is intended as an aid in the diagnosis of influenza from Nasopharyngeal swab specimens and should not be used as a sole basis for treatment. Nasal washings and aspirates are unacceptable for Xpert Xpress SARS-CoV-2/FLU/RSV testing.  Fact Sheet for Patients: EntrepreneurPulse.com.au  Fact Sheet for Healthcare  Providers: IncredibleEmployment.be  This test is not yet approved or cleared by the [place] FDA and has been authorized for detection and/or diagnosis of SARS-CoV-2 by FDA under an Emergency Use Authorization (EUA). This EUA will remain in effect (meaning this test can be used) for the duration of the COVID-19 declaration under Section 564(b)(1) of the Act, 21 U.S.C. section 360bbb-3(b)(1), unless the authorization is terminated or revoked.  Performed at [facility], [address], [place], [place] [address]          Radiology Studies: No results found.      Scheduled Meds:  sodium chloride   Intravenous Once   amiodarone  200 mg Oral BID   Chlorhexidine Gluconate Cloth  6 each Topical Daily   pantoprazole  40 mg Oral Daily   pravastatin  40 mg Oral q1800   tamsulosin  0.4 mg Oral Daily   vitamin B-12  1,000 mcg Oral Daily   Continuous Infusions:  sodium chloride 100 mL/hr at 09/05/21 1332      LOS: 5 days    Time spent: 25 min    Desma Maxim, MD [facility]   If 7PM-7AM, please contact night-coverage [email or web address] Password [email or web address] 09/06/2021, 9:51 AM

## 2021-09-06 NOTE — Transfer of Care (Signed)
Immediate Anesthesia Transfer of Care Note  Patient: Michael Simon  Procedure(s) Performed: CYSTOSCOPY WITH FULGERATION-Possible Bladder Biospy TRANSURETHRAL RESECTION OF BLADDER TUMOR (TURBT)-Possible  Patient Location: PACU  Anesthesia Type:General  Level of Consciousness: awake, alert  and oriented  Airway & Oxygen Therapy: Patient Spontanous Breathing and Patient connected to face mask oxygen  Post-op Assessment: Report given to RN and Post -op Vital signs reviewed and stable  Post vital signs: Reviewed and stable  Last Vitals:  Vitals Value Taken Time  BP 100/50 09/06/21 1403  Temp    Pulse 61 09/06/21 1408  Resp 14 09/06/21 1408  SpO2 100 % 09/06/21 1408  Vitals shown include unvalidated device data.  Last Pain:  Vitals:   09/06/21 1132  TempSrc: Temporal  PainSc: 0-No pain         Complications: No notable events documented.

## 2021-09-06 NOTE — Anesthesia Postprocedure Evaluation (Signed)
Anesthesia Post Note  Patient: BRALLAN DENIO  Procedure(s) Performed: CYSTOSCOPY WITH FULGERATION-Possible Bladder Biospy TRANSURETHRAL RESECTION OF BLADDER TUMOR (TURBT)-Possible  Patient location during evaluation: PACU Anesthesia Type: General Level of consciousness: awake and alert Pain management: pain level controlled Vital Signs Assessment: post-procedure vital signs reviewed and stable Respiratory status: spontaneous breathing, nonlabored ventilation, respiratory function stable and patient connected to nasal cannula oxygen Cardiovascular status: blood pressure returned to baseline and stable Postop Assessment: no apparent nausea or vomiting Anesthetic complications: no   No notable events documented.   Last Vitals:  Vitals:   09/06/21 1421 09/06/21 1430  BP: (!) 84/61 104/61  Pulse: (!) 55 (!) 56  Resp: (!) 22 17  Temp:  36.8 C  SpO2: 95% 93%    Last Pain:  Vitals:   09/06/21 1430  TempSrc:   PainSc: 0-No pain                 Arita Miss

## 2021-09-06 NOTE — Anesthesia Preprocedure Evaluation (Signed)
Anesthesia Evaluation  Patient identified by MRN, date of birth, ID band Patient awake and Patient confused  General Assessment Comment:AOx2  Reviewed: Allergy & Precautions, NPO status , Patient's Chart, lab work & pertinent test results, reviewed documented beta blocker date and time   History of Anesthesia Complications Negative for: history of anesthetic complications  Airway Mallampati: III  TM Distance: >3 FB Neck ROM: Full    Dental  (+) Upper Dentures, Edentulous Lower   Pulmonary neg sleep apnea, neg COPD, Patient abstained from smoking.Not current smoker, former smoker,    Pulmonary exam normal breath sounds clear to auscultation       Cardiovascular Exercise Tolerance: Good METShypertension, Pt. on medications and Pt. on home beta blockers + CAD, + Past MI and + CABG  (-) CHF + dysrhythmias Atrial Fibrillation (-) Valvular Problems/Murmurs Rhythm:Irregular Rate:Normal - Systolic murmurs    Neuro/Psych neg Seizures negative neurological ROS  negative psych ROS   GI/Hepatic Neg liver ROS, neg GERD  ,  Endo/Other  diabetes, Type 2, Oral Hypoglycemic Agents  Renal/GU CRFRenal disease     Musculoskeletal   Abdominal   Peds  Hematology   Anesthesia Other Findings Past Medical History: 04/14/2021: Basal cell carcinoma     Comment:  left medial clavicle [facility] 05/31/2021 04/14/2021: Basal cell carcinoma     Comment:  nose tip  02/15/2021: Basal cell carcinoma (BCC) of skin of nose No date: CAD (coronary artery disease) 2022: Diverticulitis No date: HLD (hyperlipidemia) No date: HTN (hypertension) No date: LBBB (left bundle branch block) No date: Myocardial infarction ([place]) No date: Osteoarthritis No date: Type II diabetes mellitus ([place])  Reproductive/Obstetrics                             Anesthesia Physical  Anesthesia Plan  ASA: 3  Anesthesia Plan: General   Post-op Pain  Management:    Induction: Intravenous  PONV Risk Score and Plan: 3 and Ondansetron, Dexamethasone and Treatment may vary due to age or medical condition  Airway Management Planned: LMA  Additional Equipment: None  Intra-op Plan:   Post-operative Plan: Extubation in OR  Informed Consent: I have reviewed the patients History and Physical, chart, labs and discussed the procedure including the risks, benefits and alternatives for the proposed anesthesia with the patient or authorized representative who has indicated his/her understanding and acceptance.     Dental advisory given  Plan Discussed with: CRNA and Surgeon  Anesthesia Plan Comments: (Discussed risks of anesthesia with patient and son Rickie at bedside, including PONV, sore throat, lip/dental damage. Rare risks discussed as well, such as cardiorespiratory and neurological sequelae, and allergic reactions. They understand)        Anesthesia Quick Evaluation

## 2021-09-07 ENCOUNTER — Encounter: Payer: Self-pay | Admitting: Urology

## 2021-09-07 DIAGNOSIS — I2510 Atherosclerotic heart disease of native coronary artery without angina pectoris: Secondary | ICD-10-CM

## 2021-09-07 DIAGNOSIS — N3001 Acute cystitis with hematuria: Principal | ICD-10-CM

## 2021-09-07 DIAGNOSIS — D62 Acute posthemorrhagic anemia: Secondary | ICD-10-CM | POA: Diagnosis not present

## 2021-09-07 DIAGNOSIS — I959 Hypotension, unspecified: Secondary | ICD-10-CM | POA: Diagnosis not present

## 2021-09-07 DIAGNOSIS — R338 Other retention of urine: Secondary | ICD-10-CM | POA: Diagnosis not present

## 2021-09-07 DIAGNOSIS — R310 Gross hematuria: Secondary | ICD-10-CM | POA: Diagnosis not present

## 2021-09-07 LAB — GLUCOSE, CAPILLARY
Glucose-Capillary: 194 mg/dL — ABNORMAL HIGH (ref 70–99)
Glucose-Capillary: 158 mg/dL — ABNORMAL HIGH (ref 70–99)
Glucose-Capillary: 208 mg/dL — ABNORMAL HIGH (ref 70–99)
Glucose-Capillary: 217 mg/dL — ABNORMAL HIGH (ref 70–99)

## 2021-09-07 LAB — CBC
HCT: 23 % — ABNORMAL LOW (ref 39.0–52.0)
HCT: 27.2 % — ABNORMAL LOW (ref 39.0–52.0)
Hemoglobin: 7.8 g/dL — ABNORMAL LOW (ref 13.0–17.0)
Hemoglobin: 9.3 g/dL — ABNORMAL LOW (ref 13.0–17.0)
MCH: 33.2 pg (ref 26.0–34.0)
MCH: 32.7 pg (ref 26.0–34.0)
MCHC: 33.9 g/dL (ref 30.0–36.0)
MCHC: 34.2 g/dL (ref 30.0–36.0)
MCV: 97.9 fL (ref 80.0–100.0)
MCV: 95.8 fL (ref 80.0–100.0)
Platelets: 125 10*3/uL — ABNORMAL LOW (ref 150–400)
Platelets: 155 10*3/uL (ref 150–400)
RBC: 2.35 MIL/uL — ABNORMAL LOW (ref 4.22–5.81)
RBC: 2.84 MIL/uL — ABNORMAL LOW (ref 4.22–5.81)
RDW: 16.7 % — ABNORMAL HIGH (ref 11.5–15.5)
RDW: 16.9 % — ABNORMAL HIGH (ref 11.5–15.5)
WBC: 10.6 10*3/uL — ABNORMAL HIGH (ref 4.0–10.5)
WBC: 12.3 10*3/uL — ABNORMAL HIGH (ref 4.0–10.5)
nRBC: 0 % (ref 0.0–0.2)
nRBC: 0 % (ref 0.0–0.2)

## 2021-09-07 LAB — BASIC METABOLIC PANEL
Anion gap: 6 (ref 5–15)
BUN: 31 mg/dL — ABNORMAL HIGH (ref 8–23)
CO2: 26 mmol/L (ref 22–32)
Calcium: 7.8 mg/dL — ABNORMAL LOW (ref 8.9–10.3)
Chloride: 104 mmol/L (ref 98–111)
Creatinine, Ser: 1.93 mg/dL — ABNORMAL HIGH (ref 0.61–1.24)
GFR, Estimated: 33 mL/min — ABNORMAL LOW (ref >60)
Glucose, Bld: 172 mg/dL — ABNORMAL HIGH (ref 70–99)
Potassium: 4.6 mmol/L (ref 3.5–5.1)
Sodium: 136 mmol/L (ref 135–145)

## 2021-09-07 LAB — PREPARE RBC (CROSSMATCH)

## 2021-09-07 MED ORDER — SODIUM CHLORIDE 0.9% IV SOLUTION: Freq: Once | INTRAVENOUS | Status: AC

## 2021-09-07 NOTE — Progress Notes (Signed)
PROGRESS NOTE  Michael Simon    DOB: 1933-12-05, 87 y.o.  TIR:[id number]  PCP: [name], MD   Code Status: Full Code   DOA: 09/01/2021   LOS: 6  Brief Narrative of Current Hospitalization  Michael Simon is a 87 y.o. male with a PMH significant for chronic urinary retention status post insertion of Foley catheter on 08/31/21, diabetes mellitus, hypertension, chronic A. fib on anticoagulation. They presented from home to the ED on 09/01/2021 with AMS x1days. In the ED, it was found that they had acute anemia thought to be partially related to blood loss from traumatic urinary foley placement. They were treated with 2u pRBCs.  Patient was admitted to medicine service for further workup and management of anemia and urine retention as outlined in detail below.  09/07/21 -hgb below transfusion threshold and patient has hypotension/tachycardia. Receiving 1 more unit pRBCs  Assessment & Plan  Principal Problem:   Acute blood loss anemia Active Problems:   Diabetes mellitus (HCC)   Essential hypertension   Generalized weakness   Atrial flutter (HCC)   CKD stage 4 due to type 2 diabetes mellitus (HCC)   Hematuria, gross   Acute lower UTI   Acute blood loss anemia (ABLA)  Acute urinary retention 2/2 BPH- resolved with foley placement (9/14). Cystoscopy with clot evacuation 9/20. Continues to have gross hematuria.  - f/u urology who is monitoring urine on CBI with plans to discontinue when able - continue flomax  Anemia- s/p 2upRBCs. Hgb stable today at 7.8.  - transfusion threshold 8, transfuse another unit today - CBC s/p transfusion  Hypotension- Bps remain soft. 90s/50s - receiving 1upRBCs today - continue holding home antihypertensives  AKI on CKDIII- 2/2 obstruction- resolved.  -   H/o CAD s/p CABG -Continue pravastatin  COPD -Continue Spiriva, albuterol as needed  H/o A. fib possibly based off medications.  Continue amiodarone.  Eliquis is being held for  active bleeding  DVT prophylaxis: SCDs Start: 09/01/21 1221   Diet:  Diet Orders (From admission, onward)     Start     Ordered   09/06/21 1542  Diet Heart Room service appropriate? Yes; Fluid consistency: Thin  Diet effective now       Question Answer Comment  Room service appropriate? Yes   Fluid consistency: Thin      09/06/21 1541            Subjective 09/07/21    Pt reports feeling well today.  He has no complaints or concerns.  Disposition Plan & Communication  Status is: Inpatient  Remains inpatient appropriate because:Hemodynamically unstable and Ongoing diagnostic testing needed not appropriate for outpatient work up  Dispo: The patient is from: Home              Anticipated d/c is to: SNF              Patient currently is not medically stable to d/c.   Difficult to place patient No  Family Communication: Son on phone and in person  Consults, Procedures, Significant Events  Consultants:  Urology  Procedures/significant events:  Cystography  Antimicrobials:  Anti-infectives (From admission, onward)    Start     Dose/Rate Route Frequency Ordered Stop   09/06/21 1153  ceFAZolin (ANCEF) 2-4 GM/100ML-% IVPB       Note to Pharmacy: Register, Karen   : cabinet override      09/06/21 1153 09/06/21 1337   09/06/21 1115  ceFAZolin (ANCEF) IVPB 2g/100 mL premix  2 g 200 mL/hr over 30 Minutes Intravenous On call to O.R. 09/06/21 1017 09/06/21 1335   09/06/21 1045  ceFAZolin (ANCEF) powder 2 g  Status:  Discontinued        2 g Other To Surgery 09/06/21 0951 09/06/21 1017   09/02/21 1100  cefTRIAXone (ROCEPHIN) 2 g in sodium chloride 0.9 % 100 mL IVPB  Status:  Discontinued        2 g 200 mL/hr over 30 Minutes Intravenous Every 24 hours 09/01/21 1224 09/02/21 1004   09/02/21 1100  cefTRIAXone (ROCEPHIN) 1 g in sodium chloride 0.9 % 100 mL IVPB  Status:  Discontinued        1 g 200 mL/hr over 30 Minutes Intravenous Every 24 hours 09/02/21 1004 09/03/21  1414   09/01/21 1045  cefTRIAXone (ROCEPHIN) 2 g in sodium chloride 0.9 % 100 mL IVPB        2 g 200 mL/hr over 30 Minutes Intravenous  Once 09/01/21 1044 09/01/21 1138        Objective   Vitals:   09/06/21 2026 09/06/21 2141 09/06/21 2145 09/07/21 0400  BP: (!) 94/48 (!) 99/45 (!) 99/45 (!) 94/54  Pulse: 75 98  67  Resp: 18 16 16 16   Temp: 97.8 F (36.6 C) 97.9 F (36.6 C) 97.9 F (36.6 C) 98.6 F (37 C)  TempSrc: Oral Oral Oral Oral  SpO2: 97% 94% 94% 95%  Weight:      Height:        Intake/Output Summary (Last 24 hours) at 09/07/2021 0804 Last data filed at 09/06/2021 2145 Gross per 24 hour  Intake 4490 ml  Output 7875 ml  Net -3385 ml   Filed Weights   09/01/21 1001 09/06/21 1132  Weight: 77 kg 78 kg    Patient BMI: Body mass index is 26.15 kg/m.   Physical Exam: General: awake, alert, NAD HEENT: atraumatic, clear conjunctiva, anicteric sclera, moist mucus membranes, hard of hearing Respiratory: normal respiratory effort. Cardiovascular: normal S1/S2, RRR Gastrointestinal: soft, NT, ND, no HSM felt Nervous: A&O x3. no gross focal neurologic deficits, normal speech Extremities: moves all equally, trace LE edema, normal tone Skin: dry, intact, normal temperature, normal color, No rashes, lesions or ulcers  Labs   I have personally reviewed following labs and imaging studies No results displayed because visit has over 200 results.       Recent Results (from the past 240 hour(s))  Urine Culture     Status: None   Collection Time: 09/01/21 10:04 AM   Specimen: Urine, Clean Catch  Result Value Ref Range Status   Specimen Description   Final    URINE, CLEAN CATCH Performed at [facility], [address], [place], [place] [address]    Special Requests   Final    NONE Performed at [facility], [address], [place], [place] [address]    Culture   Final    NO GROWTH Performed at [facility], [place] [address] [place],  [place] [address]    Report Status 09/03/2021 FINAL  Final  Blood culture (routine x 2)     Status: None   Collection Time: 09/01/21 11:08 AM   Specimen: BLOOD  Result Value Ref Range Status   Specimen Description BLOOD  [facility]  Final   Special Requests   Final    BOTTLES DRAWN AEROBIC AND ANAEROBIC Blood Culture results may not be optimal due to an inadequate volume of blood received in culture bottles  Culture   Final    NO GROWTH 5 DAYS Performed at [facility], [address] [place], [place] [address]    Report Status 09/06/2021 FINAL  Final  Blood culture (routine x 2)     Status: None   Collection Time: 09/01/21 11:08 AM   Specimen: BLOOD  Result Value Ref Range Status   Specimen Description BLOOD RIGHT ANTECUBITAL  Final   Special Requests   Final    BOTTLES DRAWN AEROBIC AND ANAEROBIC Blood Culture adequate volume   Culture   Final    NO GROWTH 5 DAYS Performed at [facility], [address] [place], [place] [address]    Report Status 09/06/2021 FINAL  Final  Resp Panel by RT-PCR (Flu A&B, Covid) Nasopharyngeal Swab     Status: None   Collection Time: 09/01/21 12:53 PM   Specimen: Nasopharyngeal Swab; Nasopharyngeal(NP) swabs in vial transport medium  Result Value Ref Range Status   SARS Coronavirus 2 by RT PCR NEGATIVE NEGATIVE Final    Comment: (NOTE) SARS-CoV-2 target nucleic acids are NOT DETECTED.  The SARS-CoV-2 RNA is generally detectable in upper respiratory specimens during the acute phase of infection. The lowest concentration of SARS-CoV-2 viral copies this assay can detect is 138 copies/mL. A negative result does not preclude SARS-Cov-2 infection and should not be used as the sole basis for treatment or other patient management decisions. A negative result may occur with  improper specimen collection/handling, submission of specimen other than nasopharyngeal swab, presence of viral mutation(s) within the areas targeted by this assay, and  inadequate number of viral copies(<138 copies/mL). A negative result must be combined with clinical observations, patient history, and epidemiological information. The expected result is Negative.  Fact Sheet for Patients:  EntrepreneurPulse.com.au  Fact Sheet for Healthcare Providers:  IncredibleEmployment.be  This test is no t yet approved or cleared by the [place] FDA and  has been authorized for detection and/or diagnosis of SARS-CoV-2 by FDA under an Emergency Use Authorization (EUA). This EUA will remain  in effect (meaning this test can be used) for the duration of the COVID-19 declaration under Section 564(b)(1) of the Act, 21 U.S.C.section 360bbb-3(b)(1), unless the authorization is terminated  or revoked sooner.       Influenza A by PCR NEGATIVE NEGATIVE Final   Influenza B by PCR NEGATIVE NEGATIVE Final    Comment: (NOTE) The Xpert Xpress SARS-CoV-2/FLU/RSV plus assay is intended as an aid in the diagnosis of influenza from Nasopharyngeal swab specimens and should not be used as a sole basis for treatment. Nasal washings and aspirates are unacceptable for Xpert Xpress SARS-CoV-2/FLU/RSV testing.  Fact Sheet for Patients: EntrepreneurPulse.com.au  Fact Sheet for Healthcare Providers: IncredibleEmployment.be  This test is not yet approved or cleared by the [place] FDA and has been authorized for detection and/or diagnosis of SARS-CoV-2 by FDA under an Emergency Use Authorization (EUA). This EUA will remain in effect (meaning this test can be used) for the duration of the COVID-19 declaration under Section 564(b)(1) of the Act, 21 U.S.C. section 360bbb-3(b)(1), unless the authorization is terminated or revoked.  Performed at [facility], [address], [place], [place] [address]      Imaging Studies  No results found. Medications   Scheduled Meds:  sodium  chloride   Intravenous Once   amiodarone  200 mg Oral BID   Chlorhexidine Gluconate Cloth  6 each Topical Daily   pantoprazole  40 mg Oral Daily   pravastatin  40 mg Oral q1800   tamsulosin  0.4 mg Oral Daily   vitamin B-12  1,000 mcg Oral Daily   Continuous Infusions:  sodium chloride irrigation       LOS: 6 days   Time spent: >35 minutes   Richarda Osmond, DO [facility] 09/07/2021, 8:04 AM   To contact the [facility] Attending or Consulting provider for this patient: Check the care team in [facility] for a) attending/consulting [place] provider listed and b) the [facility] team listed Log into [email or web address] and use [place]'s universal password to access. If you do not have the password, please contact the hospital operator. Locate the [facility] provider you are looking for under [facility] and page to a number that you can be directly reached. If you still have difficulty reaching the provider, please page the [facility] (Director on Call) for the Hospitalists listed on amion for assistance.

## 2021-09-07 NOTE — Progress Notes (Signed)
Patient alert and oriented x 3-4, denies pain with assess. Remains on CBI with clear, red tinged UOP to foley. Vitals stable, no respiratory distress on room air. Will continue to monitor.

## 2021-09-07 NOTE — Plan of Care (Signed)

## 2021-09-07 NOTE — Progress Notes (Signed)
Urology Inpatient Progress Note  Subjective: No acute events overnight.  He is afebrile and has been stably borderline hypotensive overnight. Creatinine up today, 1.93.  WBC count up today, 10.6.  Hemoglobin up today, 7.8. Foley catheter in place draining pink-tinged urine on slow drip CBI.  There is a section of darker, maroon-colored efflux in the more distal tubing, however there is no notable clot. Patient reports feeling well today.  He denies pain.  Anti-infectives: Anti-infectives (From admission, onward)    Start     Dose/Rate Route Frequency Ordered Stop   09/06/21 1153  ceFAZolin (ANCEF) 2-4 GM/100ML-% IVPB       Note to Pharmacy: Register, Karen   : cabinet override      09/06/21 1153 09/06/21 1337   09/06/21 1115  ceFAZolin (ANCEF) IVPB 2g/100 mL premix        2 g 200 mL/hr over 30 Minutes Intravenous On call to O.R. 09/06/21 1017 09/06/21 1335   09/06/21 1045  ceFAZolin (ANCEF) powder 2 g  Status:  Discontinued        2 g Other To Surgery 09/06/21 0951 09/06/21 1017   09/02/21 1100  cefTRIAXone (ROCEPHIN) 2 g in sodium chloride 0.9 % 100 mL IVPB  Status:  Discontinued        2 g 200 mL/hr over 30 Minutes Intravenous Every 24 hours 09/01/21 1224 09/02/21 1004   09/02/21 1100  cefTRIAXone (ROCEPHIN) 1 g in sodium chloride 0.9 % 100 mL IVPB  Status:  Discontinued        1 g 200 mL/hr over 30 Minutes Intravenous Every 24 hours 09/02/21 1004 09/03/21 1414   09/01/21 1045  cefTRIAXone (ROCEPHIN) 2 g in sodium chloride 0.9 % 100 mL IVPB        2 g 200 mL/hr over 30 Minutes Intravenous  Once 09/01/21 1044 09/01/21 1138       Current Facility-Administered Medications  Medication Dose Route Frequency Provider Last Rate Last Admin   0.9 %  sodium chloride infusion (Manually program via Guardrails IV Fluids)   Intravenous Once Stoioff, Scott C, MD       0.9 %  sodium chloride infusion (Manually program via Guardrails IV Fluids)   Intravenous Once Richarda Osmond, MD        acetaminophen (TYLENOL) tablet 1,000 mg  1,000 mg Oral Q6H PRN Stoioff, Scott C, MD   1,000 mg at 09/07/21 0059   albuterol (PROVENTIL) (2.5 MG/3ML) 0.083% nebulizer solution 2.5 mg  2.5 mg Nebulization Q6H PRN Stoioff, Scott C, MD       amiodarone (PACERONE) tablet 200 mg  200 mg Oral BID Stoioff, Scott C, MD   200 mg at 09/06/21 2209   Chlorhexidine Gluconate Cloth 2 % PADS 6 each  6 each Topical Daily Stoioff, Scott C, MD   6 each at 09/05/21 0853   ondansetron (ZOFRAN) tablet 4 mg  4 mg Oral Q6H PRN Stoioff, Scott C, MD       Or   ondansetron (ZOFRAN) injection 4 mg  4 mg Intravenous Q6H PRN Stoioff, Scott C, MD   4 mg at 09/06/21 1341   pantoprazole (PROTONIX) EC tablet 40 mg  40 mg Oral Daily Stoioff, Scott C, MD   40 mg at 09/05/21 0852   pravastatin (PRAVACHOL) tablet 40 mg  40 mg Oral q1800 Stoioff, Scott C, MD   40 mg at 09/05/21 1740   sodium chloride irrigation 0.9 % 3,000 mL  3,000 mL Irrigation Continuous Stoioff, Scott C,  MD   3,000 mL at 09/07/21 0506   tamsulosin (FLOMAX) capsule 0.4 mg  0.4 mg Oral Daily Stoioff, Scott C, MD   0.4 mg at 09/05/21 0852   vitamin B-12 (CYANOCOBALAMIN) tablet 1,000 mcg  1,000 mcg Oral Daily Stoioff, Scott C, MD   1,000 mcg at 09/05/21 0852   Objective: Vital signs in last 24 hours: Temp:  [97.2 F (36.2 C)-98.6 F (37 C)] 98.6 F (37 C) (09/21 0400) Pulse Rate:  [42-98] 67 (09/21 0400) Resp:  [16-22] 16 (09/21 0400) BP: (83-128)/(45-86) 94/54 (09/21 0400) SpO2:  [92 %-99 %] 95 % (09/21 0400) Weight:  [78 kg] 78 kg (09/20 1132)  Intake/Output from previous day: 09/20 0701 - 09/21 0700 In: 4490 [I.V.:850; Blood:340; IV Piggyback:100] Out: 1884 [Urine:7875] Intake/Output this shift: No intake/output data recorded.  Physical Exam Vitals and nursing note reviewed.  Constitutional:      General: He is not in acute distress.    Appearance: He is not ill-appearing, toxic-appearing or diaphoretic.  HENT:     Head: Normocephalic and  atraumatic.  Pulmonary:     Effort: Pulmonary effort is normal. No respiratory distress.  Skin:    General: Skin is warm and dry.  Neurological:     Mental Status: He is alert and oriented to person, place, and time.  Psychiatric:        Mood and Affect: Mood normal.        Behavior: Behavior normal.   Lab Results:  Recent Labs    09/06/21 0444 09/07/21 0430  WBC 7.3 10.6*  HGB 7.2* 7.8*  HCT 22.0* 23.0*  PLT 133* 125*   BMET Recent Labs    09/06/21 0444 09/07/21 0430  NA 137 136  K 4.0 4.6  CL 106 104  CO2 25 26  GLUCOSE 108* 172*  BUN 25* 31*  CREATININE 1.72* 1.93*  CALCIUM 7.7* 7.8*   Assessment & Plan: 87 year old male admitted with gross hematuria, hypotension, AMS, and AKI 1 day after Foley catheter placement in our clinic for urinary retention.  Eliquis held, however intermittent gross hematuria necessitated 2 units PRBC transfusion and he is now POD 1 from cystoscopy with clot evacuation and fulguration of bladder neck vessels with Dr. Bernardo Heater.  Efflux on slow drip CBI noted to be pink-tinged this morning.  With the presence of some darker maroon-colored efflux in the more distal tubing, I elected to manually irrigate his catheter at the bedside.  I instilled approximately 180 cc of sterile saline through the catheter in place and was able to evacuate a small clot fragment measuring less than 2 cc.  Efflux remains light pink to clear with this.  Will keep CBI off this morning.  Will return to the bedside at midday today for urine recheck.  If his urine remains light pink to clear off CBI, okay to discontinue CBI.  Debroah Loop, PA-C 09/07/2021

## 2021-09-07 NOTE — Progress Notes (Signed)
I returned to the bedside at midday today to recheck the patient's urine off CBI.  His CBI has been restarted since I rounded on him and has been on fast drip for an unclear duration.  Efflux is clear at this time.  I turned CBI down to slow drip and we will continue plans for titration to keep his urine light pink or lighter in color.  We will continue to monitor.

## 2021-09-07 NOTE — Progress Notes (Signed)
PT Cancellation Note  Patient Details Name: Michael Simon MRN: [id number] DOB: 10/14/1933   Cancelled Treatment:    Reason Eval/Treat Not Completed: Other (comment). Pt currently on CBI, and also pending a transfusion this AM per RN. PT to hold until patient is medically appropriate for exertional activity. Supine and seated exercise packets given to family in room.  Lieutenant Diego PT, DPT 10:20 AM,09/07/21

## 2021-09-08 DIAGNOSIS — I10 Essential (primary) hypertension: Secondary | ICD-10-CM

## 2021-09-08 DIAGNOSIS — E118 Type 2 diabetes mellitus with unspecified complications: Secondary | ICD-10-CM

## 2021-09-08 DIAGNOSIS — R338 Other retention of urine: Secondary | ICD-10-CM | POA: Diagnosis not present

## 2021-09-08 DIAGNOSIS — I959 Hypotension, unspecified: Secondary | ICD-10-CM | POA: Diagnosis not present

## 2021-09-08 DIAGNOSIS — R310 Gross hematuria: Secondary | ICD-10-CM | POA: Diagnosis not present

## 2021-09-08 DIAGNOSIS — D62 Acute posthemorrhagic anemia: Secondary | ICD-10-CM | POA: Diagnosis not present

## 2021-09-08 LAB — CBC
HCT: 24.5 % — ABNORMAL LOW (ref 39.0–52.0)
Hemoglobin: 8.1 g/dL — ABNORMAL LOW (ref 13.0–17.0)
MCH: 32.3 pg (ref 26.0–34.0)
MCHC: 33.1 g/dL (ref 30.0–36.0)
MCV: 97.6 fL (ref 80.0–100.0)
Platelets: 138 10*3/uL — ABNORMAL LOW (ref 150–400)
RBC: 2.51 MIL/uL — ABNORMAL LOW (ref 4.22–5.81)
RDW: 17.2 % — ABNORMAL HIGH (ref 11.5–15.5)
WBC: 9.2 10*3/uL (ref 4.0–10.5)
nRBC: 0 % (ref 0.0–0.2)

## 2021-09-08 LAB — BPAM RBC
Blood Product Expiration Date: 202209242359
Blood Product Expiration Date: 202210102359
ISSUE DATE / TIME: 202209201739
ISSUE DATE / TIME: 202209211026
Unit Type and Rh: 5100
Unit Type and Rh: 5100

## 2021-09-08 LAB — TYPE AND SCREEN
ABO/RH(D): O POS
Antibody Screen: NEGATIVE
Unit division: 0
Unit division: 0

## 2021-09-08 LAB — GLUCOSE, CAPILLARY
Glucose-Capillary: 140 mg/dL — ABNORMAL HIGH (ref 70–99)
Glucose-Capillary: 131 mg/dL — ABNORMAL HIGH (ref 70–99)
Glucose-Capillary: 167 mg/dL — ABNORMAL HIGH (ref 70–99)
Glucose-Capillary: 168 mg/dL — ABNORMAL HIGH (ref 70–99)
Glucose-Capillary: 159 mg/dL — ABNORMAL HIGH (ref 70–99)

## 2021-09-08 MED ORDER — MORPHINE SULFATE (PF) 2 MG/ML IV SOLN: 1.0000 mg | Freq: Once | INTRAVENOUS | Status: DC

## 2021-09-08 MED ORDER — MORPHINE SULFATE (PF) 2 MG/ML IV SOLN
1.0000 mg | Freq: Once | INTRAVENOUS | Status: AC
  Administered 2021-09-08: 1 mg via INTRAVENOUS
  Filled 2021-09-08: qty 1

## 2021-09-08 NOTE — Progress Notes (Signed)
PT Cancellation Note  Patient Details Name: Michael Simon MRN: [id number] DOB: 16-Sep-1933   Cancelled Treatment:    Reason Eval/Treat Not Completed: Other (comment). MD at bedside, reported that CBI is to be discontinued. Pt family requested to allow patient to eat breakfast prior to PT today, PT to re-attempt as able.    Lieutenant Diego PT, DPT 9:59 AM,09/08/21

## 2021-09-08 NOTE — Progress Notes (Addendum)
Urology Inpatient Progress Note  Subjective: No acute events overnight. Hemoglobin down today, 8.1. CBI had run dry at the time of my arrival this morning.  In the distal tubing there is a section of maroon-colored urine.  On further inspection, this does not appear to represent clot but rather blood product sediment and it drains freely from the tubing.  At the more proximal tubing, there is pink-tinged urine with scant blood product sediment. He denies abdominal pain but continues to report hip pain.  Anti-infectives: Anti-infectives (From admission, onward)    Start     Dose/Rate Route Frequency Ordered Stop   09/06/21 1153  ceFAZolin (ANCEF) 2-4 GM/100ML-% IVPB       Note to Pharmacy: Register, Karen   : cabinet override      09/06/21 1153 09/06/21 1337   09/06/21 1115  ceFAZolin (ANCEF) IVPB 2g/100 mL premix        2 g 200 mL/hr over 30 Minutes Intravenous On call to O.R. 09/06/21 1017 09/06/21 1335   09/06/21 1045  ceFAZolin (ANCEF) powder 2 g  Status:  Discontinued        2 g Other To Surgery 09/06/21 0951 09/06/21 1017   09/02/21 1100  cefTRIAXone (ROCEPHIN) 2 g in sodium chloride 0.9 % 100 mL IVPB  Status:  Discontinued        2 g 200 mL/hr over 30 Minutes Intravenous Every 24 hours 09/01/21 1224 09/02/21 1004   09/02/21 1100  cefTRIAXone (ROCEPHIN) 1 g in sodium chloride 0.9 % 100 mL IVPB  Status:  Discontinued        1 g 200 mL/hr over 30 Minutes Intravenous Every 24 hours 09/02/21 1004 09/03/21 1414   09/01/21 1045  cefTRIAXone (ROCEPHIN) 2 g in sodium chloride 0.9 % 100 mL IVPB        2 g 200 mL/hr over 30 Minutes Intravenous  Once 09/01/21 1044 09/01/21 1138       Current Facility-Administered Medications  Medication Dose Route Frequency Provider Last Rate Last Admin   0.9 %  sodium chloride infusion (Manually program via Guardrails IV Fluids)   Intravenous Once Stoioff, Scott C, MD       acetaminophen (TYLENOL) tablet 1,000 mg  1,000 mg Oral Q6H PRN Stoioff, Scott  C, MD   1,000 mg at 09/07/21 2324   albuterol (PROVENTIL) (2.5 MG/3ML) 0.083% nebulizer solution 2.5 mg  2.5 mg Nebulization Q6H PRN Stoioff, Scott C, MD       amiodarone (PACERONE) tablet 200 mg  200 mg Oral BID Stoioff, Scott C, MD   200 mg at 09/07/21 2141   Chlorhexidine Gluconate Cloth 2 % PADS 6 each  6 each Topical Daily Stoioff, Scott C, MD   6 each at 09/07/21 1100   ondansetron (ZOFRAN) tablet 4 mg  4 mg Oral Q6H PRN Stoioff, Scott C, MD       Or   ondansetron (ZOFRAN) injection 4 mg  4 mg Intravenous Q6H PRN Stoioff, Scott C, MD   4 mg at 09/06/21 1341   pantoprazole (PROTONIX) EC tablet 40 mg  40 mg Oral Daily Stoioff, Scott C, MD   40 mg at 09/07/21 0934   pravastatin (PRAVACHOL) tablet 40 mg  40 mg Oral q1800 Stoioff, Scott C, MD   40 mg at 09/07/21 1900   sodium chloride irrigation 0.9 % 3,000 mL  3,000 mL Irrigation Continuous Stoioff, Scott C, MD   3,000 mL at 09/07/21 0506   tamsulosin (FLOMAX) capsule 0.4 mg  0.4 mg Oral Daily Stoioff, Scott C, MD   0.4 mg at 09/07/21 0934   vitamin B-12 (CYANOCOBALAMIN) tablet 1,000 mcg  1,000 mcg Oral Daily Stoioff, Scott C, MD   1,000 mcg at 09/07/21 0934   Objective: Vital signs in last 24 hours: Temp:  [97.6 F (36.4 C)-98.4 F (36.9 C)] 98.2 F (36.8 C) (09/22 0812) Pulse Rate:  [47-109] 60 (09/22 0812) Resp:  [15-20] 15 (09/22 0812) BP: (95-235)/(57-205) 118/59 (09/22 0812) SpO2:  [85 %-100 %] 93 % (09/22 0812)  Intake/Output from previous day: 09/21 0701 - 09/22 0700 In: 2500 [Blood:400] Out: 2900 [Urine:2900] Intake/Output this shift: No intake/output data recorded.  Physical Exam Vitals and nursing note reviewed.  Constitutional:      General: He is not in acute distress.    Appearance: He is not ill-appearing, toxic-appearing or diaphoretic.  HENT:     Head: Normocephalic and atraumatic.  Pulmonary:     Effort: Pulmonary effort is normal. No respiratory distress.  Neurological:     Mental Status: He is alert and  oriented to person, place, and time.  Psychiatric:        Mood and Affect: Mood normal.        Behavior: Behavior normal.   Lab Results:  Recent Labs    09/07/21 1421 09/08/21 0411  WBC 12.3* 9.2  HGB 9.3* 8.1*  HCT 27.2* 24.5*  PLT 155 138*   BMET Recent Labs    09/06/21 0444 09/07/21 0430  NA 137 136  K 4.0 4.6  CL 106 104  CO2 25 26  GLUCOSE 108* 172*  BUN 25* 31*  CREATININE 1.72* 1.93*  CALCIUM 7.7* 7.8*   Assessment & Plan: 87 year old male admitted with gross hematuria, hypotension, AMS, and AKI 1 day after Foley catheter placement in our clinic for urinary retention.  Eliquis held, however he has continued to have intermittent gross hematuria, now POD 2 from cystoscopy with clot evacuation and fulguration of bladder neck vessels with Dr. Bernardo Heater.  He has received a total of 3 units of PRBCs this admission, most recently yesterday.  Despite transfusion yesterday, his hemoglobin is down again today.  I manually irrigated his Foley catheter with 60 cc of sterile water today with clear efflux.  I scanned his bladder with no evidence of retained urine.  Will d/c CBI at this point, an Foley can be manually irrigated as needed moving forward.  His ongoing hematuria has been improving and I do not think he is bleeding enough from the urinary tract to account for his hemoglobin drop.  At this point, I recommend further evaluation for alternative sources of bleeding including from the GI tract.  Recommendations: -Stop CBI, plug the irrigation port, and manually irrigate catheter as needed if it stops draining -Consider alternative sources of bleeding to account for ongoing blood loss anemia  Debroah Loop, PA-C 09/08/2021

## 2021-09-08 NOTE — Consult Note (Signed)
Michael Lame, MD [facility]  [address], [place], [place] [address] Phone: [phone] Fax : [phone]  Consultation  Referring Provider:     Dr. Ouida Sills Primary Care Physician:  [name], MD Primary Gastroenterologist:  Dr. Vicente Males         Reason for Consultation:     Anemia  Date of Admission:  09/01/2021 Date of Consultation:  09/08/2021         HPI:   Michael Simon is a 87 y.o. male Who was admitted with hematuria after having a catheter placed.  The patient was transfused 3 units of blood and I have spoken to the patient's daughter and his son and they reported that the patient had significant bleeding with blood in the urinary bag that was bright red and they reported to be the same color as the blood that the patient was getting during his transfusion.  The patient had received 3 units of blood and as of today the red urine in the catheter bag has gotten slightly less read as reported by the daughter.  Despite this there was a GI consult put in for a possible GI bleed.  The patient had a normal colonoscopy except for diverticulosis 2 years ago by Dr. Vicente Males.  The patient has no other GI symptoms and in fact the patient's son states that his stools have been remarkably normal without any black stools or bloody stools.  The patient denies any abdominal pain at the present time or any recent sign of blood loss from his rectum.  He denies any abdominal pain or any other GI symptoms for that matter.  Past Medical History:  Diagnosis Date  . Basal cell carcinoma 04/14/2021   left medial clavicle [facility] 05/31/2021  . Basal cell carcinoma 04/14/2021   nose tip   . Basal cell carcinoma (BCC) of skin of nose 02/15/2021  . CAD (coronary artery disease)   . Diverticulitis 2022  . HLD (hyperlipidemia)   . HTN (hypertension)   . LBBB (left bundle branch block)   . Myocardial infarction ([place])   . Osteoarthritis   . Type II diabetes mellitus ([place])     Past Surgical History:   Procedure Laterality Date  . CARDIAC CATHETERIZATION    . CATARACT EXTRACTION    . COLONOSCOPY    . COLONOSCOPY WITH PROPOFOL N/A 08/26/2019   Procedure: COLONOSCOPY WITH PROPOFOL;  Surgeon: [name], MD;  Location: [facility] ENDOSCOPY;  Service: Gastroenterology;  Laterality: N/A;  . COLONOSCOPY WITH PROPOFOL N/A 09/22/2019   Procedure: COLONOSCOPY WITH PROPOFOL;  Surgeon: [name], MD;  Location: [facility] ENDOSCOPY;  Service: Gastroenterology;  Laterality: N/A;  pediatric colonoscope needed  . CORONARY ARTERY BYPASS GRAFT  2012  . CYSTOSCOPY WITH FULGERATION N/A 09/06/2021   Procedure: CYSTOSCOPY WITH FULGERATION-Possible Bladder Biospy;  Surgeon: Abbie Sons, MD;  Location: [facility] ORS;  Service: Urology;  Laterality: N/A;  . EYE SURGERY    . INGUINAL HERNIA REPAIR    . JOINT REPLACEMENT     knees  . TRANSURETHRAL RESECTION OF BLADDER TUMOR N/A 09/06/2021   Procedure: TRANSURETHRAL RESECTION OF BLADDER TUMOR (TURBT)-Possible;  Surgeon: Abbie Sons, MD;  Location: [facility] ORS;  Service: Urology;  Laterality: N/A;    Prior to Admission medications   Medication Sig Start Date End Date Taking? Authorizing Provider  amiodarone (PACERONE) 200 MG tablet Take 1 tablet (200 mg total) by mouth 2 (two) times daily. 01/06/21  Yes [name], MD  apixaban [facility] [place])  2.5 MG TABS tablet Take 1 tablet (2.5 mg total) by mouth 2 (two) times daily. 01/06/21  Yes [name], MD  Cyanocobalamin 1000 MCG TBCR Take 1,000 mcg by mouth daily.    Yes [provider]  furosemide (LASIX) 20 MG tablet TAKE 1 TABLET BY MOUTH EVERY DAY 03/02/21  Yes Masoud, Viann Shove, MD  lisinopril (ZESTRIL) 30 MG tablet TAKE 1 TABLET BY MOUTH EVERY DAY 05/24/21  Yes Masoud, Viann Shove, MD  lovastatin (MEVACOR) 40 MG tablet TAKE 1 TABLET BY MOUTH EVERYDAY AT BEDTIME Patient taking differently: Take 40 mg by mouth at bedtime. 02/16/21  Yes Masoud, Viann Shove, MD  pantoprazole (PROTONIX) 40 MG tablet TAKE 1 TABLET BY MOUTH EVERY DAY  08/08/21  Yes Masoud, Viann Shove, MD  tamsulosin (FLOMAX) 0.4 MG CAPS capsule Take 1 capsule (0.4 mg total) by mouth daily. 08/31/21  Yes Masoud, Viann Shove, MD  TRESIBA FLEXTOUCH 100 UNIT/ML FlexTouch Pen INJECT 15 UNITS SUBCUTANEOUSLY DAILY 02/21/21  Yes [name], MD  albuterol (PROVENTIL) (2.5 MG/3ML) 0.083% nebulizer solution Take 3 mLs (2.5 mg total) by nebulization every 6 (six) hours as needed for wheezing or shortness of breath. Patient not taking: Reported on 09/01/2021 02/15/21   [name], MD  albuterol (VENTOLIN HFA) 108 (90 Base) MCG/ACT inhaler TAKE 2 PUFFS BY MOUTH EVERY 6 HOURS AS NEEDED FOR WHEEZE OR SHORTNESS OF BREATH Patient not taking: Reported on 09/01/2021 02/02/21   [name], MD  budesonide-formoterol [facility]) 80-4.5 MCG/ACT inhaler Inhale 2 puffs into the lungs 2 (two) times daily. Patient not taking: Reported on 09/01/2021 01/06/21   [name], MD  fluticasone [facility]) 50 MCG/ACT nasal spray Place 2 sprays into both nostrils daily. Patient not taking: Reported on 09/01/2021 01/07/21   [name], MD  Insulin Pen Needle (PEN NEEDLES) 33G X 4 MM MISC Use to administer insulin daily 01/11/21   [name], MD  loratadine (CLARITIN) 10 MG tablet Take 1 tablet (10 mg total) by mouth daily. Patient not taking: Reported on 09/01/2021 01/07/21   [name], MD  mupirocin ointment (BACTROBAN) 2 % Apply 1 application topically daily. With dressing changes Patient not taking: Reported on 09/01/2021 05/31/21   [name], MD  tiotropium (SPIRIVA HANDIHALER) 18 MCG inhalation capsule Place 1 capsule (18 mcg total) into inhaler and inhale daily. Patient not taking: Reported on 09/01/2021 01/06/21   [name], MD    Family History  Problem Relation Age of Onset  . Arthritis Mother   . Heart disease Mother   . Heart disease Other      Social History   Tobacco Use  . Smoking status: Former    Types: Cigarettes    Quit date: 03/03/2008    Years  since quitting: 13.5  . Smokeless tobacco: Never  Vaping Use  . Vaping Use: Never used  Substance Use Topics  . Alcohol use: No    Alcohol/week: 0.0 standard drinks  . Drug use: No    Allergies as of 09/01/2021  . (No Known Allergies)    Review of Systems:    All systems reviewed and negative except where noted in HPI.   Physical Exam:  Vital signs in last 24 hours: Temp:  [97.5 F (36.4 C)-98.4 F (36.9 C)] 97.7 F (36.5 C) (09/22 1635) Pulse Rate:  [54-70] 60 (09/22 1635) Resp:  [15-20] 15 (09/22 1134) BP: (95-120)/(58-92) 120/58 (09/22 1635) SpO2:  [79 %-95 %] 95 % (09/22 1635) Last BM Date: 09/04/21 General:   Pleasant,  cooperative in NAD Head:  Normocephalic and atraumatic. Eyes:   No icterus.   Conjunctiva pink. PERRLA. Ears:  Normal auditory acuity. Neck:  Supple; no masses or thyroidomegaly Lungs: Respirations even and unlabored. Lungs clear to auscultation bilaterally.   No wheezes, crackles, or rhonchi.  Heart:  Regular rate and rhythm;  Without murmur, clicks, rubs or gallops Abdomen:  Soft, nondistended, nontender. Normal bowel sounds. No appreciable masses or hepatomegaly.  No rebound or guarding.  Rectal:  Not performed. Msk:  Symmetrical without gross deformities.   Extremities:  Without edema, cyanosis or clubbing. Neurologic:  Alert and oriented x3;  grossly normal neurologically. Skin:  Intact without significant lesions or rashes. Cervical Nodes:  No significant cervical adenopathy. Psych:  Alert and cooperative. Normal affect.  LAB RESULTS: Recent Labs    09/07/21 0430 09/07/21 1421 09/08/21 0411  WBC 10.6* 12.3* 9.2  HGB 7.8* 9.3* 8.1*  HCT 23.0* 27.2* 24.5*  PLT 125* 155 138*   BMET Recent Labs    09/06/21 0444 09/07/21 0430  NA 137 136  K 4.0 4.6  CL 106 104  CO2 25 26  GLUCOSE 108* 172*  BUN 25* 31*  CREATININE 1.72* 1.93*  CALCIUM 7.7* 7.8*   LFT No results for input(s): PROT, ALBUMIN, AST, ALT, ALKPHOS, BILITOT, BILIDIR,  IBILI in the last 72 hours. PT/INR No results for input(s): LABPROT, INR in the last 72 hours.  STUDIES: No results found.    Impression / Plan:   Assessment: Principal Problem:   Acute blood loss anemia Active Problems:   Diabetes mellitus with complication (HCC)   Essential hypertension   Generalized weakness   Atrial flutter (HCC)   CKD stage 4 due to type 2 diabetes mellitus (HCC)   Hematuria, gross   Acute lower UTI   Acute blood loss anemia (ABLA)   Acute cystitis with hematuria   Michael Simon is a 87 y.o. y/o male with close to a week of gross hematuria with the need of transfusion of multiple pints of blood.  Despite the continued hematuria and brown stools without any GI sign of significant blood loss to explain a continued drop in hemoglobin a GI consult was called for a possible GI evaluation.  Plan:  I spoke to the family about a possible repeat colonoscopy despite the normal colonoscopy 2 years ago and proceeding with an upper endoscopy with the notion that if they were negative the patient may need to undergo a capsule endoscopy.  Both the daughter and the son voiced their wishes to not proceed to quickly to any endoscopic procedures since he significant drop in hemoglobin has not produced any signs of GI bleeding.  They would like to wait and see if his hemoglobin stabilizes when the bleeding from the urine stops.  There been told that I am on call this weekend and will follow him and if they change their mind or decide that a GI source is likely the cause of his anemia then we can always do a colonoscopy on Monday and prep him on Sunday.  The patient and his family have been explained the plan and agree with it.  Thank you for involving me in the care of this patient.      LOS: 7 days   Michael Lame, MD, [facility] 09/08/2021, 5:31 PM,  Pager [phone] 7am-5pm  Check AMION for 5pm -7am coverage and on weekends   Note: This dictation was prepared with Dragon  dictation along with smaller phrase technology. Any  transcriptional errors that result from this process are unintentional.

## 2021-09-08 NOTE — Progress Notes (Signed)
Patient alert and oriented x 3-4, complains of pain to right hip. No relief with prn tylenol, Hospitalist notified and one time order for IV Morphine provide. Patient verbalized relief with follow up. Remains on CBI with clear, bloody UOP with some blood clots. Vitals stable, no respiratory distress on room air. Will continue to monitor.

## 2021-09-08 NOTE — Progress Notes (Signed)
Physical Therapy Treatment Patient Details Name: Michael Simon MRN: [id number] DOB: 1933-07-06 Today's Date: 09/08/2021   History of Present Illness Pt is an 87 y.o. male with medical history significant for chronic urinary retention status post insertion of Foley catheter on 08/31/21, diabetes mellitus, hypertension, chronic A. fib on anticoagulation therapy who presents to the ER via EMS for evaluation of mental status changes. Also underwent cytoscopy and CBI this admission.    PT Comments    Patient alert, oriented to self, place, date, able to name family in the room. Sit <> stand from recliner with RW multiple times, supervision/CGA to come to standing, but mina for eccentric control to return to sitting. BP assessed, and pt orthostatic (see below for details, RN and MD notified), but pt very eager for mobility, ambulated ~75ft in the room with CGA and walker. Up in chair with family at  bedside. The patient would benefit from further skilled PT intervention to continue to progress towards goals. Recommendation remains appropriate.   112/63 sitting : 85/53 standing. reattempted with similar values. Pt asymptomatic and eager for ambulation; returned to sitting and BP 78/48. 98/86 at end of session   Recommendations for follow up therapy are one component of a multi-disciplinary discharge planning process, led by the attending physician.  Recommendations may be updated based on patient status, additional functional criteria and insurance authorization.  Follow Up Recommendations  Home health PT;Supervision for mobility/OOB     Equipment Recommendations  Rolling walker with 5" wheels    Recommendations for Other Services       Precautions / Restrictions Precautions Precautions: Fall Restrictions Weight Bearing Restrictions: No     Mobility  Bed Mobility               General bed mobility comments: pt up in chair at start of session    Transfers Overall transfer  level: Needs assistance Equipment used: Rolling walker (2 wheeled);None Transfers: Sit to/from Stand Sit to Stand: Min guard;Min assist         General transfer comment: progressed to standing with CGA, and minA for eccentric control to recliner each time, instructed in safe hand placement  Ambulation/Gait Ambulation/Gait assistance: Min guard Gait Distance (Feet): 18 Feet Assistive device: Rolling walker (2 wheeled) Gait Pattern/deviations: Trunk flexed;Narrow base of support;Decreased step length - right;Decreased step length - left         Stairs             Wheelchair Mobility    Modified Rankin (Stroke Patients Only)       Balance Overall balance assessment: Needs assistance Sitting-balance support: Feet supported Sitting balance-Leahy Scale: Good     Standing balance support: During functional activity;Bilateral upper extremity supported Standing balance-Leahy Scale: Fair Standing balance comment: requires BUE support for dynamic tasks                            Cognition Arousal/Alertness: Awake/alert Behavior During Therapy: WFL for tasks assessed/performed                                   General Comments: oriented to self, date of birth, family members in room. Seemed to be more clear mentally this session      Exercises Other Exercises Other Exercises: pt orthostatics (RN informed): 112/63 sitting : 85/53 standing. reattempted with similar values. Pt asymptomatic and eager for  ambulation, after 29ft in room returned to sitting and BP 78/48. 98/86 at end of session    General Comments        Pertinent Vitals/Pain Pain Assessment: No/denies pain    Home Living                      Prior Function            PT Goals (current goals can now be found in the care plan section) Progress towards PT goals: Progressing toward goals    Frequency    Min 2X/week      PT Plan Current plan remains  appropriate    Co-evaluation              AM-PAC PT "6 Clicks" Mobility   Outcome Measure  Help needed turning from your back to your side while in a flat bed without using bedrails?: A Little Help needed moving from lying on your back to sitting on the side of a flat bed without using bedrails?: A Little Help needed moving to and from a bed to a chair (including a wheelchair)?: A Little Help needed standing up from a chair using your arms (e.g., wheelchair or bedside chair)?: A Little Help needed to walk in hospital room?: A Little Help needed climbing 3-5 steps with a railing? : A Lot 6 Click Score: 17    End of Session Equipment Utilized During Treatment: Gait belt Activity Tolerance: Other (comment) (limited by BP) Patient left: in chair;with call bell/phone within reach;with chair alarm set Nurse Communication: Mobility status PT Visit Diagnosis: Other abnormalities of gait and mobility (R26.89);Difficulty in walking, not elsewhere classified (R26.2)     Time: 1031-1100 PT Time Calculation (min) (ACUTE ONLY): 29 min  Charges:  $Therapeutic Exercise: 8-22 mins $Therapeutic Activity: 8-22 mins                    Lieutenant Diego PT, DPT 11:25 AM,09/08/21

## 2021-09-08 NOTE — Progress Notes (Signed)
PROGRESS NOTE  Michael Simon    DOB: 1933-07-24, 87 y.o.  OFB:[id number]  PCP: [name], MD   Code Status: Full Code   DOA: 09/01/2021   LOS: 7  Brief Narrative of Current Hospitalization  Michael Simon is a 87 y.o. male with a PMH significant for chronic urinary retention status post insertion of Foley catheter on 08/31/21, diabetes mellitus, hypertension, chronic A. fib on anticoagulation. They presented from home to the ED on 09/01/2021 with AMS x1days. In the ED, it was found that they had acute anemia thought to be partially related to blood loss from traumatic urinary foley placement. They were treated with 2u pRBCs.  Patient was admitted to medicine service for further workup and management of anemia and urine retention as outlined in detail below.  09/08/21 -hgb 8.1 s/p third unit pRBCs yesterday  Assessment & Plan  Principal Problem:   Acute blood loss anemia Active Problems:   Diabetes mellitus (HCC)   Essential hypertension   Generalized weakness   Atrial flutter (HCC)   CKD stage 4 due to type 2 diabetes mellitus (HCC)   Hematuria, gross   Acute lower UTI   Acute blood loss anemia (ABLA)   Acute cystitis with hematuria  Acute urinary retention 2/2 BPH- resolved with foley placement (9/14). Cystoscopy with clot evacuation 9/20. Continues to have gross hematuria.  - f/u urology who is monitoring urine on CBI which was discontinued today. Now just with regular gravity foley.  - continue flomax  Anemia- s/p 2upRBCs. Received 3rd unit yesterday with good response to 9.3 but has now reduced to 8.1 again today. Possibly losing significant blood in urine overnight, however, urology consult does not think that blood loss in urine is large enough volume to be only/main factor of significant anemia. Patient and his son deny any signs of blood in stool. There are no other obvious sources of bleed. Patient is symptomatic with orthostatics. - CBC am - GI consulted.    Hypotension- Bps improved with transfusion yesterday. 104/63 this am at rest. Does not compensate with standing.  - continue holding home antihypertensives - consider IV fluid bolus vs another unit pRBCs if continues to be symptomatic  AKI on CKDIII- 2/2 obstruction- resolved.   H/o CAD s/p CABG -Continue pravastatin  COPD -Continue Spiriva, albuterol as needed  H/o A. fib possibly based off medications.  Continue amiodarone.  Eliquis is being held for active bleeding  DVT prophylaxis: SCDs Start: 09/01/21 1221   Diet:  Diet Orders (From admission, onward)     Start     Ordered   09/06/21 1542  Diet Heart Room service appropriate? Yes; Fluid consistency: Thin  Diet effective now       Question Answer Comment  Room service appropriate? Yes   Fluid consistency: Thin      09/06/21 1541            Subjective 09/08/21    Patient feels well today. His hip pain has improved with the pain medication. He has no symptoms at rest. Had normal non-bloody BM today.  Disposition Plan & Communication  Status is: Inpatient  Remains inpatient appropriate because:Hemodynamically unstable and Ongoing diagnostic testing needed not appropriate for outpatient work up  Dispo: The patient is from: Home              Anticipated d/c is to: home with home health              Patient currently is not medically  stable to d/c.   Difficult to place patient No  Family Communication: Son at bedside  Consults, Procedures, Significant Events  Consultants:  Urology GI  Procedures/significant events:  Cystography  Antimicrobials:  Anti-infectives (From admission, onward)    Start     Dose/Rate Route Frequency Ordered Stop   09/06/21 1153  ceFAZolin (ANCEF) 2-4 GM/100ML-% IVPB       Note to Pharmacy: Register, Karen   : cabinet override      09/06/21 1153 09/06/21 1337   09/06/21 1115  ceFAZolin (ANCEF) IVPB 2g/100 mL premix        2 g 200 mL/hr over 30 Minutes Intravenous On call to  O.R. 09/06/21 1017 09/06/21 1335   09/06/21 1045  ceFAZolin (ANCEF) powder 2 g  Status:  Discontinued        2 g Other To Surgery 09/06/21 0951 09/06/21 1017   09/02/21 1100  cefTRIAXone (ROCEPHIN) 2 g in sodium chloride 0.9 % 100 mL IVPB  Status:  Discontinued        2 g 200 mL/hr over 30 Minutes Intravenous Every 24 hours 09/01/21 1224 09/02/21 1004   09/02/21 1100  cefTRIAXone (ROCEPHIN) 1 g in sodium chloride 0.9 % 100 mL IVPB  Status:  Discontinued        1 g 200 mL/hr over 30 Minutes Intravenous Every 24 hours 09/02/21 1004 09/03/21 1414   09/01/21 1045  cefTRIAXone (ROCEPHIN) 2 g in sodium chloride 0.9 % 100 mL IVPB        2 g 200 mL/hr over 30 Minutes Intravenous  Once 09/01/21 1044 09/01/21 1138        Objective   Vitals:   09/07/21 1202 09/07/21 2014 09/07/21 2316 09/08/21 0545  BP: 122/72 (!) 117/92 95/65 104/63  Pulse: 72 65 70 (!) 54  Resp: 15 20 20 20   Temp:  98.4 F (36.9 C) 98.4 F (36.9 C) 97.8 F (36.6 C)  TempSrc:  Oral Oral   SpO2: (!) 85% 92% 92% 95%  Weight:      Height:        Intake/Output Summary (Last 24 hours) at 09/08/2021 0751 Last data filed at 09/08/2021 0556 Gross per 24 hour  Intake 2500 ml  Output 2900 ml  Net -400 ml    Filed Weights   09/01/21 1001 09/06/21 1132  Weight: 77 kg 78 kg    Patient BMI: Body mass index is 26.15 kg/m.   Physical Exam: General: awake, alert, NAD HEENT: atraumatic, clear conjunctiva, anicteric sclera, moist mucus membranes, hard of hearing Respiratory: normal respiratory effort. Cardiovascular: normal S1/S2, RRR Gastrointestinal: soft, NT, ND, no HSM felt Nervous: A&O x3. no gross focal neurologic deficits, normal speech Extremities: moves all equally, trace LE edema, normal tone Skin: dry, intact, normal temperature, normal color, No rashes, lesions or ulcers  Labs   I have personally reviewed following labs and imaging studies No results displayed because visit has over 200 results.        Recent Results (from the past 240 hour(s))  Urine Culture     Status: None   Collection Time: 09/01/21 10:04 AM   Specimen: Urine, Clean Catch  Result Value Ref Range Status   Specimen Description   Final    URINE, CLEAN CATCH Performed at [facility], [address] [place], [place] [address]    Special Requests   Final    NONE Performed at [facility], [address], [place], [place] [address]    Culture  Final    NO GROWTH Performed at [facility], [place] [address] [place], [place] [address]    Report Status 09/03/2021 FINAL  Final  Blood culture (routine x 2)     Status: None   Collection Time: 09/01/21 11:08 AM   Specimen: BLOOD  Result Value Ref Range Status   Specimen Description BLOOD  [facility]  Final   Special Requests   Final    BOTTLES DRAWN AEROBIC AND ANAEROBIC Blood Culture results may not be optimal due to an inadequate volume of blood received in culture bottles   Culture   Final    NO GROWTH 5 DAYS Performed at [facility], [address] [place], [place] [address]    Report Status 09/06/2021 FINAL  Final  Blood culture (routine x 2)     Status: None   Collection Time: 09/01/21 11:08 AM   Specimen: BLOOD  Result Value Ref Range Status   Specimen Description BLOOD RIGHT ANTECUBITAL  Final   Special Requests   Final    BOTTLES DRAWN AEROBIC AND ANAEROBIC Blood Culture adequate volume   Culture   Final    NO GROWTH 5 DAYS Performed at [facility], [address] [place], [place] [address]    Report Status 09/06/2021 FINAL  Final  Resp Panel by RT-PCR (Flu A&B, Covid) Nasopharyngeal Swab     Status: None   Collection Time: 09/01/21 12:53 PM   Specimen: Nasopharyngeal Swab; Nasopharyngeal(NP) swabs in vial transport medium  Result Value Ref Range Status   SARS Coronavirus 2 by RT PCR NEGATIVE NEGATIVE Final    Comment: (NOTE) SARS-CoV-2 target nucleic acids are NOT DETECTED.  The SARS-CoV-2 RNA is  generally detectable in upper respiratory specimens during the acute phase of infection. The lowest concentration of SARS-CoV-2 viral copies this assay can detect is 138 copies/mL. A negative result does not preclude SARS-Cov-2 infection and should not be used as the sole basis for treatment or other patient management decisions. A negative result may occur with  improper specimen collection/handling, submission of specimen other than nasopharyngeal swab, presence of viral mutation(s) within the areas targeted by this assay, and inadequate number of viral copies(<138 copies/mL). A negative result must be combined with clinical observations, patient history, and epidemiological information. The expected result is Negative.  Fact Sheet for Patients:  EntrepreneurPulse.com.au  Fact Sheet for Healthcare Providers:  IncredibleEmployment.be  This test is no t yet approved or cleared by the [place] FDA and  has been authorized for detection and/or diagnosis of SARS-CoV-2 by FDA under an Emergency Use Authorization (EUA). This EUA will remain  in effect (meaning this test can be used) for the duration of the COVID-19 declaration under Section 564(b)(1) of the Act, 21 U.S.C.section 360bbb-3(b)(1), unless the authorization is terminated  or revoked sooner.       Influenza A by PCR NEGATIVE NEGATIVE Final   Influenza B by PCR NEGATIVE NEGATIVE Final    Comment: (NOTE) The Xpert Xpress SARS-CoV-2/FLU/RSV plus assay is intended as an aid in the diagnosis of influenza from Nasopharyngeal swab specimens and should not be used as a sole basis for treatment. Nasal washings and aspirates are unacceptable for Xpert Xpress SARS-CoV-2/FLU/RSV testing.  Fact Sheet for Patients: EntrepreneurPulse.com.au  Fact Sheet for Healthcare Providers: IncredibleEmployment.be  This test is not yet approved or cleared by the [place] FDA and has been authorized for detection and/or diagnosis of SARS-CoV-2 by FDA under an Emergency Use Authorization (EUA). This  EUA will remain in effect (meaning this test can be used) for the duration of the COVID-19 declaration under Section 564(b)(1) of the Act, 21 U.S.C. section 360bbb-3(b)(1), unless the authorization is terminated or revoked.  Performed at [facility], [address], [place], [place] [address]      Imaging Studies  No results found. Medications   Scheduled Meds:  sodium chloride   Intravenous Once   amiodarone  200 mg Oral BID   Chlorhexidine Gluconate Cloth  6 each Topical Daily   pantoprazole  40 mg Oral Daily   pravastatin  40 mg Oral q1800   tamsulosin  0.4 mg Oral Daily   vitamin B-12  1,000 mcg Oral Daily   Continuous Infusions:  sodium chloride irrigation       LOS: 7 days   Time spent: >35 minutes   Richarda Osmond, DO [facility] 09/08/2021, 7:51 AM   To contact the [facility] Attending or Consulting provider for this patient: Check the care team in [facility] for a) attending/consulting [place] provider listed and b) the [facility] team listed Log into [email or web address] and use [place]'s universal password to access. If you do not have the password, please contact the hospital operator. Locate the [facility] provider you are looking for under [facility] and page to a number that you can be directly reached. If you still have difficulty reaching the provider, please page the [facility] (Director on Call) for the Hospitalists listed on amion for assistance.

## 2021-09-08 NOTE — Care Management Important Message (Signed)
Important Message  Patient Details  Name: Michael Simon MRN: [id number] Date of Birth: Jun 06, 1933   Medicare Important Message Given:  Yes     Juliann Pulse A Laneah Luft 09/08/2021, 11:08 AM

## 2021-09-09 ENCOUNTER — Inpatient Hospital Stay: Payer: Medicare HMO

## 2021-09-09 DIAGNOSIS — D62 Acute posthemorrhagic anemia: Secondary | ICD-10-CM | POA: Diagnosis not present

## 2021-09-09 LAB — CBC
HCT: 27.2 % — ABNORMAL LOW (ref 39.0–52.0)
Hemoglobin: 9 g/dL — ABNORMAL LOW (ref 13.0–17.0)
MCH: 31.8 pg (ref 26.0–34.0)
MCHC: 33.1 g/dL (ref 30.0–36.0)
MCV: 96.1 fL (ref 80.0–100.0)
Platelets: 149 10*3/uL — ABNORMAL LOW (ref 150–400)
RBC: 2.83 MIL/uL — ABNORMAL LOW (ref 4.22–5.81)
RDW: 16.7 % — ABNORMAL HIGH (ref 11.5–15.5)
WBC: 9.4 10*3/uL (ref 4.0–10.5)
nRBC: 0 % (ref 0.0–0.2)

## 2021-09-09 LAB — GLUCOSE, CAPILLARY
Glucose-Capillary: 140 mg/dL — ABNORMAL HIGH (ref 70–99)
Glucose-Capillary: 152 mg/dL — ABNORMAL HIGH (ref 70–99)
Glucose-Capillary: 187 mg/dL — ABNORMAL HIGH (ref 70–99)

## 2021-09-09 MED ORDER — MORPHINE SULFATE (PF) 2 MG/ML IV SOLN
1.0000 mg | Freq: Once | INTRAVENOUS | Status: AC
  Administered 2021-09-09: 1 mg via INTRAVENOUS
  Filled 2021-09-09: qty 1

## 2021-09-09 NOTE — TOC Progression Note (Signed)
Transition of Care [facility]) - Progression Note    Patient Details  Name: RUFUS BESKE MRN: [id number] Date of Birth: [birth year]  [facility] of [facility] CM/SW [place], RN Phone Number: 09/09/2021, 10:23 AM  Clinical Narrative:     The patient may have endoscopy and colonoscopy prior to DC, the patient remains inpatient and is set up with [facility] for [place] DME has been delivered, TOC to continue to Monitor  Expected Discharge Plan: [place] Barriers to Discharge: Continued Medical Work up  Expected Discharge Plan and Services Expected Discharge Plan: [place] Choice: [place] arrangements for the past 2 months: Single Family Home                                       Social Determinants of Health (SDOH) Interventions    Readmission Risk Interventions Readmission Risk Prevention Plan 09/04/2021  Transportation Screening Complete  PCP or Specialist Appt within 3-5 Days Complete  HRI or Home Care Consult Complete  Social Work Consult for [place] Planning/Counseling Complete  Palliative Care Screening Not Applicable  Medication Review Press photographer) Complete  Some recent data might be hidden

## 2021-09-09 NOTE — Progress Notes (Signed)
PROGRESS NOTE    Michael Simon  YYQ:[id number] DOB: 28-Oct-1933 DOA: 09/01/2021 PCP: [name], MD   Brief Narrative:  This 87 y.o. male with PMH significant for chronic urinary retention status post insertion of Foley catheter on 08/31/21, diabetes mellitus, hypertension, chronic A. fib on anticoagulation presented from home to the ED on 09/01/2021 with AMS x1 days. In the ED, it was found that he had acute anemia thought to be partially related to blood loss from traumatic urinary foley placement.  He was given 3 units of PRBC so far. Hb is now stable. Patient was admitted for gross hematuria possibly secondary to urethral trauma secondary to insertion of Foley catheter. Urology is consulted.  Patient was started on CBI and hematuria has now resolved. GI was consulted which recommended colonoscopy and endoscopy but patient wants to follow up outpatient GI.   Assessment & Plan:   Principal Problem:   Acute blood loss anemia Active Problems:   Diabetes mellitus with complication (HCC)   Essential hypertension   Generalized weakness   Atrial flutter (HCC)   CKD stage 4 due to type 2 diabetes mellitus (HCC)   Hematuria, gross   Acute lower UTI   Acute blood loss anemia (ABLA)   Acute cystitis with hematuria  Acute urinary retention secondary to BPH: Gross Hematuria :> Resolved. Urinary retention resolved with Foley insertion (9/14). Patient presented with gross hematuria. Urology was consulted. Patient underwent cystoscopy with clot evacuation on 9/20 but patient continued to have gross hematuria.   He was started on CBI, hematuria has now resolved.  CBI discontinued.  Continue Flomax. Urology signed off, outpatient voiding trial scheduled for 9/27.  Normochromic normocytic anemia: Could be due to gross hematuria from traumatic catheterization. S/p 3 PRBCs,  now hemoglobin is stable at 9.0. Hematuria  has resolved. Patient and his son deny any signs of blood in stool.   There are no other obvious sources of bleed. Patient is symptomatic with orthostatics. GI is consulted, recommended colonoscopy and EGD but patient wants to wait.    Hypotension: BP has now improved with transfusion. Continue to hold antihypertensives.  AKI on CKD stage IIIa: Could be secondary to obstruction.  Now resolved   History of CAD, s/p CABG Continue pravastatin  COPD Continue Spiriva, albuterol as needed  History of A. fib :  Continue amiodarone.  Eliquis is being held for active bleeding.  DVT prophylaxis: SCDs Code Status: Full code Family Communication: Daughter at bedside Disposition Plan:  Status is: Inpatient  Remains inpatient appropriate because:Inpatient level of care appropriate due to severity of illness  Dispo: The patient is from: Home              Anticipated d/c is to: Home              Patient currently is not medically stable to d/c.   Difficult to place patient No  Consultants:  Urology GI  Procedures: Cystoscopy and fulguration.  Antimicrobials:  Anti-infectives (From admission, onward)    Start     Dose/Rate Route Frequency Ordered Stop   09/06/21 1153  ceFAZolin (ANCEF) 2-4 GM/100ML-% IVPB       Note to Pharmacy: Register, Karen   : cabinet override      09/06/21 1153 09/06/21 1337   09/06/21 1115  ceFAZolin (ANCEF) IVPB 2g/100 mL premix        2 g 200 mL/hr over 30 Minutes Intravenous On call to O.R. 09/06/21 1017 09/06/21 1335   09/06/21 1045  ceFAZolin (ANCEF) powder 2 g  Status:  Discontinued        2 g Other To Surgery 09/06/21 0951 09/06/21 1017   09/02/21 1100  cefTRIAXone (ROCEPHIN) 2 g in sodium chloride 0.9 % 100 mL IVPB  Status:  Discontinued        2 g 200 mL/hr over 30 Minutes Intravenous Every 24 hours 09/01/21 1224 09/02/21 1004   09/02/21 1100  cefTRIAXone (ROCEPHIN) 1 g in sodium chloride 0.9 % 100 mL IVPB  Status:  Discontinued        1 g 200 mL/hr over 30 Minutes Intravenous Every 24 hours 09/02/21 1004  09/03/21 1414   09/01/21 1045  cefTRIAXone (ROCEPHIN) 2 g in sodium chloride 0.9 % 100 mL IVPB        2 g 200 mL/hr over 30 Minutes Intravenous  Once 09/01/21 1044 09/01/21 1138       Subjective: Patient was seen and examined at bedside.  Overnight events noted.   He was sitting in the chair,  eating his breakfast.  He reports hematuria has resolved. His hemoglobin remains stable.  He does not want to have colonoscopy or endoscopy at this time.  Objective: Vitals:   09/08/21 2046 09/09/21 0052 09/09/21 0417 09/09/21 0816  BP: 110/63 114/66 105/79 125/77  Pulse: (!) 51 63 (!) 59 66  Resp: 20 18 20 16   Temp: 98.2 F (36.8 C) (!) 97.5 F (36.4 C) 97.9 F (36.6 C) 97.6 F (36.4 C)  TempSrc:      SpO2: 97% 97% 95% 98%  Weight:      Height:        Intake/Output Summary (Last 24 hours) at 09/09/2021 1429 Last data filed at 09/09/2021 1300 Gross per 24 hour  Intake 600 ml  Output 2250 ml  Net -1650 ml   Filed Weights   09/01/21 1001 09/06/21 1132  Weight: 77 kg 78 kg    Examination:  General exam: Appears comfortable, not in any acute distress. Respiratory system: Clear to auscultation bilaterally, no wheezing no crackles. Cardiovascular system: S1-S2 heard, regular rate and rhythm, no murmur. Gastrointestinal system: Abdomen is soft, nontender nondistended, BS+ Central nervous system: Alert and oriented x 3 . No focal neurological deficits. Extremities: No edema, no cyanosis, no clubbing. Skin: No rashes, lesions or ulcers Psychiatry: Judgement and insight appear normal. Mood & affect appropriate.     Data Reviewed: I have personally reviewed following labs and imaging studies  CBC: Recent Labs  Lab 09/06/21 0444 09/07/21 0430 09/07/21 1421 09/08/21 0411 09/09/21 0508  WBC 7.3 10.6* 12.3* 9.2 9.4  HGB 7.2* 7.8* 9.3* 8.1* 9.0*  HCT 22.0* 23.0* 27.2* 24.5* 27.2*  MCV 102.3* 97.9 95.8 97.6 96.1  PLT 133* 125* 155 138* 638*   Basic Metabolic Panel: Recent  Labs  Lab 09/03/21 0516 09/04/21 0530 09/05/21 0426 09/06/21 0444 09/07/21 0430  NA 135 136 135 137 136  K 3.9 4.0 4.1 4.0 4.6  CL 103 101 103 106 104  CO2 24 27 27 25 26   GLUCOSE 133* 125* 135* 108* 172*  BUN 36* 28* 28* 25* 31*  CREATININE 1.95* 1.66* 1.83* 1.72* 1.93*  CALCIUM 8.0* 8.0* 8.2* 7.7* 7.8*   GFR: Estimated Creatinine Clearance: 26.6 mL/min (A) (by C-G formula based on SCr of 1.93 mg/dL (H)). Liver Function Tests: No results for input(s): AST, ALT, ALKPHOS, BILITOT, PROT, ALBUMIN in the last 168 hours. No results for input(s): LIPASE, AMYLASE in the last 168 hours. No results for input(s):  AMMONIA in the last 168 hours. Coagulation Profile: No results for input(s): INR, PROTIME in the last 168 hours. Cardiac Enzymes: No results for input(s): CKTOTAL, CKMB, CKMBINDEX, TROPONINI in the last 168 hours. BNP (last 3 results) No results for input(s): PROBNP in the last 8760 hours. HbA1C: No results for input(s): HGBA1C in the last 72 hours. CBG: Recent Labs  Lab 09/08/21 1136 09/08/21 1637 09/08/21 2203 09/09/21 0635 09/09/21 0809  GLUCAP 167* 168* 159* 140* 152*   Lipid Profile: No results for input(s): CHOL, HDL, LDLCALC, TRIG, CHOLHDL, LDLDIRECT in the last 72 hours. Thyroid Function Tests: No results for input(s): TSH, T4TOTAL, FREET4, T3FREE, THYROIDAB in the last 72 hours. Anemia Panel: No results for input(s): VITAMINB12, FOLATE, FERRITIN, TIBC, IRON, RETICCTPCT in the last 72 hours. Sepsis Labs: No results for input(s): PROCALCITON, LATICACIDVEN in the last 168 hours.  Recent Results (from the past 240 hour(s))  Urine Culture     Status: None   Collection Time: 09/01/21 10:04 AM   Specimen: Urine, Clean Catch  Result Value Ref Range Status   Specimen Description   Final    URINE, CLEAN CATCH Performed at [facility], [address], [place], [place] [address]    Special Requests   Final    NONE Performed at [facility],  [address] [place], [place] [address]    Culture   Final    NO GROWTH Performed at [facility], [place] [address], [place], [place] [address]    Report Status 09/03/2021 FINAL  Final  Blood culture (routine x 2)     Status: None   Collection Time: 09/01/21 11:08 AM   Specimen: BLOOD  Result Value Ref Range Status   Specimen Description BLOOD  [facility]  Final   Special Requests   Final    BOTTLES DRAWN AEROBIC AND ANAEROBIC Blood Culture results may not be optimal due to an inadequate volume of blood received in culture bottles   Culture   Final    NO GROWTH 5 DAYS Performed at [facility], [address], [place], [place] [address]    Report Status 09/06/2021 FINAL  Final  Blood culture (routine x 2)     Status: None   Collection Time: 09/01/21 11:08 AM   Specimen: BLOOD  Result Value Ref Range Status   Specimen Description BLOOD RIGHT ANTECUBITAL  Final   Special Requests   Final    BOTTLES DRAWN AEROBIC AND ANAEROBIC Blood Culture adequate volume   Culture   Final    NO GROWTH 5 DAYS Performed at [facility], [address] [place],  [address]    Report Status 09/06/2021 FINAL  Final  Resp Panel by RT-PCR (Flu A&B, Covid) Nasopharyngeal Swab     Status: None   Collection Time: 09/01/21 12:53 PM   Specimen: Nasopharyngeal Swab; Nasopharyngeal(NP) swabs in vial transport medium  Result Value Ref Range Status   SARS Coronavirus 2 by RT PCR NEGATIVE NEGATIVE Final    Comment: (NOTE) SARS-CoV-2 target nucleic acids are NOT DETECTED.  The SARS-CoV-2 RNA is generally detectable in upper respiratory specimens during the acute phase of infection. The lowest concentration of SARS-CoV-2 viral copies this assay can detect is 138 copies/mL. A negative result does not preclude SARS-Cov-2 infection and should not be used as the sole basis for treatment or other patient management decisions. A negative result may occur with  improper specimen  collection/handling, submission of specimen other than nasopharyngeal swab, presence  of viral mutation(s) within the areas targeted by this assay, and inadequate number of viral copies(<138 copies/mL). A negative result must be combined with clinical observations, patient history, and epidemiological information. The expected result is Negative.  Fact Sheet for Patients:  EntrepreneurPulse.com.au  Fact Sheet for Healthcare Providers:  IncredibleEmployment.be  This test is no t yet approved or cleared by the [place] FDA and  has been authorized for detection and/or diagnosis of SARS-CoV-2 by FDA under an Emergency Use Authorization (EUA). This EUA will remain  in effect (meaning this test can be used) for the duration of the COVID-19 declaration under Section 564(b)(1) of the Act, 21 U.S.C.section 360bbb-3(b)(1), unless the authorization is terminated  or revoked sooner.       Influenza A by PCR NEGATIVE NEGATIVE Final   Influenza B by PCR NEGATIVE NEGATIVE Final    Comment: (NOTE) The Xpert Xpress SARS-CoV-2/FLU/RSV plus assay is intended as an aid in the diagnosis of influenza from Nasopharyngeal swab specimens and should not be used as a sole basis for treatment. Nasal washings and aspirates are unacceptable for Xpert Xpress SARS-CoV-2/FLU/RSV testing.  Fact Sheet for Patients: EntrepreneurPulse.com.au  Fact Sheet for Healthcare Providers: IncredibleEmployment.be  This test is not yet approved or cleared by the [place] FDA and has been authorized for detection and/or diagnosis of SARS-CoV-2 by FDA under an Emergency Use Authorization (EUA). This EUA will remain in effect (meaning this test can be used) for the duration of the COVID-19 declaration under Section 564(b)(1) of the Act, 21 U.S.C. section 360bbb-3(b)(1), unless the authorization is terminated or revoked.  Performed at [facility], [address] [place], [place] [address]     Radiology Studies: No results found.  Scheduled Meds:  amiodarone  200 mg Oral BID   Chlorhexidine Gluconate Cloth  6 each Topical Daily   pantoprazole  40 mg Oral Daily   pravastatin  40 mg Oral q1800   tamsulosin  0.4 mg Oral Daily   vitamin B-12  1,000 mcg Oral Daily   Continuous Infusions:   LOS: 8 days    Time spent: 35 mins    Taleeya Blondin, MD [facility]   If 7PM-7AM, please contact night-coverage

## 2021-09-09 NOTE — Progress Notes (Signed)
Patient alert and oriented, complains of ongoing pain to right hip. Hospitalist ordered one time dose of Morphine which seem to relieve pain. Foley remains patent with pink tinged urine output, sediment and small clots noted intermittently to drainage bag. Vitals stable, no respiratory distress on room air. Will continue to monitor.

## 2021-09-09 NOTE — Progress Notes (Signed)
Patient laying on his right side when left side of ribs started popping. Patient turned on back side and popping stopped. Vital signs stable, no complaints of shortness of breath. Dr. Dwyane Dee notified. Verbal order for 2 view chest xray. Order placed by this nurse per MD request. Dr. Dwyane Dee states to notify nocturnalist when chest xray comes back. HS nurse Cassey aware of situation and MD request to notify nocturnalist when chest XR comes back.

## 2021-09-09 NOTE — Progress Notes (Signed)
Physical Therapy Treatment Patient Details Name: Michael Simon MRN: [id number] DOB: 1933/07/23 Today's Date: 09/09/2021   History of Present Illness Pt is an 87 y.o. male with medical history significant for chronic urinary retention status post insertion of Foley catheter on 08/31/21, diabetes mellitus, hypertension, chronic A. fib on anticoagulation therapy who presents to the ER via EMS for evaluation of mental status changes. Also underwent cytoscopy and CBI this admission.    PT Comments    Patient alert, agreeable to PT, family at bedside. Pt oriented to self, location (hospital, [place]), family. Pt denied pain during session, no pain signs/symptoms noted.  The patient demonstrated supine to sit with modA, reliant on UE support to complete as well as tactile cues for sequencing. Sit <> stand several times with RW, progressed to CGA with adequate UE support. MinA to return to sitting despite education and assistance with hand placement. He was able to ambulate ~61ft but still very limited in mobility due to orthostatic BP readings (pt appeared to be a bit more symptomatic today). The patient would benefit from further skilled PT intervention to continue to progress towards goals. Recommendation remains appropriate.   Vitals (BP, HR) (supine) 95/46  71  (seated EOB) 104/68, 84  seated, after transfer to chair) 85/54, 72   (seated post-seated exercise) 103/55, 81  (seated post-standing march) 102/73  (static standing) 86/54 w/ sx tremulous & increased fatigue (seated, post-session) 116/77      Recommendations for follow up therapy are one component of a multi-disciplinary discharge planning process, led by the attending physician.  Recommendations may be updated based on patient status, additional functional criteria and insurance authorization.  Follow Up Recommendations  Home health PT;Supervision for mobility/OOB     Equipment Recommendations  Rolling walker with 5"  wheels    Recommendations for Other Services       Precautions / Restrictions Precautions Precautions: Fall Restrictions Weight Bearing Restrictions: No     Mobility  Bed Mobility Overal bed mobility: Needs Assistance Bed Mobility: Supine to Sit     Supine to sit: Mod assist          Transfers Overall transfer level: Needs assistance Equipment used: Rolling walker (2 wheeled) Transfers: Sit to/from Stand Sit to Stand: Min guard;Min assist         General transfer comment: progressed to being able to stand with CGA only, initially minA  Ambulation/Gait   Gait Distance (Feet): 5 Feet Assistive device: Rolling walker (2 wheeled) Gait Pattern/deviations: Trunk flexed;Narrow base of support;Decreased step length - right;Decreased step length - left         Stairs             Wheelchair Mobility    Modified Rankin (Stroke Patients Only)       Balance Overall balance assessment: Needs assistance Sitting-balance support: Feet supported Sitting balance-Leahy Scale: Good     Standing balance support: During functional activity;Bilateral upper extremity supported Standing balance-Leahy Scale: Fair Standing balance comment: requires BUE support for dynamic tasks                            Cognition Arousal/Alertness: Awake/alert Behavior During Therapy: WFL for tasks assessed/performed                                   General Comments: oriented to self, date of birth, family members in  room. Seemed to be more clear mentally this session      Exercises Other Exercises Other Exercises: Pt still orthostatic this session, limited ability to ambulate    General Comments        Pertinent Vitals/Pain Pain Assessment: No/denies pain    Home Living                      Prior Function            PT Goals (current goals can now be found in the care plan section) Progress towards PT goals: Progressing toward  goals    Frequency    Min 2X/week      PT Plan Current plan remains appropriate    Co-evaluation              AM-PAC PT "6 Clicks" Mobility   Outcome Measure  Help needed turning from your back to your side while in a flat bed without using bedrails?: A Little Help needed moving from lying on your back to sitting on the side of a flat bed without using bedrails?: A Lot Help needed moving to and from a bed to a chair (including a wheelchair)?: A Little Help needed standing up from a chair using your arms (e.g., wheelchair or bedside chair)?: A Little Help needed to walk in hospital room?: A Little Help needed climbing 3-5 steps with a railing? : A Lot 6 Click Score: 16    End of Session Equipment Utilized During Treatment: Gait belt Activity Tolerance: Other (comment) (limited by BP) Patient left: in chair;with call bell/phone within reach;with chair alarm set;with nursing/sitter in room Nurse Communication: Mobility status PT Visit Diagnosis: Other abnormalities of gait and mobility (R26.89);Difficulty in walking, not elsewhere classified (R26.2)     Time: 7096-2836 PT Time Calculation (min) (ACUTE ONLY): 28 min  Charges:  $Therapeutic Exercise: 23-37 mins                     Lieutenant Diego PT, DPT 2:34 PM,09/09/21

## 2021-09-09 NOTE — Progress Notes (Signed)
Urology Inpatient Progress Note  Subjective: No acute events overnight.  He was seen by GI yesterday, who offered colonoscopy and possible upper endoscopy for further evaluation of alternative sources of bleeding. Hemoglobin up today, 9.0, not requiring transfusion yesterday. Foley catheter in place draining clear, yellow urine. He reports ongoing, intermittent right hip pain.  Anti-infectives: Anti-infectives (From admission, onward)    Start     Dose/Rate Route Frequency Ordered Stop   09/06/21 1153  ceFAZolin (ANCEF) 2-4 GM/100ML-% IVPB       Note to Pharmacy: Register, Karen   : cabinet override      09/06/21 1153 09/06/21 1337   09/06/21 1115  ceFAZolin (ANCEF) IVPB 2g/100 mL premix        2 g 200 mL/hr over 30 Minutes Intravenous On call to O.R. 09/06/21 1017 09/06/21 1335   09/06/21 1045  ceFAZolin (ANCEF) powder 2 g  Status:  Discontinued        2 g Other To Surgery 09/06/21 0951 09/06/21 1017   09/02/21 1100  cefTRIAXone (ROCEPHIN) 2 g in sodium chloride 0.9 % 100 mL IVPB  Status:  Discontinued        2 g 200 mL/hr over 30 Minutes Intravenous Every 24 hours 09/01/21 1224 09/02/21 1004   09/02/21 1100  cefTRIAXone (ROCEPHIN) 1 g in sodium chloride 0.9 % 100 mL IVPB  Status:  Discontinued        1 g 200 mL/hr over 30 Minutes Intravenous Every 24 hours 09/02/21 1004 09/03/21 1414   09/01/21 1045  cefTRIAXone (ROCEPHIN) 2 g in sodium chloride 0.9 % 100 mL IVPB        2 g 200 mL/hr over 30 Minutes Intravenous  Once 09/01/21 1044 09/01/21 1138       Current Facility-Administered Medications  Medication Dose Route Frequency Provider Last Rate Last Admin   acetaminophen (TYLENOL) tablet 1,000 mg  1,000 mg Oral Q6H PRN Stoioff, Scott C, MD   1,000 mg at 09/08/21 2124   albuterol (PROVENTIL) (2.5 MG/3ML) 0.083% nebulizer solution 2.5 mg  2.5 mg Nebulization Q6H PRN Stoioff, Scott C, MD       amiodarone (PACERONE) tablet 200 mg  200 mg Oral BID Stoioff, Scott C, MD   200 mg at  09/08/21 2124   Chlorhexidine Gluconate Cloth 2 % PADS 6 each  6 each Topical Daily Stoioff, Scott C, MD   6 each at 09/08/21 1000   ondansetron (ZOFRAN) tablet 4 mg  4 mg Oral Q6H PRN Stoioff, Scott C, MD       Or   ondansetron (ZOFRAN) injection 4 mg  4 mg Intravenous Q6H PRN Stoioff, Scott C, MD   4 mg at 09/06/21 1341   pantoprazole (PROTONIX) EC tablet 40 mg  40 mg Oral Daily Stoioff, Scott C, MD   40 mg at 09/08/21 1732   pravastatin (PRAVACHOL) tablet 40 mg  40 mg Oral q1800 Stoioff, Scott C, MD   40 mg at 09/08/21 1922   tamsulosin (FLOMAX) capsule 0.4 mg  0.4 mg Oral Daily Stoioff, Scott C, MD   0.4 mg at 09/08/21 1733   vitamin B-12 (CYANOCOBALAMIN) tablet 1,000 mcg  1,000 mcg Oral Daily Stoioff, Scott C, MD   1,000 mcg at 09/08/21 1733   Objective: Vital signs in last 24 hours: Temp:  [97.5 F (36.4 C)-98.2 F (36.8 C)] 97.6 F (36.4 C) (09/23 0816) Pulse Rate:  [51-70] 66 (09/23 0816) Resp:  [15-20] 16 (09/23 0816) BP: (105-125)/(58-87) 125/77 (09/23 0816) SpO2:  [  79 %-98 %] 98 % (09/23 0816)  Intake/Output from previous day: 09/22 0701 - 09/23 0700 In: -  Out: 2250 [Urine:2250] Intake/Output this shift: No intake/output data recorded.  Physical Exam Vitals and nursing note reviewed.  Constitutional:      General: He is not in acute distress.    Appearance: He is not ill-appearing, toxic-appearing or diaphoretic.  HENT:     Head: Normocephalic and atraumatic.  Pulmonary:     Effort: Pulmonary effort is normal. No respiratory distress.  Skin:    General: Skin is warm and dry.  Neurological:     Mental Status: He is alert. Mental status is at baseline.  Psychiatric:        Mood and Affect: Mood normal.        Behavior: Behavior normal.   Lab Results:  Recent Labs    09/08/21 0411 09/09/21 0508  WBC 9.2 9.4  HGB 8.1* 9.0*  HCT 24.5* 27.2*  PLT 138* 149*   BMET Recent Labs    09/07/21 0430  NA 136  K 4.6  CL 104  CO2 26  GLUCOSE 172*  BUN 31*   CREATININE 1.93*  CALCIUM 7.8*   Assessment & Plan: 87 year old male admitted with gross hematuria, hypotension, AMS, and AKI 1 day after Foley catheter placement in our clinic for urinary retention.  Eliquis held and he is now POD 3 from cystoscopy with clot evacuation and fulguration of bladder neck vessels with Dr. Bernardo Heater.  He has received a total of 3 units of PRBCs this admission, most recently 2 days ago, and CBI has now been discontinued.  Gross hematuria appears to have resolved this morning.  Hemoglobin is uptrending.  No further urologic intervention indicated.  Recommendations: -Trend CBC -Outpatient voiding trial 9/27 (already scheduled)  Debroah Loop, PA-C 09/09/2021

## 2021-09-09 NOTE — Progress Notes (Signed)
Michael Lame, MD [facility]   [address] [place], [place] [address] Phone: [phone] Fax : [phone]   Subjective: The patient's hemoglobin is stable and his urine today and his Foley catheter bag is yellow without the previously seen blood in it.  The patient's MCV has been normal indicating that this is not a chronic anemia therefore unlikely to be from a GI source of bleeding without overt black or bloody stools.   Objective: Vital signs in last 24 hours: Vitals:   09/09/21 0052 09/09/21 0417 09/09/21 0816 09/09/21 1620  BP: 114/66 105/79 125/77 (!) 106/53  Pulse: 63 (!) 59 66 94  Resp: 18 20 16 16   Temp: (!) 97.5 F (36.4 C) 97.9 F (36.6 C) 97.6 F (36.4 C) 98.6 F (37 C)  TempSrc:      SpO2: 97% 95% 98% 100%  Weight:      Height:       Weight change:   Intake/Output Summary (Last 24 hours) at 09/09/2021 1850 Last data filed at 09/09/2021 1849 Gross per 24 hour  Intake 840 ml  Output 3450 ml  Net -2610 ml     Exam: Heart:: Regular rate and rhythm, S1S2 present, or without murmur or extra heart sounds Lungs: normal and clear to auscultation and percussion Abdomen: soft, nontender, normal bowel sounds   Lab Results: @LABTEST2 @ Micro Results: Recent Results (from the past 240 hour(s))  Urine Culture     Status: None   Collection Time: 09/01/21 10:04 AM   Specimen: Urine, Clean Catch  Result Value Ref Range Status   Specimen Description   Final    URINE, CLEAN CATCH Performed at [facility], [address] [place], [place] [address]    Special Requests   Final    NONE Performed at [facility], [address], [place], [place] [address]    Culture   Final    NO GROWTH Performed at [facility], [place] [address], [place], [place] [address]    Report Status 09/03/2021 FINAL  Final  Blood culture (routine x 2)     Status: None   Collection Time: 09/01/21 11:08 AM   Specimen: BLOOD  Result Value Ref Range Status    Specimen Description BLOOD  [facility]  Final   Special Requests   Final    BOTTLES DRAWN AEROBIC AND ANAEROBIC Blood Culture results may not be optimal due to an inadequate volume of blood received in culture bottles   Culture   Final    NO GROWTH 5 DAYS Performed at [facility], [address] [place], [place] [address]    Report Status 09/06/2021 FINAL  Final  Blood culture (routine x 2)     Status: None   Collection Time: 09/01/21 11:08 AM   Specimen: BLOOD  Result Value Ref Range Status   Specimen Description BLOOD RIGHT ANTECUBITAL  Final   Special Requests   Final    BOTTLES DRAWN AEROBIC AND ANAEROBIC Blood Culture adequate volume   Culture   Final    NO GROWTH 5 DAYS Performed at [facility], [address], [place],  [address]    Report Status 09/06/2021 FINAL  Final  Resp Panel by RT-PCR (Flu A&B, Covid) Nasopharyngeal Swab     Status: None   Collection Time: 09/01/21 12:53 PM   Specimen: Nasopharyngeal Swab; Nasopharyngeal(NP) swabs in vial transport medium  Result Value Ref Range Status   SARS Coronavirus 2 by RT PCR NEGATIVE NEGATIVE  Final    Comment: (NOTE) SARS-CoV-2 target nucleic acids are NOT DETECTED.  The SARS-CoV-2 RNA is generally detectable in upper respiratory specimens during the acute phase of infection. The lowest concentration of SARS-CoV-2 viral copies this assay can detect is 138 copies/mL. A negative result does not preclude SARS-Cov-2 infection and should not be used as the sole basis for treatment or other patient management decisions. A negative result may occur with  improper specimen collection/handling, submission of specimen other than nasopharyngeal swab, presence of viral mutation(s) within the areas targeted by this assay, and inadequate number of viral copies(<138 copies/mL). A negative result must be combined with clinical observations, patient history, and epidemiological information. The expected result is  Negative.  Fact Sheet for Patients:  EntrepreneurPulse.com.au  Fact Sheet for Healthcare Providers:  IncredibleEmployment.be  This test is no t yet approved or cleared by the [place] FDA and  has been authorized for detection and/or diagnosis of SARS-CoV-2 by FDA under an Emergency Use Authorization (EUA). This EUA will remain  in effect (meaning this test can be used) for the duration of the COVID-19 declaration under Section 564(b)(1) of the Act, 21 U.S.C.section 360bbb-3(b)(1), unless the authorization is terminated  or revoked sooner.       Influenza A by PCR NEGATIVE NEGATIVE Final   Influenza B by PCR NEGATIVE NEGATIVE Final    Comment: (NOTE) The Xpert Xpress SARS-CoV-2/FLU/RSV plus assay is intended as an aid in the diagnosis of influenza from Nasopharyngeal swab specimens and should not be used as a sole basis for treatment. Nasal washings and aspirates are unacceptable for Xpert Xpress SARS-CoV-2/FLU/RSV testing.  Fact Sheet for Patients: EntrepreneurPulse.com.au  Fact Sheet for Healthcare Providers: IncredibleEmployment.be  This test is not yet approved or cleared by the [place] FDA and has been authorized for detection and/or diagnosis of SARS-CoV-2 by FDA under an Emergency Use Authorization (EUA). This EUA will remain in effect (meaning this test can be used) for the duration of the COVID-19 declaration under Section 564(b)(1) of the Act, 21 U.S.C. section 360bbb-3(b)(1), unless the authorization is terminated or revoked.  Performed at [facility], [address] [place], [place] [address]    Studies/Results: DG HIP UNILAT WITH PELVIS 2-3 VIEWS RIGHT  Result Date: 09/09/2021 CLINICAL DATA:  Right hip pain for several weeks. EXAM: DG HIP (WITH OR WITHOUT PELVIS) 2-3V RIGHT COMPARISON:  CT abdomen pelvis dated January 02, 2021. FINDINGS: There is no evidence of hip  fracture or dislocation. There is no evidence of arthropathy or other focal bone abnormality. IMPRESSION: Negative. Electronically Signed   By: Titus Dubin M.D.   On: 09/09/2021 15:10   Medications: I have reviewed the patient's current medications. Scheduled Meds:  amiodarone  200 mg Oral BID   Chlorhexidine Gluconate Cloth  6 each Topical Daily   pantoprazole  40 mg Oral Daily   pravastatin  40 mg Oral q1800   tamsulosin  0.4 mg Oral Daily   vitamin B-12  1,000 mcg Oral Daily   Continuous Infusions: PRN Meds:.acetaminophen, albuterol, ondansetron **OR** ondansetron (ZOFRAN) IV   Assessment: Principal Problem:   Acute blood loss anemia Active Problems:   Diabetes mellitus with complication (HCC)   Essential hypertension   Generalized weakness   Atrial flutter (HCC)   CKD stage 4 due to type 2 diabetes mellitus (HCC)   Hematuria, gross   Acute lower UTI   Acute blood loss anemia (ABLA)   Acute cystitis with hematuria    Plan: GI  was consult for possible GI bleed.  The patient has no sign of any GI bleeding and his hemoglobin has stabilized since he stopped having red urine.  The patient has a normal MCV indicating that the patient is not likely to have microcytic anemia from a chronic GI bleed.  It is more likely that he had an acute bleed from his urinary system and no further GI workup is indicated at this time.  I will sign off.  Please call if any further GI concerns or questions.  We would like to thank you for the opportunity to participate in the care of Michael Simon.    LOS: 8 days   Lewayne Bunting 09/09/2021, 6:50 PM Pager [phone] 7am-5pm  Check AMION for 5pm -7am coverage and on weekends

## 2021-09-10 LAB — CBC
HCT: 27.4 % — ABNORMAL LOW (ref 39.0–52.0)
Hemoglobin: 9.2 g/dL — ABNORMAL LOW (ref 13.0–17.0)
MCH: 33.1 pg (ref 26.0–34.0)
MCHC: 33.6 g/dL (ref 30.0–36.0)
MCV: 98.6 fL (ref 80.0–100.0)
Platelets: 160 10*3/uL (ref 150–400)
RBC: 2.78 MIL/uL — ABNORMAL LOW (ref 4.22–5.81)
RDW: 16.3 % — ABNORMAL HIGH (ref 11.5–15.5)
WBC: 9.2 10*3/uL (ref 4.0–10.5)
nRBC: 0 % (ref 0.0–0.2)

## 2021-09-10 LAB — BASIC METABOLIC PANEL
Anion gap: 7 (ref 5–15)
BUN: 41 mg/dL — ABNORMAL HIGH (ref 8–23)
CO2: 29 mmol/L (ref 22–32)
Calcium: 8.4 mg/dL — ABNORMAL LOW (ref 8.9–10.3)
Chloride: 98 mmol/L (ref 98–111)
Creatinine, Ser: 1.93 mg/dL — ABNORMAL HIGH (ref 0.61–1.24)
GFR, Estimated: 33 mL/min — ABNORMAL LOW (ref >60)
Glucose, Bld: 160 mg/dL — ABNORMAL HIGH (ref 70–99)
Potassium: 5.1 mmol/L (ref 3.5–5.1)
Sodium: 134 mmol/L — ABNORMAL LOW (ref 135–145)

## 2021-09-10 LAB — MAGNESIUM: Magnesium: 1.9 mg/dL (ref 1.7–2.4)

## 2021-09-10 LAB — GLUCOSE, CAPILLARY: Glucose-Capillary: 151 mg/dL — ABNORMAL HIGH (ref 70–99)

## 2021-09-10 LAB — PHOSPHORUS: Phosphorus: 2.5 mg/dL (ref 2.5–4.6)

## 2021-09-10 MED ORDER — LISINOPRIL 30 MG PO TABS: ORAL_TABLET | ORAL | 3 refills | Status: DC

## 2021-09-10 MED ORDER — APIXABAN 2.5 MG PO TABS: ORAL_TABLET | ORAL | 2 refills | Status: DC

## 2021-09-10 MED ORDER — FUROSEMIDE 20 MG PO TABS: ORAL_TABLET | ORAL | 1 refills | Status: DC

## 2021-09-10 NOTE — Plan of Care (Signed)

## 2021-09-10 NOTE — Progress Notes (Signed)
Stat chest x-ray ordered for patient due to audible noises to left thoracic cavity. Hospitalist Hassan Rowan, NP notified with results. No new orders placed, patient stable. No additional audible noises noted from left chest. Will continue to monitor.

## 2021-09-10 NOTE — Discharge Summary (Signed)
Physician Discharge Summary   Michael Simon  male DOB: May 29, 1933  JHE:[id number]  PCP: [name], MD  Admit date: 09/01/2021 Discharge date: 09/10/2021  Admitted From: home Disposition:  home Family updated at bedside prior to discharge.  Home Health: Yes CODE STATUS: Full code  Discharge Instructions     Discharge instructions   Complete by: As directed    Please hold your Eliquis until followup with your outpatient doctor due to your bloody urine.  Please hold your blood pressure medications since your blood pressure has been low normal without any BP medications.  You also had not been receiving insulin during hospitalization.  Please go back to your home regimen of taking insulin as needed but avoid dropping blood sugar too low.  Please follow up with urology outpatient, appointment already scheduled.   Dr. Enzo Bi - -   Face-to-face encounter (required for Medicare/Medicaid patients)   Complete by: As directed    I Desma Maxim certify that this patient is under my care and that I, or a nurse practitioner or physician's assistant working with me, had a face-to-face encounter that meets the physician face-to-face encounter requirements with this patient on 09/05/2021. The encounter with the patient was in whole, or in part for the following medical condition(s) which is the primary reason for home health care (List medical condition): dementia, hematuria, foley catheter   The encounter with the patient was in whole, or in part, for the following medical condition, which is the primary reason for home health care: dementia, hematuria   I certify that, based on my findings, the following services are medically necessary home health services: Physical therapy   Reason for Medically Necessary Home Health Services: Therapy- Personnel officer, Public librarian   My clinical findings support the need for the above services: Unable to leave home safely  without assistance and/or assistive device   Further, I certify that my clinical findings support that this patient is homebound due to: Unable to leave home safely without assistance   Home Health   Complete by: As directed    To provide the following care/treatments:  OT PT     No wound care   Complete by: As directed         Hospital Course:  For full details, please see H&P, progress notes, consult notes and ancillary notes.  Briefly,  Michael Simon is a 87 y.o. male with PMH significant for chronic urinary retention status post insertion of Foley catheter on 08/31/21, diabetes mellitus, hypertension, chronic A. fib on anticoagulation presented from home to the ED on 09/01/2021 with AMS x1 days.   In the ED, it was found that he had acute anemia thought to be partially related to blood loss from traumatic urinary foley placement.  He was given 3 units of PRBC.  Urology was consulted.  Patient was started on CBI and hematuria had resolved. GI was consulted which recommended colonoscopy and endoscopy but patient wants to follow up outpatient GI.  Gross Hematuria, POA, Resolved. --thought to be partially related to bleeding from traumatic urinary foley placement while on Eliquis.  Eliquis was held on presentation. --Patient underwent cystoscopy with clot evacuation on 9/20 but patient continued to have gross hematuria.  He was started on CBI, with resolution of hematuria.   --pt will follow up with urology as outpatient.  Acute urinary retention secondary to BPH: --Pt was discharged with Foley.  Outpatient voiding trial scheduled for 9/27. --Continue  Flomax.  Normochromic normocytic anemia Acute blood loss anemia S/p 3 PRBCs, hemoglobin stable around 9's prior to discharge. --GI was consulted, however pt didn't want any GI procedure currently, and since Hgb stable after hematuria resolved, there was no need for inpatient EGD and colonoscopy.   Hypotension: --BP had been low  normal during hospitalization without any BP medication.  Home lasix and Lisinopril continued to be held after discharge, pending outpatient followup.   AKI on CKD stage IIIa: --Cr peaked at 2.85 on 9/15.  Could be secondary to obstruction.  Cr 1.93 prior to discharge.   History of CAD, s/p CABG Continue pravastatin  COPD Continue Spiriva, albuterol as needed  History of A. fib :  Continue amiodarone.   --Eliquis held pending outpatient followup due to hematuria.  DM2 --A1c 7.6, which is at goal for his age.  Pt was not ordered long-acting insulin during this hospitalization.  BG mostly within inpatient goal.     Discharge Diagnoses:  Principal Problem:   Acute blood loss anemia Active Problems:   Diabetes mellitus with complication (HCC)   Essential hypertension   Generalized weakness   Atrial flutter (HCC)   CKD stage 4 due to type 2 diabetes mellitus (HCC)   Hematuria, gross   Acute lower UTI   Acute blood loss anemia (ABLA)   Acute cystitis with hematuria   30 Day Unplanned Readmission Risk Score    Flowsheet Row ED to Hosp-Admission (Current) from 09/01/2021 in [place] (1A)  30 Day Unplanned Readmission Risk Score (%) 26.21 Filed at 09/10/2021 0800       This score is the patient's risk of an unplanned readmission within 30 days of being discharged (0 -100%). The score is based on dignosis, age, lab data, medications, orders, and past utilization.   Low:  0-14.9   Medium: 15-21.9   High: 22-29.9   Extreme: 30 and above         Discharge Instructions:  Allergies as of 09/10/2021   No Known Allergies      Medication List     STOP taking these medications    albuterol (2.5 MG/3ML) 0.083% nebulizer solution Commonly known as: PROVENTIL   albuterol 108 (90 Base) MCG/ACT inhaler Commonly known as: VENTOLIN HFA   budesonide-formoterol 80-4.5 MCG/ACT inhaler Commonly known as: Symbicort   fluticasone 50 MCG/ACT  nasal spray Commonly known as: FLONASE   levofloxacin 500 MG tablet Commonly known as: LEVAQUIN   loratadine 10 MG tablet Commonly known as: CLARITIN   mupirocin ointment 2 % Commonly known as: BACTROBAN   Pen Needles 33G X 4 MM Misc   Spiriva HandiHaler 18 MCG inhalation capsule Generic drug: tiotropium   Tresiba FlexTouch 100 UNIT/ML FlexTouch Pen Generic drug: insulin degludec       TAKE these medications    amiodarone 200 MG tablet Commonly known as: PACERONE Take 1 tablet (200 mg total) by mouth 2 (two) times daily.   apixaban 2.5 MG Tabs tablet Commonly known as: ELIQUIS Hold due to bloody urine. What changed:  how much to take how to take this when to take this additional instructions   Cyanocobalamin 1000 MCG Tbcr Take 1,000 mcg by mouth daily.   furosemide 20 MG tablet Commonly known as: LASIX Hold until followup with outpatient doctor since your blood pressure has been low normal without any blood pressure medication. What changed:  how much to take how to take this when to take this additional instructions   lisinopril  30 MG tablet Commonly known as: ZESTRIL Hold until followup with outpatient doctor since your blood pressure has been low normal without any blood pressure medication. What changed:  how much to take how to take this when to take this additional instructions   lovastatin 40 MG tablet Commonly known as: MEVACOR TAKE 1 TABLET BY MOUTH EVERYDAY AT BEDTIME What changed: See the new instructions.   pantoprazole 40 MG tablet Commonly known as: PROTONIX TAKE 1 TABLET BY MOUTH EVERY DAY   tamsulosin 0.4 MG Caps capsule Commonly known as: FLOMAX Take 1 capsule (0.4 mg total) by mouth daily.         Follow-up Information     [name], MD Follow up in 1 week(s).   Specialties: Internal Medicine, Cardiology Contact information: [place] [address] [phone]                 No Known  Allergies   The results of significant diagnostics from this hospitalization (including imaging, microbiology, ancillary and laboratory) are listed below for reference.   Consultations:   Procedures/Studies: DG Chest 2 View  Result Date: 09/09/2021 CLINICAL DATA:  Ribcage dysfunction.  Ribs popping. EXAM: CHEST - 2 VIEW COMPARISON:  Chest radiograph 05/05/2021 FINDINGS: Post median sternotomy. Many of the sternal wires are broken, unchanged. Stable cardiomegaly. Unchanged mediastinal contours. Aortic atherosclerosis. Mild interstitial coarsening appears similar to prior exam. No frank pulmonary edema. Focal airspace disease, pleural effusion, or pneumothorax. No gross rib fracture or focal rib abnormality. IMPRESSION: 1. Stable cardiomegaly without frank pulmonary edema. 2. Stable mild interstitial coarsening likely chronic. 3. Unremarkable appearance of the ribs on this two view chest exam. Electronically Signed   By: Keith Rake M.D.   On: 09/09/2021 20:18   US PELVIS LIMITED (TRANSABDOMINAL ONLY)  Result Date: 09/03/2021 CLINICAL DATA:  Hematuria after traumatic Foley placement. EXAM: LIMITED ULTRASOUND OF PELVIS TECHNIQUE: Limited transabdominal ultrasound examination of the pelvis was performed. COMPARISON:  None. FINDINGS: Echogenic debris is seen within the thick-walled bladder. The Foley catheter is noted in the bladder. IMPRESSION: Echogenic debris in the thick-walled bladder may be due to blood products given the history. Electronically Signed   By: Dorise Bullion III M.D.   On: 09/03/2021 16:43   DG Chest Portable 1 View  Result Date: 09/01/2021 CLINICAL DATA:  Weakness EXAM: PORTABLE CHEST 1 VIEW COMPARISON:  01/05/2021 FINDINGS: Prior CABG. Cardiomegaly, vascular congestion. No confluent opacities or effusions. No acute bony abnormality. IMPRESSION: Cardiomegaly, vascular congestion. Electronically Signed   By: Rolm Baptise M.D.   On: 09/01/2021 10:17   ECHOCARDIOGRAM  COMPLETE  Result Date: 09/01/2021    ECHOCARDIOGRAM REPORT   Patient Name:   Michael Simon Date of Exam: 09/01/2021 Medical Rec #:  [id number]            Height:       68.0 in Accession #:    [id number]           Weight:       169.8 lb Date of Birth:  [birth year]             BSA:          1.906 m Patient Age:    53 years             BP:           127/56 mmHg Patient Gender: M  HR:           82 bpm. Exam Location:  [facility] Procedure: 2D Echo, Cardiac Doppler and Color Doppler Indications:     CHF- acute diastolic O96.29  History:         Patient has no prior history of Echocardiogram examinations.                  CAD and Previous Myocardial Infarction, Prior CABG; Risk                  Factors:Hypertension and Diabetes.  Sonographer:     Sherrie Sport Referring Phys:  [place] Diagnosing Phys: Neoma Laming  Sonographer Comments: Suboptimal apical window. IMPRESSIONS  1. Left ventricular ejection fraction, by estimation, is 35 to 40%. The left ventricle has moderately decreased function. The left ventricle has no regional wall motion abnormalities. There is mild concentric left ventricular hypertrophy. Left ventricular diastolic parameters are indeterminate.  2. Right ventricular systolic function is normal. The right ventricular size is normal.  3. Left atrial size was mild to moderately dilated.  4. Right atrial size was mild to moderately dilated.  5. The pericardial effusion is circumferential.  6. The mitral valve is normal in structure. Mild mitral valve regurgitation. No evidence of mitral stenosis.  7. The aortic valve is normal in structure. Aortic valve regurgitation is not visualized. No aortic stenosis is present.  8. The inferior vena cava is normal in size with greater than 50% respiratory variability, suggesting right atrial pressure of 3 mmHg. FINDINGS  Left Ventricle: Left ventricular ejection fraction, by estimation, is 35 to 40%. The left ventricle has moderately  decreased function. The left ventricle has no regional wall motion abnormalities. The left ventricular internal cavity size was normal in size. There is mild concentric left ventricular hypertrophy. Left ventricular diastolic parameters are indeterminate. Right Ventricle: The right ventricular size is normal. No increase in right ventricular wall thickness. Right ventricular systolic function is normal. Left Atrium: Left atrial size was mild to moderately dilated. Right Atrium: Right atrial size was mild to moderately dilated. Pericardium: Trivial pericardial effusion is present. The pericardial effusion is circumferential. Mitral Valve: The mitral valve is normal in structure. Mild mitral valve regurgitation. No evidence of mitral valve stenosis. Tricuspid Valve: The tricuspid valve is normal in structure. Tricuspid valve regurgitation is mild . No evidence of tricuspid stenosis. Aortic Valve: The aortic valve is normal in structure. Aortic valve regurgitation is not visualized. No aortic stenosis is present. Aortic valve mean gradient measures 2.3 mmHg. Aortic valve peak gradient measures 4.2 mmHg. Aortic valve area, by VTI measures 1.33 cm. Pulmonic Valve: The pulmonic valve was normal in structure. Pulmonic valve regurgitation is not visualized. No evidence of pulmonic stenosis. Aorta: The aortic root is normal in size and structure. Venous: The inferior vena cava is normal in size with greater than 50% respiratory variability, suggesting right atrial pressure of 3 mmHg. IAS/Shunts: No atrial level shunt detected by color flow Doppler.  LEFT VENTRICLE PLAX 2D LVIDd:         4.07 cm LVIDs:         3.30 cm LV PW:         1.44 cm LV IVS:        1.03 cm LVOT diam:     1.90 cm LV SV:         27 LV SV Index:   14 LVOT Area:     2.84 cm  RIGHT VENTRICLE  RV Basal diam:  4.11 cm RV S prime:     6.96 cm/s TAPSE (M-mode): 3.7 cm LEFT ATRIUM             Index       RIGHT ATRIUM           Index LA diam:        4.30 cm 2.26  cm/m  RA Area:     14.50 cm LA Vol (A2C):   62.8 ml 32.95 ml/m RA Volume:   39.90 ml  20.93 ml/m LA Vol (A4C):   52.7 ml 27.65 ml/m LA Biplane Vol: 57.4 ml 30.11 ml/m  AORTIC VALVE                   PULMONIC VALVE AV Area (Vmax):    1.51 cm    PV Vmax:        0.92 m/s AV Area (Vmean):   1.60 cm    PV Peak grad:   3.4 mmHg AV Area (VTI):     1.33 cm    RVOT Peak grad: 5 mmHg AV Vmax:           102.10 cm/s AV Vmean:          67.800 cm/s AV VTI:            0.202 m AV Peak Grad:      4.2 mmHg AV Mean Grad:      2.3 mmHg LVOT Vmax:         54.20 cm/s LVOT Vmean:        38.300 cm/s LVOT VTI:          0.095 m LVOT/AV VTI ratio: 0.47  AORTA Ao Root diam: 3.30 cm MITRAL VALVE MV Area (PHT): 3.20 cm     SHUNTS MV Decel Time: 237 msec     Systemic VTI:  0.09 m MV E velocity: 100.00 cm/s  Systemic Diam: 1.90 cm Neoma Laming Electronically signed by Neoma Laming Signature Date/Time: 09/01/2021/7:29:28 PM    Final    DG HIP UNILAT WITH PELVIS 2-3 VIEWS RIGHT  Result Date: 09/09/2021 CLINICAL DATA:  Right hip pain for several weeks. EXAM: DG HIP (WITH OR WITHOUT PELVIS) 2-3V RIGHT COMPARISON:  CT abdomen pelvis dated January 02, 2021. FINDINGS: There is no evidence of hip fracture or dislocation. There is no evidence of arthropathy or other focal bone abnormality. IMPRESSION: Negative. Electronically Signed   By: Titus Dubin M.D.   On: 09/09/2021 15:10      Labs: BNP (last 3 results) Recent Labs    01/02/21 2236  BNP 353.6*   Basic Metabolic Panel: Recent Labs  Lab 09/04/21 0530 09/05/21 0426 09/06/21 0444 09/07/21 0430 09/10/21 0533  NA 136 135 137 136 134*  K 4.0 4.1 4.0 4.6 5.1  CL 101 103 106 104 98  CO2 27 27 25 26 29   GLUCOSE 125* 135* 108* 172* 160*  BUN 28* 28* 25* 31* 41*  CREATININE 1.66* 1.83* 1.72* 1.93* 1.93*  CALCIUM 8.0* 8.2* 7.7* 7.8* 8.4*  MG  --   --   --   --  1.9  PHOS  --   --   --   --  2.5   Liver Function Tests: No results for input(s): AST, ALT, ALKPHOS,  BILITOT, PROT, ALBUMIN in the last 168 hours. No results for input(s): LIPASE, AMYLASE in the last 168 hours. No results for input(s): AMMONIA in the last 168 hours. CBC: Recent Labs  Lab  09/07/21 0430 09/07/21 1421 09/08/21 0411 09/09/21 0508 09/10/21 0533  WBC 10.6* 12.3* 9.2 9.4 9.2  HGB 7.8* 9.3* 8.1* 9.0* 9.2*  HCT 23.0* 27.2* 24.5* 27.2* 27.4*  MCV 97.9 95.8 97.6 96.1 98.6  PLT 125* 155 138* 149* 160   Cardiac Enzymes: No results for input(s): CKTOTAL, CKMB, CKMBINDEX, TROPONINI in the last 168 hours. BNP: Invalid input(s): POCBNP CBG: Recent Labs  Lab 09/08/21 2203 09/09/21 0635 09/09/21 0809 09/09/21 1714 09/10/21 0611  GLUCAP 159* 140* 152* 187* 151*   D-Dimer No results for input(s): DDIMER in the last 72 hours. Hgb A1c No results for input(s): HGBA1C in the last 72 hours. Lipid Profile No results for input(s): CHOL, HDL, LDLCALC, TRIG, CHOLHDL, LDLDIRECT in the last 72 hours. Thyroid function studies No results for input(s): TSH, T4TOTAL, T3FREE, THYROIDAB in the last 72 hours.  Invalid input(s): FREET3 Anemia work up No results for input(s): VITAMINB12, FOLATE, FERRITIN, TIBC, IRON, RETICCTPCT in the last 72 hours. Urinalysis    Component Value Date/Time   COLORURINE RED (A) 09/01/2021 1004   APPEARANCEUR TURBID (A) 09/01/2021 1004   LABSPEC 1.020 09/01/2021 1004   PHURINE  09/01/2021 1004    TEST NOT REPORTED DUE TO COLOR INTERFERENCE OF URINE PIGMENT   GLUCOSEU (A) 09/01/2021 1004    TEST NOT REPORTED DUE TO COLOR INTERFERENCE OF URINE PIGMENT   HGBUR (A) 09/01/2021 1004    TEST NOT REPORTED DUE TO COLOR INTERFERENCE OF URINE PIGMENT   BILIRUBINUR (A) 09/01/2021 1004    TEST NOT REPORTED DUE TO COLOR INTERFERENCE OF URINE PIGMENT   BILIRUBINUR Negative 08/29/2021 1303   KETONESUR (A) 09/01/2021 1004    TEST NOT REPORTED DUE TO COLOR INTERFERENCE OF URINE PIGMENT   PROTEINUR (A) 09/01/2021 1004    TEST NOT REPORTED DUE TO COLOR INTERFERENCE OF  URINE PIGMENT   UROBILINOGEN 0.2 08/29/2021 1303   NITRITE (A) 09/01/2021 1004    TEST NOT REPORTED DUE TO COLOR INTERFERENCE OF URINE PIGMENT   LEUKOCYTESUR (A) 09/01/2021 1004    TEST NOT REPORTED DUE TO COLOR INTERFERENCE OF URINE PIGMENT   Sepsis Labs Invalid input(s): PROCALCITONIN,  WBC,  LACTICIDVEN Microbiology Recent Results (from the past 240 hour(s))  Urine Culture     Status: None   Collection Time: 09/01/21 10:04 AM   Specimen: Urine, Clean Catch  Result Value Ref Range Status   Specimen Description   Final    URINE, CLEAN CATCH Performed at [facility], [address] [place], [place] [address]    Special Requests   Final    NONE Performed at [facility], [address] [place], [place] [address]    Culture   Final    NO GROWTH Performed at [facility], [place] [address] [place], [place] [address]    Report Status 09/03/2021 FINAL  Final  Blood culture (routine x 2)     Status: None   Collection Time: 09/01/21 11:08 AM   Specimen: BLOOD  Result Value Ref Range Status   Specimen Description BLOOD  [facility]  Final   Special Requests   Final    BOTTLES DRAWN AEROBIC AND ANAEROBIC Blood Culture results may not be optimal due to an inadequate volume of blood received in culture bottles   Culture   Final    NO GROWTH 5 DAYS Performed at [facility], [address], [place], [place] [address]    Report Status 09/06/2021 FINAL  Final  Blood culture (routine x  2)     Status: None   Collection Time: 09/01/21 11:08 AM   Specimen: BLOOD  Result Value Ref Range Status   Specimen Description BLOOD RIGHT ANTECUBITAL  Final   Special Requests   Final    BOTTLES DRAWN AEROBIC AND ANAEROBIC Blood Culture adequate volume   Culture   Final    NO GROWTH 5 DAYS Performed at [facility], [address] [place], [place] [address]    Report Status 09/06/2021 FINAL  Final  Resp Panel by RT-PCR (Flu A&B, Covid) Nasopharyngeal Swab      Status: None   Collection Time: 09/01/21 12:53 PM   Specimen: Nasopharyngeal Swab; Nasopharyngeal(NP) swabs in vial transport medium  Result Value Ref Range Status   SARS Coronavirus 2 by RT PCR NEGATIVE NEGATIVE Final    Comment: (NOTE) SARS-CoV-2 target nucleic acids are NOT DETECTED.  The SARS-CoV-2 RNA is generally detectable in upper respiratory specimens during the acute phase of infection. The lowest concentration of SARS-CoV-2 viral copies this assay can detect is 138 copies/mL. A negative result does not preclude SARS-Cov-2 infection and should not be used as the sole basis for treatment or other patient management decisions. A negative result may occur with  improper specimen collection/handling, submission of specimen other than nasopharyngeal swab, presence of viral mutation(s) within the areas targeted by this assay, and inadequate number of viral copies(<138 copies/mL). A negative result must be combined with clinical observations, patient history, and epidemiological information. The expected result is Negative.  Fact Sheet for Patients:  EntrepreneurPulse.com.au  Fact Sheet for Healthcare Providers:  IncredibleEmployment.be  This test is no t yet approved or cleared by the [place] FDA and  has been authorized for detection and/or diagnosis of SARS-CoV-2 by FDA under an Emergency Use Authorization (EUA). This EUA will remain  in effect (meaning this test can be used) for the duration of the COVID-19 declaration under Section 564(b)(1) of the Act, 21 U.S.C.section 360bbb-3(b)(1), unless the authorization is terminated  or revoked sooner.       Influenza A by PCR NEGATIVE NEGATIVE Final   Influenza B by PCR NEGATIVE NEGATIVE Final    Comment: (NOTE) The Xpert Xpress SARS-CoV-2/FLU/RSV plus assay is intended as an aid in the diagnosis of influenza from Nasopharyngeal swab specimens and should not be used as a sole basis  for treatment. Nasal washings and aspirates are unacceptable for Xpert Xpress SARS-CoV-2/FLU/RSV testing.  Fact Sheet for Patients: EntrepreneurPulse.com.au  Fact Sheet for Healthcare Providers: IncredibleEmployment.be  This test is not yet approved or cleared by the [place] FDA and has been authorized for detection and/or diagnosis of SARS-CoV-2 by FDA under an Emergency Use Authorization (EUA). This EUA will remain in effect (meaning this test can be used) for the duration of the COVID-19 declaration under Section 564(b)(1) of the Act, 21 U.S.C. section 360bbb-3(b)(1), unless the authorization is terminated or revoked.  Performed at [facility], [address] [place], [place] [address]      Total time spend on discharging this patient, including the last patient exam, discussing the hospital stay, instructions for ongoing care as it relates to all pertinent caregivers, as well as preparing the medical discharge records, prescriptions, and/or referrals as applicable, is 45 minutes.    Enzo Bi, MD  [facility] 09/10/2021, 11:57 AM

## 2021-09-12 ENCOUNTER — Ambulatory Visit (INDEPENDENT_AMBULATORY_CARE_PROVIDER_SITE_OTHER): Payer: Medicare HMO | Admitting: Internal Medicine

## 2021-09-12 ENCOUNTER — Other Ambulatory Visit: Payer: Self-pay

## 2021-09-12 DIAGNOSIS — R3915 Urgency of urination: Secondary | ICD-10-CM | POA: Diagnosis not present

## 2021-09-12 DIAGNOSIS — K921 Melena: Secondary | ICD-10-CM

## 2021-09-12 DIAGNOSIS — N4282 Prostatosis syndrome: Secondary | ICD-10-CM

## 2021-09-12 DIAGNOSIS — R310 Gross hematuria: Secondary | ICD-10-CM

## 2021-09-12 LAB — CBC WITH DIFFERENTIAL/PLATELET
Absolute Monocytes: 809 cells/uL (ref 200–950)
Basophils Absolute: 57 cells/uL (ref 0–200)
Basophils Relative: 0.5 %
Eosinophils Absolute: 285 cells/uL (ref 15–500)
Eosinophils Relative: 2.5 %
HCT: 28.5 % — ABNORMAL LOW (ref 38.5–50.0)
Hemoglobin: 9.3 g/dL — ABNORMAL LOW (ref 13.2–17.1)
Lymphs Abs: 1345 cells/uL (ref 850–3900)
MCH: 31.4 pg (ref 27.0–33.0)
MCHC: 32.6 g/dL (ref 32.0–36.0)
MCV: 96.3 fL (ref 80.0–100.0)
MPV: 11.8 fL (ref 7.5–12.5)
Monocytes Relative: 7.1 %
Neutro Abs: 8903 cells/uL — ABNORMAL HIGH (ref 1500–7800)
Neutrophils Relative %: 78.1 %
Platelets: 154 10*3/uL (ref 140–400)
RBC: 2.96 10*6/uL — ABNORMAL LOW (ref 4.20–5.80)
RDW: 14.7 % (ref 11.0–15.0)
Total Lymphocyte: 11.8 %
WBC: 11.4 10*3/uL — ABNORMAL HIGH (ref 3.8–10.8)

## 2021-09-13 ENCOUNTER — Ambulatory Visit: Payer: Medicare HMO | Admitting: Physician Assistant

## 2021-09-13 DIAGNOSIS — R310 Gross hematuria: Secondary | ICD-10-CM | POA: Diagnosis not present

## 2021-09-13 DIAGNOSIS — R339 Retention of urine, unspecified: Secondary | ICD-10-CM | POA: Diagnosis not present

## 2021-09-13 LAB — BLADDER SCAN AMB NON-IMAGING

## 2021-09-13 MED ORDER — FINASTERIDE 5 MG PO TABS: 5.0000 mg | ORAL_TABLET | Freq: Every day | ORAL | 11 refills | Status: DC

## 2021-09-13 NOTE — Progress Notes (Signed)
09/13/2021 2:19 PM   Michael Simon [birth year] [id number]  CC: Chief Complaint  Patient presents with   Urinary Retention   HPI: Michael Simon is a 87 y.o. male with PMH BPH with urinary retention recently hospitalized with gross hematuria requiring CBI and 3 units PRBCs who presents today for voiding trial.  He is accompanied today by his son, who contributes to HPI.  Today he reports he has had some mild intermittent gross hematuria since his discharge.  Today his Foley catheter is draining clear, pink urine without clots.  His son reports his urine was more red yesterday but has improved on its own.  Foley catheter removed in the morning, see procedure note below for further details.  He return to clinic in the afternoon.  He reports drinking approximately 40 ounces of fluid.  He has been able to void once, but he had significant straining with this. PVR >578mL.  PMH: Past Medical History:  Diagnosis Date   Basal cell carcinoma 04/14/2021   left medial clavicle EDC 05/31/2021   Basal cell carcinoma 04/14/2021   nose tip    Basal cell carcinoma (BCC) of skin of nose 02/15/2021   CAD (coronary artery disease)    Diverticulitis 2022   HLD (hyperlipidemia)    HTN (hypertension)    LBBB (left bundle branch block)    Myocardial infarction ([place])    Osteoarthritis    Type II diabetes mellitus ([place])     Surgical History: Past Surgical History:  Procedure Laterality Date   CARDIAC CATHETERIZATION     CATARACT EXTRACTION     COLONOSCOPY     COLONOSCOPY WITH PROPOFOL N/A 08/26/2019   Procedure: COLONOSCOPY WITH PROPOFOL;  Surgeon: [name], MD;  Location[facility] ENDOSCOPY;  Service: Gastroenterology;  Laterality: N/A;   COLONOSCOPY WITH PROPOFOL N/A 09/22/2019   Procedure: COLONOSCOPY WITH PROPOFOL;  Surgeon: [name], MD;  Location: [facility] ENDOSCOPY;  Service: Gastroenterology;  Laterality: N/A;  pediatric colonoscope needed   CORONARY ARTERY BYPASS GRAFT  2012    CYSTOSCOPY WITH FULGERATION N/A 09/06/2021   Procedure: CYSTOSCOPY WITH FULGERATION-Possible Bladder Biospy;  Surgeon: Abbie Sons, MD;  Location: [facility] ORS;  Service: Urology;  Laterality: N/A;   EYE SURGERY     INGUINAL HERNIA REPAIR     JOINT REPLACEMENT     knees   TRANSURETHRAL RESECTION OF BLADDER TUMOR N/A 09/06/2021   Procedure: TRANSURETHRAL RESECTION OF BLADDER TUMOR (TURBT)-Possible;  Surgeon: Abbie Sons, MD;  Location: [facility] ORS;  Service: Urology;  Laterality: N/A;    Home Medications:  Allergies as of 09/13/2021   No Known Allergies      Medication List        Accurate as of September 13, 2021  2:19 PM. If you have any questions, ask your nurse or doctor.          amiodarone 200 MG tablet Commonly known as: PACERONE Take 1 tablet (200 mg total) by mouth 2 (two) times daily.   apixaban 2.5 MG Tabs tablet Commonly known as: ELIQUIS Hold due to bloody urine.   Cyanocobalamin 1000 MCG Tbcr Take 1,000 mcg by mouth daily.   finasteride 5 MG tablet Commonly known as: PROSCAR Take 1 tablet (5 mg total) by mouth daily.   furosemide 20 MG tablet Commonly known as: LASIX Hold until followup with outpatient doctor since your blood pressure has been low normal without any blood pressure medication.   lisinopril 30 MG tablet Commonly known as: ZESTRIL Hold until  followup with outpatient doctor since your blood pressure has been low normal without any blood pressure medication.   lovastatin 40 MG tablet Commonly known as: MEVACOR TAKE 1 TABLET BY MOUTH EVERYDAY AT BEDTIME What changed: See the new instructions.   pantoprazole 40 MG tablet Commonly known as: PROTONIX TAKE 1 TABLET BY MOUTH EVERY DAY   tamsulosin 0.4 MG Caps capsule Commonly known as: FLOMAX Take 1 capsule (0.4 mg total) by mouth daily.        Allergies:  No Known Allergies  Family History: Family History  Problem Relation Age of Onset   Arthritis Mother    Heart disease  Mother    Heart disease Other     Social History:   reports that he quit smoking about 13 years ago. His smoking use included cigarettes. He has never used smokeless tobacco. He reports that he does not drink alcohol and does not use drugs.  Physical Exam: There were no vitals taken for this visit.  Constitutional:  Alert and oriented, no acute distress, nontoxic appearing HEENT: [place], AT Cardiovascular: No clubbing, cyanosis, or edema Respiratory: Normal respiratory effort, no increased work of breathing Skin: No rashes, bruises or suspicious lesions Neurologic: Grossly intact, no focal deficits, moving all 4 extremities Psychiatric: Normal mood and affect  Laboratory Data: Results for orders placed or performed in visit on 09/13/21  Bladder Scan (Post Void Residual) in office  Result Value Ref Range   Scan Result >547mL    Catheter Removal  Patient is present today for a catheter removal.  27ml of water was drained from the balloon. A 20FR three-way foley cath was removed from the bladder no complications were noted . Patient tolerated well.  Performed by: Debroah Loop, PA-C   Simple Catheter Placement  Due to urinary retention patient is present today for a foley cath placement.  Patient was cleaned and prepped in a sterile fashion with betadine and 2% lidocaine jelly was instilled into the urethra. A 18 FR coude foley catheter was inserted, urine return was noted  544ml, urine was pink tinged in color.  The balloon was filled with 10cc of sterile water.  A leg bag was attached for drainage. Patient was also given a night bag to take home and was given instruction on how to change from one bag to another.  Patient tolerated well, no complications were noted   Performed by: Debroah Loop, PA-C   Assessment & Plan:   1. Gross hematuria Ongoing, intermittent gross hematuria.  No evidence of significant blood loss on exam today.  We will start finasteride to reduce  his risk of bleeding long-term.  He expressed understanding. - finasteride (PROSCAR) 5 MG tablet; Take 1 tablet (5 mg total) by mouth daily.  Dispense: 30 tablet; Refill: 11  2. Urinary retention Voiding trial failed today.  I offered him catheter replacement with repeat voiding trial in 2 weeks and he excepted.  Foley catheter replaced, see procedure note above.  He had some pink-tinged urinary output with placement, but no clots.  We discussed warning signs of gross hematuria including passage of thick, ketchup-like urine, dark red urine, clot passage, or nondraining Foley.  If he fails a second voiding trial in 2 weeks, to discuss possible follow-up with Dr. Diamantina Providence. - Bladder Scan (Post Void Residual) in office  Return in about 2 weeks (around 09/27/2021) for Voiding trial with me and Dr. Diamantina Providence.  Debroah Loop, PA-C  [facility] [address] [place],  [place] [address] [phone]

## 2021-09-15 ENCOUNTER — Encounter: Payer: Self-pay | Admitting: Internal Medicine

## 2021-09-15 NOTE — Assessment & Plan Note (Signed)
Patient will refer to urologist

## 2021-09-15 NOTE — Assessment & Plan Note (Signed)
Michael Simon is a 87 y.o.malewith PMH significant for chronic urinary retention status post insertion of Foley catheteron 08/31/21, diabetes mellitus, hypertension,  chronic A. fib on anticoagulation presented from home to the ED on9/15/2022   In the ED, it was found thathehad acute anemia thought to be partially related to blood loss from traumatic urinary foley placement. He was given 3 units of PRBC.

## 2021-09-15 NOTE — Assessment & Plan Note (Signed)
Referred to GU 

## 2021-09-15 NOTE — Progress Notes (Signed)
Established Patient Office Visit  Subjective:  Patient ID: Michael Simon, male    DOB: [birth year]  Age: 87 y.o. MRN: [id number]  CC: No chief complaint on file.   HPI  DEMBA NIGH presents for check up Past Medical History:  Diagnosis Date   Basal cell carcinoma 04/14/2021   left medial clavicle EDC 05/31/2021   Basal cell carcinoma 04/14/2021   nose tip    Basal cell carcinoma (BCC) of skin of nose 02/15/2021   CAD (coronary artery disease)    Diverticulitis 2022   HLD (hyperlipidemia)    HTN (hypertension)    LBBB (left bundle branch block)    Myocardial infarction ([place])    Osteoarthritis    Type II diabetes mellitus ([place])     Past Surgical History:  Procedure Laterality Date   CARDIAC CATHETERIZATION     CATARACT EXTRACTION     COLONOSCOPY     COLONOSCOPY WITH PROPOFOL N/A 08/26/2019   Procedure: COLONOSCOPY WITH PROPOFOL;  Surgeon: [name], MD;  Location: [facility] ENDOSCOPY;  Service: Gastroenterology;  Laterality: N/A;   COLONOSCOPY WITH PROPOFOL N/A 09/22/2019   Procedure: COLONOSCOPY WITH PROPOFOL;  Surgeon: [name], MD;  Location: [facility] ENDOSCOPY;  Service: Gastroenterology;  Laterality: N/A;  pediatric colonoscope needed   CORONARY ARTERY BYPASS GRAFT  2012   CYSTOSCOPY WITH FULGERATION N/A 09/06/2021   Procedure: CYSTOSCOPY WITH FULGERATION-Possible Bladder Biospy;  Surgeon: Abbie Sons, MD;  Location: [facility] ORS;  Service: Urology;  Laterality: N/A;   EYE SURGERY     INGUINAL HERNIA REPAIR     JOINT REPLACEMENT     knees   TRANSURETHRAL RESECTION OF BLADDER TUMOR N/A 09/06/2021   Procedure: TRANSURETHRAL RESECTION OF BLADDER TUMOR (TURBT)-Possible;  Surgeon: Abbie Sons, MD;  Location: [facility] ORS;  Service: Urology;  Laterality: N/A;    Family History  Problem Relation Age of Onset   Arthritis Mother    Heart disease Mother    Heart disease Other     Social History   Socioeconomic History   Marital status: Widowed    Spouse  name: Not on file   Number of children: Not on file   Years of education: Not on file   Highest education level: Not on file  Occupational History   Not on file  Tobacco Use   Smoking status: Former    Types: Cigarettes    Quit date: 03/03/2008    Years since quitting: 13.5   Smokeless tobacco: Never  Vaping Use   Vaping Use: Never used  Substance and Sexual Activity   Alcohol use: No    Alcohol/week: 0.0 standard drinks   Drug use: No   Sexual activity: Not on file  Other Topics Concern   Not on file  Social History Narrative   Not on file   Social Determinants of Health   Financial Resource Strain: Not on file  Food Insecurity: Not on file  Transportation Needs: Not on file  Physical Activity: Not on file  Stress: Not on file  Social Connections: Not on file  Intimate Partner Violence: Not on file     Current Outpatient Medications:    amiodarone (PACERONE) 200 MG tablet, Take 1 tablet (200 mg total) by mouth 2 (two) times daily., Disp: 60 tablet, Rfl: 1   apixaban (ELIQUIS) 2.5 MG TABS tablet, Hold due to bloody urine., Disp: 60 tablet, Rfl: 2   Cyanocobalamin 1000 MCG TBCR, Take 1,000 mcg by mouth daily. , Disp: , Rfl:  finasteride (PROSCAR) 5 MG tablet, Take 1 tablet (5 mg total) by mouth daily., Disp: 30 tablet, Rfl: 11   furosemide (LASIX) 20 MG tablet, Hold until followup with outpatient doctor since your blood pressure has been low normal without any blood pressure medication., Disp: 90 tablet, Rfl: 1   lisinopril (ZESTRIL) 30 MG tablet, Hold until followup with outpatient doctor since your blood pressure has been low normal without any blood pressure medication., Disp: 90 tablet, Rfl: 3   lovastatin (MEVACOR) 40 MG tablet, TAKE 1 TABLET BY MOUTH EVERYDAY AT BEDTIME (Patient taking differently: Take 40 mg by mouth at bedtime.), Disp: 90 tablet, Rfl: 2   pantoprazole (PROTONIX) 40 MG tablet, TAKE 1 TABLET BY MOUTH EVERY DAY, Disp: 90 tablet, Rfl: 3   tamsulosin  (FLOMAX) 0.4 MG CAPS capsule, Take 1 capsule (0.4 mg total) by mouth daily., Disp: 30 capsule, Rfl: 3   No Known Allergies  ROS Review of Systems  Constitutional: Negative.   HENT: Negative.    Eyes: Negative.   Respiratory: Negative.    Cardiovascular: Negative.   Gastrointestinal: Negative.   Endocrine: Negative.   Genitourinary: Negative.   Musculoskeletal: Negative.   Skin: Negative.   Allergic/Immunologic: Negative.   Neurological: Negative.   Hematological: Negative.   Psychiatric/Behavioral: Negative.    All other systems reviewed and are negative.    Objective:    Physical Exam Vitals reviewed.  Constitutional:      Appearance: Normal appearance.  HENT:     Mouth/Throat:     Mouth: Mucous membranes are moist.  Eyes:     Pupils: Pupils are equal, round, and reactive to light.  Neck:     Vascular: No carotid bruit.  Cardiovascular:     Rate and Rhythm: Normal rate and regular rhythm.     Pulses: Normal pulses.     Heart sounds: Normal heart sounds.  Pulmonary:     Effort: Pulmonary effort is normal.     Breath sounds: Normal breath sounds.  Abdominal:     General: Bowel sounds are normal.     Palpations: Abdomen is soft. There is no hepatomegaly, splenomegaly or mass.     Tenderness: There is no abdominal tenderness.     Hernia: No hernia is present.  Musculoskeletal:     Cervical back: Neck supple.     Right lower leg: No edema.     Left lower leg: No edema.  Skin:    Findings: No rash.  Neurological:     Mental Status: He is alert and oriented to person, place, and time.     Motor: No weakness.  Psychiatric:        Mood and Affect: Mood normal.        Behavior: Behavior normal.    There were no vitals taken for this visit. Wt Readings from Last 3 Encounters:  09/06/21 172 lb (78 kg)  08/31/21 170 lb (77.1 kg)  08/31/21 171 lb 1.6 oz (77.6 kg)     Health Maintenance Due  Topic Date Due   FOOT EXAM  Never done   OPHTHALMOLOGY EXAM  Never  done   TETANUS/TDAP  Never done   Zoster Vaccines- Shingrix (1 of 2) Never done   COVID-19 Vaccine (3 - Pfizer risk series) 04/08/2020    There are no preventive care reminders to display for this patient.  Lab Results  Component Value Date   TSH 3.698 01/02/2021   Lab Results  Component Value Date   WBC 11.4 (H)  09/12/2021   HGB 9.3 (L) 09/12/2021   HCT 28.5 (L) 09/12/2021   MCV 96.3 09/12/2021   PLT 154 09/12/2021   Lab Results  Component Value Date   NA 134 (L) 09/10/2021   K 5.1 09/10/2021   CO2 29 09/10/2021   GLUCOSE 160 (H) 09/10/2021   BUN 41 (H) 09/10/2021   CREATININE 1.93 (H) 09/10/2021   BILITOT 0.7 09/01/2021   ALKPHOS 111 09/01/2021   AST 25 09/01/2021   ALT 13 09/01/2021   PROT 6.2 (L) 09/01/2021   ALBUMIN 3.7 09/01/2021   CALCIUM 8.4 (L) 09/10/2021   ANIONGAP 7 09/10/2021   EGFR 23 (L) 08/31/2021   No results found for: CHOL No results found for: HDL No results found for: LDLCALC No results found for: TRIG No results found for: CHOLHDL Lab Results  Component Value Date   HGBA1C 7.6 (H) 09/01/2021      Assessment & Plan:   Problem List Items Addressed This Visit       Genitourinary   Prostatitis syndrome    Patient will refer to urologist      Hematuria, gross    LENORD FRALIX is a 87 y.o. male with PMH significant for chronic urinary retention status post insertion of Foley catheter on 08/31/21, diabetes mellitus, hypertension,  chronic A. fib on anticoagulation presented from home to the ED on 09/01/2021     In the ED, it was found that he had acute anemia thought to be partially related to blood loss from traumatic urinary foley placement.  He was given 3 units of PRBC.          Other   Hematochezia - Primary    We will check CBC      Relevant Orders   CBC with Differential/Platelet (Completed)   Urgency of urination    Referred to GU       No orders of the defined types were placed in this  encounter.   Follow-up: No follow-ups on file.    [name], MD

## 2021-09-15 NOTE — Assessment & Plan Note (Signed)
We will check CBC

## 2021-09-29 ENCOUNTER — Other Ambulatory Visit: Payer: Self-pay

## 2021-09-29 ENCOUNTER — Ambulatory Visit (INDEPENDENT_AMBULATORY_CARE_PROVIDER_SITE_OTHER): Payer: Medicare HMO | Admitting: Urology

## 2021-09-29 ENCOUNTER — Ambulatory Visit: Payer: Medicare HMO | Admitting: Urology

## 2021-09-29 VITALS — BP 107/70 | HR 73 | Ht 68.0 in | Wt 180.0 lb

## 2021-09-29 DIAGNOSIS — N401 Enlarged prostate with lower urinary tract symptoms: Secondary | ICD-10-CM | POA: Diagnosis not present

## 2021-09-29 DIAGNOSIS — N138 Other obstructive and reflux uropathy: Secondary | ICD-10-CM | POA: Diagnosis not present

## 2021-09-29 DIAGNOSIS — R339 Retention of urine, unspecified: Secondary | ICD-10-CM

## 2021-09-29 LAB — BLADDER SCAN AMB NON-IMAGING

## 2021-09-29 NOTE — Addendum Note (Signed)
Addended by: Billey Co on: 09/29/2021 05:15 PM   Modules accepted: Level of Service

## 2021-09-29 NOTE — Addendum Note (Signed)
Addended by: Tommy Rainwater on: 09/29/2021 03:33 PM   Modules accepted: Orders

## 2021-09-29 NOTE — Progress Notes (Signed)
Catheter Removal  Patient is present today for a catheter removal.  8ml of water was drained from the balloon. A 18FR coude foley cath was removed from the bladder, no complications were noted. Patient tolerated well.  Performed by: Deetta Siegmann CMA  Follow up/ Additional notes: RTC this afternoon for PVR.   

## 2021-09-29 NOTE — Progress Notes (Signed)
   09/29/2021 5:12 PM   [name] [birth year] [id number]  Reason for visit: Follow up urinary retention  HPI: Comorbid and frail 87 year old male who I originally saw as an add-on in clinic on 08/31/2021 for lower abdominal pain and difficulty urinating, and bladder scan at that time was significantly elevated at 860 mL.  He had been having problems over the last few months with overflow incontinence.  CT in January 2022 showed a 34 g prostate and no hydronephrosis.  A coud catheter was placed at that time with return of yellow urine, but he ended up admitted the next day with gross hematuria, and ultimately required cystoscopy and fulguration of oozing from the prostate with Dr. Bernardo Heater on 09/06/2021.  He failed a voiding trial a week later.  He has been on Flomax and finasteride.  Urine has been clear yellow  His Foley was removed this morning, and he has been unable to urinate, but does not have the urge to void.  Bladder scan is only to 60 mL and he denies any discomfort.  Bactrim was given prior to Foley removal.  I had a long conversation with the patient and his family today about options including Foley replacement in clinic, observation with close follow-up tomorrow morning for repeat bladder scan, or intermittent catheterization teaching.  He is very averse to Foley replacement.  The risks of observation including recurrent retention discussed extensively, and he would like to continue to try to urinate today with an appointment early tomorrow morning for bladder scan and consider Foley replacement if he still has been unable to urinate with significantly elevated PVR.  Despite his comorbidities, he would be a good candidate for UroLift if he continues to fail voiding trials.  Billey Co, [facility] [address] [place], [place] [address] [phone]

## 2021-09-30 ENCOUNTER — Other Ambulatory Visit: Payer: Self-pay | Admitting: Physician Assistant

## 2021-09-30 ENCOUNTER — Ambulatory Visit (INDEPENDENT_AMBULATORY_CARE_PROVIDER_SITE_OTHER): Payer: Medicare HMO | Admitting: Physician Assistant

## 2021-09-30 DIAGNOSIS — N39490 Overflow incontinence: Secondary | ICD-10-CM | POA: Diagnosis not present

## 2021-09-30 DIAGNOSIS — R339 Retention of urine, unspecified: Secondary | ICD-10-CM | POA: Diagnosis not present

## 2021-09-30 DIAGNOSIS — N138 Other obstructive and reflux uropathy: Secondary | ICD-10-CM

## 2021-09-30 LAB — BLADDER SCAN AMB NON-IMAGING

## 2021-09-30 MED ORDER — SULFAMETHOXAZOLE-TRIMETHOPRIM 800-160 MG PO TABS
1.0000 | ORAL_TABLET | Freq: Once | ORAL | Status: AC
Start: 2021-09-30 — End: 2021-09-30
  Administered 2021-09-30: 1 via ORAL

## 2021-09-30 MED ORDER — SULFAMETHOXAZOLE-TRIMETHOPRIM 800-160 MG PO TABS: 1.0000 | ORAL_TABLET | Freq: Two times a day (BID) | ORAL | 0 refills | Status: AC

## 2021-09-30 NOTE — Progress Notes (Signed)
09/30/2021 10:01 AM   Michael Simon 1933/09/12 [id number]  CC: Chief Complaint  Patient presents with   Urinary Retention    HPI: Michael Simon is a 87 y.o. male with PMH recurrent urinary retention and gross hematuria who saw Dr. Diamantina Providence yesterday with equivocal afternoon PVR who presents today for repeat PVR.  He is accompanied today by his son, who contributes to HPI.  Today he reports he had an episode of large-volume urinary incontinence overnight as well as a bowel movement.  He denies the urge to void or lower abdominal pain at this time.  Bladder scan with 950 mL.  PMH: Past Medical History:  Diagnosis Date   Basal cell carcinoma 04/14/2021   left medial clavicle EDC 05/31/2021   Basal cell carcinoma 04/14/2021   nose tip    Basal cell carcinoma (BCC) of skin of nose 02/15/2021   CAD (coronary artery disease)    Diverticulitis 2022   HLD (hyperlipidemia)    HTN (hypertension)    LBBB (left bundle branch block)    Myocardial infarction ([place])    Osteoarthritis    Type II diabetes mellitus ([place])     Surgical History: Past Surgical History:  Procedure Laterality Date   CARDIAC CATHETERIZATION     CATARACT EXTRACTION     COLONOSCOPY     COLONOSCOPY WITH PROPOFOL N/A 08/26/2019   Procedure: COLONOSCOPY WITH PROPOFOL;  Surgeon: [name], MD;  Location: [facility] ENDOSCOPY;  Service: Gastroenterology;  Laterality: N/A;   COLONOSCOPY WITH PROPOFOL N/A 09/22/2019   Procedure: COLONOSCOPY WITH PROPOFOL;  Surgeon: [name], MD;  [facility]: [facility] ENDOSCOPY;  Service: Gastroenterology;  Laterality: N/A;  pediatric colonoscope needed   CORONARY ARTERY BYPASS GRAFT  2012   CYSTOSCOPY WITH FULGERATION N/A 09/06/2021   Procedure: CYSTOSCOPY WITH FULGERATION-Possible Bladder Biospy;  Surgeon: Abbie Sons, MD;  Location: [facility] ORS;  Service: Urology;  Laterality: N/A;   EYE SURGERY     INGUINAL HERNIA REPAIR     JOINT REPLACEMENT     knees   TRANSURETHRAL  RESECTION OF BLADDER TUMOR N/A 09/06/2021   Procedure: TRANSURETHRAL RESECTION OF BLADDER TUMOR (TURBT)-Possible;  Surgeon: Abbie Sons, MD;  Location: [facility] ORS;  Service: Urology;  Laterality: N/A;    Home Medications:  Allergies as of 09/30/2021   No Known Allergies      Medication List        Accurate as of September 30, 2021 10:01 AM. If you have any questions, ask your nurse or doctor.          amiodarone 200 MG tablet Commonly known as: PACERONE Take 1 tablet (200 mg total) by mouth 2 (two) times daily.   apixaban 2.5 MG Tabs tablet Commonly known as: ELIQUIS Hold due to bloody urine.   Cyanocobalamin 1000 MCG Tbcr Take 1,000 mcg by mouth daily.   finasteride 5 MG tablet Commonly known as: PROSCAR Take 1 tablet (5 mg total) by mouth daily.   furosemide 20 MG tablet Commonly known as: LASIX Hold until followup with outpatient doctor since your blood pressure has been low normal without any blood pressure medication.   lisinopril 30 MG tablet Commonly known as: ZESTRIL Hold until followup with outpatient doctor since your blood pressure has been low normal without any blood pressure medication.   lovastatin 40 MG tablet Commonly known as: MEVACOR TAKE 1 TABLET BY MOUTH EVERYDAY AT BEDTIME What changed: See the new instructions.   pantoprazole 40 MG tablet Commonly known as: PROTONIX TAKE  1 TABLET BY MOUTH EVERY DAY   sulfamethoxazole-trimethoprim 800-160 MG tablet Commonly known as: BACTRIM DS Take 1 tablet by mouth 2 (two) times daily for 5 doses. Start on the evening of 10/14. Started by: Debroah Loop, PA-C   tamsulosin 0.4 MG Caps capsule Commonly known as: FLOMAX Take 1 capsule (0.4 mg total) by mouth daily.        Allergies:  No Known Allergies  Family History: Family History  Problem Relation Age of Onset   Arthritis Mother    Heart disease Mother    Heart disease Other     Social History:   reports that he quit smoking  about 13 years ago. His smoking use included cigarettes. He has never used smokeless tobacco. He reports that he does not drink alcohol and does not use drugs.  Physical Exam: There were no vitals taken for this visit.  Constitutional:  Alert and oriented, no acute distress, nontoxic appearing HEENT: [place], AT Cardiovascular: No clubbing, cyanosis, or edema Respiratory: Normal respiratory effort, no increased work of breathing Skin: No rashes, bruises or suspicious lesions Neurologic: Grossly intact, no focal deficits, moving all 4 extremities Psychiatric: Normal mood and affect  Laboratory Data: Results for orders placed or performed in visit on 09/30/21  Bladder Scan (Post Void Residual) in office  Result Value Ref Range   Scan Result 967mL    Simple Catheter Placement  Due to urinary retention patient is present today for a foley cath placement.  Patient was cleaned and prepped in a sterile fashion with betadine and 2% lidocaine jelly was instilled into the urethra. A 18 FR coud foley catheter was inserted, urine return was noted  108ml, urine was yellow in color.  The balloon was filled with 10cc of sterile water.  A leg bag was attached for drainage. Patient was also given a night bag to take home.  Patient tolerated well, no complications were noted   Performed by: Debroah Loop, PA-C   Assessment & Plan:   1. Urinary retention Recurrent urinary retention noted on presentation today.  I explained that his episode of leakage overnight likely represented overflow incontinence.  I offered the patient Foley catheter replacement versus CIC teaching.  Together with his son, they elected to proceed with Foley catheter replacement.  See procedure note above for further information.  I counseled the patient to stay well-hydrated over the next 24 to 48 hours due to concerns for possible postobstructive diuresis in the setting of large volume urinary retention.  Notably, patient was  administered 1 dose of Bactrim DS prior to Foley catheter placement in clinic today and prescribed 5 additional doses to take at home for a total of 3 days of antibiotic prophylaxis.  Patient wishes to proceed with UroLift scheduling.  Orders placed today.  Will obtain cardiac clearance from Dr. [name] prior.  Patient's son requests that surgical scheduling work directly through his daughter, info in chart, to arrange surgery. - Bladder Scan (Post Void Residual) in office - sulfamethoxazole-trimethoprim (BACTRIM DS) 800-160 MG tablet; Take 1 tablet by mouth 2 (two) times daily for 5 doses. Start on the evening of 10/14.  Dispense: 5 tablet; Refill: 0 - sulfamethoxazole-trimethoprim (BACTRIM DS) 800-160 MG per tablet 1 tablet  Return for Surgical scheduling to call to arrange UroLift.  Debroah Loop, PA-C  [facility] [address] [place], [place] [address] [phone]

## 2021-09-30 NOTE — Progress Notes (Signed)
Surgical Physician Order Form  Dr. Diamantina Providence Scheduling expectation : Next Available  *Length of Case:   *Clearance needed: yes, Dr. [name]  *Anticoagulation Instructions: Hold all anticoagulants  *Aspirin Instructions: N/A  *Post-op visit Date/Instructions:  1-3 day voiding trial  *Diagnosis: BPH w/BOO  *Procedure:  Urolift  -Admit type: OUTpatient  -Anesthesia: General  -VTE Prophylaxis Standing Order SCD's       Other:   -Standing Lab Orders Per Anesthesia    Lab other: UA&Urine Culture  -Standing Test orders EKG/Chest x-ray per Anesthesia       Test other:   - Medications:     Ancef 2gm IV   Other Instructions: Please contact patient's daughter, phone number in chart, to schedule.

## 2021-10-04 ENCOUNTER — Ambulatory Visit: Payer: Medicare HMO | Admitting: Dermatology

## 2021-10-04 ENCOUNTER — Encounter: Payer: Self-pay | Admitting: Internal Medicine

## 2021-10-04 ENCOUNTER — Ambulatory Visit (INDEPENDENT_AMBULATORY_CARE_PROVIDER_SITE_OTHER): Payer: Medicare HMO | Admitting: Internal Medicine

## 2021-10-04 ENCOUNTER — Other Ambulatory Visit: Payer: Self-pay

## 2021-10-04 ENCOUNTER — Ambulatory Visit
Admission: RE | Admit: 2021-10-04 | Discharge: 2021-10-04 | Disposition: A | Discharge status: Home / Self Care | Payer: Medicare HMO | Attending: Internal Medicine | Admitting: Internal Medicine

## 2021-10-04 ENCOUNTER — Ambulatory Visit
Admission: RE | Admit: 2021-10-04 | Discharge: 2021-10-04 | Disposition: A | Discharge status: Home / Self Care | Payer: Medicare HMO | Source: Ambulatory Visit | Attending: Internal Medicine | Admitting: Internal Medicine

## 2021-10-04 DIAGNOSIS — E1122 Type 2 diabetes mellitus with diabetic chronic kidney disease: Secondary | ICD-10-CM | POA: Diagnosis not present

## 2021-10-04 DIAGNOSIS — I10 Essential (primary) hypertension: Secondary | ICD-10-CM

## 2021-10-04 DIAGNOSIS — N184 Chronic kidney disease, stage 4 (severe): Secondary | ICD-10-CM | POA: Diagnosis not present

## 2021-10-04 DIAGNOSIS — S2231XA Fracture of one rib, right side, initial encounter for closed fracture: Secondary | ICD-10-CM | POA: Diagnosis not present

## 2021-10-04 DIAGNOSIS — R531 Weakness: Secondary | ICD-10-CM | POA: Diagnosis present

## 2021-10-04 DIAGNOSIS — R0989 Other specified symptoms and signs involving the circulatory and respiratory systems: Secondary | ICD-10-CM | POA: Diagnosis not present

## 2021-10-04 DIAGNOSIS — E118 Type 2 diabetes mellitus with unspecified complications: Secondary | ICD-10-CM

## 2021-10-04 DIAGNOSIS — N4282 Prostatosis syndrome: Secondary | ICD-10-CM

## 2021-10-04 DIAGNOSIS — J189 Pneumonia, unspecified organism: Secondary | ICD-10-CM | POA: Diagnosis not present

## 2021-10-04 DIAGNOSIS — I483 Typical atrial flutter: Secondary | ICD-10-CM

## 2021-10-04 NOTE — Assessment & Plan Note (Signed)
Patient will be referred to GU

## 2021-10-04 NOTE — Progress Notes (Signed)
Established Patient Office Visit  Subjective:  Patient ID: Michael Simon, male    DOB: 11-29-1933  Age: 87 y.o. MRN: [id number]  CC:  Chief Complaint  Patient presents with   lab results   Weakness    Weakness Associated symptoms include weakness.   Michael Simon presents for puffiness of the eyes rattling in the chest.  He has been given blood from transfusion for anemia due to hematuria in the hospital, complaint of weakness tiredness fatigability CBC and metabolic panel was drawn.  Denies any chest pain complains of rattling in the chest so a chest x-ray will be obtained in the hospital  Past Medical History:  Diagnosis Date   Basal cell carcinoma 04/14/2021   left medial clavicle EDC 05/31/2021   Basal cell carcinoma 04/14/2021   nose tip    Basal cell carcinoma (BCC) of skin of nose 02/15/2021   CAD (coronary artery disease)    Diverticulitis 2022   HLD (hyperlipidemia)    HTN (hypertension)    LBBB (left bundle branch block)    Myocardial infarction ([place])    Osteoarthritis    Type II diabetes mellitus ([place])     Past Surgical History:  Procedure Laterality Date   CARDIAC CATHETERIZATION     CATARACT EXTRACTION     COLONOSCOPY     COLONOSCOPY WITH PROPOFOL N/A 08/26/2019   Procedure: COLONOSCOPY WITH PROPOFOL;  Surgeon: [name], MD;  Location: [facility] ENDOSCOPY;  Service: Gastroenterology;  Laterality: N/A;   COLONOSCOPY WITH PROPOFOL N/A 09/22/2019   Procedure: COLONOSCOPY WITH PROPOFOL;  Surgeon: [name], MD;  Location: [facility] ENDOSCOPY;  Service: Gastroenterology;  Laterality: N/A;  pediatric colonoscope needed   CORONARY ARTERY BYPASS GRAFT  2012   CYSTOSCOPY WITH FULGERATION N/A 09/06/2021   Procedure: CYSTOSCOPY WITH FULGERATION-Possible Bladder Biospy;  Surgeon: Abbie Sons, MD;  Location: [facility] ORS;  Service: Urology;  Laterality: N/A;   EYE SURGERY     INGUINAL HERNIA REPAIR     JOINT REPLACEMENT     knees   TRANSURETHRAL RESECTION OF  BLADDER TUMOR N/A 09/06/2021   Procedure: TRANSURETHRAL RESECTION OF BLADDER TUMOR (TURBT)-Possible;  Surgeon: Abbie Sons, MD;  Location: [facility] ORS;  Service: Urology;  Laterality: N/A;    Family History  Problem Relation Age of Onset   Arthritis Mother    Heart disease Mother    Heart disease Other     Social History   Socioeconomic History   Marital status: Widowed    Spouse name: Not on file   Number of children: Not on file   Years of education: Not on file   Highest education level: Not on file  Occupational History   Not on file  Tobacco Use   Smoking status: Former    Types: Cigarettes    Quit date: 03/03/2008    Years since quitting: 13.5   Smokeless tobacco: Never  Vaping Use   Vaping Use: Never used  Substance and Sexual Activity   Alcohol use: No    Alcohol/week: 0.0 standard drinks   Drug use: No   Sexual activity: Not on file  Other Topics Concern   Not on file  Social History Narrative   Not on file   Social Determinants of Health   Financial Resource Strain: Not on file  Food Insecurity: Not on file  Transportation Needs: Not on file  Physical Activity: Not on file  Stress: Not on file  Social Connections: Not on file  Intimate Partner Violence:  Not on file     Current Outpatient Medications:    amiodarone (PACERONE) 200 MG tablet, Take 1 tablet (200 mg total) by mouth 2 (two) times daily., Disp: 60 tablet, Rfl: 1   apixaban (ELIQUIS) 2.5 MG TABS tablet, Hold due to bloody urine., Disp: 60 tablet, Rfl: 2   Cyanocobalamin 1000 MCG TBCR, Take 1,000 mcg by mouth daily. , Disp: , Rfl:    finasteride (PROSCAR) 5 MG tablet, Take 1 tablet (5 mg total) by mouth daily., Disp: 30 tablet, Rfl: 11   furosemide (LASIX) 20 MG tablet, Hold until followup with outpatient doctor since your blood pressure has been low normal without any blood pressure medication., Disp: 90 tablet, Rfl: 1   lisinopril (ZESTRIL) 30 MG tablet, Hold until followup with outpatient  doctor since your blood pressure has been low normal without any blood pressure medication., Disp: 90 tablet, Rfl: 3   lovastatin (MEVACOR) 40 MG tablet, TAKE 1 TABLET BY MOUTH EVERYDAY AT BEDTIME (Patient taking differently: Take 40 mg by mouth at bedtime.), Disp: 90 tablet, Rfl: 2   pantoprazole (PROTONIX) 40 MG tablet, TAKE 1 TABLET BY MOUTH EVERY DAY, Disp: 90 tablet, Rfl: 3   tamsulosin (FLOMAX) 0.4 MG CAPS capsule, Take 1 capsule (0.4 mg total) by mouth daily., Disp: 30 capsule, Rfl: 3   No Known Allergies  ROS Review of Systems  Constitutional: Negative.   HENT: Negative.    Eyes: Negative.   Respiratory: Negative.    Cardiovascular: Negative.   Gastrointestinal: Negative.   Endocrine: Negative.   Genitourinary: Negative.   Musculoskeletal: Negative.   Skin: Negative.   Allergic/Immunologic: Negative.   Neurological:  Positive for weakness.  Hematological: Negative.   Psychiatric/Behavioral: Negative.    All other systems reviewed and are negative.    Objective:    Physical Exam Vitals reviewed.  Constitutional:      Appearance: Normal appearance.  HENT:     Mouth/Throat:     Mouth: Mucous membranes are moist.  Eyes:     Pupils: Pupils are equal, round, and reactive to light.  Neck:     Vascular: No carotid bruit.  Cardiovascular:     Rate and Rhythm: Normal rate and regular rhythm.     Pulses: Normal pulses.     Heart sounds: Normal heart sounds.  Pulmonary:     Effort: Pulmonary effort is normal.     Breath sounds: Normal breath sounds.  Abdominal:     General: Bowel sounds are normal.     Palpations: Abdomen is soft. There is no hepatomegaly, splenomegaly or mass.     Tenderness: There is no abdominal tenderness.     Hernia: No hernia is present.  Musculoskeletal:     Cervical back: Neck supple.     Right lower leg: No edema.     Left lower leg: No edema.  Skin:    Findings: No rash.  Neurological:     Mental Status: He is alert and oriented to  person, place, and time.     Motor: No weakness.  Psychiatric:        Mood and Affect: Mood normal.        Behavior: Behavior normal.    There were no vitals taken for this visit. Wt Readings from Last 3 Encounters:  09/29/21 180 lb (81.6 kg)  09/06/21 172 lb (78 kg)  08/31/21 170 lb (77.1 kg)     Health Maintenance Due  Topic Date Due   FOOT EXAM  Never done  OPHTHALMOLOGY EXAM  Never done   TETANUS/TDAP  Never done   Zoster Vaccines- Shingrix (1 of 2) Never done   COVID-19 Vaccine (3 - Pfizer risk series) 04/08/2020    There are no preventive care reminders to display for this patient.  Lab Results  Component Value Date   TSH 3.698 01/02/2021   Lab Results  Component Value Date   WBC 11.4 (H) 09/12/2021   HGB 9.3 (L) 09/12/2021   HCT 28.5 (L) 09/12/2021   MCV 96.3 09/12/2021   PLT 154 09/12/2021   Lab Results  Component Value Date   NA 134 (L) 09/10/2021   K 5.1 09/10/2021   CO2 29 09/10/2021   GLUCOSE 160 (H) 09/10/2021   BUN 41 (H) 09/10/2021   CREATININE 1.93 (H) 09/10/2021   BILITOT 0.7 09/01/2021   ALKPHOS 111 09/01/2021   AST 25 09/01/2021   ALT 13 09/01/2021   PROT 6.2 (L) 09/01/2021   ALBUMIN 3.7 09/01/2021   CALCIUM 8.4 (L) 09/10/2021   ANIONGAP 7 09/10/2021   EGFR 23 (L) 08/31/2021   No results found for: CHOL No results found for: HDL No results found for: LDLCALC No results found for: TRIG No results found for: [facility] Lab Results  Component Value Date   HGBA1C 7.6 (H) 09/01/2021      Assessment & Plan:  We will get a chest x-ray and also do CBC and metabolic panel Problem List Items Addressed This Visit       Cardiovascular and Mediastinum   Essential hypertension    Blood pressure is under control on present treatment      Atrial flutter (HCC)    Chronic problem.  Anticoagulation was discontinued because of hematuria and hypotension        Endocrine   Diabetes mellitus with complication ([place])    - The patient's blood  sugar is labile on med. - The patient will continue the current treatment regimen.  - I encouraged the patient to regularly check blood sugar.  - I encouraged the patient to monitor diet. I encouraged the patient to eat low-carb and low-sugar to help prevent blood sugar spikes.  - I encouraged the patient to continue following their prescribed treatment plan for diabetes - I informed the patient to get help if blood sugar drops below 44m/dL, or if suddenly have trouble thinking clearly or breathing.  Patient was advised to buy a book on diabetes from a local bookstore or from [place]  Patient should read 2 chapters every day to keep the motivation going, this is in addition to some of the materials we provided them from the office.  There are other resources on the Internet like YouTube and wilkipedia to get an education on the diabetes      CKD stage 4 due to type 2 diabetes mellitus [place]    Chronic problem we will check metabolic panel        Genitourinary   Prostatitis syndrome    Patient will be referred to GU        Other   Weakness - Primary    Patient is wheelchair-bound, he complains of weakness likely due to anemia, we will check CBC      Relevant Orders   CBC with Differential/Platelet   COMPLETE METABOLIC PANEL WITH GFR   DG Chest 2 View    No orders of the defined types were placed in this encounter.   Follow-up: No follow-ups on file.    JCletis Athens MD

## 2021-10-04 NOTE — Assessment & Plan Note (Signed)

## 2021-10-04 NOTE — Assessment & Plan Note (Signed)
Chronic problem we will check metabolic panel

## 2021-10-04 NOTE — Assessment & Plan Note (Signed)
Patient is wheelchair-bound, he complains of weakness likely due to anemia, we will check CBC

## 2021-10-04 NOTE — Assessment & Plan Note (Signed)
Blood pressure is under control on present treatment

## 2021-10-04 NOTE — Assessment & Plan Note (Signed)
Chronic problem.  Anticoagulation was discontinued because of hematuria and hypotension

## 2021-10-05 ENCOUNTER — Other Ambulatory Visit: Payer: Self-pay

## 2021-10-05 DIAGNOSIS — D509 Iron deficiency anemia, unspecified: Secondary | ICD-10-CM

## 2021-10-05 LAB — CBC WITH DIFFERENTIAL/PLATELET
Absolute Monocytes: 778 cells/uL (ref 200–950)
Basophils Absolute: 73 cells/uL (ref 0–200)
Basophils Relative: 0.9 %
Eosinophils Absolute: 478 cells/uL (ref 15–500)
Eosinophils Relative: 5.9 %
HCT: 26.2 % — ABNORMAL LOW (ref 38.5–50.0)
Hemoglobin: 8.5 g/dL — ABNORMAL LOW (ref 13.2–17.1)
Lymphs Abs: 883 cells/uL (ref 850–3900)
MCH: 31.4 pg (ref 27.0–33.0)
MCHC: 32.4 g/dL (ref 32.0–36.0)
MCV: 96.7 fL (ref 80.0–100.0)
MPV: 11.6 fL (ref 7.5–12.5)
Monocytes Relative: 9.6 %
Neutro Abs: 5889 cells/uL (ref 1500–7800)
Neutrophils Relative %: 72.7 %
Platelets: 208 10*3/uL (ref 140–400)
RBC: 2.71 10*6/uL — ABNORMAL LOW (ref 4.20–5.80)
RDW: 14.4 % (ref 11.0–15.0)
Total Lymphocyte: 10.9 %
WBC: 8.1 10*3/uL (ref 3.8–10.8)

## 2021-10-05 LAB — COMPLETE METABOLIC PANEL WITH GFR
AG Ratio: 1.3 (calc) (ref 1.0–2.5)
ALT: 15 U/L (ref 9–46)
AST: 21 U/L (ref 10–35)
Albumin: 3.4 g/dL — ABNORMAL LOW (ref 3.6–5.1)
Alkaline phosphatase (APISO): 146 U/L — ABNORMAL HIGH (ref 35–144)
BUN/Creatinine Ratio: 11 (calc) (ref 6–22)
BUN: 20 mg/dL (ref 7–25)
CO2: 27 mmol/L (ref 20–32)
Calcium: 7.8 mg/dL — ABNORMAL LOW (ref 8.6–10.3)
Chloride: 100 mmol/L (ref 98–110)
Creat: 1.84 mg/dL — ABNORMAL HIGH (ref 0.70–1.22)
Globulin: 2.7 g/dL (calc) (ref 1.9–3.7)
Glucose, Bld: 135 mg/dL — ABNORMAL HIGH (ref 65–99)
Potassium: 4.6 mmol/L (ref 3.5–5.3)
Sodium: 136 mmol/L (ref 135–146)
Total Bilirubin: 0.5 mg/dL (ref 0.2–1.2)
Total Protein: 6.1 g/dL (ref 6.1–8.1)
eGFR: 35 mL/min/{1.73_m2} — ABNORMAL LOW (ref >=60)

## 2021-10-05 MED ORDER — LEVOFLOXACIN 250 MG PO TABS: 250.0000 mg | ORAL_TABLET | Freq: Every day | ORAL | 0 refills | Status: DC

## 2021-10-06 DIAGNOSIS — N138 Other obstructive and reflux uropathy: Secondary | ICD-10-CM | POA: Diagnosis not present

## 2021-10-06 DIAGNOSIS — N184 Chronic kidney disease, stage 4 (severe): Secondary | ICD-10-CM | POA: Diagnosis not present

## 2021-10-06 DIAGNOSIS — N401 Enlarged prostate with lower urinary tract symptoms: Secondary | ICD-10-CM | POA: Diagnosis not present

## 2021-10-06 DIAGNOSIS — E538 Deficiency of other specified B group vitamins: Secondary | ICD-10-CM | POA: Diagnosis not present

## 2021-10-06 DIAGNOSIS — E1122 Type 2 diabetes mellitus with diabetic chronic kidney disease: Secondary | ICD-10-CM | POA: Diagnosis not present

## 2021-10-06 DIAGNOSIS — I10 Essential (primary) hypertension: Secondary | ICD-10-CM | POA: Diagnosis not present

## 2021-10-06 DIAGNOSIS — F0280 Dementia in other diseases classified elsewhere without behavioral disturbance: Secondary | ICD-10-CM | POA: Diagnosis not present

## 2021-10-06 DIAGNOSIS — R338 Other retention of urine: Secondary | ICD-10-CM | POA: Diagnosis not present

## 2021-10-06 DIAGNOSIS — J189 Pneumonia, unspecified organism: Secondary | ICD-10-CM | POA: Diagnosis not present

## 2021-10-10 ENCOUNTER — Telehealth: Payer: Self-pay | Admitting: Urology

## 2021-10-10 NOTE — Telephone Encounter (Signed)
I called pt. Today and spoke to son Albert Hersch who informed me that he just called EMS due to pt. Having weakness and he feels this is not a good time to schedule surgery. Rickie Wichman ask me to call his sister who is listed on the DPR to schedule surgery at a later time.

## 2021-10-11 ENCOUNTER — Other Ambulatory Visit: Payer: Self-pay

## 2021-10-11 ENCOUNTER — Encounter: Payer: Self-pay | Admitting: Internal Medicine

## 2021-10-11 ENCOUNTER — Ambulatory Visit (INDEPENDENT_AMBULATORY_CARE_PROVIDER_SITE_OTHER): Payer: Medicare HMO | Admitting: Internal Medicine

## 2021-10-11 VITALS — BP 104/52 | HR 68

## 2021-10-11 DIAGNOSIS — D649 Anemia, unspecified: Secondary | ICD-10-CM | POA: Diagnosis not present

## 2021-10-11 DIAGNOSIS — R9389 Abnormal findings on diagnostic imaging of other specified body structures: Secondary | ICD-10-CM | POA: Diagnosis not present

## 2021-10-11 DIAGNOSIS — E119 Type 2 diabetes mellitus without complications: Secondary | ICD-10-CM

## 2021-10-11 DIAGNOSIS — N184 Chronic kidney disease, stage 4 (severe): Secondary | ICD-10-CM

## 2021-10-11 DIAGNOSIS — R310 Gross hematuria: Secondary | ICD-10-CM | POA: Diagnosis not present

## 2021-10-11 DIAGNOSIS — R531 Weakness: Secondary | ICD-10-CM | POA: Diagnosis not present

## 2021-10-11 DIAGNOSIS — E1122 Type 2 diabetes mellitus with diabetic chronic kidney disease: Secondary | ICD-10-CM

## 2021-10-11 LAB — GLUCOSE, POCT (MANUAL RESULT ENTRY): POC Glucose: 273 mg/dl — AB (ref 70–99)

## 2021-10-11 NOTE — Assessment & Plan Note (Signed)
Patient will get iron infusion tomorrow

## 2021-10-11 NOTE — Progress Notes (Signed)
Established Patient Office Visit  Subjective:  Patient ID: Michael Simon, male    DOB: [birth year]  Age: 87 y.o. MRN: [id number]  CC:  Chief Complaint  Patient presents with   Diabetes    Patient checked blood sugar at home.       [name] presents for weakness, sleeping  too much , not  taking  lisinopril, off blood thinner  Past Medical History:  Diagnosis Date   Basal cell carcinoma 04/14/2021   left medial clavicle EDC 05/31/2021   Basal cell carcinoma 04/14/2021   nose tip    Basal cell carcinoma (BCC) of skin of nose 02/15/2021   CAD (coronary artery disease)    Diverticulitis 2022   HLD (hyperlipidemia)    HTN (hypertension)    LBBB (left bundle branch block)    Myocardial infarction ([place])    Osteoarthritis    Type II diabetes mellitus ([place])     Past Surgical History:  Procedure Laterality Date   CARDIAC CATHETERIZATION     CATARACT EXTRACTION     COLONOSCOPY     COLONOSCOPY WITH PROPOFOL N/A 08/26/2019   Procedure: COLONOSCOPY WITH PROPOFOL;  Surgeon: [name], MD;  Location: [facility] ENDOSCOPY;  Service: Gastroenterology;  Laterality: N/A;   COLONOSCOPY WITH PROPOFOL N/A 09/22/2019   Procedure: COLONOSCOPY WITH PROPOFOL;  Surgeon: [name], MD;  Location: [facility] ENDOSCOPY;  Service: Gastroenterology;  Laterality: N/A;  pediatric colonoscope needed   CORONARY ARTERY BYPASS GRAFT  2012   CYSTOSCOPY WITH FULGERATION N/A 09/06/2021   Procedure: CYSTOSCOPY WITH FULGERATION-Possible Bladder Biospy;  Surgeon: Abbie Sons, MD;  Location: [facility] ORS;  Service: Urology;  Laterality: N/A;   EYE SURGERY     INGUINAL HERNIA REPAIR     JOINT REPLACEMENT     knees   TRANSURETHRAL RESECTION OF BLADDER TUMOR N/A 09/06/2021   Procedure: TRANSURETHRAL RESECTION OF BLADDER TUMOR (TURBT)-Possible;  Surgeon: Abbie Sons, MD;  Location: [facility] ORS;  Service: Urology;  Laterality: N/A;    Family History  Problem Relation Age of Onset   Arthritis Mother     Heart disease Mother    Heart disease Other     Social History   Socioeconomic History   Marital status: Widowed    Spouse name: Not on file   Number of children: Not on file   Years of education: Not on file   Highest education level: Not on file  Occupational History   Not on file  Tobacco Use   Smoking status: Former    Types: Cigarettes    Quit date: 03/03/2008    Years since quitting: 13.6   Smokeless tobacco: Never  Vaping Use   Vaping Use: Never used  Substance and Sexual Activity   Alcohol use: No    Alcohol/week: 0.0 standard drinks   Drug use: No   Sexual activity: Not on file  Other Topics Concern   Not on file  Social History Narrative   Not on file   Social Determinants of Health   Financial Resource Strain: Not on file  Food Insecurity: Not on file  Transportation Needs: Not on file  Physical Activity: Not on file  Stress: Not on file  Social Connections: Not on file  Intimate Partner Violence: Not on file     Current Outpatient Medications:    Cyanocobalamin 1000 MCG TBCR, Take 1,000 mcg by mouth daily. , Disp: , Rfl:    finasteride (PROSCAR) 5 MG tablet, Take 1 tablet (5 mg total)  by mouth daily., Disp: 30 tablet, Rfl: 11   levofloxacin (LEVAQUIN) 250 MG tablet, Take 1 tablet (250 mg total) by mouth daily., Disp: 7 tablet, Rfl: 0   lovastatin (MEVACOR) 40 MG tablet, TAKE 1 TABLET BY MOUTH EVERYDAY AT BEDTIME (Patient taking differently: Take 40 mg by mouth at bedtime.), Disp: 90 tablet, Rfl: 2   pantoprazole (PROTONIX) 40 MG tablet, TAKE 1 TABLET BY MOUTH EVERY DAY, Disp: 90 tablet, Rfl: 3   tamsulosin (FLOMAX) 0.4 MG CAPS capsule, Take 1 capsule (0.4 mg total) by mouth daily., Disp: 30 capsule, Rfl: 3   No Known Allergies  ROS Review of Systems  Constitutional: Negative.   HENT: Negative.    Eyes: Negative.   Respiratory: Negative.    Cardiovascular: Negative.   Gastrointestinal: Negative.   Endocrine: Negative.   Genitourinary: Negative.    Musculoskeletal: Negative.   Skin: Negative.   Allergic/Immunologic: Negative.   Neurological: Negative.   Hematological: Negative.   Psychiatric/Behavioral: Negative.    All other systems reviewed and are negative.    Objective:    Physical Exam Vitals reviewed.  Constitutional:      Appearance: Normal appearance.  HENT:     Mouth/Throat:     Mouth: Mucous membranes are moist.  Eyes:     Pupils: Pupils are equal, round, and reactive to light.  Neck:     Vascular: No carotid bruit.  Cardiovascular:     Rate and Rhythm: Normal rate and regular rhythm.     Pulses: Normal pulses.     Heart sounds: Normal heart sounds.  Pulmonary:     Effort: Pulmonary effort is normal.     Breath sounds: Normal breath sounds.  Abdominal:     General: Bowel sounds are normal.     Palpations: Abdomen is soft. There is no hepatomegaly, splenomegaly or mass.     Tenderness: There is no abdominal tenderness.     Hernia: No hernia is present.  Musculoskeletal:     Cervical back: Neck supple.     Right lower leg: No edema.     Left lower leg: No edema.  Skin:    Findings: No rash.  Neurological:     Mental Status: He is alert and oriented to person, place, and time.     Motor: No weakness.  Psychiatric:        Mood and Affect: Mood normal.        Behavior: Behavior normal.    BP (!) 104/52   Pulse 68  Wt Readings from Last 3 Encounters:  09/29/21 180 lb (81.6 kg)  09/06/21 172 lb (78 kg)  08/31/21 170 lb (77.1 kg)     Health Maintenance Due  Topic Date Due   FOOT EXAM  Never done   OPHTHALMOLOGY EXAM  Never done   URINE MICROALBUMIN  Never done   TETANUS/TDAP  Never done   Zoster Vaccines- Shingrix (1 of 2) Never done   Pneumonia Vaccine 4+ Years old (2 - PCV) 03/10/2012   COVID-19 Vaccine (3 - Pfizer risk series) 04/08/2020    There are no preventive care reminders to display for this patient.  Lab Results  Component Value Date   TSH 3.698 01/02/2021   Lab Results   Component Value Date   WBC 8.1 10/04/2021   HGB 8.5 (L) 10/04/2021   HCT 26.2 (L) 10/04/2021   MCV 96.7 10/04/2021   PLT 208 10/04/2021   Lab Results  Component Value Date   NA 136 10/04/2021  K 4.6 10/04/2021   CO2 27 10/04/2021   GLUCOSE 135 (H) 10/04/2021   BUN 20 10/04/2021   CREATININE 1.84 (H) 10/04/2021   BILITOT 0.5 10/04/2021   ALKPHOS 111 09/01/2021   AST 21 10/04/2021   ALT 15 10/04/2021   PROT 6.1 10/04/2021   ALBUMIN 3.7 09/01/2021   CALCIUM 7.8 (L) 10/04/2021   ANIONGAP 7 09/10/2021   EGFR 35 (L) 10/04/2021   No results found for: CHOL No results found for: HDL No results found for: LDLCALC No results found for: TRIG No results found for: CHOLHDL Lab Results  Component Value Date   HGBA1C 7.6 (H) 09/01/2021      Assessment & Plan:   Problem List Items Addressed This Visit       Endocrine   CKD stage 4 due to type 2 diabetes mellitus ([place])    We will check CBC and met b        Genitourinary   Hematuria, gross    Hematuria has stopped        Other   Generalized weakness    Due to anemia and pneumonia      Anemia    Patient will get iron infusion tomorrow      Other Visit Diagnoses     Type 2 diabetes mellitus without complication, without long-term current use of insulin (HCC)    -  Primary   Relevant Orders   POCT glucose (manual entry) (Completed)   Abnormal chest x-ray       Relevant Orders   CBC with Differential/Platelet   COMPLETE METABOLIC PANEL WITH GFR   DG Chest 2 View       No orders of the defined types were placed in this encounter.   Follow-up: will repeat chest xray   [name], MD

## 2021-10-11 NOTE — Assessment & Plan Note (Signed)
Blood pressure islow so we will stop the Flomax

## 2021-10-11 NOTE — Assessment & Plan Note (Signed)
Hematuria has stopped

## 2021-10-11 NOTE — Assessment & Plan Note (Signed)
Due to anemia and pneumonia

## 2021-10-11 NOTE — Assessment & Plan Note (Signed)
We will check CBC and met b

## 2021-10-12 ENCOUNTER — Encounter: Payer: Self-pay | Admitting: Oncology

## 2021-10-12 ENCOUNTER — Encounter: Payer: Self-pay | Admitting: Internal Medicine

## 2021-10-12 ENCOUNTER — Ambulatory Visit
Admission: RE | Admit: 2021-10-12 | Discharge: 2021-10-12 | Disposition: A | Discharge status: Home / Self Care | Payer: Medicare HMO | Source: Ambulatory Visit | Attending: Internal Medicine | Admitting: Internal Medicine

## 2021-10-12 ENCOUNTER — Inpatient Hospital Stay: Payer: Medicare HMO

## 2021-10-12 ENCOUNTER — Inpatient Hospital Stay: Payer: Medicare HMO | Attending: Oncology | Admitting: Oncology

## 2021-10-12 ENCOUNTER — Ambulatory Visit
Admission: RE | Admit: 2021-10-12 | Discharge: 2021-10-12 | Disposition: A | Discharge status: Home / Self Care | Payer: Medicare HMO | Source: Home / Self Care | Attending: Internal Medicine | Admitting: Internal Medicine

## 2021-10-12 ENCOUNTER — Ambulatory Visit (INDEPENDENT_AMBULATORY_CARE_PROVIDER_SITE_OTHER): Payer: Medicare HMO | Admitting: Internal Medicine

## 2021-10-12 VITALS — BP 109/62 | HR 79 | Ht 68.0 in | Wt 173.1 lb

## 2021-10-12 VITALS — BP 99/55 | HR 76 | Temp 98.3°F | Resp 16

## 2021-10-12 DIAGNOSIS — R531 Weakness: Secondary | ICD-10-CM | POA: Diagnosis not present

## 2021-10-12 DIAGNOSIS — Z01818 Encounter for other preprocedural examination: Secondary | ICD-10-CM

## 2021-10-12 DIAGNOSIS — D62 Acute posthemorrhagic anemia: Secondary | ICD-10-CM

## 2021-10-12 DIAGNOSIS — I2510 Atherosclerotic heart disease of native coronary artery without angina pectoris: Secondary | ICD-10-CM

## 2021-10-12 DIAGNOSIS — I959 Hypotension, unspecified: Secondary | ICD-10-CM

## 2021-10-12 DIAGNOSIS — I517 Cardiomegaly: Secondary | ICD-10-CM | POA: Diagnosis not present

## 2021-10-12 DIAGNOSIS — I10 Essential (primary) hypertension: Secondary | ICD-10-CM

## 2021-10-12 DIAGNOSIS — R9389 Abnormal findings on diagnostic imaging of other specified body structures: Secondary | ICD-10-CM

## 2021-10-12 DIAGNOSIS — N289 Disorder of kidney and ureter, unspecified: Secondary | ICD-10-CM | POA: Diagnosis not present

## 2021-10-12 DIAGNOSIS — D649 Anemia, unspecified: Secondary | ICD-10-CM

## 2021-10-12 DIAGNOSIS — R0902 Hypoxemia: Secondary | ICD-10-CM | POA: Diagnosis not present

## 2021-10-12 DIAGNOSIS — Z87891 Personal history of nicotine dependence: Secondary | ICD-10-CM | POA: Insufficient documentation

## 2021-10-12 DIAGNOSIS — E118 Type 2 diabetes mellitus with unspecified complications: Secondary | ICD-10-CM | POA: Diagnosis not present

## 2021-10-12 DIAGNOSIS — D509 Iron deficiency anemia, unspecified: Secondary | ICD-10-CM | POA: Diagnosis present

## 2021-10-12 LAB — LACTATE DEHYDROGENASE: LDH: 207 U/L — ABNORMAL HIGH (ref 98–192)

## 2021-10-12 LAB — COMPLETE METABOLIC PANEL WITH GFR
AG Ratio: 1.3 (calc) (ref 1.0–2.5)
ALT: 10 U/L (ref 9–46)
AST: 18 U/L (ref 10–35)
Albumin: 3.3 g/dL — ABNORMAL LOW (ref 3.6–5.1)
Alkaline phosphatase (APISO): 167 U/L — ABNORMAL HIGH (ref 35–144)
BUN/Creatinine Ratio: 15 (calc) (ref 6–22)
BUN: 28 mg/dL — ABNORMAL HIGH (ref 7–25)
CO2: 23 mmol/L (ref 20–32)
Calcium: 7.9 mg/dL — ABNORMAL LOW (ref 8.6–10.3)
Chloride: 102 mmol/L (ref 98–110)
Creat: 1.82 mg/dL — ABNORMAL HIGH (ref 0.70–1.22)
Globulin: 2.6 g/dL (calc) (ref 1.9–3.7)
Glucose, Bld: 187 mg/dL — ABNORMAL HIGH (ref 65–99)
Potassium: 4.8 mmol/L (ref 3.5–5.3)
Sodium: 137 mmol/L (ref 135–146)
Total Bilirubin: 0.4 mg/dL (ref 0.2–1.2)
Total Protein: 5.9 g/dL — ABNORMAL LOW (ref 6.1–8.1)
eGFR: 36 mL/min/{1.73_m2} — ABNORMAL LOW (ref >=60)

## 2021-10-12 LAB — CBC WITH DIFFERENTIAL/PLATELET
Absolute Monocytes: 844 cells/uL (ref 200–950)
Basophils Absolute: 39 cells/uL (ref 0–200)
Basophils Relative: 0.4 %
Eosinophils Absolute: 223 cells/uL (ref 15–500)
Eosinophils Relative: 2.3 %
HCT: 27.6 % — ABNORMAL LOW (ref 38.5–50.0)
Hemoglobin: 9 g/dL — ABNORMAL LOW (ref 13.2–17.1)
Lymphs Abs: 1038 cells/uL (ref 850–3900)
MCH: 31.5 pg (ref 27.0–33.0)
MCHC: 32.6 g/dL (ref 32.0–36.0)
MCV: 96.5 fL (ref 80.0–100.0)
MPV: 11.5 fL (ref 7.5–12.5)
Monocytes Relative: 8.7 %
Neutro Abs: 7556 cells/uL (ref 1500–7800)
Neutrophils Relative %: 77.9 %
Platelets: 198 10*3/uL (ref 140–400)
RBC: 2.86 10*6/uL — ABNORMAL LOW (ref 4.20–5.80)
RDW: 14.4 % (ref 11.0–15.0)
Total Lymphocyte: 10.7 %
WBC: 9.7 10*3/uL (ref 3.8–10.8)

## 2021-10-12 LAB — IRON AND TIBC
Iron: 39 ug/dL — ABNORMAL LOW (ref 45–182)
Saturation Ratios: 14 % — ABNORMAL LOW (ref 17.9–39.5)
TIBC: 276 ug/dL (ref 250–450)
UIBC: 237 ug/dL

## 2021-10-12 LAB — SPECIMEN COMPROMISED

## 2021-10-12 LAB — FERRITIN: Ferritin: 235 ng/mL (ref 24–336)

## 2021-10-12 LAB — CBC
HCT: 29.1 % — ABNORMAL LOW (ref 39.0–52.0)
Hemoglobin: 9.1 g/dL — ABNORMAL LOW (ref 13.0–17.0)
MCH: 31 pg (ref 26.0–34.0)
MCHC: 31.3 g/dL (ref 30.0–36.0)
MCV: 99 fL (ref 80.0–100.0)
Platelets: 195 10*3/uL (ref 150–400)
RBC: 2.94 MIL/uL — ABNORMAL LOW (ref 4.22–5.81)
RDW: 15.6 % — ABNORMAL HIGH (ref 11.5–15.5)
WBC: 9.3 10*3/uL (ref 4.0–10.5)
nRBC: 0 % (ref 0.0–0.2)

## 2021-10-12 LAB — FOLATE: Folate: 10.9 ng/mL (ref >5.9)

## 2021-10-12 LAB — RETICULOCYTES
Immature Retic Fract: 18 % — ABNORMAL HIGH (ref 2.3–15.9)
RBC.: 2.9 MIL/uL — ABNORMAL LOW (ref 4.22–5.81)
Retic Count, Absolute: 60.3 10*3/uL (ref 19.0–186.0)
Retic Ct Pct: 2.1 % (ref 0.4–3.1)

## 2021-10-12 LAB — VITAMIN B12: Vitamin B-12: 2107 pg/mL — ABNORMAL HIGH (ref 180–914)

## 2021-10-12 LAB — DAT, POLYSPECIFIC AHG (ARMC ONLY): Polyspecific AHG test: NEGATIVE

## 2021-10-12 MED ORDER — LEVOFLOXACIN 250 MG PO TABS: 250.0000 mg | ORAL_TABLET | Freq: Every day | ORAL | 0 refills | Status: DC

## 2021-10-12 NOTE — Progress Notes (Signed)
New patient evaluation.   

## 2021-10-12 NOTE — Progress Notes (Signed)
[place]  Telephone:([phone]) [phone] [phone]) [phone]  ID: [name] OB: June 21, 1933  MR#: [id number]  JIR#:[id number]  Patient Care Team: [name], MD as PCP - General (Internal Medicine)  CHIEF COMPLAINT: Iron deficiency anemia.  INTERVAL HISTORY: Patient is an 87 year old male who was noted to have persistently decreased hemoglobin and iron stores.  He is sent for further evaluation and consideration of IV iron.  He has chronic weakness and fatigue, but otherwise feels well.  He has no neurologic complaints.  He denies any recent fevers or illnesses.  He has a good appetite and denies weight loss.  He has no chest pain, shortness of breath, cough, or hemoptysis.  He denies any nausea, vomiting, constipation, or diarrhea.  He has no melena or hematochezia.  He has no urinary complaints.  Patient offers no further specific complaints today.  REVIEW OF SYSTEMS:   Review of Systems  Constitutional:  Positive for malaise/fatigue. Negative for fever and weight loss.  Respiratory: Negative.  Negative for cough, hemoptysis and shortness of breath.   Cardiovascular: Negative.  Negative for chest pain and leg swelling.  Gastrointestinal: Negative.  Negative for abdominal pain, blood in stool and melena.  Genitourinary: Negative.  Negative for hematuria.  Musculoskeletal: Negative.  Negative for back pain.  Skin: Negative.  Negative for rash.  Neurological:  Positive for weakness. Negative for dizziness, focal weakness and headaches.  Psychiatric/Behavioral: Negative.  The patient is not nervous/anxious.    As per HPI. Otherwise, a complete review of systems is negative.  PAST MEDICAL HISTORY: Past Medical History:  Diagnosis Date   Basal cell carcinoma 04/14/2021   left medial clavicle EDC 05/31/2021   Basal cell carcinoma 04/14/2021   nose tip    Basal cell carcinoma (BCC) of skin of nose 02/15/2021   CAD (coronary artery disease)    Diverticulitis 2022    HLD (hyperlipidemia)    HTN (hypertension)    LBBB (left bundle branch block)    Myocardial infarction ([place])    Osteoarthritis    Type II diabetes mellitus ([place])     PAST SURGICAL HISTORY: Past Surgical History:  Procedure Laterality Date   CARDIAC CATHETERIZATION     CATARACT EXTRACTION     COLONOSCOPY     COLONOSCOPY WITH PROPOFOL N/A 08/26/2019   Procedure: COLONOSCOPY WITH PROPOFOL;  Surgeon: [name], MD;  Location: [facility] ENDOSCOPY;  Service: Gastroenterology;  Laterality: N/A;   COLONOSCOPY WITH PROPOFOL N/A 09/22/2019   Procedure: COLONOSCOPY WITH PROPOFOL;  Surgeon: [name], MD;  Location: [facility] ENDOSCOPY;  Service: Gastroenterology;  Laterality: N/A;  pediatric colonoscope needed   CORONARY ARTERY BYPASS GRAFT  2012   CYSTOSCOPY WITH FULGERATION N/A 09/06/2021   Procedure: CYSTOSCOPY WITH FULGERATION-Possible Bladder Biospy;  Surgeon: Abbie Sons, MD;  Location: [facility] ORS;  Service: Urology;  Laterality: N/A;   EYE SURGERY     INGUINAL HERNIA REPAIR     JOINT REPLACEMENT     knees   TRANSURETHRAL RESECTION OF BLADDER TUMOR N/A 09/06/2021   Procedure: TRANSURETHRAL RESECTION OF BLADDER TUMOR (TURBT)-Possible;  Surgeon: Abbie Sons, MD;  Location: [facility] ORS;  Service: Urology;  Laterality: N/A;    FAMILY HISTORY: Family History  Problem Relation Age of Onset   Arthritis Mother    Heart disease Mother    Heart disease Other    Lung cancer Son     ADVANCED DIRECTIVES (Y/N):  N  HEALTH MAINTENANCE: Social History   Tobacco Use   Smoking status: Former  Types: Cigarettes    Quit date: 03/03/2008    Years since quitting: 13.6   Smokeless tobacco: Never  Vaping Use   Vaping Use: Never used  Substance Use Topics   Alcohol use: No    Alcohol/week: 0.0 standard drinks   Drug use: No     Colonoscopy:  PAP:  Bone density:  Lipid panel:  No Known Allergies  Current Outpatient Medications  Medication Sig Dispense Refill   Cyanocobalamin 1000 MCG  TBCR Take 1,000 mcg by mouth daily.      finasteride (PROSCAR) 5 MG tablet Take 1 tablet (5 mg total) by mouth daily. 30 tablet 11   levofloxacin (LEVAQUIN) 250 MG tablet Take 1 tablet (250 mg total) by mouth daily. 7 tablet 0   lovastatin (MEVACOR) 40 MG tablet TAKE 1 TABLET BY MOUTH EVERYDAY AT BEDTIME (Patient taking differently: Take 40 mg by mouth at bedtime.) 90 tablet 2   pantoprazole (PROTONIX) 40 MG tablet TAKE 1 TABLET BY MOUTH EVERY DAY 90 tablet 3   tamsulosin (FLOMAX) 0.4 MG CAPS capsule Take 1 capsule (0.4 mg total) by mouth daily. (Patient not taking: Reported on 10/12/2021) 30 capsule 3   No current facility-administered medications for this visit.    OBJECTIVE: Vitals:   10/12/21 0951  BP: (!) 99/55  Pulse: 76  Resp: 16  Temp: 98.3 F (36.8 C)     There is no height or weight on file to calculate BMI.    ECOG FS:1 - Symptomatic but completely ambulatory  General: Well-developed, well-nourished, no acute distress.  Sitting in a wheelchair. Eyes: Pink conjunctiva, anicteric sclera. HEENT: Normocephalic, moist mucous membranes. Lungs: No audible wheezing or coughing. Heart: Regular rate and rhythm. Abdomen: Soft, nontender, no obvious distention. Musculoskeletal: No edema, cyanosis, or clubbing. Neuro: Alert, answering all questions appropriately. Cranial nerves grossly intact. Skin: No rashes or petechiae noted. Psych: Normal affect. Lymphatics: No cervical, calvicular, axillary or inguinal LAD.   LAB RESULTS:  Lab Results  Component Value Date   NA 137 10/11/2021   K 4.8 10/11/2021   CL 102 10/11/2021   CO2 23 10/11/2021   GLUCOSE 187 (H) 10/11/2021   BUN 28 (H) 10/11/2021   CREATININE 1.82 (H) 10/11/2021   CALCIUM 7.9 (L) 10/11/2021   PROT 5.9 (L) 10/11/2021   ALBUMIN 3.7 09/01/2021   AST 18 10/11/2021   ALT 10 10/11/2021   ALKPHOS 111 09/01/2021   BILITOT 0.4 10/11/2021   GFRNONAA 33 (L) 09/10/2021   GFRAA 56 (L) 12/03/2020    Lab Results   Component Value Date   WBC 9.3 10/12/2021   NEUTROABS 7,556 10/11/2021   HGB 9.1 (L) 10/12/2021   HCT 29.1 (L) 10/12/2021   MCV 99.0 10/12/2021   PLT 195 10/12/2021   Lab Results  Component Value Date   IRON 25 (L) 01/02/2021   TIBC 263 01/02/2021   IRONPCTSAT 10 (L) 01/02/2021   Lab Results  Component Value Date   FERRITIN 88 01/02/2021    STUDIES: DG Chest 2 View  Result Date: 10/05/2021 CLINICAL DATA:  Abnormal breath sounds.  Weakness. EXAM: CHEST - 2 VIEW COMPARISON:  X-ray chest 09/09/2021; CT angio chest 01/03/2021. FINDINGS: Trachea is midline. Heart is enlarged, stable. There is new airspace opacification in the right perihilar region and right lung base. Left lung is clear. No pleural fluid. Old right rib fractures. IMPRESSION: Right-sided pneumonia. Followup PA and lateral chest X-ray is recommended in 3-4 weeks following trial of antibiotic therapy to ensure resolution and  exclude underlying malignancy. Electronically Signed   By: Lorin Picket M.D.   On: 10/05/2021 15:54    ASSESSMENT: Iron deficiency anemia.  PLAN:    1.  Iron deficiency anemia: Patient has a reduced hemoglobin of 9.1.  Iron stores, B12, folate, and hemolysis labs are pending at time of dictation.  Patient will likely benefit from IV Venofer and has been scheduled for 5 infusions over the next several weeks.  If his iron stores return normal, these treatments may be canceled or reduced in number.  Patient also has renal insufficiency and may benefit from Retacrit in the near future once his iron stores normalized.  No further intervention is needed at this time.  Return to clinic as above for his IV iron and then in 3 months for repeat laboratory work and further evaluation. 2.  Renal insufficiency: Patient's most recent creatinine is 1.82.  Monitor. 3.  Hypotension: Patient reports he recently discontinued blood pressure medication.  Continue monitoring and evaluation per primary care.   Patient  expressed understanding and was in agreement with this plan. He also understands that He can call clinic at any time with any questions, concerns, or complaints.    Lloyd Huger, MD   10/12/2021 11:53 AM

## 2021-10-13 ENCOUNTER — Telehealth: Payer: Self-pay | Admitting: Urgent Care

## 2021-10-13 LAB — URINE CULTURE
MICRO NUMBER:: 12554403
SPECIMEN QUALITY:: ADEQUATE

## 2021-10-13 LAB — HAPTOGLOBIN: Haptoglobin: 358 mg/dL — ABNORMAL HIGH (ref 38–329)

## 2021-10-13 NOTE — Progress Notes (Signed)
  Perioperative Services Pre-Admission/Anesthesia Testing     Date: 10/13/21  Name: Michael Simon MRN:   [id number]  Re: Request from surgery for clearance prior to scheduled procedure  Patient is scheduled to undergo a CYSTOSCOPY WITH INSERTION OF UROLIFT on 11/04/2021 with Dr. [name], MD. Patient has not been scheduled for his PAT appointment at this point, thus has not undergone review by PAT RN and/or APP. Received communication from primary attending surgeon's office regarding upcoming surgery. In brief review of this patient's PMH, it was determined that the patient will need to have presurgical clearance from his primary attending cardiologist prior to proceeding.    PROVIDER SPECIALTY FAXED TO   [name], MD Cardiology  [phone]   Plan:  Clearance documents generated and faxed to appropriate provider(s) as noted above. Note will be updated to reflect communication with provider's office as it relates to clearance being provided and/or the need for office visit prior to clearance for surgery being issued.   Honor Loh, MSN, APRN, FNP-C, CEN [facility]  Peri-operative Services Nurse Practitioner Phone: [phone] 10/13/21 9:27 AM  NOTE: This note has been prepared using Dragon dictation software. Despite my best ability to proofread, there is always the potential that unintentional transcriptional errors may still occur from this process.

## 2021-10-13 NOTE — Progress Notes (Signed)
[facility] Posting Form   Surgery Date/Time: Date: 11/04/2021  Surgeon: Dr. [name], MD  Surgery Location: Day Surgery  Inpt ( No  )   Outpt (Yes)   Obs ( No  )   Diagnosis: N40.1, N13.8 Benign Prostatic Hyperplasia with Bladder Outlet Obstruction   -CPT: [id number]  Surgery: Cystoscopy with Urolift Insertion  Stop Anticoagulations: Yes  Cardiac/Medical/Pulmonary Clearance needed: Yes  Clearance needed from Dr: [name]  Clearance request sent on: Date: 10/13/21; Initiated by Honor Loh, NP with Anesthesia/Pre-Admit  *Orders entered into EPIC  Date: 10/13/21   *Case booked in EPIC  Date: 10/12/2021  *Notified pt of Surgery: Date: 10/13/21  PRE-OP UA & CX: Yes, Orders entered  *Placed into Prior Authorization Work Que Date: 10/13/21   Assistant/laser/rep:No

## 2021-10-14 ENCOUNTER — Inpatient Hospital Stay: Payer: Medicare HMO

## 2021-10-14 ENCOUNTER — Other Ambulatory Visit: Payer: Self-pay

## 2021-10-14 VITALS — BP 102/45 | HR 58 | Temp 98.0°F | Resp 20

## 2021-10-14 DIAGNOSIS — I959 Hypotension, unspecified: Secondary | ICD-10-CM | POA: Diagnosis not present

## 2021-10-14 DIAGNOSIS — N289 Disorder of kidney and ureter, unspecified: Secondary | ICD-10-CM | POA: Diagnosis not present

## 2021-10-14 DIAGNOSIS — D509 Iron deficiency anemia, unspecified: Secondary | ICD-10-CM

## 2021-10-14 DIAGNOSIS — Z87891 Personal history of nicotine dependence: Secondary | ICD-10-CM | POA: Diagnosis not present

## 2021-10-14 MED ORDER — SODIUM CHLORIDE 0.9 % IV SOLN
Freq: Once | INTRAVENOUS | Status: AC
Start: 2021-10-14 — End: 2021-10-14
  Filled 2021-10-14: qty 250

## 2021-10-14 MED ORDER — SODIUM CHLORIDE 0.9 % IV SOLN: 200.0000 mg | Freq: Once | INTRAVENOUS | Status: DC

## 2021-10-14 MED ORDER — IRON SUCROSE 20 MG/ML IV SOLN
200.0000 mg | Freq: Once | INTRAVENOUS | Status: AC
  Administered 2021-10-14: 200 mg via INTRAVENOUS
  Filled 2021-10-14: qty 10

## 2021-10-14 NOTE — Assessment & Plan Note (Signed)
Blood sugar is under control ?

## 2021-10-14 NOTE — Assessment & Plan Note (Signed)
Blood pressure is stable on today's examination

## 2021-10-14 NOTE — Assessment & Plan Note (Signed)
Chest x-ray revealed partial clearing of the right middle lobe infiltrate we will continue antibiotic another 5 days

## 2021-10-14 NOTE — Assessment & Plan Note (Signed)
Continue follow up with hematologist 

## 2021-10-14 NOTE — Progress Notes (Signed)
Established Patient Office Visit  Subjective:  Patient ID: Michael Simon, male    DOB: 11/21/1933  Age: 87 y.o. MRN: [id number]  CC: No chief complaint on file.   HPI  Michael Simon presents for for pneumonia and weakness.  Patient was able to walk today to my office, he does not feel weaker, wheezing or decreased Past Medical History:  Diagnosis Date   Basal cell carcinoma 04/14/2021   left medial clavicle EDC 05/31/2021   Basal cell carcinoma 04/14/2021   nose tip    Basal cell carcinoma (BCC) of skin of nose 02/15/2021   CAD (coronary artery disease)    Diverticulitis 2022   HLD (hyperlipidemia)    HTN (hypertension)    LBBB (left bundle branch block)    Myocardial infarction ([place])    Osteoarthritis    Type II diabetes mellitus ([place])     Past Surgical History:  Procedure Laterality Date   CARDIAC CATHETERIZATION     CATARACT EXTRACTION     COLONOSCOPY     COLONOSCOPY WITH PROPOFOL N/A 08/26/2019   Procedure: COLONOSCOPY WITH PROPOFOL;  Surgeon: [name], MD;  Location: [facility] ENDOSCOPY;  Service: Gastroenterology;  Laterality: N/A;   COLONOSCOPY WITH PROPOFOL N/A 09/22/2019   Procedure: COLONOSCOPY WITH PROPOFOL;  Surgeon: [name], MD;  Location: [facility] ENDOSCOPY;  Service: Gastroenterology;  Laterality: N/A;  pediatric colonoscope needed   CORONARY ARTERY BYPASS GRAFT  2012   CYSTOSCOPY WITH FULGERATION N/A 09/06/2021   Procedure: CYSTOSCOPY WITH FULGERATION-Possible Bladder Biospy;  Surgeon: Abbie Sons, MD;  Location: [facility] ORS;  Service: Urology;  Laterality: N/A;   EYE SURGERY     INGUINAL HERNIA REPAIR     JOINT REPLACEMENT     knees   TRANSURETHRAL RESECTION OF BLADDER TUMOR N/A 09/06/2021   Procedure: TRANSURETHRAL RESECTION OF BLADDER TUMOR (TURBT)-Possible;  Surgeon: Abbie Sons, MD;  Location: [facility] ORS;  Service: Urology;  Laterality: N/A;    Family History  Problem Relation Age of Onset   Arthritis Mother    Heart disease Mother     Heart disease Other    Lung cancer Son     Social History   Socioeconomic History   Marital status: Widowed    Spouse name: Not on file   Number of children: Not on file   Years of education: Not on file   Highest education level: Not on file  Occupational History   Not on file  Tobacco Use   Smoking status: Former    Types: Cigarettes    Quit date: 03/03/2008    Years since quitting: 13.6   Smokeless tobacco: Never  Vaping Use   Vaping Use: Never used  Substance and Sexual Activity   Alcohol use: No    Alcohol/week: 0.0 standard drinks   Drug use: No   Sexual activity: Not on file  Other Topics Concern   Not on file  Social History Narrative   Not on file   Social Determinants of Health   Financial Resource Strain: Not on file  Food Insecurity: Not on file  Transportation Needs: Not on file  Physical Activity: Not on file  Stress: Not on file  Social Connections: Not on file  Intimate Partner Violence: Not on file     Current Outpatient Medications:    Cyanocobalamin 1000 MCG TBCR, Take 1,000 mcg by mouth daily. , Disp: , Rfl:    finasteride (PROSCAR) 5 MG tablet, Take 1 tablet (5 mg total) by mouth daily.,  Disp: 30 tablet, Rfl: 11   levofloxacin (LEVAQUIN) 250 MG tablet, Take 1 tablet (250 mg total) by mouth daily., Disp: 7 tablet, Rfl: 0   lovastatin (MEVACOR) 40 MG tablet, TAKE 1 TABLET BY MOUTH EVERYDAY AT BEDTIME (Patient taking differently: Take 40 mg by mouth at bedtime.), Disp: 90 tablet, Rfl: 2   pantoprazole (PROTONIX) 40 MG tablet, TAKE 1 TABLET BY MOUTH EVERY DAY, Disp: 90 tablet, Rfl: 3   tamsulosin (FLOMAX) 0.4 MG CAPS capsule, Take 1 capsule (0.4 mg total) by mouth daily. (Patient not taking: Reported on 10/12/2021), Disp: 30 capsule, Rfl: 3   No Known Allergies  ROS Review of Systems  Constitutional:  Positive for fatigue. Negative for chills, diaphoresis and fever.  HENT: Negative.    Eyes: Negative.   Respiratory:  Positive for cough and  shortness of breath. Negative for wheezing.   Cardiovascular: Negative.  Negative for chest pain.  Gastrointestinal: Negative.   Endocrine: Negative.   Genitourinary: Negative.   Musculoskeletal: Negative.   Skin: Negative.   Allergic/Immunologic: Negative.   Neurological: Negative.   Hematological: Negative.   Psychiatric/Behavioral: Negative.    All other systems reviewed and are negative.    Objective:    Physical Exam Vitals reviewed.  Constitutional:      Appearance: Normal appearance. He is not ill-appearing.  HENT:     Mouth/Throat:     Mouth: Mucous membranes are moist.  Eyes:     Pupils: Pupils are equal, round, and reactive to light.  Neck:     Vascular: No carotid bruit.  Cardiovascular:     Rate and Rhythm: Normal rate and regular rhythm.     Pulses: Normal pulses.     Heart sounds: Normal heart sounds.  Pulmonary:     Effort: Pulmonary effort is normal.     Breath sounds: Normal breath sounds.  Abdominal:     General: Bowel sounds are normal.     Palpations: Abdomen is soft. There is no hepatomegaly, splenomegaly or mass.     Tenderness: There is no abdominal tenderness.     Hernia: No hernia is present.  Musculoskeletal:     Cervical back: Neck supple.     Right lower leg: No edema.     Left lower leg: No edema.  Skin:    Findings: No rash.  Neurological:     Mental Status: He is alert and oriented to person, place, and time.     Motor: No weakness.  Psychiatric:        Mood and Affect: Mood normal.        Behavior: Behavior normal.    BP 109/62   Pulse 79   Ht _0  (1.727 m)   Wt 173 lb 1.6 oz (78.5 kg)   BMI 26.32 kg/m  Wt Readings from Last 3 Encounters:  10/12/21 173 lb 1.6 oz (78.5 kg)  09/29/21 180 lb (81.6 kg)  09/06/21 172 lb (78 kg)     Health Maintenance Due  Topic Date Due   FOOT EXAM  Never done   OPHTHALMOLOGY EXAM  Never done   URINE MICROALBUMIN  Never done   TETANUS/TDAP  Never done   Zoster Vaccines- Shingrix (1  of 2) Never done   Pneumonia Vaccine 40+ Years old (2 - PCV) 03/10/2012   COVID-19 Vaccine (3 - Pfizer risk series) 04/08/2020   IMPRESSION: 1. Improved right perihilar and right basilar airspace opacities, suggestive of improving pneumonia. Recommend continued imaging follow-up to resolution. 2. Cardiomegaly. 3.  Similar mild diffuse interstitial thickening,   Lab Results  Component Value Date   TSH 3.698 01/02/2021   Lab Results  Component Value Date   WBC 9.3 10/12/2021   HGB 9.1 (L) 10/12/2021   HCT 29.1 (L) 10/12/2021   MCV 99.0 10/12/2021   PLT 195 10/12/2021   Lab Results  Component Value Date   NA 137 10/11/2021   K 4.8 10/11/2021   CO2 23 10/11/2021   GLUCOSE 187 (H) 10/11/2021   BUN 28 (H) 10/11/2021   CREATININE 1.82 (H) 10/11/2021   BILITOT 0.4 10/11/2021   ALKPHOS 111 09/01/2021   AST 18 10/11/2021   ALT 10 10/11/2021   PROT 5.9 (L) 10/11/2021   ALBUMIN 3.7 09/01/2021   CALCIUM 7.9 (L) 10/11/2021   ANIONGAP 7 09/10/2021   EGFR 36 (L) 10/11/2021   No results found for: CHOL No results found for: HDL No results found for: LDLCALC No results found for: TRIG No results found for: CHOLHDL Lab Results  Component Value Date   HGBA1C 7.6 (H) 09/01/2021      Assessment & Plan:   Problem List Items Addressed This Visit       Cardiovascular and Mediastinum   Essential hypertension    Blood pressure is stable on today's examination      Coronary artery disease involving native heart without angina pectoris    Patient denies any chest pain chest decreased breath sounds without any rhonchi        Respiratory   Hypoxia    Resolved        Endocrine   Diabetes mellitus with complication (HCC)    Blood sugar is under control        Other   Generalized weakness    Chest x-ray revealed partial clearing of the right middle lobe infiltrate we will continue antibiotic another 5 days      Acute blood loss anemia    Continue follow-up with  hematologist      Other Visit Diagnoses     Preop examination    -  Primary   Relevant Orders   Urine Culture (Completed)       Meds ordered this encounter  Medications   levofloxacin (LEVAQUIN) 250 MG tablet    Sig: Take 1 tablet (250 mg total) by mouth daily.    Dispense:  7 tablet    Refill:  0    Follow-up: No follow-ups on file.    [name], MD

## 2021-10-14 NOTE — Patient Instructions (Signed)
Also download the MyChart app! Go to the app store, search "MyChart", open the app, select [facility], and log in with your MyChart username and password. Iron Sucrose Injection What is this medication? IRON SUCROSE (EYE ern SOO krose) treats low levels of iron (iron deficiency anemia) in people with kidney disease. Iron is a mineral that plays an important role in making red blood cells, which carry oxygen from your lungs to the rest of your body. This medicine may be used for other purposes; ask your health care provider or pharmacist if you have questions. COMMON BRAND NAME(S): Venofer What should I tell my care team before I take this medication? They need to know if you have any of these conditions: Anemia not caused by low iron levels Heart disease High levels of iron in the blood Kidney disease Liver disease An unusual or allergic reaction to iron, other medications, foods, dyes, or preservatives Pregnant or trying to get pregnant Breast-feeding How should I use this medication? This medication is for infusion into a vein. It is given in a hospital or clinic setting. Talk to your care team about the use of this medication in children. While this medication may be prescribed for children as young as 2 years for selected conditions, precautions do apply. Overdosage: If you think you have taken too much of this medicine contact a poison control center or emergency room at once. NOTE: This medicine is only for you. Do not share this medicine with others. What if I miss a dose? It is important not to miss your dose. Call your care team if you are unable to keep an appointment. What may interact with this medication? Do not take this medication with any of the following: Deferoxamine Dimercaprol Other iron products This medication may also interact with the following: Chloramphenicol Deferasirox This list may not describe all possible interactions. Give your health care provider  a list of all the medicines, herbs, non-prescription drugs, or dietary supplements you use. Also tell them if you smoke, drink alcohol, or use illegal drugs. Some items may interact with your medicine. What should I watch for while using this medication? Visit your care team regularly. Tell your care team if your symptoms do not start to get better or if they get worse. You may need blood work done while you are taking this medication. You may need to follow a special diet. Talk to your care team. Foods that contain iron include: whole grains/cereals, dried fruits, beans, or peas, leafy green vegetables, and organ meats (liver, kidney). What side effects may I notice from receiving this medication? Side effects that you should report to your care team as soon as possible: Allergic reactions-skin rash, itching, hives, swelling of the face, lips, tongue, or throat Low blood pressure-dizziness, feeling faint or lightheaded, blurry vision Shortness of breath Side effects that usually do not require medical attention (report to your care team if they continue or are bothersome): Flushing Headache Joint pain Muscle pain Nausea Pain, redness, or irritation at injection site This list may not describe all possible side effects. Call your doctor for medical advice about side effects. You may report side effects to FDA at 1-800-FDA-1088. Where should I keep my medication? This medication is given in a hospital or clinic and will not be stored at home. NOTE: This sheet is a summary. It may not cover all possible information. If you have questions about this medicine, talk to your doctor, pharmacist, or health care provider.  2022 Elsevier/Gold Standard (2021-03-01 12:52:06)

## 2021-10-14 NOTE — Assessment & Plan Note (Signed)
Patient denies any chest pain chest decreased breath sounds without any rhonchi

## 2021-10-14 NOTE — Assessment & Plan Note (Signed)
Resolved

## 2021-10-17 ENCOUNTER — Inpatient Hospital Stay: Payer: Medicare HMO

## 2021-10-17 ENCOUNTER — Emergency Department
Admission: EM | Admit: 2021-10-17 | Discharge: 2021-10-17 | Disposition: A | Discharge status: Home / Self Care | Payer: Medicare HMO | Attending: Emergency Medicine | Admitting: Emergency Medicine

## 2021-10-17 ENCOUNTER — Other Ambulatory Visit: Payer: Self-pay

## 2021-10-17 VITALS — BP 101/70 | HR 75 | Temp 96.8°F | Resp 20

## 2021-10-17 DIAGNOSIS — R531 Weakness: Secondary | ICD-10-CM | POA: Diagnosis present

## 2021-10-17 DIAGNOSIS — R0902 Hypoxemia: Secondary | ICD-10-CM | POA: Diagnosis not present

## 2021-10-17 DIAGNOSIS — N289 Disorder of kidney and ureter, unspecified: Secondary | ICD-10-CM | POA: Diagnosis not present

## 2021-10-17 DIAGNOSIS — Z20822 Contact with and (suspected) exposure to covid-19: Secondary | ICD-10-CM | POA: Diagnosis not present

## 2021-10-17 DIAGNOSIS — Z5321 Procedure and treatment not carried out due to patient leaving prior to being seen by health care provider: Secondary | ICD-10-CM | POA: Insufficient documentation

## 2021-10-17 DIAGNOSIS — Z87891 Personal history of nicotine dependence: Secondary | ICD-10-CM | POA: Diagnosis not present

## 2021-10-17 DIAGNOSIS — D509 Iron deficiency anemia, unspecified: Secondary | ICD-10-CM

## 2021-10-17 DIAGNOSIS — R5381 Other malaise: Secondary | ICD-10-CM | POA: Diagnosis not present

## 2021-10-17 DIAGNOSIS — I959 Hypotension, unspecified: Secondary | ICD-10-CM | POA: Diagnosis not present

## 2021-10-17 LAB — CBC
HCT: 31 % — ABNORMAL LOW (ref 39.0–52.0)
Hemoglobin: 10.3 g/dL — ABNORMAL LOW (ref 13.0–17.0)
MCH: 32.4 pg (ref 26.0–34.0)
MCHC: 33.2 g/dL (ref 30.0–36.0)
MCV: 97.5 fL (ref 80.0–100.0)
Platelets: 185 10*3/uL (ref 150–400)
RBC: 3.18 MIL/uL — ABNORMAL LOW (ref 4.22–5.81)
RDW: 15.3 % (ref 11.5–15.5)
WBC: 9.2 10*3/uL (ref 4.0–10.5)
nRBC: 0 % (ref 0.0–0.2)

## 2021-10-17 LAB — BASIC METABOLIC PANEL
Anion gap: 11 (ref 5–15)
BUN: 27 mg/dL — ABNORMAL HIGH (ref 8–23)
CO2: 24 mmol/L (ref 22–32)
Calcium: 8.5 mg/dL — ABNORMAL LOW (ref 8.9–10.3)
Chloride: 101 mmol/L (ref 98–111)
Creatinine, Ser: 1.74 mg/dL — ABNORMAL HIGH (ref 0.61–1.24)
GFR, Estimated: 38 mL/min — ABNORMAL LOW (ref >60)
Glucose, Bld: 114 mg/dL — ABNORMAL HIGH (ref 70–99)
Potassium: 4.2 mmol/L (ref 3.5–5.1)
Sodium: 136 mmol/L (ref 135–145)

## 2021-10-17 LAB — RESP PANEL BY RT-PCR (FLU A&B, COVID) ARPGX2
Influenza A by PCR: NEGATIVE
Influenza B by PCR: NEGATIVE
SARS Coronavirus 2 by RT PCR: NEGATIVE

## 2021-10-17 LAB — TROPONIN I (HIGH SENSITIVITY): Troponin I (High Sensitivity): 16 ng/L (ref <18)

## 2021-10-17 MED ORDER — SODIUM CHLORIDE 0.9 % IV SOLN
Freq: Once | INTRAVENOUS | Status: AC
  Filled 2021-10-17: qty 250

## 2021-10-17 MED ORDER — SODIUM CHLORIDE 0.9 % IV SOLN: 200.0000 mg | Freq: Once | INTRAVENOUS | Status: DC

## 2021-10-17 MED ORDER — IRON SUCROSE 20 MG/ML IV SOLN
200.0000 mg | Freq: Once | INTRAVENOUS | Status: AC
  Administered 2021-10-17: 200 mg via INTRAVENOUS
  Filled 2021-10-17: qty 10

## 2021-10-17 NOTE — ED Triage Notes (Signed)
Pt assisted to restroom via w/c for BM

## 2021-10-17 NOTE — ED Triage Notes (Signed)
Pt to ED [facility] from home for weakness for several days. Son reports weakness increased when he could not get out of bed this am d/t weakness. Pt able to move all extremities when assisted to toilet this morning.  Also c/o right leg pain for the past few days, no redness or swelling noted.   Recent infusion for anemia at cancer center.  Alert and oriented

## 2021-10-17 NOTE — Patient Instructions (Signed)

## 2021-10-17 NOTE — ED Triage Notes (Signed)
First Nurse Note:  Arrives from home via [facility].  Family c/o general decline over past week.  VS wnl.  Family reports cloudy urine.

## 2021-10-18 ENCOUNTER — Other Ambulatory Visit: Payer: Self-pay | Admitting: Cardiology

## 2021-10-18 ENCOUNTER — Encounter: Payer: Self-pay | Admitting: Urology

## 2021-10-18 ENCOUNTER — Ambulatory Visit
Admission: RE | Admit: 2021-10-18 | Discharge: 2021-10-18 | Disposition: A | Discharge status: Home / Self Care | Payer: Medicare HMO | Source: Ambulatory Visit | Attending: Cardiology | Admitting: Cardiology

## 2021-10-18 DIAGNOSIS — I483 Typical atrial flutter: Secondary | ICD-10-CM | POA: Diagnosis not present

## 2021-10-18 DIAGNOSIS — M79604 Pain in right leg: Secondary | ICD-10-CM | POA: Diagnosis not present

## 2021-10-18 DIAGNOSIS — M7989 Other specified soft tissue disorders: Secondary | ICD-10-CM | POA: Diagnosis present

## 2021-10-18 DIAGNOSIS — N179 Acute kidney failure, unspecified: Secondary | ICD-10-CM | POA: Diagnosis not present

## 2021-10-18 DIAGNOSIS — E119 Type 2 diabetes mellitus without complications: Secondary | ICD-10-CM | POA: Diagnosis not present

## 2021-10-18 DIAGNOSIS — M79661 Pain in right lower leg: Secondary | ICD-10-CM | POA: Insufficient documentation

## 2021-10-18 DIAGNOSIS — E7800 Pure hypercholesterolemia, unspecified: Secondary | ICD-10-CM | POA: Diagnosis not present

## 2021-10-18 DIAGNOSIS — I2510 Atherosclerotic heart disease of native coronary artery without angina pectoris: Secondary | ICD-10-CM | POA: Diagnosis not present

## 2021-10-18 DIAGNOSIS — I447 Left bundle-branch block, unspecified: Secondary | ICD-10-CM | POA: Diagnosis not present

## 2021-10-18 DIAGNOSIS — E1129 Type 2 diabetes mellitus with other diabetic kidney complication: Secondary | ICD-10-CM | POA: Diagnosis not present

## 2021-10-18 DIAGNOSIS — I10 Essential (primary) hypertension: Secondary | ICD-10-CM | POA: Diagnosis not present

## 2021-10-19 ENCOUNTER — Telehealth: Payer: Self-pay | Admitting: Urgent Care

## 2021-10-19 ENCOUNTER — Encounter: Payer: Self-pay | Admitting: Urgent Care

## 2021-10-19 ENCOUNTER — Ambulatory Visit (INDEPENDENT_AMBULATORY_CARE_PROVIDER_SITE_OTHER): Payer: Medicare HMO | Admitting: Internal Medicine

## 2021-10-19 ENCOUNTER — Encounter: Payer: Self-pay | Admitting: Internal Medicine

## 2021-10-19 ENCOUNTER — Other Ambulatory Visit: Payer: Self-pay

## 2021-10-19 VITALS — BP 120/70 | HR 112 | Ht 66.0 in | Wt 170.0 lb

## 2021-10-19 DIAGNOSIS — D509 Iron deficiency anemia, unspecified: Secondary | ICD-10-CM

## 2021-10-19 DIAGNOSIS — E118 Type 2 diabetes mellitus with unspecified complications: Secondary | ICD-10-CM

## 2021-10-19 DIAGNOSIS — I483 Typical atrial flutter: Secondary | ICD-10-CM

## 2021-10-19 DIAGNOSIS — R310 Gross hematuria: Secondary | ICD-10-CM

## 2021-10-19 DIAGNOSIS — R531 Weakness: Secondary | ICD-10-CM

## 2021-10-19 DIAGNOSIS — I10 Essential (primary) hypertension: Secondary | ICD-10-CM | POA: Diagnosis not present

## 2021-10-19 NOTE — Assessment & Plan Note (Signed)

## 2021-10-19 NOTE — Progress Notes (Signed)
Established Patient Office Visit  Subjective:  Patient ID: Michael Simon, male    DOB: 02-06-1933  Age: 87 y.o. MRN: [id number]  CC:  Chief Complaint  Patient presents with   Follow-up    7 day follow up     HPI  Michael Simon presents for a general checkup.  Michael Simon is weak in the both legs and could not walk and help to come back to the office in a wheelchair, Michael Simon denies any chest pain or shortness of breath but feels weak.  Michael Simon has been seen in the emergency room last week.  Lab tests were normal, urine culture was unremarkable flu test was negative, electrocardiogram revealed atrial flutter.  Past Medical History:  Diagnosis Date   Atrial fibrillation and flutter ([place])    a.) CHA2DS2-VASc = 5 (age x 2, HTN, prior MI, T1DM). b.) no medications for rate/rhythm; no longer on daily anticoagulation.   Basal cell carcinoma 04/14/2021   left medial clavicle EDC 05/31/2021   Basal cell carcinoma 04/14/2021   nose tip    Basal cell carcinoma (BCC) of skin of nose 02/15/2021   CAD (coronary artery disease)    a.) s/p CABG in 2012   COPD (chronic obstructive pulmonary disease) ([place])    Diverticulitis 2022   HLD (hyperlipidemia)    HTN (hypertension)    Hx of CABG 03/07/2011   IDA (iron deficiency anemia)    LBBB (left bundle branch block)    Myocardial infarction ([place])    Osteoarthritis    Type II diabetes mellitus ([place])     Past Surgical History:  Procedure Laterality Date   CARDIAC CATHETERIZATION     CATARACT EXTRACTION     COLONOSCOPY     COLONOSCOPY WITH PROPOFOL N/A 08/26/2019   Procedure: COLONOSCOPY WITH PROPOFOL;  Surgeon: [name], MD;  Location: [facility] ENDOSCOPY;  Service: Gastroenterology;  Laterality: N/A;   COLONOSCOPY WITH PROPOFOL N/A 09/22/2019   Procedure: COLONOSCOPY WITH PROPOFOL;  Surgeon: [name], MD;  Location: [facility] ENDOSCOPY;  Service: Gastroenterology;  Laterality: N/A;  pediatric colonoscope needed   CORONARY ARTERY BYPASS GRAFT  2012    CYSTOSCOPY WITH FULGERATION N/A 09/06/2021   Procedure: CYSTOSCOPY WITH FULGERATION-Possible Bladder Biospy;  Surgeon: Abbie Sons, MD;  Location: [facility] ORS;  Service: Urology;  Laterality: N/A;   EYE SURGERY     INGUINAL HERNIA REPAIR     JOINT REPLACEMENT     knees   TRANSURETHRAL RESECTION OF BLADDER TUMOR N/A 09/06/2021   Procedure: TRANSURETHRAL RESECTION OF BLADDER TUMOR (TURBT)-Possible;  Surgeon: Abbie Sons, MD;  Location: [facility] ORS;  Service: Urology;  Laterality: N/A;    Family History  Problem Relation Age of Onset   Arthritis Mother    Heart disease Mother    Heart disease Other    Lung cancer Son     Social History   Socioeconomic History   Marital status: Widowed    Spouse name: Not on file   Number of children: Not on file   Years of education: Not on file   Highest education level: Not on file  Occupational History   Not on file  Tobacco Use   Smoking status: Former    Types: Cigarettes    Quit date: 03/03/2008    Years since quitting: 13.6   Smokeless tobacco: Never  Vaping Use   Vaping Use: Never used  Substance and Sexual Activity   Alcohol use: No    Alcohol/week: 0.0 standard drinks  Drug use: No   Sexual activity: Not on file  Other Topics Concern   Not on file  Social History Narrative   Not on file   Social Determinants of Health   Financial Resource Strain: Not on file  Food Insecurity: Not on file  Transportation Needs: Not on file  Physical Activity: Not on file  Stress: Not on file  Social Connections: Not on file  Intimate Partner Violence: Not on file     Current Outpatient Medications:    Cyanocobalamin 1000 MCG TBCR, Take 1,000 mcg by mouth daily. , Disp: , Rfl:    finasteride (PROSCAR) 5 MG tablet, Take 1 tablet (5 mg total) by mouth daily., Disp: 30 tablet, Rfl: 11   lovastatin (MEVACOR) 40 MG tablet, TAKE 1 TABLET BY MOUTH EVERYDAY AT BEDTIME (Patient taking differently: Take 40 mg by mouth at bedtime.), Disp: 90  tablet, Rfl: 2   pantoprazole (PROTONIX) 40 MG tablet, TAKE 1 TABLET BY MOUTH EVERY DAY, Disp: 90 tablet, Rfl: 3   tamsulosin (FLOMAX) 0.4 MG CAPS capsule, Take 1 capsule (0.4 mg total) by mouth daily., Disp: 30 capsule, Rfl: 3   No Known Allergies  ROS Review of Systems  Constitutional:  Positive for fatigue. Negative for chills.  Respiratory:  Negative for cough, chest tightness and wheezing.   Cardiovascular:  Negative for chest pain.  Gastrointestinal:  Negative for abdominal distention.  Endocrine: Negative.   Genitourinary: Negative.   Musculoskeletal:  Positive for gait problem.  Skin: Negative.   Allergic/Immunologic: Negative.   Neurological:  Positive for weakness and light-headedness.  Hematological: Negative.   Psychiatric/Behavioral: Negative.  Negative for agitation, behavioral problems and confusion.   All other systems reviewed and are negative.    Objective:    Physical Exam Vitals reviewed.  Constitutional:      Appearance: Normal appearance.  HENT:     Mouth/Throat:     Mouth: Mucous membranes are moist.  Eyes:     Pupils: Pupils are equal, round, and reactive to light.  Neck:     Vascular: No carotid bruit.  Cardiovascular:     Rate and Rhythm: Normal rate.     Heart sounds: Normal heart sounds. No murmur heard.   No friction rub.  Pulmonary:     Effort: Pulmonary effort is normal.     Breath sounds: Normal breath sounds.  Abdominal:     General: Bowel sounds are normal.     Palpations: Abdomen is soft. There is no hepatomegaly, splenomegaly or mass.     Tenderness: There is no abdominal tenderness.     Hernia: No hernia is present.  Musculoskeletal:     Cervical back: Neck supple.     Right lower leg: No edema.     Left lower leg: No edema.     Comments: Patient is awake in the both legs could not walk came in the wheelchair  Skin:    Findings: No rash.  Neurological:     Mental Status: Michael Simon is alert and oriented to person, place, and time.      Motor: No weakness.     Gait: Gait abnormal.     Comments: Motor power decreased more in the legs and upper EXTR.  Sensations are intact  Psychiatric:        Mood and Affect: Mood normal.        Behavior: Behavior normal.    BP 120/70   Pulse (!) 112   Ht 5\' 6"  (1.676 m)  Wt 170 lb (77.1 kg)   BMI 27.44 kg/m  Wt Readings from Last 3 Encounters:  10/19/21 170 lb (77.1 kg)  10/17/21 170 lb (77.1 kg)  10/12/21 173 lb 1.6 oz (78.5 kg)     Health Maintenance Due  Topic Date Due   FOOT EXAM  Never done   OPHTHALMOLOGY EXAM  Never done   URINE MICROALBUMIN  Never done   TETANUS/TDAP  Never done   Zoster Vaccines- Shingrix (1 of 2) Never done   Pneumonia Vaccine 8+ Years old (2 - PCV) 03/10/2012   COVID-19 Vaccine (3 - Pfizer risk series) 04/08/2020    There are no preventive care reminders to display for this patient.  Lab Results  Component Value Date   TSH 3.698 01/02/2021   Lab Results  Component Value Date   WBC 9.2 10/17/2021   HGB 10.3 (L) 10/17/2021   HCT 31.0 (L) 10/17/2021   MCV 97.5 10/17/2021   PLT 185 10/17/2021   Lab Results  Component Value Date   NA 136 10/17/2021   K 4.2 10/17/2021   CO2 24 10/17/2021   GLUCOSE 114 (H) 10/17/2021   BUN 27 (H) 10/17/2021   CREATININE 1.74 (H) 10/17/2021   BILITOT 0.4 10/11/2021   ALKPHOS 111 09/01/2021   AST 18 10/11/2021   ALT 10 10/11/2021   PROT 5.9 (L) 10/11/2021   ALBUMIN 3.7 09/01/2021   CALCIUM 8.5 (L) 10/17/2021   ANIONGAP 11 10/17/2021   EGFR 36 (L) 10/11/2021   No results found for: CHOL No results found for: HDL No results found for: LDLCALC No results found for: TRIG No results found for: CHOLHDL Lab Results  Component Value Date   HGBA1C 7.6 (H) 09/01/2021      Assessment & Plan:   Problem List Items Addressed This Visit       Cardiovascular and Mediastinum   Essential hypertension - Primary     Blood pressure is low      Atrial flutter (HCC)    Electrocardiogram  revealed atrial flutter with controlled ventricular response        Endocrine   Diabetes mellitus with complication ([place])    - The patient's blood sugar is labile on med. - The patient will continue the current treatment regimen.  - I encouraged the patient to regularly check blood sugar.  - I encouraged the patient to monitor diet. I encouraged the patient to eat low-carb and low-sugar to help prevent blood sugar spikes.  - I encouraged the patient to continue following their prescribed treatment plan for diabetes - I informed the patient to get help if blood sugar drops below $RemoveBef'54mg'[id number]$ /dL, or if suddenly have trouble thinking clearly or breathing.  Patient was advised to buy a book on diabetes from a local bookstore or from [place]  Patient should read 2 chapters every day to keep the motivation going, this is in addition to some of the materials we provided them from the office.  There are other resources on the Internet like YouTube and wilkipedia to get an education on the diabetes        Genitourinary   Hematuria, gross    Patient does not have any hematuria anymore        Other   Weakness    Patient is weak in the both legs cannot walk      Relevant Orders   EKG 12-Lead   Ambulatory referral to Home Health   Iron deficiency anemia  on IV iron therapy     Report of the electrocardiogram. Electrocardiogram revealed atrial flutter nonspecific ST-T abnormalities, no acute changes are noted.    Patient has weakness of the both legs, electrocardiogram was repeated.  Shows atrial flutter.  No acute changes are noted.  Chest decreased breath sound heart is regular abdomen is soft nontender.  There is no hematuria on the urine examination. Patient weakness could be due to antibiotic use, I will check him back again next week, Michael Simon is not a candidate for Eliquis.  Urological surgery may have to be postponed if Michael Simon remains weak like this.  We will start him on physical  therapy Follow-up: No follow-ups on file.    [name], MD

## 2021-10-19 NOTE — Assessment & Plan Note (Addendum)
  Blood pressure is low

## 2021-10-19 NOTE — Assessment & Plan Note (Addendum)
on IV iron therapy

## 2021-10-19 NOTE — Assessment & Plan Note (Signed)
Electrocardiogram revealed atrial flutter with controlled ventricular response

## 2021-10-19 NOTE — Assessment & Plan Note (Addendum)
Patient is weak in the both legs cannot walk

## 2021-10-19 NOTE — Assessment & Plan Note (Signed)
Patient does not have any hematuria anymore

## 2021-10-19 NOTE — Progress Notes (Addendum)
  Perioperative Services Pre-Admission/Anesthesia Testing     Date: 10/19/21  Name: Michael Simon MRN:   [id number]  Re: Surgical clearance  Surgical clearance received from Dr. Mel Almond office (cardiology). Patient has been cleared for the planned CYSTOSCOPY WITH INSERTION OF UROLIFT procedure scheduled for 11/04/2021 with Dr. [name], MD.  Cardiology notes that patient may proceed with an overall MODERATE risk stratification. He is on not anticoagulation/antiplatelet therapies that would need to be help perioperatively. Copy of signed clearance form placed on patient's OR chart for review by the surgical/anesthetic team on the day of his procedure.   Honor Loh, MSN, APRN, FNP-C, CEN [facility]  Peri-operative Services Nurse Practitioner Phone: [phone] 10/19/21 2:59 PM

## 2021-10-21 ENCOUNTER — Other Ambulatory Visit: Payer: Self-pay

## 2021-10-21 ENCOUNTER — Inpatient Hospital Stay: Payer: Medicare HMO | Attending: Oncology

## 2021-10-21 VITALS — BP 111/50 | HR 57 | Temp 97.6°F

## 2021-10-21 DIAGNOSIS — D509 Iron deficiency anemia, unspecified: Secondary | ICD-10-CM | POA: Insufficient documentation

## 2021-10-21 MED ORDER — SODIUM CHLORIDE 0.9 % IV SOLN: 200.0000 mg | Freq: Once | INTRAVENOUS | Status: DC

## 2021-10-21 MED ORDER — IRON SUCROSE 20 MG/ML IV SOLN
200.0000 mg | Freq: Once | INTRAVENOUS | Status: AC
  Administered 2021-10-21: 200 mg via INTRAVENOUS
  Filled 2021-10-21: qty 10

## 2021-10-21 MED ORDER — SODIUM CHLORIDE 0.9 % IV SOLN
Freq: Once | INTRAVENOUS | Status: AC
  Filled 2021-10-21: qty 250

## 2021-10-21 NOTE — Patient Instructions (Signed)

## 2021-10-24 ENCOUNTER — Inpatient Hospital Stay: Payer: Medicare HMO

## 2021-10-24 ENCOUNTER — Other Ambulatory Visit: Payer: Self-pay

## 2021-10-24 ENCOUNTER — Ambulatory Visit: Payer: Medicare HMO | Admitting: Physician Assistant

## 2021-10-24 VITALS — BP 132/68 | HR 60 | Temp 97.4°F | Resp 18

## 2021-10-24 DIAGNOSIS — F0280 Dementia in other diseases classified elsewhere without behavioral disturbance: Secondary | ICD-10-CM | POA: Diagnosis not present

## 2021-10-24 DIAGNOSIS — E538 Deficiency of other specified B group vitamins: Secondary | ICD-10-CM | POA: Diagnosis not present

## 2021-10-24 DIAGNOSIS — J189 Pneumonia, unspecified organism: Secondary | ICD-10-CM | POA: Diagnosis not present

## 2021-10-24 DIAGNOSIS — R338 Other retention of urine: Secondary | ICD-10-CM | POA: Diagnosis not present

## 2021-10-24 DIAGNOSIS — N401 Enlarged prostate with lower urinary tract symptoms: Secondary | ICD-10-CM | POA: Diagnosis not present

## 2021-10-24 DIAGNOSIS — I10 Essential (primary) hypertension: Secondary | ICD-10-CM | POA: Diagnosis not present

## 2021-10-24 DIAGNOSIS — E1122 Type 2 diabetes mellitus with diabetic chronic kidney disease: Secondary | ICD-10-CM | POA: Diagnosis not present

## 2021-10-24 DIAGNOSIS — N138 Other obstructive and reflux uropathy: Secondary | ICD-10-CM | POA: Diagnosis not present

## 2021-10-24 DIAGNOSIS — D509 Iron deficiency anemia, unspecified: Secondary | ICD-10-CM | POA: Diagnosis not present

## 2021-10-24 DIAGNOSIS — N184 Chronic kidney disease, stage 4 (severe): Secondary | ICD-10-CM | POA: Diagnosis not present

## 2021-10-24 MED ORDER — IRON SUCROSE 20 MG/ML IV SOLN
200.0000 mg | Freq: Once | INTRAVENOUS | Status: AC
  Administered 2021-10-24: 200 mg via INTRAVENOUS

## 2021-10-24 MED ORDER — SODIUM CHLORIDE 0.9 % IV SOLN
Freq: Once | INTRAVENOUS | Status: AC
  Filled 2021-10-24: qty 250

## 2021-10-24 MED ORDER — SODIUM CHLORIDE 0.9 % IV SOLN: 200.0000 mg | Freq: Once | INTRAVENOUS | Status: DC

## 2021-10-25 ENCOUNTER — Ambulatory Visit: Payer: Medicare HMO | Admitting: Internal Medicine

## 2021-10-26 ENCOUNTER — Encounter: Payer: Self-pay | Admitting: Internal Medicine

## 2021-10-26 ENCOUNTER — Other Ambulatory Visit: Payer: Self-pay

## 2021-10-26 ENCOUNTER — Ambulatory Visit (INDEPENDENT_AMBULATORY_CARE_PROVIDER_SITE_OTHER): Payer: Medicare HMO | Admitting: Internal Medicine

## 2021-10-26 ENCOUNTER — Other Ambulatory Visit
Admission: RE | Admit: 2021-10-26 | Discharge: 2021-10-26 | Disposition: A | Discharge status: Home / Self Care | Payer: Medicare HMO | Source: Ambulatory Visit | Attending: Urology | Admitting: Urology

## 2021-10-26 VITALS — BP 138/68 | HR 74 | Ht 66.0 in | Wt 170.0 lb

## 2021-10-26 DIAGNOSIS — R310 Gross hematuria: Secondary | ICD-10-CM

## 2021-10-26 DIAGNOSIS — I483 Typical atrial flutter: Secondary | ICD-10-CM | POA: Diagnosis not present

## 2021-10-26 DIAGNOSIS — I10 Essential (primary) hypertension: Secondary | ICD-10-CM

## 2021-10-26 DIAGNOSIS — R531 Weakness: Secondary | ICD-10-CM | POA: Diagnosis not present

## 2021-10-26 NOTE — Assessment & Plan Note (Signed)
Blood pressure is stable 

## 2021-10-26 NOTE — Progress Notes (Signed)
Established Patient Office Visit  Subjective:  Patient ID: Michael Simon, male    DOB: [birth year]  Age: 87 y.o. MRN: [id number]  CC:  Chief Complaint  Patient presents with   Follow-up    HPI  Michael Simon  patient has shown some improvement in the last 3 days, physical therapy has not started yet.  Complain of any chest pain shortness of breath or nocturnal dyspnea he does not smoke does not drink.  Past Medical History:  Diagnosis Date   Atrial fibrillation and flutter ([place])    a.) CHA2DS2-VASc = 5 (age x 2, HTN, prior MI, T1DM). b.) no medications for rate/rhythm; no longer on daily anticoagulation.   Basal cell carcinoma 04/14/2021   left medial clavicle EDC 05/31/2021   Basal cell carcinoma 04/14/2021   nose tip    Basal cell carcinoma (BCC) of skin of nose 02/15/2021   CAD (coronary artery disease)    a.) s/p CABG in 2012   COPD (chronic obstructive pulmonary disease) ([place])    Diverticulitis 2022   HLD (hyperlipidemia)    HTN (hypertension)    Hx of CABG 03/07/2011   IDA (iron deficiency anemia)    LBBB (left bundle branch block)    Myocardial infarction ([place])    Osteoarthritis    Type II diabetes mellitus ([place])     Past Surgical History:  Procedure Laterality Date   CARDIAC CATHETERIZATION     CATARACT EXTRACTION     COLONOSCOPY     COLONOSCOPY WITH PROPOFOL N/A 08/26/2019   Procedure: COLONOSCOPY WITH PROPOFOL;  Surgeon: [name], MD;  [facility] ENDOSCOPY;  Service: Gastroenterology;  Laterality: N/A;   COLONOSCOPY WITH PROPOFOL N/A 09/22/2019   Procedure: COLONOSCOPY WITH PROPOFOL;  Surgeon: [name], MD;  Location: [facility] ENDOSCOPY;  Service: Gastroenterology;  Laterality: N/A;  pediatric colonoscope needed   CORONARY ARTERY BYPASS GRAFT  2012   CYSTOSCOPY WITH FULGERATION N/A 09/06/2021   Procedure: CYSTOSCOPY WITH FULGERATION-Possible Bladder Biospy;  Surgeon: Abbie Sons, MD;  Location: [facility] ORS;  Service: Urology;  Laterality:  N/A;   EYE SURGERY     INGUINAL HERNIA REPAIR     JOINT REPLACEMENT     knees   TRANSURETHRAL RESECTION OF BLADDER TUMOR N/A 09/06/2021   Procedure: TRANSURETHRAL RESECTION OF BLADDER TUMOR (TURBT)-Possible;  Surgeon: Abbie Sons, MD;  Location: [facility] ORS;  Service: Urology;  Laterality: N/A;    Family History  Problem Relation Age of Onset   Arthritis Mother    Heart disease Mother    Heart disease Other    Lung cancer Son     Social History   Socioeconomic History   Marital status: Widowed    Spouse name: Not on file   Number of children: Not on file   Years of education: Not on file   Highest education level: Not on file  Occupational History   Not on file  Tobacco Use   Smoking status: Former    Types: Cigarettes    Quit date: 03/03/2008    Years since quitting: 13.6   Smokeless tobacco: Never  Vaping Use   Vaping Use: Never used  Substance and Sexual Activity   Alcohol use: No    Alcohol/week: 0.0 standard drinks   Drug use: No   Sexual activity: Not on file  Other Topics Concern   Not on file  Social History Narrative   Not on file   Social Determinants of Health   Financial Resource Strain: Not  on file  Food Insecurity: Not on file  Transportation Needs: Not on file  Physical Activity: Not on file  Stress: Not on file  Social Connections: Not on file  Intimate Partner Violence: Not on file     Current Outpatient Medications:    amiodarone (PACERONE) 200 MG tablet, Take 200 mg by mouth daily., Disp: , Rfl:    apixaban (ELIQUIS) 2.5 MG TABS tablet, Take by mouth 2 (two) times daily., Disp: , Rfl:    Cyanocobalamin (B-12 PO), Take 1 tablet by mouth daily., Disp: , Rfl:    Ferrous Sulfate (IRON PO), Take 1 tablet by mouth daily., Disp: , Rfl:    finasteride (PROSCAR) 5 MG tablet, Take 1 tablet (5 mg total) by mouth daily., Disp: 30 tablet, Rfl: 11   furosemide (LASIX) 20 MG tablet, Take 20 mg by mouth daily., Disp: , Rfl:    insulin degludec  (TRESIBA FLEXTOUCH) 100 UNIT/ML FlexTouch Pen, Inject 5-15 Units into the skin daily as needed (high blood sugar)., Disp: , Rfl:    levocetirizine (XYZAL) 5 MG tablet, Take 5 mg by mouth daily as needed for allergies., Disp: , Rfl:    lovastatin (MEVACOR) 40 MG tablet, TAKE 1 TABLET BY MOUTH EVERYDAY AT BEDTIME (Patient taking differently: Take 40 mg by mouth at bedtime.), Disp: 90 tablet, Rfl: 2   pantoprazole (PROTONIX) 40 MG tablet, TAKE 1 TABLET BY MOUTH EVERY DAY, Disp: 90 tablet, Rfl: 3   tamsulosin (FLOMAX) 0.4 MG CAPS capsule, Take 1 capsule (0.4 mg total) by mouth daily., Disp: 30 capsule, Rfl: 3   No Known Allergies  ROS Review of Systems  Constitutional: Negative.   HENT: Negative.    Eyes: Negative.   Respiratory: Negative.    Cardiovascular: Negative.   Gastrointestinal: Negative.   Endocrine: Negative.   Genitourinary: Negative.   Musculoskeletal: Negative.   Skin: Negative.   Allergic/Immunologic: Negative.   Neurological:  Positive for weakness and light-headedness. Negative for seizures, speech difficulty and numbness.  Hematological: Negative.   Psychiatric/Behavioral: Negative.    All other systems reviewed and are negative.    Objective:    Physical Exam Vitals reviewed.  Constitutional:      Appearance: Normal appearance.  HENT:     Mouth/Throat:     Mouth: Mucous membranes are moist.  Eyes:     Pupils: Pupils are equal, round, and reactive to light.  Neck:     Vascular: No carotid bruit.  Cardiovascular:     Rate and Rhythm: Normal rate and regular rhythm.     Pulses: Normal pulses.     Heart sounds: Normal heart sounds.  Pulmonary:     Effort: Pulmonary effort is normal.     Breath sounds: Normal breath sounds.  Abdominal:     General: Bowel sounds are normal.     Palpations: Abdomen is soft. There is no hepatomegaly, splenomegaly or mass.     Tenderness: There is no abdominal tenderness.     Hernia: No hernia is present.  Musculoskeletal:      Cervical back: Neck supple.     Right lower leg: No edema.     Left lower leg: No edema.  Skin:    Findings: No rash.  Neurological:     Mental Status: He is alert and oriented to person, place, and time.     Motor: No weakness.     Comments: Patient is weak in the both legs.  He came to the office in the wheelchair.  Motor  strength is okay in the both hands.  Both legs are weak.  Vibration sense is not present.  Deep tendon reflexes are 2+.  Psychiatric:        Mood and Affect: Mood normal.        Behavior: Behavior normal.    BP 138/68   Pulse 74   Ht _0  (1.676 m)   Wt 170 lb (77.1 kg)   BMI 27.44 kg/m  Wt Readings from Last 3 Encounters:  10/26/21 170 lb (77.1 kg)  10/19/21 170 lb (77.1 kg)  10/17/21 170 lb (77.1 kg)     Health Maintenance Due  Topic Date Due   FOOT EXAM  Never done   OPHTHALMOLOGY EXAM  Never done   URINE MICROALBUMIN  Never done   TETANUS/TDAP  Never done   Zoster Vaccines- Shingrix (1 of 2) Never done   Pneumonia Vaccine 24+ Years old (2 - PCV) 03/10/2012   COVID-19 Vaccine (3 - Pfizer risk series) 04/08/2020    There are no preventive care reminders to display for this patient.  Lab Results  Component Value Date   TSH 3.698 01/02/2021   Lab Results  Component Value Date   WBC 9.2 10/17/2021   HGB 10.3 (L) 10/17/2021   HCT 31.0 (L) 10/17/2021   MCV 97.5 10/17/2021   PLT 185 10/17/2021   Lab Results  Component Value Date   NA 136 10/17/2021   K 4.2 10/17/2021   CO2 24 10/17/2021   GLUCOSE 114 (H) 10/17/2021   BUN 27 (H) 10/17/2021   CREATININE 1.74 (H) 10/17/2021   BILITOT 0.4 10/11/2021   ALKPHOS 111 09/01/2021   AST 18 10/11/2021   ALT 10 10/11/2021   PROT 5.9 (L) 10/11/2021   ALBUMIN 3.7 09/01/2021   CALCIUM 8.5 (L) 10/17/2021   ANIONGAP 11 10/17/2021   EGFR 36 (L) 10/11/2021   No results found for: CHOL No results found for: HDL No results found for: LDLCALC No results found for: TRIG No results found for:  CHOLHDL Lab Results  Component Value Date   HGBA1C 7.6 (H) 09/01/2021      Assessment & Plan:   Problem List Items Addressed This Visit       Cardiovascular and Mediastinum   Essential hypertension    Blood pressure is stable.      Atrial flutter (HCC)    Atrial flutter fibrillation is a chronic problem.        Genitourinary   Hematuria, gross    Hematuria has resolved.  Patient is not a candidate for any anticoagulation therapy for atrial fibrillation        Other   Weakness - Primary    We will try to get the physical therapy started as today is very possible, son does not want want him to have an anymore x-ray of the back      Relevant Orders   DG Lumbar Spine 2-3 Views    No orders of the defined types were placed in this encounter.   Follow-up: No follow-ups on file.    [name], MD

## 2021-10-26 NOTE — Pre-Procedure Instructions (Signed)
This Probation officer spoke with Milagros Loll, patient's son, he voices that his father has a follow up appointment with Dr. Lavera Guise today and would like to reschedule the PAT appointment today. Per Milagros Loll his father may have to postpone his surgery due to his health. PAT appointment will be rescheduled .

## 2021-10-26 NOTE — Assessment & Plan Note (Signed)
Hematuria has resolved.  Patient is not a candidate for any anticoagulation therapy for atrial fibrillation

## 2021-10-26 NOTE — Assessment & Plan Note (Signed)
We will try to get the physical therapy started as today is very possible, son does not want want him to have an anymore x-ray of the back

## 2021-10-26 NOTE — Assessment & Plan Note (Signed)
Atrial flutter fibrillation is a chronic problem.

## 2021-10-27 DIAGNOSIS — E538 Deficiency of other specified B group vitamins: Secondary | ICD-10-CM | POA: Diagnosis not present

## 2021-10-27 DIAGNOSIS — F0280 Dementia in other diseases classified elsewhere without behavioral disturbance: Secondary | ICD-10-CM | POA: Diagnosis not present

## 2021-10-27 DIAGNOSIS — N401 Enlarged prostate with lower urinary tract symptoms: Secondary | ICD-10-CM | POA: Diagnosis not present

## 2021-10-27 DIAGNOSIS — E1122 Type 2 diabetes mellitus with diabetic chronic kidney disease: Secondary | ICD-10-CM | POA: Diagnosis not present

## 2021-10-27 DIAGNOSIS — N184 Chronic kidney disease, stage 4 (severe): Secondary | ICD-10-CM | POA: Diagnosis not present

## 2021-10-27 DIAGNOSIS — R338 Other retention of urine: Secondary | ICD-10-CM | POA: Diagnosis not present

## 2021-10-27 DIAGNOSIS — N138 Other obstructive and reflux uropathy: Secondary | ICD-10-CM | POA: Diagnosis not present

## 2021-10-27 DIAGNOSIS — J189 Pneumonia, unspecified organism: Secondary | ICD-10-CM | POA: Diagnosis not present

## 2021-10-27 DIAGNOSIS — I10 Essential (primary) hypertension: Secondary | ICD-10-CM | POA: Diagnosis not present

## 2021-10-28 ENCOUNTER — Inpatient Hospital Stay: Payer: Medicare HMO

## 2021-10-28 ENCOUNTER — Other Ambulatory Visit: Payer: Self-pay

## 2021-10-28 VITALS — BP 121/87 | HR 100

## 2021-10-28 DIAGNOSIS — D509 Iron deficiency anemia, unspecified: Secondary | ICD-10-CM | POA: Diagnosis not present

## 2021-10-28 MED ORDER — SODIUM CHLORIDE 0.9 % IV SOLN: 200.0000 mg | Freq: Once | INTRAVENOUS | Status: DC

## 2021-10-28 MED ORDER — SODIUM CHLORIDE 0.9 % IV SOLN
Freq: Once | INTRAVENOUS | Status: AC
  Filled 2021-10-28: qty 250

## 2021-10-28 MED ORDER — IRON SUCROSE 20 MG/ML IV SOLN
200.0000 mg | Freq: Once | INTRAVENOUS | Status: AC
  Administered 2021-10-28: 200 mg via INTRAVENOUS
  Filled 2021-10-28: qty 10

## 2021-10-31 ENCOUNTER — Other Ambulatory Visit: Payer: Self-pay

## 2021-10-31 ENCOUNTER — Encounter
Admission: RE | Admit: 2021-10-31 | Discharge: 2021-10-31 | Disposition: A | Discharge status: Home / Self Care | Payer: Medicare HMO | Source: Ambulatory Visit | Attending: Urology | Admitting: Urology

## 2021-10-31 ENCOUNTER — Ambulatory Visit (INDEPENDENT_AMBULATORY_CARE_PROVIDER_SITE_OTHER): Payer: Medicare HMO | Admitting: Physician Assistant

## 2021-10-31 ENCOUNTER — Encounter: Payer: Self-pay | Admitting: Physician Assistant

## 2021-10-31 VITALS — BP 104/68 | HR 40 | Temp 97.9°F | Ht 68.0 in | Wt 170.0 lb

## 2021-10-31 DIAGNOSIS — E538 Deficiency of other specified B group vitamins: Secondary | ICD-10-CM | POA: Diagnosis not present

## 2021-10-31 DIAGNOSIS — I10 Essential (primary) hypertension: Secondary | ICD-10-CM | POA: Diagnosis not present

## 2021-10-31 DIAGNOSIS — T85628A Displacement of other specified internal prosthetic devices, implants and grafts, initial encounter: Secondary | ICD-10-CM

## 2021-10-31 DIAGNOSIS — R338 Other retention of urine: Secondary | ICD-10-CM | POA: Diagnosis not present

## 2021-10-31 DIAGNOSIS — J189 Pneumonia, unspecified organism: Secondary | ICD-10-CM | POA: Diagnosis not present

## 2021-10-31 DIAGNOSIS — F0280 Dementia in other diseases classified elsewhere without behavioral disturbance: Secondary | ICD-10-CM | POA: Diagnosis not present

## 2021-10-31 DIAGNOSIS — R339 Retention of urine, unspecified: Secondary | ICD-10-CM

## 2021-10-31 DIAGNOSIS — N138 Other obstructive and reflux uropathy: Secondary | ICD-10-CM | POA: Diagnosis not present

## 2021-10-31 DIAGNOSIS — N401 Enlarged prostate with lower urinary tract symptoms: Secondary | ICD-10-CM | POA: Diagnosis not present

## 2021-10-31 DIAGNOSIS — E1122 Type 2 diabetes mellitus with diabetic chronic kidney disease: Secondary | ICD-10-CM | POA: Diagnosis not present

## 2021-10-31 DIAGNOSIS — N184 Chronic kidney disease, stage 4 (severe): Secondary | ICD-10-CM | POA: Diagnosis not present

## 2021-10-31 DIAGNOSIS — R310 Gross hematuria: Secondary | ICD-10-CM

## 2021-10-31 HISTORY — DX: Other symptoms and signs involving cognitive functions and awareness: R41.89

## 2021-10-31 HISTORY — DX: Chronic kidney disease, stage 3 unspecified: N18.30

## 2021-10-31 HISTORY — DX: Atherosclerosis of aorta: I70.0

## 2021-10-31 HISTORY — DX: Gastro-esophageal reflux disease without esophagitis: K21.9

## 2021-10-31 NOTE — Progress Notes (Signed)
10/31/2021 5:06 PM   Michael Simon December 20, 1932 [id number]  CC: Chief Complaint  Patient presents with   Hematuria    Blood in cath bag   HPI: Michael Simon is a 87 y.o. male with PMH recurrent urinary retention and gross hematuria scheduled for UroLift with Dr. Diamantina Providence in 4 days who presents today for evaluation of gross hematuria.  He is accompanied today by his son, who contributes to HPI.  Today he reports an episode of nondraining Foley catheter that occurred approximately 2 days ago.  On further investigation, it appeared the catheter got twisted around the StatLock and kinked the tubing.  Urine began flowing once the kink was resolved.  Patient awoke this morning with significant gross hematuria in his bag.  There have been some clots visualized.  He denies lower abdominal pain.  In-office UA today positive for red color and turbid quality, UA unable to be read due to pigment interference; urine microscopy with >30 RBCs/HPF.  PMH: Past Medical History:  Diagnosis Date   Acute HFrEF (heart failure with reduced ejection fraction) ([place]) 09/01/2021   a.) TTE 09/01/2021: EF 35-40%; mild-mod BAE, mild MR   Aortic atherosclerosis (HCC)    Atrial fibrillation and flutter ([place])    a.) CHA2DS2-VASc = 5 (age x 2, HTN, prior MI, T2DM). b.) no medications for rate/rhythm; no longer on chronic anticoagulation due to renal insufficiency.   Basal cell carcinoma 04/14/2021   left medial clavicle EDC 05/31/2021   Basal cell carcinoma 04/14/2021   nose tip    Basal cell carcinoma (BCC) of skin of nose 02/15/2021   CAD (coronary artery disease)    a.) s/p CABG in 2012   CKD (chronic kidney disease), stage III (HCC)    Cognitive impairment    COPD (chronic obstructive pulmonary disease) ([place])    Diverticulitis 2022   GERD (gastroesophageal reflux disease)    HLD (hyperlipidemia)    HTN (hypertension)    Hx of CABG 03/07/2011   IDA (iron deficiency anemia)    LBBB (left  bundle branch block)    Myocardial infarction ([place])    Osteoarthritis    Type II diabetes mellitus ([place])     Surgical History: Past Surgical History:  Procedure Laterality Date   CARDIAC CATHETERIZATION     CATARACT EXTRACTION     COLONOSCOPY     COLONOSCOPY WITH PROPOFOL N/A 08/26/2019   Procedure: COLONOSCOPY WITH PROPOFOL;  Surgeon: [name], MD;  Location: [facility] ENDOSCOPY;  Service: Gastroenterology;  Laterality: N/A;   COLONOSCOPY WITH PROPOFOL N/A 09/22/2019   Procedure: COLONOSCOPY WITH PROPOFOL;  Surgeon: [name], MD;  Location: [facility] ENDOSCOPY;  Service: Gastroenterology;  Laterality: N/A;  pediatric colonoscope needed   CORONARY ARTERY BYPASS GRAFT  2012   CYSTOSCOPY WITH FULGERATION N/A 09/06/2021   Procedure: CYSTOSCOPY WITH FULGERATION-Possible Bladder Biospy;  Surgeon: Abbie Sons, MD;  Location: [facility] ORS;  Service: Urology;  Laterality: N/A;   EYE SURGERY     INGUINAL HERNIA REPAIR     JOINT REPLACEMENT     knees   TRANSURETHRAL RESECTION OF BLADDER TUMOR N/A 09/06/2021   Procedure: TRANSURETHRAL RESECTION OF BLADDER TUMOR (TURBT)-Possible;  Surgeon: Abbie Sons, MD;  Location: [facility] ORS;  Service: Urology;  Laterality: N/A;    Home Medications:  Allergies as of 10/31/2021   No Known Allergies      Medication List        Accurate as of October 31, 2021  5:06 PM. If you have  any questions, ask your nurse or doctor.          amiodarone 200 MG tablet Commonly known as: PACERONE Take 200 mg by mouth daily.   apixaban 2.5 MG Tabs tablet Commonly known as: ELIQUIS Take by mouth 2 (two) times daily.   B-12 PO Take 1 tablet by mouth daily.   finasteride 5 MG tablet Commonly known as: PROSCAR Take 1 tablet (5 mg total) by mouth daily.   furosemide 20 MG tablet Commonly known as: LASIX Take 20 mg by mouth daily.   IRON PO Take 1 tablet by mouth daily.   levocetirizine 5 MG tablet Commonly known as: XYZAL Take 5 mg by mouth daily as  needed for allergies.   lovastatin 40 MG tablet Commonly known as: MEVACOR TAKE 1 TABLET BY MOUTH EVERYDAY AT BEDTIME What changed: See the new instructions.   pantoprazole 40 MG tablet Commonly known as: PROTONIX TAKE 1 TABLET BY MOUTH EVERY DAY   tamsulosin 0.4 MG Caps capsule Commonly known as: FLOMAX Take 1 capsule (0.4 mg total) by mouth daily.   Tyler Aas FlexTouch 100 UNIT/ML FlexTouch Pen Generic drug: insulin degludec Inject 5-15 Units into the skin daily as needed (high blood sugar).        Allergies:  No Known Allergies  Family History: Family History  Problem Relation Age of Onset   Arthritis Mother    Heart disease Mother    Heart disease Other    Lung cancer Son     Social History:   reports that he quit smoking about 13 years ago. His smoking use included cigarettes. He has never used smokeless tobacco. He reports that he does not drink alcohol and does not use drugs.  Physical Exam: BP 104/68   Pulse (!) 40   Temp 97.9 F (36.6 C)   Ht 5\' 8"  (1.727 m)   Wt 170 lb (77.1 kg)   SpO2 97%   BMI 25.85 kg/m   Constitutional:  Alert and oriented, no acute distress, nontoxic appearing HEENT: [place], AT Cardiovascular: No clubbing, cyanosis, or edema Respiratory: Normal respiratory effort, no increased work of breathing GU: 18 French catheter in place draining red urine with clots. Skin: No rashes, bruises or suspicious lesions Neurologic: Grossly intact, no focal deficits, moving all 4 extremities Psychiatric: Normal mood and affect  Laboratory Data: Results for orders placed or performed in visit on 10/31/21  Microscopic Examination   Urine  Result Value Ref Range   WBC, UA 0-5 0 - 5 /hpf   RBC >30 (A) 0 - 2 /hpf   Epithelial Cells (non renal) None seen 0 - 10 /hpf   Bacteria, UA Few (A) None seen/Few  Urinalysis, Complete  Result Value Ref Range   Specific Gravity, UA CANCELED    pH, UA CANCELED    Color, UA Red (A) Yellow   Appearance Ur  Turbid (A) Clear   Protein,UA CANCELED    Glucose, UA CANCELED    Ketones, UA CANCELED    Microscopic Examination See below:   Hemoglobin and hematocrit, blood  Result Value Ref Range   Hemoglobin 10.7 (L) 13.0 - 17.7 g/dL   Hematocrit 33.1 (L) 37.5 - 51.0 %   Cath Change/ Replacement  Patient is present today for a catheter change due to urinary retention.  77ml of water was removed from the balloon, a 18FR coude foley cath was removed without difficulty.  Patient was cleaned and prepped in a sterile fashion with betadine and 2% lidocaine jelly  was instilled into the urethra. A 22 FR coude foley cath was replaced into the bladder no complications were noted Urine return was noted 81ml and urine was pink in color. The balloon was filled with 76ml of sterile water. A leg bag was attached for drainage.  A night bag was also given to the patient. Patient tolerated well.    Performed by: Debroah Loop, PA-C   Bladder Irrigation  Due to gross hematuria patient is present today for a bladder irrigation. Patient was cleaned and prepped in a sterile fashion. 240 ml of sterile water was instilled and irrigated into the bladder with a 76ml [place]y syringe through the catheter in place.  After irrigation urine flow was noted no complications were noted catheter is now draining fine.  Catheter was reattached to the leg bag for drainage. Patient tolerated well.   Performed by: Debroah Loop, PA-C   Assessment & Plan:   1. Hematuria, gross Recurrent.  I attempted to irrigate the patient's catheter due to concerns for possible retained clot, however I had some difficulty with this concerning for occluded catheter.  At this point, I elected to exchange and upsize his catheter to increase ease of irrigation.  See procedure note above.  I then irrigated the catheter upon upsizing and his urine rapidly cleared from maroon to pink with no significant clot evacuation.  I hyperinflated his Foley  balloon to 20 cc to tamponade the prostate.  Was no evidence of significant, active bleeding, I felt it was appropriate to send him home from clinic.  Out of an abundance of caution, I did obtain an H&H in clinic and will call him tomorrow with his results.  Additionally, UA today is reassuring for infection. - Urinalysis, Complete - Hemoglobin and hematocrit, blood  Return for Will call with results.  Debroah Loop, PA-C  [facility] [address], [place], [place] [address] [phone]

## 2021-10-31 NOTE — Patient Instructions (Signed)
Your procedure is scheduled on: 11/04/21 Report to [place]. To find out your arrival time please call [phone] between 1PM - 3PM on 01/03/21.  Remember: Instructions that are not followed completely may result in serious medical risk, up to and including death, or upon the discretion of your surgeon and anesthesiologist your surgery may need to be rescheduled.     _X__ 1. Do not eat food or drink any liquids after midnight the night before your procedure.                 No gum chewing or hard candies.   __X__2.  On the morning of surgery brush your teeth with toothpaste and water, you                 may rinse your mouth with mouthwash if you wish.  Do not swallow any              toothpaste of mouthwash.     _X__ 3.  No Alcohol for 24 hours before or after surgery.   _X__ 4.  Do Not Smoke or use e-cigarettes For 24 Hours Prior to Your Surgery.                 Do not use any chewable tobacco products for at least 6 hours prior to                 surgery.  ____  5.  Bring all medications with you on the day of surgery if instructed.   __X__  6.  Notify your doctor if there is any change in your medical condition      (cold, fever, infections).     Do not wear jewelry, make-up, hairpins, clips or nail polish. Do not wear lotions, powders, or perfumes.  Do not shave body hair 48 hours prior to surgery. Men may shave face and neck. Do not bring valuables to the hospital.    [facility] is not responsible for any belongings or valuables.  Contacts, dentures/partials or body piercings may not be worn into surgery. Bring a case for your contacts, glasses or hearing aids, a denture cup will be supplied. Leave your suitcase in the car. After surgery it may be brought to your room. For patients admitted to the hospital, discharge time is determined by your treatment team.   Patients discharged the day of surgery will not be  allowed to drive home.   Please read over the following fact sheets that you were given:     __X__ Take these medicines the morning of surgery with A SIP OF WATER:    1. pantoprazole (PROTONIX) 40 MG tablet  2. finasteride (PROSCAR) 5 MG tablet  3.   4.  5.  6.  ____ Fleet Enema (as directed)   ____ Use CHG Soap/SAGE wipes as directed  ____ Use inhalers on the day of surgery  ____ Stop metformin/Janumet/Farxiga 2 days prior to surgery    __X__ Take 1/2 of usual bedtime insulin dose the night before surgery. No changes to insulin taken with meals No insulin the morning          of surgery.   ____ Stop Blood Thinners Coumadin/Plavix/Xarelto/Pleta/Pradaxa/Eliquis/Effient/Aspirin  on   Or contact your Surgeon, Cardiologist or Medical Doctor regarding  ability to stop your blood thinners  __X__ Stop Anti-inflammatories 7 days before surgery such as Advil, Ibuprofen, Motrin,  BC or Goodies Powder,  Naprosyn, Naproxen, Aleve, Aspirin    __X__ Stop all herbal supplements, fish oil or vitamin E until after surgery.    ____ Bring C-Pap to the hospital.

## 2021-10-31 NOTE — Progress Notes (Signed)
Perioperative Services  Pre-Admission/Anesthesia Testing Clinical Review  Date: 10/31/21  Patient Demographics:  Name: Michael Simon DOB:   1933-02-16 MRN:   [id number]  Planned Surgical Procedure(s):    Case: [id number] Date/Time: 11/04/21 0715   Procedure: CYSTOSCOPY WITH INSERTION OF UROLIFT   Anesthesia type: General   Pre-op diagnosis: BPH with bladder outlet obstruction   Location: [facility] OR ROOM 10 / [place] [facility]S FOR ANESTHESIA GROUP   Surgeons: Billey Co, MD   NOTE: Available PAT nursing documentation and vital signs have been reviewed. Clinical nursing staff has updated patient's PMH/PSHx, current medication list, and drug allergies/intolerances to ensure comprehensive history available to assist in medical decision making as it pertains to the aforementioned surgical procedure and anticipated anesthetic course. Extensive review of available clinical information performed. [name] PMH and PSHx updated with any diagnoses/procedures that  may have been inadvertently omitted during his intake with the pre-admission testing department's nursing staff.  Clinical Discussion:  DAT [name] is a 87 y.o. male who is submitted for pre-surgical anesthesia review and clearance prior to him undergoing the above procedure. Patient is a Former Smoker (quit 02/2008). Pertinent PMH includes: CAD (s/p CABG), MI, atrial fibrillation/flutter, HFrEF, LBBB, aortic atherosclerosis, HTN, HLD, T2DM, CKD-III, COPD, IDA, OA, cognitive impairment (mixed dementia).   Patient is followed by cardiology [name], MD). He was last seen in the cardiology clinic on 10/18/2021; notes reviewed.  At the time of his clinic visit, patient doing well overall from a cardiovascular perspective.  He denied any chest pain, shortness of breath, PND, orthopnea, palpitations, significant peripheral edema, vertiginous symptoms, or presyncope/syncope.  PMH significant for cardiovascular diagnose  Patient has suffered  an MI in the past, however dates of cardiac event unknown.  Records do indicate the patient underwent a CABG procedure in 2012; records regarding bypass graft placed available for review at time of consult.  Of note, information regarding these events obtained from notes supplied by primary cardiologist.  Patient underwent a TTE (09/01/2021 that revealed a moderately reduced left ventricular systolic function with an EF of 35-40%.  There were no regional wall motion abnormalities.  There was mild to moderate biatrial enlargement. There is no evidence of significant valvular regurgitation or transvalvular gradient suggestive of stenosis.     PMH significant for atrial fibrillation/flutter; CHA2DS2-VASc Score = 5 (age x 2, HTN, prior MI, T2DM).  Rate and rhythm maintained on daily amiodarone dose.  Patient formally chronically anticoagulated using daily apixaban, however due to anemia, hypotension, and renal insufficiency, this medication is currently on hold.  Blood pressure reasonably controlled at 130/72 on currently prescribed diuretic monotherapy.  Patient is on a statin for his HLD and further ASCVD prevention.  T2DM loosely controlled on currently prescribed regimen; last Hgb A1c was 7.6% when checked on 09/01/2021.  Functional capacity limited progressive weakness and age-related debility; unable to achieve 4 METS of activity.  No changes were made to his medication regimen.  Patient to follow-up with outpatient cardiology in 3 months, at which time he will be transitioning care to Dr. [name], MD.  [name] is scheduled for a CYSTOSCOPY WITH INSERTION OF UROLIFT on 11/04/2021 with Dr [name], MD.  Given patient's past medical history significant for cardiovascular diagnoses, presurgical cardiac clearance was sought by the PAT team. Per cardiology, "this patient is optimized for surgery and may proceed with the planned procedural course with a MODERATE to HIGH risk of significant  perioperative cardiovascular complications".  This patient is not  currently taking any type of anticoagulation/antiplatelet therapies that would need to be held during the perioperative period.  Patient denies previous perioperative complications with anesthesia in the past. In review of the available records, it is noted that patient underwent a general anesthetic course here (ASA III) in 08/2021 without documented complications.   Vitals with BMI 10/31/2021 10/28/2021 10/26/2021  Height 5\' 8"  - 5\' 6"   Weight - - 170 lbs  BMI - - 46.50  Systolic - 354 656  Diastolic - 87 68  Pulse - 100 74    Providers/Specialists:   NOTE: Primary physician provider listed below. Patient may have been seen by APP or partner within same practice.   PROVIDER ROLE / SPECIALTY LAST [name], MD UROLOGY 09/30/2021  [name], MD PRIMARY CARE PROVIDER 10/26/2021  [name], MD CARDIOLOGY 10/18/2021  [name], MD HEMATOLOGY 10/12/2021   Allergies:  Patient has no known allergies.  Current Home Medications:    Cyanocobalamin (B-12 PO)   Ferrous Sulfate (IRON PO)   finasteride (PROSCAR) 5 MG tablet   insulin degludec (TRESIBA FLEXTOUCH) 100 UNIT/ML FlexTouch Pen   levocetirizine (XYZAL) 5 MG tablet   lovastatin (MEVACOR) 40 MG tablet   pantoprazole (PROTONIX) 40 MG tablet   amiodarone (PACERONE) 200 MG tablet   apixaban (ELIQUIS) 2.5 MG TABS tablet   furosemide (LASIX) 20 MG tablet   tamsulosin (FLOMAX) 0.4 MG CAPS capsule   No current facility-administered medications for this encounter.   History:   Past Medical History:  Diagnosis Date   Acute HFrEF (heart failure with reduced ejection fraction) ([place]) 09/01/2021   a.) TTE 09/01/2021: EF 35-40%; mild-mod BAE, mild MR   Aortic atherosclerosis (HCC)    Atrial fibrillation and flutter ([place])    a.) CHA2DS2-VASc = 5 (age x 2, HTN, prior MI, T2DM). b.) no medications for rate/rhythm; no longer on chronic  anticoagulation due to renal insufficiency.   Basal cell carcinoma 04/14/2021   left medial clavicle EDC 05/31/2021   Basal cell carcinoma 04/14/2021   nose tip    Basal cell carcinoma (BCC) of skin of nose 02/15/2021   CAD (coronary artery disease)    a.) s/p CABG in 2012   CKD (chronic kidney disease), stage III (HCC)    Cognitive impairment    COPD (chronic obstructive pulmonary disease) ([place])    Diverticulitis 2022   GERD (gastroesophageal reflux disease)    HLD (hyperlipidemia)    HTN (hypertension)    Hx of CABG 03/07/2011   IDA (iron deficiency anemia)    LBBB (left bundle branch block)    Myocardial infarction ([place])    Osteoarthritis    Type II diabetes mellitus ([place])    Past Surgical History:  Procedure Laterality Date   CARDIAC CATHETERIZATION     CATARACT EXTRACTION     COLONOSCOPY     COLONOSCOPY WITH PROPOFOL N/A 08/26/2019   Procedure: COLONOSCOPY WITH PROPOFOL;  Surgeon: [name], MD;  Location[facility] ENDOSCOPY;  Service: Gastroenterology;  Laterality: N/A;   COLONOSCOPY WITH PROPOFOL N/A 09/22/2019   Procedure: COLONOSCOPY WITH PROPOFOL;  Surgeon: [name], MD;  Location: [facility] ENDOSCOPY;  Service: Gastroenterology;  Laterality: N/A;  pediatric colonoscope needed   CORONARY ARTERY BYPASS GRAFT  2012   CYSTOSCOPY WITH FULGERATION N/A 09/06/2021   Procedure: CYSTOSCOPY WITH FULGERATION-Possible Bladder Biospy;  Surgeon: Abbie Sons, MD;  Location: [facility] ORS;  Service: Urology;  Laterality: N/A;   EYE SURGERY     INGUINAL HERNIA REPAIR  JOINT REPLACEMENT     knees   TRANSURETHRAL RESECTION OF BLADDER TUMOR N/A 09/06/2021   Procedure: TRANSURETHRAL RESECTION OF BLADDER TUMOR (TURBT)-Possible;  Surgeon: Abbie Sons, MD;  Location: [facility] ORS;  Service: Urology;  Laterality: N/A;   Family History  Problem Relation Age of Onset   Arthritis Mother    Heart disease Mother    Heart disease Other    Lung cancer Son    Social History   Tobacco Use    Smoking status: Former    Types: Cigarettes    Quit date: 03/03/2008    Years since quitting: 13.6   Smokeless tobacco: Never  Vaping Use   Vaping Use: Never used  Substance Use Topics   Alcohol use: No    Alcohol/week: 0.0 standard drinks   Drug use: No    Pertinent Clinical Results:  LABS: Labs reviewed: Acceptable for surgery.  No visits with results within 3 Day(s) from this visit.  Latest known visit with results is:  Admission on 10/17/2021, Discharged on 10/17/2021  Component Date Value Ref Range Status   Sodium 10/17/2021 136  135 - 145 mmol/L Final   Potassium 10/17/2021 4.2  3.5 - 5.1 mmol/L Final   Chloride 10/17/2021 101  98 - 111 mmol/L Final   CO2 10/17/2021 24  22 - 32 mmol/L Final   Glucose, Bld 10/17/2021 114 (A)  70 - 99 mg/dL Final   Glucose reference range applies only to samples taken after fasting for at least 8 hours.   BUN 10/17/2021 27 (A)  8 - 23 mg/dL Final   Creatinine, Ser 10/17/2021 1.74 (A)  0.61 - 1.24 mg/dL Final   Calcium 10/17/2021 8.5 (A)  8.9 - 10.3 mg/dL Final   GFR, Estimated 10/17/2021 38 (A)  >60 mL/min Final   Comment: (NOTE) Calculated using the CKD-EPI Creatinine Equation (2021)    Anion gap 10/17/2021 11  5 - 15 Final   Performed at [facility], [address] [place], [place] [address]   WBC 10/17/2021 9.2  4.0 - 10.5 K/uL Final   RBC 10/17/2021 3.18 (A)  4.22 - 5.81 MIL/uL Final   Hemoglobin 10/17/2021 10.3 (A)  13.0 - 17.0 g/dL Final   HCT 10/17/2021 31.0 (A)  39.0 - 52.0 % Final   MCV 10/17/2021 97.5  80.0 - 100.0 fL Final   MCH 10/17/2021 32.4  26.0 - 34.0 pg Final   MCHC 10/17/2021 33.2  30.0 - 36.0 g/dL Final   RDW 10/17/2021 15.3  11.5 - 15.5 % Final   Platelets 10/17/2021 185  150 - 400 K/uL Final   nRBC 10/17/2021 0.0  0.0 - 0.2 % Final   Performed at [facility], [place], [place] [address]   Troponin I (High Sensitivity) 10/17/2021 16  <18 ng/L Final   SARS Coronavirus 2 by RT  PCR 10/17/2021 NEGATIVE  NEGATIVE Final   Influenza A by PCR 10/17/2021 NEGATIVE  NEGATIVE Final   Influenza B by PCR 10/17/2021 NEGATIVE  NEGATIVE Final    ECG: Date: 10/17/2021 Time ECG obtained: 0813 AM Rate: 75 bpm Rhythm: atrial flutter; NSIVCD Axis (leads I and aVF): Right axis deviation Intervals: QRS 172 ms. QTc 533 ms. ST segment and T wave changes: No evidence of acute ST segment elevation or depression Comparison: Similar to previous tracing obtained on 01/11/2021   IMAGING / PROCEDURES: DIAGNOSTIC RADIOGRAPHS OF CHEST 2 VIEWS performed on 10/12/2021 Improved right perihilar and right basilar airspace opacities, suggestive of  improving pneumonia. Recommend continued imaging follow-up to resolution. Cardiomegaly. Similar mild diffuse interstitial thickening, likely chronic.  TRANSTHORACIC ECHOCARDIOGRAM performed on 09/01/2021 Left ventricular ejection fraction, by estimation, is 35 to 40%. The left ventricle has moderately decreased function. The left ventricle has no regional wall motion abnormalities. There is mild concentric left ventricular hypertrophy. Left ventricular diastolic parameters are indeterminate.  Right ventricular systolic function is normal. The right ventricular size is normal.  Left atrial size was mild to moderately dilated.  Right atrial size was mild to moderately dilated.  The pericardial effusion is circumferential. The mitral valve is normal in structure. Mild mitral valve regurgitation. No evidence of mitral stenosis.  The aortic valve is normal in structure. Aortic valve regurgitation is not visualized. No aortic stenosis is present.  The inferior vena cava is normal in size with greater than 50% respiratory variability, suggesting right atrial pressure of 3 mmHg.   CTA CHEST WITH OR WITHOUT CONTRAST performed on 01/03/2021 No demonstrable pulmonary embolus. No thoracic aortic aneurysm. No dissection seen with caveat that there is insufficient  contrast within the aorta to assess for potential dissection. There is aortic atherosclerosis as well as foci of great vessel and native coronary artery calcification. Patient is status post coronary artery bypass grafting. There is cardiomegaly. Right pleural effusion with right base atelectasis and suspected small focus of pneumonia right base posteriorly. Underlying emphysematous change with scarring and fibrotic type change, primarily in the upper lobe regions. Suspect lower lobe interstitial pulmonary edema. There may well be a degree of congestive heart failure. Reflux of contrast into the inferior vena cava and hepatic veins, a finding that likely is indicative of increased right heart pressure. Sternal separation despite wire fixation. Gallbladder absent. Aortic atherosclerosis  Impression and Plan:  SHERON ROBIN has been referred for pre-anesthesia review and clearance prior to him undergoing the planned anesthetic and procedural courses. Available labs, pertinent testing, and imaging results were personally reviewed by me. This patient has been appropriately cleared by cardiology with an overall MODERATE to HIGH risk of significant perioperative cardiovascular complications.  Based on clinical review performed today (10/31/21), barring any significant acute changes in the patient's overall condition, it is anticipated that he will be able to proceed with the planned surgical intervention. Any acute changes in clinical condition may necessitate his procedure being postponed and/or cancelled. Patient will meet with anesthesia team (MD and/or CRNA) on the day of his procedure for preoperative evaluation/assessment. Questions regarding anesthetic course will be fielded at that time.   Pre-surgical instructions were reviewed with the patient during his PAT appointment and questions were fielded by PAT clinical staff. Patient was advised that if any questions or concerns arise prior to his  procedure then he should return a call to PAT and/or his surgeon's office to discuss.  Honor Loh, MSN, APRN, FNP-C, CEN [facility]  Peri-operative Services Nurse Practitioner Phone: [phone] Fax: [phone] 10/31/21 11:00 AM  NOTE: This note has been prepared using Dragon dictation software. Despite my best ability to proofread, there is always the potential that unintentional transcriptional errors may still occur from this process.

## 2021-11-01 ENCOUNTER — Telehealth: Payer: Self-pay

## 2021-11-01 LAB — URINALYSIS, COMPLETE

## 2021-11-01 LAB — HEMOGLOBIN AND HEMATOCRIT, BLOOD
Hematocrit: 33.1 % — ABNORMAL LOW (ref 37.5–51.0)
Hemoglobin: 10.7 g/dL — ABNORMAL LOW (ref 13.0–17.7)

## 2021-11-01 LAB — MICROSCOPIC EXAMINATION
Epithelial Cells (non renal): NONE SEEN /HPF (ref 0–10)
RBC, Urine: >30 /HPF — AB (ref 0–2)

## 2021-11-01 NOTE — Telephone Encounter (Signed)
Notified patient's son Rickie as advised. He notes patient did not have anymore blood in his bag last night or this morning.

## 2021-11-01 NOTE — Telephone Encounter (Signed)
-----   Message from Debroah Loop, [place] sent at 11/01/2021 11:53 AM EST ----- Blood counts are stable, excellent news. Keep plans for surgery Friday. ----- Message ----- From: Lavone Neri Lab Results In Sent: 11/01/2021   5:37 AM EST To: Debroah Loop, PA-C

## 2021-11-03 ENCOUNTER — Telehealth: Payer: Self-pay

## 2021-11-03 NOTE — Telephone Encounter (Signed)
Patients son rickie called stating the patients catheter is not draining. Patients son stated he drained the bag at 9 am and now only 50 ml in the bag and urine in the tube. Patient son stated the patient only drank two bottles of water and 1 cup of coffee. I advised patient that he is not drinking enough water and that the catheter does not seem as if it is clogged. He denies any hematuria, blood clots, stomach distention, urgency or any other symptoms at this time. Pt son verbalized understanding.

## 2021-11-04 ENCOUNTER — Encounter: Payer: Self-pay | Admitting: Urology

## 2021-11-04 ENCOUNTER — Encounter
Admission: RE | Disposition: A | Discharge status: Home / Self Care | Payer: Self-pay | Source: Home / Self Care | Attending: Urology

## 2021-11-04 ENCOUNTER — Ambulatory Visit: Payer: Medicare HMO | Admitting: Urgent Care

## 2021-11-04 ENCOUNTER — Other Ambulatory Visit: Payer: Self-pay

## 2021-11-04 ENCOUNTER — Ambulatory Visit
Admission: RE | Admit: 2021-11-04 | Discharge: 2021-11-04 | Disposition: A | Discharge status: Home / Self Care | Payer: Medicare HMO | Attending: Urology | Admitting: Urology

## 2021-11-04 DIAGNOSIS — E785 Hyperlipidemia, unspecified: Secondary | ICD-10-CM | POA: Diagnosis not present

## 2021-11-04 DIAGNOSIS — N401 Enlarged prostate with lower urinary tract symptoms: Secondary | ICD-10-CM | POA: Diagnosis present

## 2021-11-04 DIAGNOSIS — I130 Hypertensive heart and chronic kidney disease with heart failure and stage 1 through stage 4 chronic kidney disease, or unspecified chronic kidney disease: Secondary | ICD-10-CM | POA: Diagnosis not present

## 2021-11-04 DIAGNOSIS — I2510 Atherosclerotic heart disease of native coronary artery without angina pectoris: Secondary | ICD-10-CM | POA: Diagnosis not present

## 2021-11-04 DIAGNOSIS — I4891 Unspecified atrial fibrillation: Secondary | ICD-10-CM | POA: Insufficient documentation

## 2021-11-04 DIAGNOSIS — Z85828 Personal history of other malignant neoplasm of skin: Secondary | ICD-10-CM | POA: Diagnosis not present

## 2021-11-04 DIAGNOSIS — E1122 Type 2 diabetes mellitus with diabetic chronic kidney disease: Secondary | ICD-10-CM | POA: Insufficient documentation

## 2021-11-04 DIAGNOSIS — Z87891 Personal history of nicotine dependence: Secondary | ICD-10-CM | POA: Insufficient documentation

## 2021-11-04 DIAGNOSIS — N138 Other obstructive and reflux uropathy: Secondary | ICD-10-CM | POA: Diagnosis not present

## 2021-11-04 DIAGNOSIS — R338 Other retention of urine: Secondary | ICD-10-CM | POA: Diagnosis not present

## 2021-11-04 DIAGNOSIS — Z951 Presence of aortocoronary bypass graft: Secondary | ICD-10-CM | POA: Insufficient documentation

## 2021-11-04 DIAGNOSIS — N183 Chronic kidney disease, stage 3 unspecified: Secondary | ICD-10-CM | POA: Diagnosis not present

## 2021-11-04 DIAGNOSIS — I252 Old myocardial infarction: Secondary | ICD-10-CM | POA: Diagnosis not present

## 2021-11-04 DIAGNOSIS — R339 Retention of urine, unspecified: Secondary | ICD-10-CM | POA: Diagnosis not present

## 2021-11-04 HISTORY — DX: Unspecified atrial flutter: I48.92

## 2021-11-04 HISTORY — DX: Unspecified atrial fibrillation: I48.91

## 2021-11-04 HISTORY — PX: CYSTOSCOPY WITH INSERTION OF UROLIFT: SHX6678

## 2021-11-04 HISTORY — DX: Iron deficiency anemia, unspecified: D50.9

## 2021-11-04 HISTORY — DX: Chronic obstructive pulmonary disease, unspecified: J44.9

## 2021-11-04 LAB — GLUCOSE, CAPILLARY
Glucose-Capillary: 102 mg/dL — ABNORMAL HIGH (ref 70–99)
Glucose-Capillary: 137 mg/dL — ABNORMAL HIGH (ref 70–99)

## 2021-11-04 SURGERY — CYSTOSCOPY WITH INSERTION OF UROLIFT
Anesthesia: General

## 2021-11-04 MED ORDER — PROPOFOL 10 MG/ML IV BOLUS
INTRAVENOUS | Status: DC | PRN
  Administered 2021-11-04: 120 mg via INTRAVENOUS

## 2021-11-04 MED ORDER — CEFAZOLIN SODIUM-DEXTROSE 2-4 GM/100ML-% IV SOLN
INTRAVENOUS | Status: AC
  Filled 2021-11-04: qty 100

## 2021-11-04 MED ORDER — PROPOFOL 500 MG/50ML IV EMUL
INTRAVENOUS | Status: AC
  Filled 2021-11-04: qty 50

## 2021-11-04 MED ORDER — ACETAMINOPHEN 10 MG/ML IV SOLN: 1000.0000 mg | Freq: Once | INTRAVENOUS | Status: DC | PRN

## 2021-11-04 MED ORDER — CHLORHEXIDINE GLUCONATE 0.12 % MT SOLN
15.0000 mL | Freq: Once | OROMUCOSAL | Status: AC
  Administered 2021-11-04: 15 mL via OROMUCOSAL

## 2021-11-04 MED ORDER — LACTATED RINGERS IV SOLN: INTRAVENOUS | Status: DC

## 2021-11-04 MED ORDER — OXYCODONE HCL 5 MG PO TABS: 5.0000 mg | ORAL_TABLET | Freq: Once | ORAL | Status: DC | PRN

## 2021-11-04 MED ORDER — OXYCODONE HCL 5 MG/5ML PO SOLN: 5.0000 mg | Freq: Once | ORAL | Status: DC | PRN

## 2021-11-04 MED ORDER — LIDOCAINE HCL (CARDIAC) PF 100 MG/5ML IV SOSY
PREFILLED_SYRINGE | INTRAVENOUS | Status: DC | PRN
  Administered 2021-11-04: 80 mg via INTRAVENOUS

## 2021-11-04 MED ORDER — CHLORHEXIDINE GLUCONATE 0.12 % MT SOLN
OROMUCOSAL | Status: AC
  Filled 2021-11-04: qty 15

## 2021-11-04 MED ORDER — LIDOCAINE HCL (PF) 2 % IJ SOLN
INTRAMUSCULAR | Status: AC
  Filled 2021-11-04: qty 5

## 2021-11-04 MED ORDER — SODIUM CHLORIDE 0.9 % IV SOLN: INTRAVENOUS | Status: DC

## 2021-11-04 MED ORDER — FENTANYL CITRATE (PF) 100 MCG/2ML IJ SOLN
INTRAMUSCULAR | Status: AC
  Filled 2021-11-04: qty 2

## 2021-11-04 MED ORDER — PHENYLEPHRINE HCL-NACL 20-0.9 MG/250ML-% IV SOLN
INTRAVENOUS | Status: AC
  Filled 2021-11-04: qty 250

## 2021-11-04 MED ORDER — ONDANSETRON HCL 4 MG/2ML IJ SOLN
INTRAMUSCULAR | Status: AC
  Filled 2021-11-04: qty 2

## 2021-11-04 MED ORDER — PHENYLEPHRINE HCL (PRESSORS) 10 MG/ML IV SOLN
INTRAVENOUS | Status: AC
  Filled 2021-11-04: qty 1

## 2021-11-04 MED ORDER — CEFAZOLIN SODIUM-DEXTROSE 2-4 GM/100ML-% IV SOLN: 2.0000 g | INTRAVENOUS | Status: DC

## 2021-11-04 MED ORDER — ONDANSETRON HCL 4 MG/2ML IJ SOLN
INTRAMUSCULAR | Status: DC | PRN
  Administered 2021-11-04: 4 mg via INTRAVENOUS

## 2021-11-04 MED ORDER — FENTANYL CITRATE (PF) 100 MCG/2ML IJ SOLN: 25.0000 ug | INTRAMUSCULAR | Status: DC | PRN

## 2021-11-04 MED ORDER — ONDANSETRON HCL 4 MG/2ML IJ SOLN: 4.0000 mg | Freq: Once | INTRAMUSCULAR | Status: DC | PRN

## 2021-11-04 MED ORDER — ORAL CARE MOUTH RINSE: 15.0000 mL | Freq: Once | OROMUCOSAL | Status: AC

## 2021-11-04 MED ORDER — PHENYLEPHRINE HCL (PRESSORS) 10 MG/ML IV SOLN
INTRAVENOUS | Status: DC | PRN
  Administered 2021-11-04: 160 ug via INTRAVENOUS
  Administered 2021-11-04: 240 ug via INTRAVENOUS
  Administered 2021-11-04: 80 ug via INTRAVENOUS
  Administered 2021-11-04: 240 ug via INTRAVENOUS
  Administered 2021-11-04: 80 ug via INTRAVENOUS

## 2021-11-04 SURGICAL SUPPLY — 19 items
BAG DRAIN CYSTO-URO LG1000N (MISCELLANEOUS) ×2 IMPLANT
BRUSH SCRUB EZ  4% CHG (MISCELLANEOUS)
BRUSH SCRUB EZ 4% CHG (MISCELLANEOUS) IMPLANT
CATH FOL 2WAY LX 22X30 (CATHETERS) ×2 IMPLANT
GAUZE 4X4 16PLY ~~LOC~~+RFID DBL (SPONGE) ×2 IMPLANT
GLOVE SURG UNDER POLY LF SZ7.5 (GLOVE) ×4 IMPLANT
GOWN STRL REUS W/ TWL LRG LVL3 (GOWN DISPOSABLE) ×1 IMPLANT
GOWN STRL REUS W/ TWL XL LVL3 (GOWN DISPOSABLE) ×1 IMPLANT
GOWN STRL REUS W/TWL LRG LVL3 (GOWN DISPOSABLE) ×2
GOWN STRL REUS W/TWL XL LVL3 (GOWN DISPOSABLE) ×2
KIT TURNOVER CYSTO (KITS) ×2 IMPLANT
MANIFOLD NEPTUNE II (INSTRUMENTS) IMPLANT
PACK CYSTO AR (MISCELLANEOUS) ×2 IMPLANT
SET CYSTO W/LG BORE CLAMP LF (SET/KITS/TRAYS/PACK) ×2 IMPLANT
SURGILUBE 2OZ TUBE FLIPTOP (MISCELLANEOUS) ×2 IMPLANT
SYSTEM UROLIFT (Male Continence) ×8 IMPLANT
WATER STERILE IRR 1000ML POUR (IV SOLUTION) IMPLANT
WATER STERILE IRR 3000ML UROMA (IV SOLUTION) ×2 IMPLANT
WATER STERILE IRR 500ML POUR (IV SOLUTION) ×2 IMPLANT

## 2021-11-04 NOTE — H&P (Signed)
11/04/21 6:58 AM   Michael Simon 11-29-1933 [id number]  CC: BPH and urinary retention  HPI: Comorbid 87 year old male who presented with overflow urinary incontinence and bladder scans greater than 800 mL and a catheter was placed.  He ultimately developed gross hematuria that required fulguration of prostatic bleeding with Dr. Bernardo Heater.  Prostate measures 34 g on CT.  Options discussed extensively, and he ultimately opted for UroLift.   PMH: Past Medical History:  Diagnosis Date   Acute HFrEF (heart failure with reduced ejection fraction) ([place]) 09/01/2021   a.) TTE 09/01/2021: EF 35-40%; mild-mod BAE, mild MR   Aortic atherosclerosis (HCC)    Atrial fibrillation and flutter ([place])    a.) CHA2DS2-VASc = 5 (age x 2, HTN, prior MI, T2DM). b.) no medications for rate/rhythm; no longer on chronic anticoagulation due to renal insufficiency.   Basal cell carcinoma 04/14/2021   left medial clavicle EDC 05/31/2021   Basal cell carcinoma 04/14/2021   nose tip    Basal cell carcinoma (BCC) of skin of nose 02/15/2021   CAD (coronary artery disease)    a.) s/p CABG in 2012   CKD (chronic kidney disease), stage III (HCC)    Cognitive impairment    COPD (chronic obstructive pulmonary disease) ([place])    Diverticulitis 2022   GERD (gastroesophageal reflux disease)    HLD (hyperlipidemia)    HTN (hypertension)    Hx of CABG 03/07/2011   IDA (iron deficiency anemia)    LBBB (left bundle branch block)    Myocardial infarction ([place])    Osteoarthritis    Type II diabetes mellitus ([place])     Surgical History: Past Surgical History:  Procedure Laterality Date   CARDIAC CATHETERIZATION     CATARACT EXTRACTION     COLONOSCOPY     COLONOSCOPY WITH PROPOFOL N/A 08/26/2019   Procedure: COLONOSCOPY WITH PROPOFOL;  Surgeon: [name], MD;  Location: [facility] ENDOSCOPY;  Service: Gastroenterology;  Laterality: N/A;   COLONOSCOPY WITH PROPOFOL N/A 09/22/2019   Procedure: COLONOSCOPY WITH PROPOFOL;   Surgeon: [name], MD;  Location: [facility] ENDOSCOPY;  Service: Gastroenterology;  Laterality: N/A;  pediatric colonoscope needed   CORONARY ARTERY BYPASS GRAFT  2012   CYSTOSCOPY WITH FULGERATION N/A 09/06/2021   Procedure: CYSTOSCOPY WITH FULGERATION-Possible Bladder Biospy;  Surgeon: Abbie Sons, MD;  Location: [facility] ORS;  Service: Urology;  Laterality: N/A;   EYE SURGERY     INGUINAL HERNIA REPAIR     JOINT REPLACEMENT     knees   TRANSURETHRAL RESECTION OF BLADDER TUMOR N/A 09/06/2021   Procedure: TRANSURETHRAL RESECTION OF BLADDER TUMOR (TURBT)-Possible;  Surgeon: Abbie Sons, MD;  Location: [facility] ORS;  Service: Urology;  Laterality: N/A;      Family History: Family History  Problem Relation Age of Onset   Arthritis Mother    Heart disease Mother    Heart disease Other    Lung cancer Son     Social History:  reports that he quit smoking about 13 years ago. His smoking use included cigarettes. He has never used smokeless tobacco. He reports that he does not drink alcohol and does not use drugs.  Physical Exam: BP 127/73   Pulse 61   Temp 97.8 F (36.6 C) (Temporal)   Resp 18   SpO2 95%    Constitutional:  Alert and oriented, No acute distress. Cardiovascular: Regular rate and rhythm Respiratory: Clear to auscultation bilaterally GI: Abdomen is soft, nontender, nondistended, no abdominal masses   Laboratory Data: Culture  10/26 with mixed flora  Assessment & Plan:   87 year old comorbid male with urinary retention, recurrent gross hematuria, 34 g prostate who opted for UroLift.  We discussed possible need for conversion to HOLEP if he has significant prostatic bleeding at the time of surgery, or possible need for additional procedures in the future.  We also discussed risks of bleeding, infection, persistent urinary retention secondary to atonic bladder, and potential need for additional procedures in the future.  Proceed with UroLift today  Michael Madrid,  MD 11/04/2021  [place] [address], [place], [place] [address] [phone]

## 2021-11-04 NOTE — Op Note (Signed)
Date of procedure: 11/04/2021   Preoperative diagnosis:  BPH with obstruction and urinary retention   Postoperative diagnosis:  Same   Procedure: UroLift (total of 4 implants placed)   Surgeon: [name], MD   Anesthesia: General   Complications: None   Intraoperative findings:  1.  Small prostate with obstructing lateral lobes, high bladder neck, wide open channel at conclusion 2.  No suspicious bladder lesions, dilated blood vessels at trigone, ureteral orifices orthotopic bilaterally   EBL: Minimal   Specimens: None   Drains: None   Indication: Michael Simon is a comorbid 87 year old male with Foley dependent urinary retention and intermittent gross hematuria who opted for UroLift.  After reviewing the management options for treatment, they elected to proceed with the above surgical procedure(s). We have discussed the potential benefits and risks of the procedure, side effects of the proposed treatment, the likelihood of the patient achieving the goals of the procedure, and any potential problems that might occur during the procedure or recuperation. Informed consent has been obtained.   Description of procedure:   The patient was taken to the operating room and anesthesia with LMA was induced. SCDs were placed for DVT prophylaxis. The patient was placed in the dorsal lithotomy position, prepped and draped in the usual sterile fashion, and preoperative antibiotics were administered. A preoperative time-out was performed.    The visual obturator with the 20 French rigid cystoscope was used to intubate the meatus and a normal-appearing urethra was followed proximally into the bladder.  The prostate was small with obstructing lateral lobes and a slightly elevated bladder neck.  Cystoscopy showed mild bladder trabeculations but no suspicious lesions and the ureteral orifices were orthotopic bilaterally.  There were subtle dilated blood vessels at the trigone, but no  suspicious lesions.  Mild catheter irritation at the posterior bladder wall.   I started on the right side by placing a UroLift implant at the apex 1 cm distal to the bladder neck.  An identical implant was placed on the patient's left side.  I then moved back to the verumontanum, and an additional implant was placed on the patient's right side just proximal to the Veru, and an identical procedure was performed on the left side.  At this point I re-examined the channel with the visual obturator and  there was a wide open channel anteriorly into the bladder. The ureteral orifices were orthotopic at the end of the case, with no clips or abnormalities within the bladder.  There was no significant bleeding.   A total of 4 UroLift implants were placed.   A 22 French Foley passed easily into the bladder, and 15 mL were placed in the balloon.  Urine was clear.   Disposition: Stable to PACU   Plan: RTC with PA 2 to 3 days voiding trial Follow-up with MD in 4 weeks for IPSS/PVR  [name], MD 11/04/2021

## 2021-11-04 NOTE — Transfer of Care (Signed)
Immediate Anesthesia Transfer of Care Note  Patient: Michael Simon  Procedure(s) Performed: CYSTOSCOPY WITH INSERTION OF UROLIFT  Patient Location: PACU  Anesthesia Type:General  Level of Consciousness: awake  Airway & Oxygen Therapy: Patient Spontanous Breathing and Patient connected to face mask oxygen  Post-op Assessment: Report given to RN and Post -op Vital signs reviewed and stable  Post vital signs: Reviewed and stable  Last Vitals:  Vitals Value Taken Time  BP 114/55 11/04/21 0806  Temp 36.1 C 11/04/21 0806  Pulse 106 11/04/21 0808  Resp 20 11/04/21 0809  SpO2 100 % 11/04/21 0808  Vitals shown include unvalidated device data.  Last Pain:  Vitals:   11/04/21 0806  TempSrc:   PainSc: Asleep         Complications: No notable events documented.

## 2021-11-04 NOTE — Discharge Instructions (Addendum)
You underwent a UroLift for enlarged prostate and blockage.  It is normal to have blood in the urine over the next few days.  Do not resume your Eliquis for 1 week, do not resume this medication if the urine is bloody.  Drink plenty of fluids to keep the bladder flushed.   The clinic will call you to set up a appointment to remove your Foley catheter on Monday or Tuesday next week.   If you have thick blood clots in the catheter tubing or high fever over 101 degrees please call the clinic or present to the ER.        AMBULATORY SURGERY  DISCHARGE INSTRUCTIONS   The drugs that you were given will stay in your system until tomorrow so for the next 24 hours you should not:  Drive an automobile Make any legal decisions Drink any alcoholic beverage   You may resume regular meals tomorrow.  Today it is better to start with liquids and gradually work up to solid foods.  You may eat anything you prefer, but it is better to start with liquids, then soup and crackers, and gradually work up to solid foods.   Please notify your doctor immediately if you have any unusual bleeding, trouble breathing, redness and pain at the surgery site, drainage, fever, or pain not relieved by medication.    Additional Instructions:        Please contact your physician with any problems or Same Day Surgery at [phone], Monday through Friday 6 am to 4 pm, or  at [facility] number at [phone].

## 2021-11-04 NOTE — Anesthesia Procedure Notes (Signed)
Procedure Name: LMA Insertion Date/Time: 11/04/2021 7:43 AM Performed by: Aline Brochure, CRNA Pre-anesthesia Checklist: Patient identified, Patient being monitored, Timeout performed, Emergency Drugs available and Suction available Patient Re-evaluated:Patient Re-evaluated prior to induction Oxygen Delivery Method: Circle system utilized Preoxygenation: Pre-oxygenation with 100% oxygen Induction Type: IV induction Ventilation: Mask ventilation without difficulty LMA: LMA inserted LMA Size: 4.5 Tube type: Oral Number of attempts: 1 Placement Confirmation: positive ETCO2 and breath sounds checked- equal and bilateral Tube secured with: Tape Dental Injury: Teeth and Oropharynx as per pre-operative assessment

## 2021-11-04 NOTE — Anesthesia Postprocedure Evaluation (Signed)
Anesthesia Post Note  Patient: YOSGAR DEMIRJIAN  Procedure(s) Performed: CYSTOSCOPY WITH INSERTION OF UROLIFT  Patient location during evaluation: PACU Anesthesia Type: General Level of consciousness: awake and alert, oriented and patient cooperative Pain management: pain level controlled Vital Signs Assessment: post-procedure vital signs reviewed and stable Respiratory status: spontaneous breathing, nonlabored ventilation and respiratory function stable Cardiovascular status: blood pressure returned to baseline and stable Postop Assessment: adequate PO intake Anesthetic complications: no   No notable events documented.   Last Vitals:  Vitals:   11/04/21 0830 11/04/21 0845  BP: 100/84 (!) 115/49  Pulse:  88  Resp: 16 16  Temp: 36.4 C 36.6 C  SpO2: 97% 96%    Last Pain:  Vitals:   11/04/21 0845  TempSrc: Temporal  PainSc: 0-No pain                 Darrin Nipper

## 2021-11-04 NOTE — Anesthesia Preprocedure Evaluation (Addendum)
Anesthesia Evaluation  Patient identified by MRN, date of birth, ID band Patient awake    Reviewed: Allergy & Precautions, NPO status , Patient's Chart, lab work & pertinent test results  History of Anesthesia Complications Negative for: history of anesthetic complications  Airway Mallampati: IV   Neck ROM: Full    Dental  (+) Lower Dentures, Upper Dentures   Pulmonary pneumonia (3 weeks ago), former smoker (quit 2009),     + decreased breath sounds      Cardiovascular hypertension, + CAD (s/p MI and CABG)  Normal cardiovascular exam+ dysrhythmias Atrial Fibrillation  Rhythm:Regular Rate:Normal  ECG 10/19/21: a fib/flutter, RAD  TTE 09/01/21: moderately reduced left ventricular systolic function with an EF of 35-40%.  There were no regional wall motion abnormalities.  There was mild to moderate biatrial enlargement. There is no evidence of significant valvular regurgitation or transvalvular gradient suggestive of stenosis.   Neuro/Psych negative neurological ROS     GI/Hepatic negative GI ROS,   Endo/Other  diabetes, Type 2  Renal/GU Renal disease (stage III CKD)     Musculoskeletal  (+) Arthritis ,   Abdominal   Peds  Hematology  (+) Blood dyscrasia, anemia , Skin BCC   Anesthesia Other Findings Reviewed 10/18/21 cardiology note.  Reviewed and agree with Bayard Males pre-anesthesia clinical review note.  Reproductive/Obstetrics                            Anesthesia Physical Anesthesia Plan  ASA: 3  Anesthesia Plan: General   Post-op Pain Management:    Induction: Intravenous  PONV Risk Score and Plan: 2 and Ondansetron, Dexamethasone and Treatment may vary due to age or medical condition  Airway Management Planned: LMA  Additional Equipment:   Intra-op Plan:   Post-operative Plan: Extubation in OR  Informed Consent: I have reviewed the patients History and Physical, chart, labs  and discussed the procedure including the risks, benefits and alternatives for the proposed anesthesia with the patient or authorized representative who has indicated his/her understanding and acceptance.     Dental advisory given  Plan Discussed with: CRNA  Anesthesia Plan Comments: (Patient consented for risks of anesthesia including but not limited to:  - adverse reactions to medications - damage to eyes, teeth, lips or other oral mucosa - nerve damage due to positioning  - sore throat or hoarseness - damage to heart, brain, nerves, lungs, other parts of body or loss of life  Informed patient about role of CRNA in peri- and intra-operative care.  Patient voiced understanding.)       Anesthesia Quick Evaluation

## 2021-11-07 ENCOUNTER — Encounter: Payer: Self-pay | Admitting: Urology

## 2021-11-08 ENCOUNTER — Ambulatory Visit: Payer: Medicare HMO | Admitting: Physician Assistant

## 2021-11-08 ENCOUNTER — Other Ambulatory Visit: Payer: Self-pay

## 2021-11-08 ENCOUNTER — Ambulatory Visit: Payer: Medicare HMO | Admitting: Internal Medicine

## 2021-11-08 ENCOUNTER — Ambulatory Visit (INDEPENDENT_AMBULATORY_CARE_PROVIDER_SITE_OTHER): Payer: Medicare HMO | Admitting: Physician Assistant

## 2021-11-08 DIAGNOSIS — N138 Other obstructive and reflux uropathy: Secondary | ICD-10-CM

## 2021-11-08 DIAGNOSIS — N401 Enlarged prostate with lower urinary tract symptoms: Secondary | ICD-10-CM | POA: Diagnosis not present

## 2021-11-08 LAB — BLADDER SCAN AMB NON-IMAGING

## 2021-11-08 NOTE — Patient Instructions (Signed)
Congratulations on your recent Urolift procedure! As discussed in clinic today, some common postoperative findings include: Burning or pain with urination Lower abdominal/pelvic pain Blood in the urine Urinary leakage Typically, these are mild and resolve within 2-4 weeks of surgery. If you have any concerns, please contact our office.  

## 2021-11-08 NOTE — Progress Notes (Signed)
Catheter Removal  Patient is present today for a catheter removal.  12ml of water was drained from the balloon. A 22FR foley cath was removed from the bladder no complications were noted . Patient tolerated well.  Performed by: Debroah Loop, PA-C   Follow up/ Additional notes: RTC this afternoon for PVR.  Afternoon follow-up  Patient returned to clinic this afternoon for repeat PVR. He reports drinking approximately 30oz of fluid. He has not been able to urinate and but denies lower abdominal pain. PVR 574mL.  Results for orders placed or performed in visit on 11/08/21  BLADDER SCAN AMB NON-IMAGING  Result Value Ref Range   Scan Result 551mL     Voiding trial failed. Offered patient Foley catheter replacement, which he accepted.  Simple Catheter Placement  Due to urinary retention patient is present today for a foley cath placement.  Patient was cleaned and prepped in a sterile fashion with betadine and 2% lidocaine jelly was instilled into the urethra. A 18 FR foley catheter was inserted, urine return was noted  527ml, urine was yellow in color.  The balloon was filled with 10cc of sterile water.  A leg bag was attached for drainage. Patient tolerated well, no complications were noted   Performed by: Debroah Loop, PA-C   Additional notes/ Follow up: Return in about 1 week (around 11/15/2021) for Voiding trial.

## 2021-11-15 ENCOUNTER — Ambulatory Visit: Payer: Medicare HMO | Admitting: Internal Medicine

## 2021-11-16 ENCOUNTER — Ambulatory Visit: Payer: Medicare HMO | Admitting: Physician Assistant

## 2021-11-16 ENCOUNTER — Other Ambulatory Visit: Payer: Self-pay

## 2021-11-16 ENCOUNTER — Ambulatory Visit (INDEPENDENT_AMBULATORY_CARE_PROVIDER_SITE_OTHER): Payer: Medicare HMO | Admitting: Physician Assistant

## 2021-11-16 ENCOUNTER — Encounter: Payer: Self-pay | Admitting: Physician Assistant

## 2021-11-16 VITALS — BP 112/77 | HR 42 | Temp 98.2°F | Ht 67.0 in | Wt 172.0 lb

## 2021-11-16 DIAGNOSIS — R339 Retention of urine, unspecified: Secondary | ICD-10-CM

## 2021-11-16 NOTE — Progress Notes (Signed)
Patient presented to clinic today for scheduled voiding trial, however he reports he has not had a bowel movement in 3 days and is having significant abdominal pain.  He took a laxative yesterday.  He was initially tachycardic upon presentation.  He subsequently went to the restroom and passed flatus with relief of his discomfort.  His tachycardia resolved.  We discussed the constipation is not likely to increase his chances of passing a voiding trial.  I offered them a KUB for further evaluation of his stool burden, which they declined.  We discussed proceeding with scheduled voiding trial today versus deferring this pending initiation of a bowel regimen.  They elected for the latter and wish to proceed next Thursday.  I am in agreement with this plan, voiding trial scheduled.  Debroah Loop  11/16/21 10:29 AM.  Return in 8 days (on 11/24/2021) for Voiding trial.

## 2021-11-21 ENCOUNTER — Ambulatory Visit (INDEPENDENT_AMBULATORY_CARE_PROVIDER_SITE_OTHER): Payer: Medicare HMO | Admitting: Physician Assistant

## 2021-11-21 ENCOUNTER — Other Ambulatory Visit: Payer: Self-pay

## 2021-11-21 DIAGNOSIS — R310 Gross hematuria: Secondary | ICD-10-CM | POA: Diagnosis not present

## 2021-11-21 DIAGNOSIS — N3001 Acute cystitis with hematuria: Secondary | ICD-10-CM

## 2021-11-21 LAB — URINALYSIS, COMPLETE
Specific Gravity, UA: 1.015 (ref 1.005–1.030)
pH, UA: 5.5 (ref 5.0–7.5)

## 2021-11-21 LAB — MICROSCOPIC EXAMINATION
Epithelial Cells (non renal): NONE SEEN /hpf (ref 0–10)
RBC, Urine: >30 /hpf — ABNORMAL HIGH (ref 0–2)
WBC, UA: >30 /hpf — ABNORMAL HIGH (ref 0–5)

## 2021-11-21 MED ORDER — SULFAMETHOXAZOLE-TRIMETHOPRIM 800-160 MG PO TABS: 1.0000 | ORAL_TABLET | Freq: Two times a day (BID) | ORAL | 0 refills | Status: AC

## 2021-11-21 NOTE — Progress Notes (Signed)
11/21/2021 4:08 PM   [name] [birth year] [id number]  CC: Chief Complaint  Patient presents with   Hematuria   HPI: Michael Simon is a 87 y.o. male with PMH recurrent urinary retention and gross hematuria who underwent UroLift with Dr. Diamantina Providence on 11/04/2021 and failed his first postop voiding trial who presents today for evaluation of gross hematuria and low back pain.  He is accompanied today by his son, who contributes to HPI.   Today he reports a 2-day history of intermittent gross hematuria and low back pain without fever, chills, nausea, or vomiting.  We deferred his second postop voiding trial due to significant constipation, and patient has had 1 bowel movement since.  Family has been concerned about low urinary output and his catheter was irrigated this morning without difficulty.  Unclear if any clots were passed at that time.  Foley catheter in place today draining red-brown urine with no visible clots.  Catheter was plugged today to obtain a urine sample.  In-office UA notable for brown color, turbid quality, and unable to read due to pigment interference; urine microscopy with >30 WBCs/HPF, >30 RBCs/HPF, amorphous sediment, and many bacteria.  Bladder scan with 0 to 10 mL.  PMH: Past Medical History:  Diagnosis Date   Acute HFrEF (heart failure with reduced ejection fraction) ([place]) 09/01/2021   a.) TTE 09/01/2021: EF 35-40%; mild-mod BAE, mild MR   Aortic atherosclerosis (HCC)    Atrial fibrillation and flutter ([place])    a.) CHA2DS2-VASc = 5 (age x 2, HTN, prior MI, T2DM). b.) no medications for rate/rhythm; no longer on chronic anticoagulation due to renal insufficiency.   Basal cell carcinoma 04/14/2021   left medial clavicle EDC 05/31/2021   Basal cell carcinoma 04/14/2021   nose tip    Basal cell carcinoma (BCC) of skin of nose 02/15/2021   CAD (coronary artery disease)    a.) s/p CABG in 2012   CKD (chronic kidney disease), stage III (HCC)     Cognitive impairment    COPD (chronic obstructive pulmonary disease) ([place])    Diverticulitis 2022   GERD (gastroesophageal reflux disease)    HLD (hyperlipidemia)    HTN (hypertension)    Hx of CABG 03/07/2011   IDA (iron deficiency anemia)    LBBB (left bundle branch block)    Myocardial infarction ([place])    Osteoarthritis    Type II diabetes mellitus ([place])     Surgical History: Past Surgical History:  Procedure Laterality Date   CARDIAC CATHETERIZATION     CATARACT EXTRACTION     COLONOSCOPY     COLONOSCOPY WITH PROPOFOL N/A 08/26/2019   Procedure: COLONOSCOPY WITH PROPOFOL;  Surgeon: [name], MD;  Location: [facility] ENDOSCOPY;  Service: Gastroenterology;  Laterality: N/A;   COLONOSCOPY WITH PROPOFOL N/A 09/22/2019   Procedure: COLONOSCOPY WITH PROPOFOL;  Surgeon: [name], MD;  Location: [facility] ENDOSCOPY;  Service: Gastroenterology;  Laterality: N/A;  pediatric colonoscope needed   CORONARY ARTERY BYPASS GRAFT  2012   CYSTOSCOPY WITH FULGERATION N/A 09/06/2021   Procedure: CYSTOSCOPY WITH FULGERATION-Possible Bladder Biospy;  Surgeon: Abbie Sons, MD;  Location: [facility] ORS;  Service: Urology;  Laterality: N/A;   CYSTOSCOPY WITH INSERTION OF UROLIFT N/A 11/04/2021   Procedure: CYSTOSCOPY WITH INSERTION OF UROLIFT;  Surgeon: Billey Co, MD;  Location: [facility] ORS;  Service: Urology;  Laterality: N/A;   EYE SURGERY     INGUINAL HERNIA REPAIR     JOINT REPLACEMENT     knees  TRANSURETHRAL RESECTION OF BLADDER TUMOR N/A 09/06/2021   Procedure: TRANSURETHRAL RESECTION OF BLADDER TUMOR (TURBT)-Possible;  Surgeon: Abbie Sons, MD;  Location: [facility] ORS;  Service: Urology;  Laterality: N/A;    Home Medications:  Allergies as of 11/21/2021   No Known Allergies      Medication List        Accurate as of November 21, 2021  4:08 PM. If you have any questions, ask your nurse or doctor.          amiodarone 200 MG tablet Commonly known as: PACERONE Take 200 mg by mouth  daily.   apixaban 2.5 MG Tabs tablet Commonly known as: ELIQUIS Take by mouth 2 (two) times daily.   B-12 PO Take 1 tablet by mouth daily.   furosemide 20 MG tablet Commonly known as: LASIX Take 20 mg by mouth daily.   IRON PO Take 1 tablet by mouth daily.   levocetirizine 5 MG tablet Commonly known as: XYZAL Take 5 mg by mouth daily as needed for allergies.   lovastatin 40 MG tablet Commonly known as: MEVACOR TAKE 1 TABLET BY MOUTH EVERYDAY AT BEDTIME What changed: See the new instructions.   pantoprazole 40 MG tablet Commonly known as: PROTONIX TAKE 1 TABLET BY MOUTH EVERY DAY   sulfamethoxazole-trimethoprim 800-160 MG tablet Commonly known as: BACTRIM DS Take 1 tablet by mouth 2 (two) times daily for 7 days. Started by: Debroah Loop, PA-C   Tyler Aas FlexTouch 100 UNIT/ML FlexTouch Pen Generic drug: insulin degludec Inject 5-15 Units into the skin daily as needed (high blood sugar).        Allergies:  No Known Allergies  Family History: Family History  Problem Relation Age of Onset   Arthritis Mother    Heart disease Mother    Heart disease Other    Lung cancer Son     Social History:   reports that he quit smoking about 13 years ago. His smoking use included cigarettes. He has never used smokeless tobacco. He reports that he does not drink alcohol and does not use drugs.  Physical Exam: There were no vitals taken for this visit.  Constitutional:  Alert and oriented, no acute distress, nontoxic appearing HEENT: [place], AT Cardiovascular: No clubbing, cyanosis, or edema Respiratory: Normal respiratory effort, no increased work of breathing Skin: No rashes, bruises or suspicious lesions Neurologic: Grossly intact, no focal deficits, moving all 4 extremities Psychiatric: Normal mood and affect  Laboratory Data: Results for orders placed or performed in visit on 11/21/21  Microscopic Examination   Urine  Result Value Ref Range   WBC, UA >30  (H) 0 - 5 /hpf   RBC >30 (H) 0 - 2 /hpf   Epithelial Cells (non renal) None seen 0 - 10 /hpf   Crystals Present (A) N/A   Crystal Type Amorphous Sediment N/A   Bacteria, UA Many (A) None seen/Few  Urinalysis, Complete  Result Value Ref Range   Specific Gravity, UA 1.015 1.005 - 1.030   pH, UA 5.5 5.0 - 7.5   Color, UA Brown (A) Yellow   Appearance Ur Turbid (A) Clear   Protein,UA CANCELED    Glucose, UA CANCELED    Ketones, UA CANCELED    Microscopic Examination See below:    Assessment & Plan:   1. Hematuria, gross No warning signs of gross hematuria: Absence of clots or thick output.  Etiology includes prostatic bleeding versus postoperative bleeding versus acute cystitis as below. The patient and his family to  continue to irrigate the catheter as needed, though there is no indication for more invasive intervention at this time. - Urinalysis, Complete  2. Acute cystitis with hematuria UA today notable for bacteriuria, with hematuria and pyuria not atypical in the postoperative setting.  We will start treatment with empiric Bactrim and send for culture for further evaluation.  He is scheduled for second outpatient voiding trial with me in 3 days.  We discussed that if he does not feel significantly better at that time and continues to have constipation, we can continue to defer this.  We discussed that if he spikes a fever or develops chills, nausea, or vomiting, the need to proceed immediately to the emergency room. - CULTURE, URINE COMPREHENSIVE - sulfamethoxazole-trimethoprim (BACTRIM DS) 800-160 MG tablet; Take 1 tablet by mouth 2 (two) times daily for 7 days.  Dispense: 14 tablet; Refill: 0   Return if symptoms worsen or fail to improve.  Debroah Loop, PA-C  [facility] [address] [place], [place] [address] [phone]

## 2021-11-22 DIAGNOSIS — F05 Delirium due to known physiological condition: Secondary | ICD-10-CM | POA: Diagnosis not present

## 2021-11-22 DIAGNOSIS — N3091 Cystitis, unspecified with hematuria: Secondary | ICD-10-CM | POA: Diagnosis not present

## 2021-11-22 DIAGNOSIS — R918 Other nonspecific abnormal finding of lung field: Secondary | ICD-10-CM | POA: Diagnosis not present

## 2021-11-22 DIAGNOSIS — C801 Malignant (primary) neoplasm, unspecified: Secondary | ICD-10-CM | POA: Diagnosis not present

## 2021-11-22 DIAGNOSIS — I5022 Chronic systolic (congestive) heart failure: Secondary | ICD-10-CM | POA: Diagnosis not present

## 2021-11-22 DIAGNOSIS — R531 Weakness: Secondary | ICD-10-CM | POA: Diagnosis not present

## 2021-11-22 DIAGNOSIS — R410 Disorientation, unspecified: Secondary | ICD-10-CM | POA: Diagnosis not present

## 2021-11-22 DIAGNOSIS — N390 Urinary tract infection, site not specified: Secondary | ICD-10-CM | POA: Diagnosis not present

## 2021-11-22 DIAGNOSIS — I4892 Unspecified atrial flutter: Secondary | ICD-10-CM | POA: Diagnosis not present

## 2021-11-22 DIAGNOSIS — C799 Secondary malignant neoplasm of unspecified site: Secondary | ICD-10-CM | POA: Diagnosis not present

## 2021-11-22 DIAGNOSIS — I10 Essential (primary) hypertension: Secondary | ICD-10-CM | POA: Diagnosis not present

## 2021-11-22 DIAGNOSIS — I4891 Unspecified atrial fibrillation: Secondary | ICD-10-CM | POA: Diagnosis not present

## 2021-11-22 DIAGNOSIS — R509 Fever, unspecified: Secondary | ICD-10-CM | POA: Diagnosis not present

## 2021-11-22 DIAGNOSIS — M545 Low back pain, unspecified: Secondary | ICD-10-CM | POA: Diagnosis not present

## 2021-11-22 DIAGNOSIS — R591 Generalized enlarged lymph nodes: Secondary | ICD-10-CM | POA: Diagnosis not present

## 2021-11-22 DIAGNOSIS — R9341 Abnormal radiologic findings on diagnostic imaging of renal pelvis, ureter, or bladder: Secondary | ICD-10-CM | POA: Diagnosis not present

## 2021-11-22 DIAGNOSIS — R339 Retention of urine, unspecified: Secondary | ICD-10-CM | POA: Diagnosis not present

## 2021-11-22 DIAGNOSIS — R9431 Abnormal electrocardiogram [ECG] [EKG]: Secondary | ICD-10-CM | POA: Diagnosis not present

## 2021-11-22 DIAGNOSIS — K5900 Constipation, unspecified: Secondary | ICD-10-CM | POA: Diagnosis not present

## 2021-11-22 DIAGNOSIS — G893 Neoplasm related pain (acute) (chronic): Secondary | ICD-10-CM | POA: Diagnosis not present

## 2021-11-22 DIAGNOSIS — R310 Gross hematuria: Secondary | ICD-10-CM | POA: Diagnosis not present

## 2021-11-22 DIAGNOSIS — E46 Unspecified protein-calorie malnutrition: Secondary | ICD-10-CM | POA: Diagnosis not present

## 2021-11-22 DIAGNOSIS — R319 Hematuria, unspecified: Secondary | ICD-10-CM | POA: Diagnosis not present

## 2021-11-22 DIAGNOSIS — Z20822 Contact with and (suspected) exposure to covid-19: Secondary | ICD-10-CM | POA: Diagnosis not present

## 2021-11-22 DIAGNOSIS — R601 Generalized edema: Secondary | ICD-10-CM | POA: Diagnosis not present

## 2021-11-22 DIAGNOSIS — C7951 Secondary malignant neoplasm of bone: Secondary | ICD-10-CM | POA: Diagnosis not present

## 2021-11-22 DIAGNOSIS — N189 Chronic kidney disease, unspecified: Secondary | ICD-10-CM | POA: Diagnosis not present

## 2021-11-22 DIAGNOSIS — T83511A Infection and inflammatory reaction due to indwelling urethral catheter, initial encounter: Secondary | ICD-10-CM | POA: Diagnosis not present

## 2021-11-22 DIAGNOSIS — N179 Acute kidney failure, unspecified: Secondary | ICD-10-CM | POA: Diagnosis not present

## 2021-11-22 DIAGNOSIS — M549 Dorsalgia, unspecified: Secondary | ICD-10-CM | POA: Diagnosis not present

## 2021-11-22 DIAGNOSIS — C787 Secondary malignant neoplasm of liver and intrahepatic bile duct: Secondary | ICD-10-CM | POA: Diagnosis not present

## 2021-11-22 DIAGNOSIS — I447 Left bundle-branch block, unspecified: Secondary | ICD-10-CM | POA: Diagnosis not present

## 2021-11-22 DIAGNOSIS — C7989 Secondary malignant neoplasm of other specified sites: Secondary | ICD-10-CM | POA: Diagnosis not present

## 2021-11-22 DIAGNOSIS — C7800 Secondary malignant neoplasm of unspecified lung: Secondary | ICD-10-CM | POA: Diagnosis not present

## 2021-11-23 ENCOUNTER — Ambulatory Visit: Payer: Medicare HMO | Admitting: Internal Medicine

## 2021-11-23 DIAGNOSIS — N3091 Cystitis, unspecified with hematuria: Secondary | ICD-10-CM | POA: Diagnosis not present

## 2021-11-23 DIAGNOSIS — R531 Weakness: Secondary | ICD-10-CM | POA: Diagnosis not present

## 2021-11-23 DIAGNOSIS — R9431 Abnormal electrocardiogram [ECG] [EKG]: Secondary | ICD-10-CM | POA: Diagnosis not present

## 2021-11-23 DIAGNOSIS — I10 Essential (primary) hypertension: Secondary | ICD-10-CM | POA: Diagnosis not present

## 2021-11-23 DIAGNOSIS — I4891 Unspecified atrial fibrillation: Secondary | ICD-10-CM | POA: Diagnosis not present

## 2021-11-23 DIAGNOSIS — I447 Left bundle-branch block, unspecified: Secondary | ICD-10-CM | POA: Diagnosis not present

## 2021-11-23 DIAGNOSIS — K5900 Constipation, unspecified: Secondary | ICD-10-CM | POA: Diagnosis not present

## 2021-11-23 DIAGNOSIS — M549 Dorsalgia, unspecified: Secondary | ICD-10-CM | POA: Diagnosis not present

## 2021-11-23 DIAGNOSIS — R339 Retention of urine, unspecified: Secondary | ICD-10-CM | POA: Diagnosis not present

## 2021-11-24 ENCOUNTER — Ambulatory Visit: Payer: Medicare HMO | Admitting: Physician Assistant

## 2021-11-24 ENCOUNTER — Telehealth: Payer: Self-pay

## 2021-11-24 DIAGNOSIS — R591 Generalized enlarged lymph nodes: Secondary | ICD-10-CM | POA: Diagnosis not present

## 2021-11-24 DIAGNOSIS — M545 Low back pain, unspecified: Secondary | ICD-10-CM | POA: Diagnosis not present

## 2021-11-24 DIAGNOSIS — R918 Other nonspecific abnormal finding of lung field: Secondary | ICD-10-CM | POA: Diagnosis not present

## 2021-11-24 DIAGNOSIS — R339 Retention of urine, unspecified: Secondary | ICD-10-CM | POA: Diagnosis not present

## 2021-11-24 DIAGNOSIS — K5900 Constipation, unspecified: Secondary | ICD-10-CM | POA: Diagnosis not present

## 2021-11-24 DIAGNOSIS — N3091 Cystitis, unspecified with hematuria: Secondary | ICD-10-CM | POA: Diagnosis not present

## 2021-11-24 DIAGNOSIS — N179 Acute kidney failure, unspecified: Secondary | ICD-10-CM | POA: Diagnosis not present

## 2021-11-24 DIAGNOSIS — R601 Generalized edema: Secondary | ICD-10-CM | POA: Diagnosis not present

## 2021-11-24 NOTE — Telephone Encounter (Signed)
-----   Message from Debroah Loop, [place] sent at 11/24/2021 12:13 PM EST ----- Please switch him to Cipro 250mg  BID x7 days ----- Message ----- From: Interface, [facility] Lab Results In Sent: 11/21/2021  11:36 AM EST To: Debroah Loop, PA-C

## 2021-11-24 NOTE — Telephone Encounter (Signed)
Per Aldona Bar, pt is admitted to [facility], pt's ABX have already been switched.

## 2021-11-25 DIAGNOSIS — R339 Retention of urine, unspecified: Secondary | ICD-10-CM | POA: Diagnosis not present

## 2021-11-25 DIAGNOSIS — C801 Malignant (primary) neoplasm, unspecified: Secondary | ICD-10-CM | POA: Diagnosis not present

## 2021-11-25 DIAGNOSIS — N179 Acute kidney failure, unspecified: Secondary | ICD-10-CM | POA: Diagnosis not present

## 2021-11-25 DIAGNOSIS — N390 Urinary tract infection, site not specified: Secondary | ICD-10-CM | POA: Diagnosis not present

## 2021-11-26 ENCOUNTER — Other Ambulatory Visit: Payer: Self-pay | Admitting: Internal Medicine

## 2021-11-26 DIAGNOSIS — N4282 Prostatosis syndrome: Secondary | ICD-10-CM

## 2021-11-27 LAB — CULTURE, URINE COMPREHENSIVE

## 2021-12-07 ENCOUNTER — Ambulatory Visit: Payer: Medicare HMO | Admitting: Urology

## 2021-12-15 ENCOUNTER — Telehealth: Payer: Self-pay | Admitting: *Deleted

## 2021-12-15 NOTE — Telephone Encounter (Signed)
Received call from [place] from [facility] regarding a UA result. Mitzie reports patient is lethargic with cloudy urine, denies any urinary symptoms. Patient had foley changed 3 days ago. Reports patient has been placed on morphine and is receiving end of life care. Per Debroah Loop, PA from a urology standpoint antibiotic is not appropriate.

## 2021-12-23 ENCOUNTER — Other Ambulatory Visit: Payer: Self-pay | Admitting: Internal Medicine

## 2022-01-18 DEATH — deceased

## 2022-01-19 ENCOUNTER — Other Ambulatory Visit: Payer: Medicare HMO

## 2022-01-19 ENCOUNTER — Ambulatory Visit: Payer: Medicare HMO | Admitting: Nurse Practitioner

## 2022-01-19 ENCOUNTER — Ambulatory Visit: Payer: Medicare HMO

## 2022-05-02 IMAGING — CT CT ABD-PELV W/ CM
2 of 5 series · 15 of 46 positions shown, 17 images · IV contrast (APPLIED)
Comparison: None.

CLINICAL DATA: Right lower quadrant pain that started today.

EXAM:
CT ABDOMEN AND PELVIS WITH CONTRAST
TECHNIQUE: Multidetector CT imaging of the abdomen and pelvis was performed
using the standard protocol following bolus administration of
intravenous contrast.
CONTRAST:  100mL OMNIPAQUE IOHEXOL 300 MG/ML  SOLN

[Series 2: routine abd/pel with · axial · 0.93mm/px · z∈[-418,-34]mm · 12 of 87 slices shown, 14 images]
[im 5/87  soft-tissue]
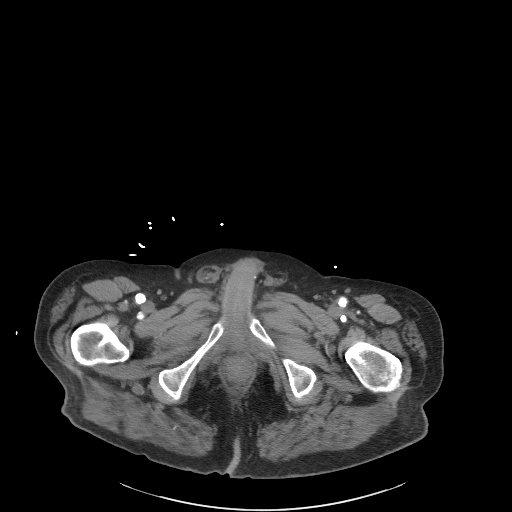
[im 5/87  bone]
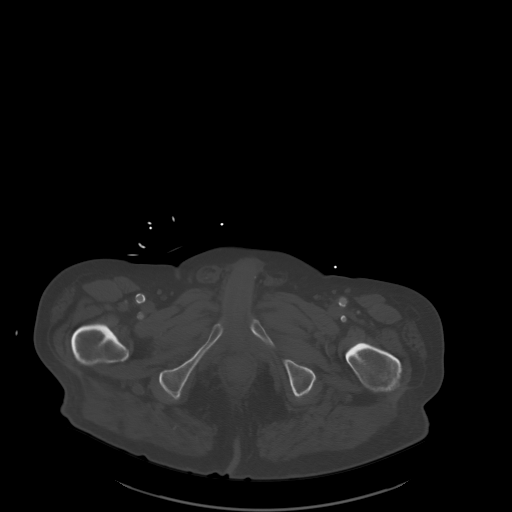
[im 15/87  soft-tissue]
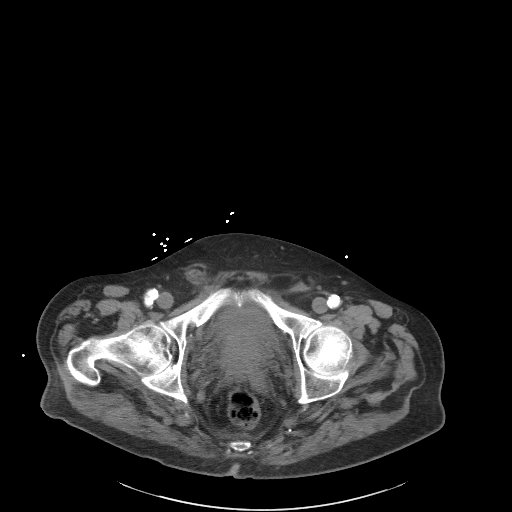
[im 20/87  soft-tissue]
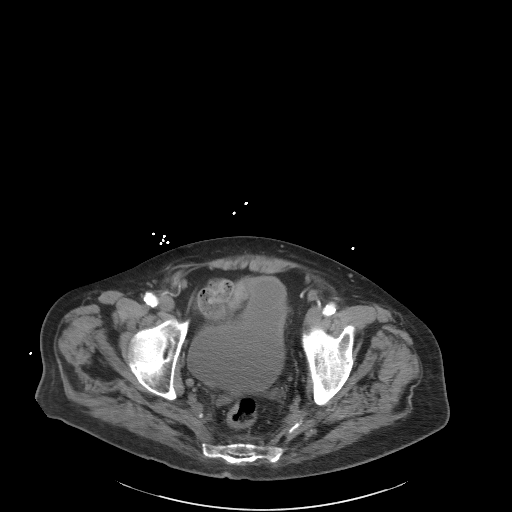
[im 24/87  soft-tissue]
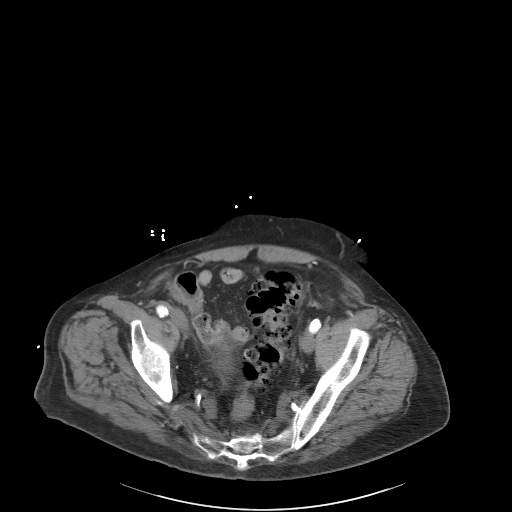
[im 34/87  soft-tissue]
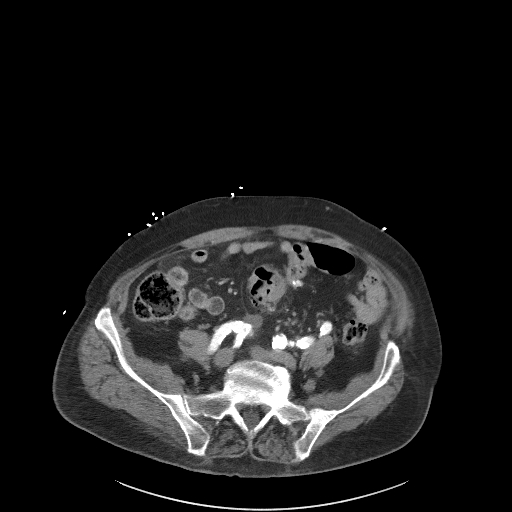
[im 39/87  soft-tissue]
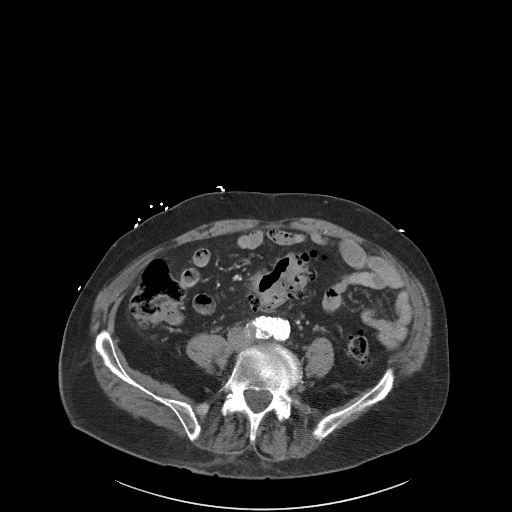
[im 48/87  soft-tissue]
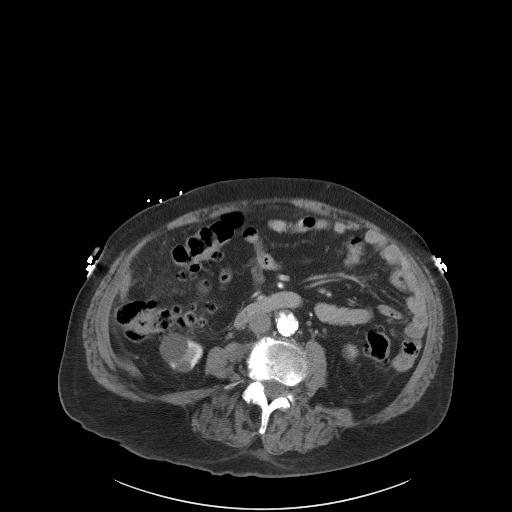
[im 53/87  soft-tissue]
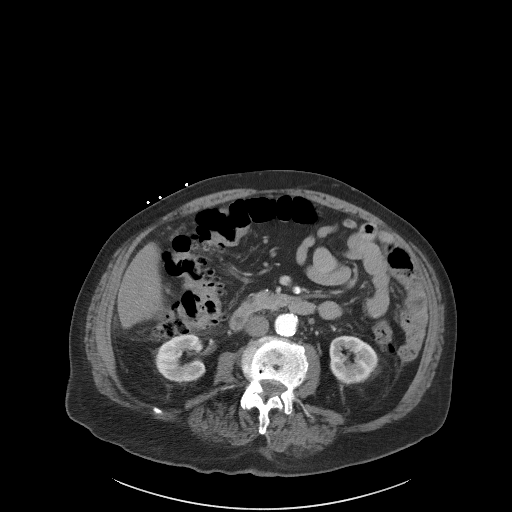
[im 63/87  soft-tissue]
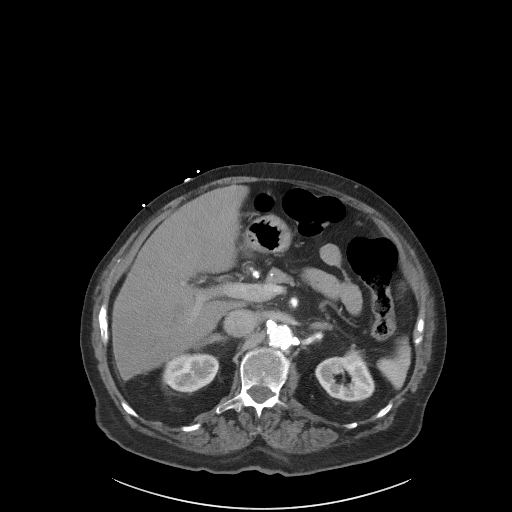
[im 63/87  bone]
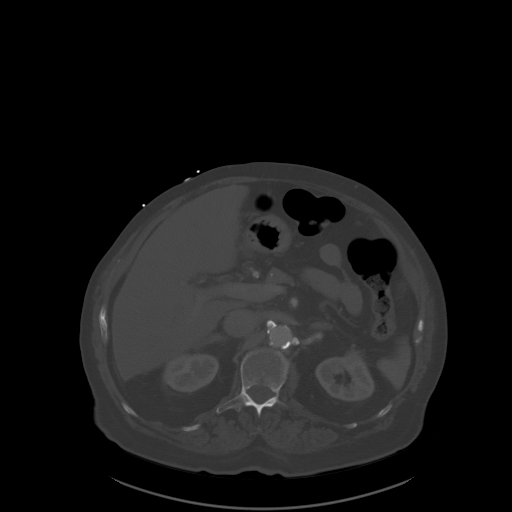
[im 67/87  soft-tissue]
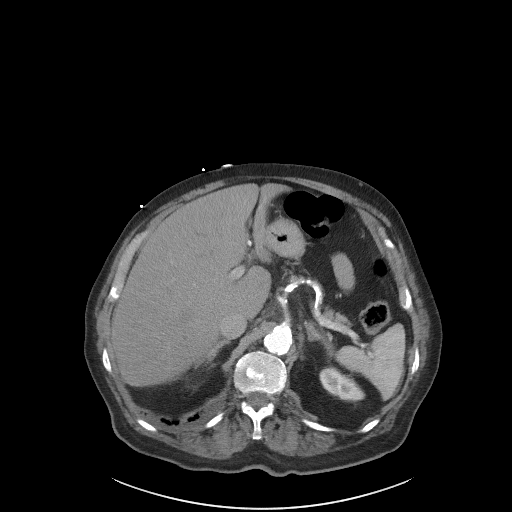
[im 72/87  soft-tissue]
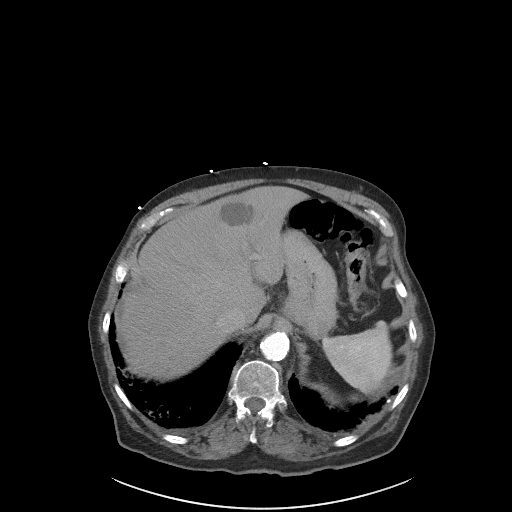
[im 82/87  soft-tissue]
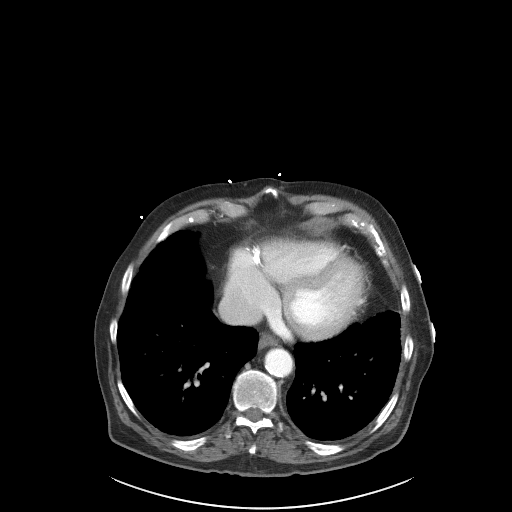

[Series 5: coronal st · coronal · 0.72mm/px · 3 of 99 slices shown]
[im 33/99  soft-tissue]
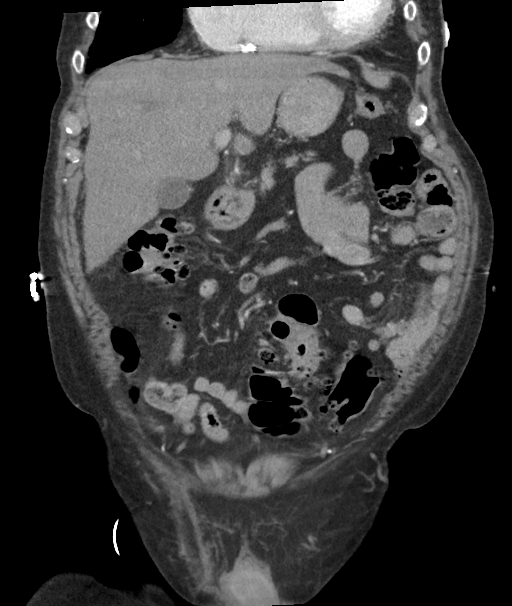
[im 44/99  soft-tissue]
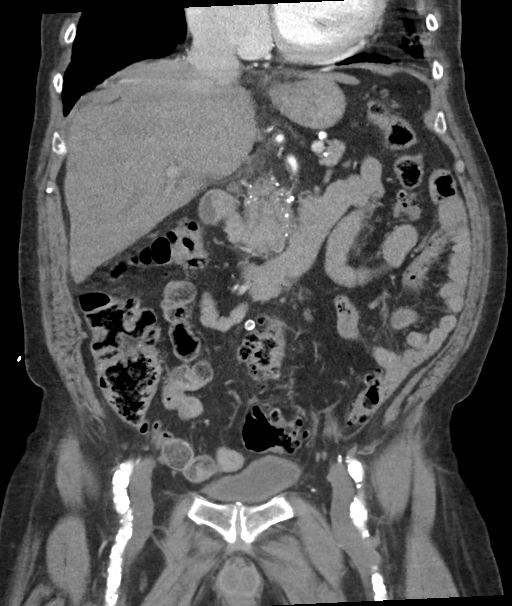
[im 55/99  soft-tissue]
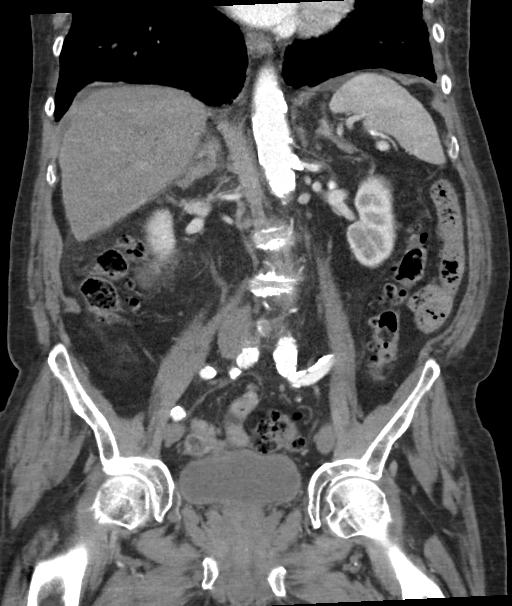

[15 of 46 positions shown; findings below may reference images not displayed]

FINDINGS: Lower chest: Interval development of dependent cystic changes at the
right base. Coronary artery calcifications.

Hepatobiliary: Similar-appearing multiloculated fluid density lesion
within the liver measuring simple fluid density and likely
representing a simple hepatic cyst. Redemonstration of several
subcentimeter/pericentimeter hypodensities are too small to
characterize. Gallbladder is unremarkable. No biliary ductal
dilatation.

Pancreas: Pancreatic parenchyma calcifications scattered. No focal
pancreatic lesion. Normal pancreatic contour with no definite
inflammatory changes surrounding it. No main pancreatic Normal in
size without focal abnormality. duct dilatation.

Spleen: Normal in size without focal abnormality.

Adrenals/Urinary Tract: No adrenal nodule bilaterally. Bilateral
kidneys enhance symmetrically. There is a 2.6 cm fluid density
lesion within the right kidney that likely represents a simple renal
cyst. No hydronephrosis. No hydroureter. The urinary bladder is
unremarkable.

Stomach/Bowel: Stomach is within normal limits. No evidence of small
bowel wall thickening or dilatation. Diffuse colonic diverticulosis.
Sigmoid bowel wall thickening with associated trace pericolonic fat
stranding. No intramural abscess formation. Appendix appears normal.

Vascular/Lymphatic: No abdominal aorta aneurysm. Aneurysmal
dilatation of bilateral common iliac arteries measuring up to 1.7 cm
bilaterally. Severe atherosclerotic plaque of the aorta and its
branches. No abdominal, pelvic, or inguinal lymphadenopathy.

Reproductive: Prostate is unremarkable.

Other: No intraperitoneal free fluid. No intraperitoneal free gas.
No organized fluid collection.

Musculoskeletal:

Small fat containing umbilical hernia with an abdominal wall defect
of 1.3 cm.

Levoscoliosis of the lumbar spine centered at the L3-L4 level. No
suspicious lytic or blastic osseous lesions. No acute displaced
fracture. Multilevel degenerative changes of the spine.
IMPRESSION: 1. Diffuse colonic diverticulosis with uncomplicated sigmoid
diverticulitis. No abscess formation or bowel perforation. Recommend
colonoscopy status post treatment and status post complete
resolution of inflammatory changes to evaluate for an underlying
lesion as a malignancy cannot be fully excluded.
2. Bilateral common iliac artery aneurysm measuring up to 1.7 cm.
3. Findings suggestive of chronic pancreatitis.
4.  Aortic Atherosclerosis (DAXT0-YSA.A).

## 2024-05-30 ENCOUNTER — Encounter: Payer: Self-pay | Admitting: Physician Assistant
